# Patient Record
Sex: Female | Born: 1980 | Race: White | Hispanic: No | Marital: Married | Smoking: Former smoker
Health system: Southern US, Community
[De-identification: ages and names within clinical notes are randomized; demographics above are authoritative.]

## PROBLEM LIST (undated history)

## (undated) DIAGNOSIS — G4730 Sleep apnea, unspecified: Secondary | ICD-10-CM

## (undated) DIAGNOSIS — I10 Essential (primary) hypertension: Secondary | ICD-10-CM

## (undated) DIAGNOSIS — E119 Type 2 diabetes mellitus without complications: Secondary | ICD-10-CM

## (undated) DIAGNOSIS — R7303 Prediabetes: Secondary | ICD-10-CM

## (undated) DIAGNOSIS — E785 Hyperlipidemia, unspecified: Secondary | ICD-10-CM

## (undated) DIAGNOSIS — Z9889 Other specified postprocedural states: Secondary | ICD-10-CM

## (undated) DIAGNOSIS — R51 Headache: Secondary | ICD-10-CM

## (undated) DIAGNOSIS — R109 Unspecified abdominal pain: Secondary | ICD-10-CM

## (undated) DIAGNOSIS — R112 Nausea with vomiting, unspecified: Secondary | ICD-10-CM

## (undated) DIAGNOSIS — F32A Depression, unspecified: Secondary | ICD-10-CM

## (undated) DIAGNOSIS — F419 Anxiety disorder, unspecified: Secondary | ICD-10-CM

## (undated) DIAGNOSIS — D759 Disease of blood and blood-forming organs, unspecified: Secondary | ICD-10-CM

## (undated) DIAGNOSIS — E669 Obesity, unspecified: Secondary | ICD-10-CM

## (undated) DIAGNOSIS — T8859XA Other complications of anesthesia, initial encounter: Secondary | ICD-10-CM

## (undated) DIAGNOSIS — D649 Anemia, unspecified: Secondary | ICD-10-CM

## (undated) DIAGNOSIS — F329 Major depressive disorder, single episode, unspecified: Secondary | ICD-10-CM

## (undated) DIAGNOSIS — Z862 Personal history of diseases of the blood and blood-forming organs and certain disorders involving the immune mechanism: Secondary | ICD-10-CM

## (undated) DIAGNOSIS — M1990 Unspecified osteoarthritis, unspecified site: Secondary | ICD-10-CM

## (undated) HISTORY — DX: Hyperlipidemia, unspecified: E78.5

## (undated) HISTORY — PX: WISDOM TOOTH EXTRACTION: SHX21

## (undated) HISTORY — PX: OTHER SURGICAL HISTORY: SHX169

## (undated) HISTORY — DX: Obesity, unspecified: E66.9

## (undated) HISTORY — PX: ANKLE ARTHROSCOPY WITH REPAIR SUBLUXING TENDON: SHX5584

## (undated) HISTORY — PX: ABDOMINAL HYSTERECTOMY: SHX81

## (undated) HISTORY — PX: NASAL SEPTUM SURGERY: SHX37

## (undated) HISTORY — PX: TUBAL LIGATION: SHX77

---
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
## 2000-07-01 ENCOUNTER — Encounter: Payer: Self-pay | Admitting: Emergency Medicine

## 2000-07-01 ENCOUNTER — Emergency Department (HOSPITAL_COMMUNITY): Admission: EM | Admit: 2000-07-01 | Discharge: 2000-07-01 | Payer: Self-pay | Admitting: Emergency Medicine

## 2000-10-11 ENCOUNTER — Encounter: Payer: Self-pay | Admitting: Emergency Medicine

## 2000-10-11 ENCOUNTER — Emergency Department (HOSPITAL_COMMUNITY): Admission: EM | Admit: 2000-10-11 | Discharge: 2000-10-11 | Payer: Self-pay | Admitting: Emergency Medicine

## 2002-05-02 HISTORY — PX: DILATION AND CURETTAGE OF UTERUS: SHX78

## 2003-12-31 ENCOUNTER — Emergency Department (HOSPITAL_COMMUNITY): Admission: EM | Admit: 2003-12-31 | Discharge: 2003-12-31 | Payer: Self-pay | Admitting: Emergency Medicine

## 2004-07-16 ENCOUNTER — Emergency Department (HOSPITAL_COMMUNITY): Admission: EM | Admit: 2004-07-16 | Discharge: 2004-07-16 | Payer: Self-pay | Admitting: Emergency Medicine

## 2004-10-18 ENCOUNTER — Emergency Department (HOSPITAL_COMMUNITY): Admission: EM | Admit: 2004-10-18 | Discharge: 2004-10-18 | Payer: Self-pay | Admitting: Family Medicine

## 2005-11-07 ENCOUNTER — Emergency Department (HOSPITAL_COMMUNITY): Admission: EM | Admit: 2005-11-07 | Discharge: 2005-11-07 | Payer: Self-pay | Admitting: Family Medicine

## 2006-05-02 HISTORY — PX: FOOT SURGERY: SHX648

## 2006-07-09 ENCOUNTER — Emergency Department (HOSPITAL_COMMUNITY): Admission: EM | Admit: 2006-07-09 | Discharge: 2006-07-09 | Payer: Self-pay | Admitting: Emergency Medicine

## 2007-03-04 ENCOUNTER — Emergency Department (HOSPITAL_COMMUNITY): Admission: EM | Admit: 2007-03-04 | Discharge: 2007-03-05 | Payer: Self-pay | Admitting: Emergency Medicine

## 2007-03-21 ENCOUNTER — Emergency Department (HOSPITAL_COMMUNITY): Admission: EM | Admit: 2007-03-21 | Discharge: 2007-03-21 | Payer: Self-pay | Admitting: Emergency Medicine

## 2007-06-18 ENCOUNTER — Emergency Department (HOSPITAL_COMMUNITY): Admission: EM | Admit: 2007-06-18 | Discharge: 2007-06-18 | Payer: Self-pay | Admitting: Family Medicine

## 2007-09-30 ENCOUNTER — Inpatient Hospital Stay (HOSPITAL_COMMUNITY): Admission: AD | Admit: 2007-09-30 | Discharge: 2007-09-30 | Payer: Self-pay | Admitting: Obstetrics

## 2007-10-16 ENCOUNTER — Ambulatory Visit: Payer: Self-pay | Admitting: Oncology

## 2007-11-12 LAB — CBC WITH DIFFERENTIAL/PLATELET
Basophils Absolute: 0.1 10*3/uL (ref 0.0–0.1)
Eosinophils Absolute: 0.1 10*3/uL (ref 0.0–0.5)
HCT: 40.4 % (ref 34.8–46.6)
LYMPH%: 16.3 % (ref 14.0–48.0)
MCV: 79.4 fL — ABNORMAL LOW (ref 81.0–101.0)
MONO%: 4.8 % (ref 0.0–13.0)
NEUT#: 10.5 10*3/uL — ABNORMAL HIGH (ref 1.5–6.5)
NEUT%: 77.7 % — ABNORMAL HIGH (ref 39.6–76.8)
Platelets: 254 10*3/uL (ref 145–400)
RBC: 5.09 10*6/uL (ref 3.70–5.32)

## 2007-11-12 LAB — APTT: aPTT: 34 seconds (ref 24–37)

## 2007-11-15 LAB — COMPREHENSIVE METABOLIC PANEL
ALT: 13 U/L (ref 0–35)
Albumin: 4.1 g/dL (ref 3.5–5.2)
CO2: 22 mEq/L (ref 19–32)
Calcium: 9.1 mg/dL (ref 8.4–10.5)
Chloride: 104 mEq/L (ref 96–112)
Glucose, Bld: 156 mg/dL — ABNORMAL HIGH (ref 70–99)
Potassium: 3.8 mEq/L (ref 3.5–5.3)
Sodium: 140 mEq/L (ref 135–145)
Total Protein: 6.6 g/dL (ref 6.0–8.3)

## 2007-11-15 LAB — SEDIMENTATION RATE: Sed Rate: 10 mm/hr (ref 0–22)

## 2007-11-15 LAB — LUPUS ANTICOAGULANT PANEL: Lupus Anticoagulant: DETECTED

## 2007-11-15 LAB — BETA-2 GLYCOPROTEIN ANTIBODIES
Beta-2 Glyco I IgG: <4 U/mL (ref <20)
Beta-2-Glycoprotein I IgM: <4 U/mL (ref <10)

## 2007-12-14 ENCOUNTER — Ambulatory Visit: Payer: Self-pay | Admitting: Oncology

## 2008-09-05 ENCOUNTER — Emergency Department (HOSPITAL_COMMUNITY): Admission: EM | Admit: 2008-09-05 | Discharge: 2008-09-05 | Payer: Self-pay | Admitting: Emergency Medicine

## 2008-10-31 ENCOUNTER — Ambulatory Visit: Payer: Self-pay | Admitting: Oncology

## 2008-10-31 LAB — CBC WITH DIFFERENTIAL/PLATELET
EOS%: 0.8 % (ref 0.0–7.0)
MCH: 27.2 pg (ref 25.1–34.0)
MCHC: 33.9 g/dL (ref 31.5–36.0)
MCV: 80.3 fL (ref 79.5–101.0)
MONO%: 6.4 % (ref 0.0–14.0)
RBC: 4.8 10*6/uL (ref 3.70–5.45)
RDW: 13.7 % (ref 11.2–14.5)

## 2008-10-31 LAB — COMPREHENSIVE METABOLIC PANEL
AST: 17 U/L (ref 0–37)
Albumin: 3.4 g/dL — ABNORMAL LOW (ref 3.5–5.2)
Alkaline Phosphatase: 57 U/L (ref 39–117)
Potassium: 3.3 mEq/L — ABNORMAL LOW (ref 3.5–5.3)
Sodium: 137 mEq/L (ref 135–145)
Total Protein: 6.3 g/dL (ref 6.0–8.3)

## 2008-10-31 LAB — PROTHROMBIN TIME: Prothrombin Time: 13.3 seconds (ref 11.6–15.2)

## 2008-11-05 LAB — LUPUS ANTICOAGULANT PANEL

## 2008-12-14 ENCOUNTER — Inpatient Hospital Stay (HOSPITAL_COMMUNITY): Admission: AD | Admit: 2008-12-14 | Discharge: 2008-12-14 | Payer: Self-pay | Admitting: Obstetrics & Gynecology

## 2008-12-14 ENCOUNTER — Ambulatory Visit: Payer: Self-pay | Admitting: Family

## 2009-04-10 ENCOUNTER — Inpatient Hospital Stay (HOSPITAL_COMMUNITY): Admission: AD | Admit: 2009-04-10 | Discharge: 2009-04-10 | Payer: Self-pay | Admitting: Obstetrics and Gynecology

## 2009-04-10 ENCOUNTER — Encounter: Payer: Self-pay | Admitting: Pulmonary Disease

## 2009-04-17 ENCOUNTER — Ambulatory Visit: Payer: Self-pay | Admitting: Pulmonary Disease

## 2009-04-17 DIAGNOSIS — R0602 Shortness of breath: Secondary | ICD-10-CM | POA: Insufficient documentation

## 2009-04-17 DIAGNOSIS — O99810 Abnormal glucose complicating pregnancy: Secondary | ICD-10-CM

## 2009-04-21 ENCOUNTER — Encounter: Admission: RE | Admit: 2009-04-21 | Discharge: 2009-04-29 | Payer: Self-pay | Admitting: Obstetrics and Gynecology

## 2009-06-19 ENCOUNTER — Inpatient Hospital Stay (HOSPITAL_COMMUNITY): Admission: AD | Admit: 2009-06-19 | Discharge: 2009-06-23 | Payer: Self-pay | Admitting: Obstetrics and Gynecology

## 2009-11-08 ENCOUNTER — Ambulatory Visit: Payer: Self-pay | Admitting: Nurse Practitioner

## 2009-11-08 ENCOUNTER — Inpatient Hospital Stay (HOSPITAL_COMMUNITY): Admission: AD | Admit: 2009-11-08 | Discharge: 2009-11-08 | Payer: Self-pay | Admitting: Obstetrics and Gynecology

## 2009-12-04 ENCOUNTER — Encounter: Admission: RE | Admit: 2009-12-04 | Discharge: 2009-12-04 | Payer: Self-pay | Admitting: Gastroenterology

## 2010-01-26 ENCOUNTER — Emergency Department (HOSPITAL_COMMUNITY): Admission: EM | Admit: 2010-01-26 | Discharge: 2010-01-26 | Payer: Self-pay | Admitting: Emergency Medicine

## 2010-05-03 ENCOUNTER — Emergency Department (HOSPITAL_COMMUNITY)
Admission: EM | Admit: 2010-05-03 | Discharge: 2010-05-03 | Payer: Self-pay | Source: Home / Self Care | Admitting: Family Medicine

## 2010-07-15 LAB — POCT CARDIAC MARKERS: Troponin i, poc: <0.05 ng/mL (ref 0.00–0.09)

## 2010-07-18 LAB — GC/CHLAMYDIA PROBE AMP, GENITAL: GC Probe Amp, Genital: NEGATIVE

## 2010-07-18 LAB — CBC
HCT: 37.3 % (ref 36.0–46.0)
MCV: 76 fL — ABNORMAL LOW (ref 78.0–100.0)

## 2010-07-18 LAB — WET PREP, GENITAL
Clue Cells Wet Prep HPF POC: NONE SEEN
Trich, Wet Prep: NONE SEEN

## 2010-07-18 LAB — URINALYSIS, ROUTINE W REFLEX MICROSCOPIC
Ketones, ur: NEGATIVE mg/dL
Specific Gravity, Urine: >1.03 — ABNORMAL HIGH (ref 1.005–1.030)
Urobilinogen, UA: 0.2 mg/dL (ref 0.0–1.0)

## 2010-07-18 LAB — POCT PREGNANCY, URINE: Preg Test, Ur: NEGATIVE

## 2010-07-21 LAB — BASIC METABOLIC PANEL
BUN: 14 mg/dL (ref 6–23)
CO2: 20 mEq/L (ref 19–32)
Calcium: 9 mg/dL (ref 8.4–10.5)
Chloride: 110 mEq/L (ref 96–112)
Creatinine, Ser: 0.51 mg/dL (ref 0.4–1.2)
GFR calc Af Amer: >60 mL/min (ref >60)
GFR calc non Af Amer: >60 mL/min (ref >60)
Glucose, Bld: 99 mg/dL (ref 70–99)
Potassium: 4 mEq/L (ref 3.5–5.1)
Sodium: 137 mEq/L (ref 135–145)

## 2010-07-21 LAB — CBC
HCT: 40.7 % (ref 36.0–46.0)
Hemoglobin: 13.4 g/dL (ref 12.0–15.0)
MCHC: 32.8 g/dL (ref 30.0–36.0)
MCV: 81.7 fL (ref 78.0–100.0)
Platelets: 136 10*3/uL — ABNORMAL LOW (ref 150–400)
Platelets: 197 10*3/uL (ref 150–400)
RBC: 4.09 MIL/uL (ref 3.87–5.11)
RBC: 4.98 MIL/uL (ref 3.87–5.11)
RDW: 15.5 % (ref 11.5–15.5)
WBC: 11.5 10*3/uL — ABNORMAL HIGH (ref 4.0–10.5)
WBC: 9.8 10*3/uL (ref 4.0–10.5)

## 2010-07-21 LAB — GLUCOSE, CAPILLARY
Glucose-Capillary: 76 mg/dL (ref 70–99)
Glucose-Capillary: 88 mg/dL (ref 70–99)
Glucose-Capillary: 91 mg/dL (ref 70–99)
Glucose-Capillary: 93 mg/dL (ref 70–99)

## 2010-07-21 LAB — RPR: RPR Ser Ql: NONREACTIVE

## 2010-08-03 LAB — COMPREHENSIVE METABOLIC PANEL
ALT: 19 U/L (ref 0–35)
Alkaline Phosphatase: 89 U/L (ref 39–117)
CO2: 22 mEq/L (ref 19–32)
Chloride: 103 mEq/L (ref 96–112)
GFR calc non Af Amer: >60 mL/min (ref >60)
Glucose, Bld: 86 mg/dL (ref 70–99)
Potassium: 3.7 mEq/L (ref 3.5–5.1)
Sodium: 136 mEq/L (ref 135–145)
Total Bilirubin: 0.2 mg/dL — ABNORMAL LOW (ref 0.3–1.2)
Total Protein: 6.1 g/dL (ref 6.0–8.3)

## 2010-08-03 LAB — CBC
HCT: 35.9 % — ABNORMAL LOW (ref 36.0–46.0)
Hemoglobin: 11.9 g/dL — ABNORMAL LOW (ref 12.0–15.0)
RBC: 4.35 MIL/uL (ref 3.87–5.11)

## 2010-08-03 LAB — D-DIMER, QUANTITATIVE: D-Dimer, Quant: 0.6 ug/mL-FEU — ABNORMAL HIGH (ref 0.00–0.48)

## 2010-08-06 ENCOUNTER — Emergency Department (HOSPITAL_COMMUNITY)
Admission: EM | Admit: 2010-08-06 | Discharge: 2010-08-06 | Disposition: A | Discharge status: Home / Self Care | Payer: BC Managed Care – PPO | Attending: Emergency Medicine | Admitting: Emergency Medicine

## 2010-08-06 DIAGNOSIS — K439 Ventral hernia without obstruction or gangrene: Secondary | ICD-10-CM | POA: Insufficient documentation

## 2010-08-06 DIAGNOSIS — R1033 Periumbilical pain: Secondary | ICD-10-CM | POA: Insufficient documentation

## 2010-08-06 LAB — URINALYSIS, ROUTINE W REFLEX MICROSCOPIC
Bilirubin Urine: NEGATIVE
Glucose, UA: NEGATIVE mg/dL
Hgb urine dipstick: NEGATIVE
Protein, ur: NEGATIVE mg/dL

## 2010-08-10 LAB — COMPREHENSIVE METABOLIC PANEL
Albumin: 3.3 g/dL — ABNORMAL LOW (ref 3.5–5.2)
BUN: 5 mg/dL — ABNORMAL LOW (ref 6–23)
CO2: 26 mEq/L (ref 19–32)
Chloride: 110 mEq/L (ref 96–112)
Creatinine, Ser: 0.65 mg/dL (ref 0.4–1.2)
GFR calc non Af Amer: >60 mL/min (ref >60)
Total Bilirubin: 0.4 mg/dL (ref 0.3–1.2)

## 2010-08-10 LAB — URINALYSIS, ROUTINE W REFLEX MICROSCOPIC
Nitrite: NEGATIVE
Specific Gravity, Urine: 1.019 (ref 1.005–1.030)
pH: 7 (ref 5.0–8.0)

## 2010-08-10 LAB — CBC
HCT: 39.6 % (ref 36.0–46.0)
MCV: 81 fL (ref 78.0–100.0)
Platelets: 203 10*3/uL (ref 150–400)
WBC: 9.6 10*3/uL (ref 4.0–10.5)

## 2010-08-10 LAB — DIFFERENTIAL
Basophils Absolute: 0.1 10*3/uL (ref 0.0–0.1)
Lymphocytes Relative: 21 % (ref 12–46)
Neutro Abs: 6.8 10*3/uL (ref 1.7–7.7)

## 2010-08-10 LAB — POCT CARDIAC MARKERS: Myoglobin, poc: 42.9 ng/mL (ref 12–200)

## 2010-08-12 ENCOUNTER — Other Ambulatory Visit: Payer: Self-pay | Admitting: General Surgery

## 2010-08-17 ENCOUNTER — Ambulatory Visit
Admission: RE | Admit: 2010-08-17 | Discharge: 2010-08-17 | Disposition: A | Discharge status: Home / Self Care | Payer: BC Managed Care – PPO | Source: Ambulatory Visit | Attending: General Surgery | Admitting: General Surgery

## 2010-08-17 MED ORDER — IOHEXOL 300 MG/ML  SOLN
150.0000 mL | Freq: Once | INTRAMUSCULAR | Status: AC | PRN
  Administered 2010-08-17: 150 mL via INTRAVENOUS

## 2010-08-25 LAB — CBC
MCH: 23.8 pg — ABNORMAL LOW (ref 26.0–34.0)
MCV: 72.6 fL — ABNORMAL LOW (ref 78.0–100.0)
Platelets: 259 10*3/uL (ref 150–400)
RBC: 5.43 MIL/uL — ABNORMAL HIGH (ref 3.87–5.11)

## 2010-08-25 LAB — DIFFERENTIAL
Eosinophils Absolute: 0.3 10*3/uL (ref 0.0–0.7)
Eosinophils Relative: 2 % (ref 0–5)
Lymphs Abs: 2.6 10*3/uL (ref 0.7–4.0)
Monocytes Relative: 6 % (ref 3–12)
Neutrophils Relative %: 76 % (ref 43–77)

## 2010-08-25 LAB — BASIC METABOLIC PANEL
BUN: 7 mg/dL (ref 6–23)
Chloride: 106 mEq/L (ref 96–112)
Creatinine, Ser: 0.66 mg/dL (ref 0.4–1.2)
Glucose, Bld: 110 mg/dL — ABNORMAL HIGH (ref 70–99)

## 2010-08-25 LAB — HCG, SERUM, QUALITATIVE: Preg, Serum: NEGATIVE

## 2010-08-27 ENCOUNTER — Encounter (HOSPITAL_COMMUNITY)
Admission: RE | Admit: 2010-08-27 | Discharge: 2010-08-27 | Disposition: A | Discharge status: Home / Self Care | Payer: BC Managed Care – PPO | Source: Ambulatory Visit | Attending: General Surgery | Admitting: General Surgery

## 2010-08-27 DIAGNOSIS — Z01812 Encounter for preprocedural laboratory examination: Secondary | ICD-10-CM | POA: Insufficient documentation

## 2010-08-27 LAB — CBC
HCT: 40.6 % (ref 36.0–46.0)
Hemoglobin: 12.4 g/dL (ref 12.0–15.0)
MCH: 22.1 pg — ABNORMAL LOW (ref 26.0–34.0)
MCV: 72.5 fL — ABNORMAL LOW (ref 78.0–100.0)
Platelets: 288 10*3/uL (ref 150–400)
RBC: 5.6 MIL/uL — ABNORMAL HIGH (ref 3.87–5.11)

## 2010-08-27 LAB — URINALYSIS, ROUTINE W REFLEX MICROSCOPIC
Bilirubin Urine: NEGATIVE
Glucose, UA: NEGATIVE mg/dL
Hgb urine dipstick: NEGATIVE
Ketones, ur: NEGATIVE mg/dL
Protein, ur: NEGATIVE mg/dL
pH: 6 (ref 5.0–8.0)

## 2010-08-28 LAB — URINE CULTURE
Colony Count: >=100000
Culture  Setup Time: 201204271233

## 2010-08-30 ENCOUNTER — Ambulatory Visit (HOSPITAL_COMMUNITY): Admission: RE | Admit: 2010-08-30 | Payer: BC Managed Care – PPO | Source: Ambulatory Visit | Admitting: General Surgery

## 2010-09-02 ENCOUNTER — Other Ambulatory Visit (HOSPITAL_COMMUNITY): Payer: Self-pay | Admitting: Oncology

## 2010-09-02 ENCOUNTER — Encounter (HOSPITAL_BASED_OUTPATIENT_CLINIC_OR_DEPARTMENT_OTHER): Payer: BC Managed Care – PPO | Admitting: Oncology

## 2010-09-02 ENCOUNTER — Ambulatory Visit
Admission: RE | Admit: 2010-09-02 | Discharge: 2010-09-02 | Disposition: A | Discharge status: Home / Self Care | Payer: BC Managed Care – PPO | Source: Ambulatory Visit | Attending: Family Medicine | Admitting: Family Medicine

## 2010-09-02 ENCOUNTER — Other Ambulatory Visit: Payer: Self-pay | Admitting: Family Medicine

## 2010-09-02 DIAGNOSIS — D72829 Elevated white blood cell count, unspecified: Secondary | ICD-10-CM

## 2010-09-02 DIAGNOSIS — N96 Recurrent pregnancy loss: Secondary | ICD-10-CM

## 2010-09-02 DIAGNOSIS — R0609 Other forms of dyspnea: Secondary | ICD-10-CM

## 2010-09-02 DIAGNOSIS — D6859 Other primary thrombophilia: Secondary | ICD-10-CM

## 2010-09-02 LAB — COMPREHENSIVE METABOLIC PANEL
Alkaline Phosphatase: 87 U/L (ref 39–117)
CO2: 20 mEq/L (ref 19–32)
Creatinine, Ser: 0.69 mg/dL (ref 0.40–1.20)
Glucose, Bld: 146 mg/dL — ABNORMAL HIGH (ref 70–99)
Total Bilirubin: 0.3 mg/dL (ref 0.3–1.2)

## 2010-09-02 LAB — LACTATE DEHYDROGENASE: LDH: 146 U/L (ref 94–250)

## 2010-09-02 LAB — CBC WITH DIFFERENTIAL/PLATELET
BASO%: 0.1 % (ref 0.0–2.0)
Eosinophils Absolute: 0.2 10*3/uL (ref 0.0–0.5)
LYMPH%: 13.2 % — ABNORMAL LOW (ref 14.0–49.7)
MCHC: 32.4 g/dL (ref 31.5–36.0)
MONO#: 0.7 10*3/uL (ref 0.1–0.9)
MONO%: 4.5 % (ref 0.0–14.0)
NEUT#: 13 10*3/uL — ABNORMAL HIGH (ref 1.5–6.5)
Platelets: 280 10*3/uL (ref 145–400)
RBC: 5.37 10*6/uL (ref 3.70–5.45)
RDW: 16.9 % — ABNORMAL HIGH (ref 11.2–14.5)
WBC: 16 10*3/uL — ABNORMAL HIGH (ref 3.9–10.3)

## 2010-09-02 LAB — CHCC SMEAR

## 2010-09-02 LAB — MORPHOLOGY: PLT EST: ADEQUATE

## 2010-09-04 LAB — IRON AND TIBC
%SAT: 5 % — ABNORMAL LOW (ref 20–55)
Iron: 16 ug/dL — ABNORMAL LOW (ref 42–145)
TIBC: 320 ug/dL (ref 250–470)
UIBC: 304 ug/dL

## 2010-09-04 LAB — FERRITIN: Ferritin: 20 ng/mL (ref 10–291)

## 2010-09-13 ENCOUNTER — Encounter (HOSPITAL_BASED_OUTPATIENT_CLINIC_OR_DEPARTMENT_OTHER): Payer: BC Managed Care – PPO | Admitting: Oncology

## 2010-09-13 DIAGNOSIS — D72829 Elevated white blood cell count, unspecified: Secondary | ICD-10-CM

## 2010-09-13 DIAGNOSIS — N96 Recurrent pregnancy loss: Secondary | ICD-10-CM

## 2010-09-14 ENCOUNTER — Other Ambulatory Visit: Payer: Self-pay | Admitting: General Surgery

## 2010-09-14 ENCOUNTER — Encounter (HOSPITAL_COMMUNITY): Payer: BC Managed Care – PPO

## 2010-09-14 LAB — BASIC METABOLIC PANEL
BUN: 11 mg/dL (ref 6–23)
CO2: 23 mEq/L (ref 19–32)
Calcium: 8.5 mg/dL (ref 8.4–10.5)
Creatinine, Ser: 0.55 mg/dL (ref 0.4–1.2)
Glucose, Bld: 97 mg/dL (ref 70–99)
Sodium: 138 mEq/L (ref 135–145)

## 2010-09-14 LAB — CBC
HCT: 40 % (ref 36.0–46.0)
MCH: 21.9 pg — ABNORMAL LOW (ref 26.0–34.0)
MCHC: 30 g/dL (ref 30.0–36.0)
MCV: 72.9 fL — ABNORMAL LOW (ref 78.0–100.0)
RDW: 16.3 % — ABNORMAL HIGH (ref 11.5–15.5)

## 2010-09-14 LAB — DIFFERENTIAL
Basophils Absolute: 0 10*3/uL (ref 0.0–0.1)
Lymphs Abs: 2.1 10*3/uL (ref 0.7–4.0)
Monocytes Absolute: 0.7 10*3/uL (ref 0.1–1.0)
Monocytes Relative: 5 % (ref 3–12)
Neutro Abs: 10 10*3/uL — ABNORMAL HIGH (ref 1.7–7.7)

## 2010-09-14 LAB — SURGICAL PCR SCREEN: Staphylococcus aureus: NEGATIVE

## 2010-09-20 ENCOUNTER — Ambulatory Visit (HOSPITAL_COMMUNITY)
Admission: RE | Admit: 2010-09-20 | Discharge: 2010-09-22 | Disposition: A | Discharge status: Home / Self Care | Payer: BC Managed Care – PPO | Source: Ambulatory Visit | Attending: General Surgery | Admitting: General Surgery

## 2010-09-20 DIAGNOSIS — F3289 Other specified depressive episodes: Secondary | ICD-10-CM | POA: Insufficient documentation

## 2010-09-20 DIAGNOSIS — F329 Major depressive disorder, single episode, unspecified: Secondary | ICD-10-CM | POA: Insufficient documentation

## 2010-09-20 DIAGNOSIS — Z79899 Other long term (current) drug therapy: Secondary | ICD-10-CM | POA: Insufficient documentation

## 2010-09-20 DIAGNOSIS — K439 Ventral hernia without obstruction or gangrene: Secondary | ICD-10-CM | POA: Insufficient documentation

## 2010-09-20 HISTORY — PX: VENTRAL HERNIA REPAIR: SHX424

## 2010-09-22 LAB — CBC
Hemoglobin: 11.3 g/dL — ABNORMAL LOW (ref 12.0–15.0)
MCH: 21.9 pg — ABNORMAL LOW (ref 26.0–34.0)
RBC: 5.15 MIL/uL — ABNORMAL HIGH (ref 3.87–5.11)

## 2010-09-22 LAB — BASIC METABOLIC PANEL
CO2: 28 mEq/L (ref 19–32)
Chloride: 105 mEq/L (ref 96–112)
Creatinine, Ser: 0.7 mg/dL (ref 0.4–1.2)
GFR calc Af Amer: >60 mL/min (ref >60)
Sodium: 139 mEq/L (ref 135–145)

## 2010-09-24 NOTE — Op Note (Signed)
NAMELUX, SKILTON NO.:  [id number]  MEDICAL RECORD NO.:  [id number]           PATIENT TYPE:  O  LOCATION:  1533                         FACILITY:  [facility]  PHYSICIAN:  Mary Sella. Andrey Campanile, MD     DATE OF BIRTH:  Feb 14, 1981  DATE OF PROCEDURE:  09/20/2010 DATE OF DISCHARGE:                              OPERATIVE REPORT   PREOPERATIVE DIAGNOSIS:  Ventral hernia.  POSTOPERATIVE DIAGNOSIS:  Ventral hernia.  PROCEDURE: 1. Laparoscopic ventral hernia repair with mesh. 2. Placed On-Q pain catheter.  SURGEON:  Mary Sella. Andrey Campanile, MD  ANESTHESIA:  General plus 15 cc of 0.25% Marcaine.  FINDINGS:  The patient had a fascial defect just above the level of the umbilicus, the inferior edge of the fascial defect included a portion of the umbilicus.  Therefore, I included the umbilicus as part of my fascial defect measurement.  The fascial defect measured 5 x 3.5. I then measured 5 cm circumferential to this to get an outline for the proposed mesh size.  The best size piece of mesh that would have accommodated the defect once added 5 cm circumferentially around it for overlap would be a 15 x 20 cm piece of Parietex.  There were 12 transfascial sutures.  I used Ethicon Secure Strap to secure the mesh to the abdominal wall as well.    EBL, minimal.  INDICATIONS FOR PROCEDURE:  The patient is a 30 year old morbidly obese Caucasian female who has been referred from the emergency room for evaluation of umbilical hernia.  On exam, it felt that it was small supraumbilical hernia.  Because of her abdominal size and morbid obesity, it was tough to delineate the edges of the fascial defect. Therefore, I obtained a CT scan which demonstrated a small supraumbilical hernia which measured about 3-4 cm on the CAT scan.  Her preoperative labs revealed a leukocytosis of around 18,000.  There were no signs of infection and there was just some omental fat within the hernia.  Therefore, she  underwent an outpatient workup for leukocytosis and has subsequently been cleared for surgery.  Her white count preoperatively was found to be 13.  We discussed the risks and benefits of surgery including bleeding, infection, injury to surrounding structures, DVT occurrence, hernia reoccurrence, prolonged abdominal wall pain due to the nature and how the mesh is anchored to the abdominal wall mesh, complications, urinary retention as well as wound infection.  She elected to proceed with surgery.  DESCRIPTION OF PROCEDURE:  After obtaining informed consent, the patient was brought to the operating room, placed supine on the operating table. General endotracheal anesthesia was established.  Sequential compression devices were placed, a Foley catheter was placed.  She received IV Tylenol as well as 2 g of Ancef prior to skin incision.  A surgical time- out was performed.  Her abdomen was then prepped and draped in usual standard surgical fashion with ChloraPrep.  I elected to gain entry to her abdomen using Optiview technique, starting 2 fingerbreadths below the left subcostal margin slightly to the left of the midclavicular line.  I made a 1 cm incision with  a #11 blade.  Then, using a 0 degree 5 mm scope through a 5 mm trocar, I advanced the trocar through all layers of the abdominal wall under direct visualization and smoothly entered the abdominal cavity. Pneumoperitoneum was established up to patient pressure of 15 mmHg. Laparoscope scope was advanced, the abdominal cavity was surveilled. She had a little bit of omental adhesions to her lower midline probably about 2 inches above her Pfannenstiel incision.  She had the umbilicus, then the fascial defect, as described above.  She had a very long falciform ligament.  I ended up placing another 11 mm trocar in the left midabdomen under direct visualization out laterally after local had been infiltrated.  Then using Harmonic scalpel, I  have lysed the adhesions, which were consisted of omentum to anterior uterine wall and the lower abdomen.  I then took down the falciform ligament in a serial fashion using the Harmonic scalpel.  Then using a spinal needle, I marked the boundaries of the fascial defect.  Once I decided to incorporate the umbilicus into the fascial defect because the fascia was somewhat thin there.  Again, the fascial defect of about 5 x 3.5 cm then measuring 5 cm circumferentially around that to get a 5 cm overlap and left in with about a 15 x 12 needed piece of mesh.  I, therefore, obtained a 15 x 20 cm piece of Parietex mesh. I placed 12 sutures, consisting of #1 Novafil through the Parietex leaving long tails.  I then rolled the Parietex like a cigar and put it through the 11 mm trocar and then rolled it into the abdominal cavity with appropriate orientation.  Then using a Storz Endo suture passer, I brought each of the tails of the suture that I had placed through the mesh up through the abdominal wall through the same skin defect, but through a separate fascial defect which was a couple of millimeters from each other.  This was done in a circumferential manner for the 12 transfascial sutures.  I then lifted up and snugged down all the 12 transfascial sutures against the abdominal wall.  The mesh was well seated against the abdominal wall.  There was very little redundancy.  I then tied down the transfascial sutures in a circumferential manner.  I then used almost a total of 2 Ethicon Secure Strap tackers to tack the periphery of the mesh to the abdominal wall placing a tack about every 1 cm.  I also placed a few tacks in the center, directly around the fascial defect around the umbilicus itself. I then placed two 7 inches On-Q pain catheters.  I made a small stab incision in the right upper quadrant and then tunneled it preperitoneal using laparoscopic guidance.  The sheath was left in place.  I  then removed the 5 mm trocar in the left upper quadrant and tunneled it through the skin defect that I made for the 5 mm trocar toward the left lower quadrant.  The catheters remain preperitoneal.  The sheath was left in place.  Pneumoperitoneum was released and the 11 mm trocar was removed.  I then placed each of the 7 inch pain catheters through their respective sheaths and then pulled the sheath apart leaving the pain catheter in place.  I closed the 11 mm trocar site with a #4-0 Monocryl in subcuticular fashion.  I placed a small 4-0 Monocryl in subcuticular fashion in left upper quadrant trocar site, just next to the On-Q pain catheter at  that site.  Dermabond was then applied to each of the 12 small nicks that had been made in an oval pattern on the patient's abdominal wall for the transvaginal sutures.  The On-Q pain catheters were secured to the skin with Steri-Strips.  Foley catheter was removed.  The patient was extubated and brought to the recovery room in stable condition.  There were no immediate complications.  The patient tolerated the procedure well.     Mary Sella. Andrey Campanile, MD     EMW/MEDQ  D:  09/20/2010  T:  09/21/2010  Job:  [id number]  cc:   Burnell Blanks, MD Fax: [phone]  Lenoard Aden, M.D. Fax: [phone]  Electronically Signed by Gaynelle Adu M.D. on 09/24/2010 09:21:36 AM

## 2010-09-29 ENCOUNTER — Encounter (INDEPENDENT_AMBULATORY_CARE_PROVIDER_SITE_OTHER): Payer: Self-pay | Admitting: General Surgery

## 2010-11-02 ENCOUNTER — Other Ambulatory Visit (HOSPITAL_COMMUNITY): Payer: Self-pay | Admitting: Oncology

## 2010-11-02 DIAGNOSIS — D539 Nutritional anemia, unspecified: Secondary | ICD-10-CM

## 2010-11-02 DIAGNOSIS — D6859 Other primary thrombophilia: Secondary | ICD-10-CM

## 2010-11-12 ENCOUNTER — Ambulatory Visit (INDEPENDENT_AMBULATORY_CARE_PROVIDER_SITE_OTHER): Payer: BC Managed Care – PPO | Admitting: General Surgery

## 2010-11-12 ENCOUNTER — Encounter (INDEPENDENT_AMBULATORY_CARE_PROVIDER_SITE_OTHER): Payer: Self-pay | Admitting: General Surgery

## 2010-11-12 DIAGNOSIS — K439 Ventral hernia without obstruction or gangrene: Secondary | ICD-10-CM

## 2010-11-12 NOTE — Progress Notes (Signed)
Procedure: Laparoscopic ventral hernia repair with mesh Sep 20, 2010  History of Present Ilness: The patient comes in today for her second postoperative visit. Since her last visit she states that she is returned to work and she's actually doing quite well. She reports minimal to no abdominal discomfort. She will occasionally have a sharp pain if she twists real suddenly. However that occurs very infrequently. She denies any nausea, vomiting, fever, or chills. Her only issue has been persistent loose stools. She has not tried anything to treat this.  Physical Exam: There were no vitals taken for this visit.  Well-developed well-nourished morbidly obese Caucasian female in no apparent distress Abdomen-soft, nontender, nondistended. Obese abdomen. Well healed trocar incisions. No evidence of hernia recurrence.  Pathology: N/A  Assessment and Plan: Status post laparoscopic ventral hernia repair with mesh doing well. I am not sure the etiology of her loose stools. However I do not think it warrants invasive imaging or laboratory evaluation at this time. I recommended that she had some fiber to her diet. We discussed these that were high in fiber. We also talked about supplemental fiber. She was given education material. I also recommended some occasional Imodium. I will see her in 6 weeks. I told her to call the office should her symptoms worsen.

## 2010-11-12 NOTE — Patient Instructions (Signed)
GETTING TO GOOD BOWEL HEALTH. Irregular bowel habits such as constipation and diarrhea can lead to many problems over time.  Having one soft bowel movement a day is the most important way to prevent further problems.  The anorectal canal is designed to handle stretching and feces to safely manage our ability to get rid of solid waste (feces, poop, stool) out of our body.  BUT, hard constipated stools can act like ripping concrete bricks and diarrhea can be a burning fire to this very sensitive area of our body, causing inflamed hemorrhoids, anal fissures, increasing risk is perirectal abscesses, abdominal pain/bloating, an making irritable bowel worse.     The goal: ONE SOFT BOWEL MOVEMENT A DAY!  To have soft, regular bowel movements:    Drink at least 8 tall glasses of water a day.     Take plenty of fiber.  Fiber is the undigested part of plant food that passes into the colon, acting s "natures broom" to encourage bowel motility and movement.  Fiber can absorb and hold large amounts of water. This results in a larger, bulkier stool, which is soft and easier to pass. Work gradually over several weeks up to 6 servings a day of fiber (25g a day even more if needed) in the form of: o Vegetables -- Root (potatoes, carrots, turnips), leafy green (lettuce, salad greens, celery, spinach), or cooked high residue (cabbage, broccoli, etc) o Fruit -- Fresh (unpeeled skin & pulp), Dried (prunes, apricots, cherries, etc ),  or stewed ( applesauce)  o Whole grain breads, pasta, etc (whole wheat)  o Bran cereals    Bulking Agents -- This type of water-retaining fiber generally is easily obtained each day by one of the following:  o Psyllium bran -- The psyllium plant is remarkable because its ground seeds can retain so much water. This product is available as Metamucil, Konsyl, Effersyllium, Per Diem Fiber, or the less expensive generic preparation in drug and health food stores. Although labeled a laxative, it really  is not a laxative.  o Methylcellulose -- This is another fiber derived from wood which also retains water. It is available as Citrucel. o Polyethylene Glycol - and "artificial" fiber commonly called Miralax or Glycolax.  It is helpful for people with gassy or bloated feelings with regular fiber o Flax Seed - a less gassy fiber than psyllium   No reading or other relaxing activity while on the toilet. If bowel movements take longer than 5 minutes, you are too constipated   AVOID CONSTIPATION.  High fiber and water intake usually takes care of this.  Sometimes a laxative is needed to stimulate more frequent bowel movements, but    Laxatives are not a good long-term solution as it can wear the colon out. o Osmotics (Milk of Magnesia, Fleets phosphosoda, Magnesium citrate, MiraLax, GoLytely) are safer than  o Stimulants (Senokot, Castor Oil, Dulcolax, Ex Lax)    o Do not take laxatives for more than 7days in a row.    IF SEVERELY CONSTIPATED, try a Bowel Retraining Program: o Do not use laxatives.  o Eat a diet high in roughage, such as bran cereals and leafy vegetables.  o Drink six (6) ounces of prune or apricot juice each morning.  o Eat two (2) large servings of stewed fruit each day.  o Take one (1) heaping tablespoon of a psyllium-based bulking agent twice a day. Use sugar-free sweetener when possible to avoid excessive calories.  o Eat a normal breakfast.  o   Set aside 15 minutes after breakfast to sit on the toilet, but do not strain to have a bowel movement.  o If you do not have a bowel movement by the third day, use an enema and repeat the above steps.    Controlling diarrhea o Switch to liquids and simpler foods for a few days to avoid stressing your intestines further. o Avoid dairy products (especially milk & ice cream) for a short time.  The intestines often can lose the ability to digest lactose when stressed. o Avoid foods that cause gassiness or bloating.  Typical foods include  beans and other legumes, cabbage, broccoli, and dairy foods.  Every person has some sensitivity to other foods, so listen to our body and avoid those foods that trigger problems for you. o Adding fiber (Citrucel, Metamucil, psyllium, Miralax) gradually can help thicken stools by absorbing excess fluid and retrain the intestines to act more normally.  Slowly increase the dose over a few weeks.  Too much fiber too soon can backfire and cause cramping & bloating. o Probiotics (such as active yogurt, Align, etc) may help repopulate the intestines and colon with normal bacteria and calm down a sensitive digestive tract.  Most studies show it to be of mild help, though, and such products can be costly. o Medicines:   Bismuth subsalicylate (ex. Kayopectate, Pepto Bismol) every 30 minutes for up to 6 doses can help control diarrhea.  Avoid if pregnant.   Loperamide (Immodium) can slow down diarrhea.  Start with two tablets (4mg total) first and then try one tablet every 6 hours.  Avoid if you are having fevers or severe pain.  If you are not better or start feeling worse, stop all medicines and call your doctor for advice o Call your doctor if you are getting worse or not better.  Sometimes further testing (cultures, endoscopy, X-ray studies, bloodwork, etc) may be needed to help diagnose and treat the cause of the diarrhea.' 

## 2010-11-17 ENCOUNTER — Emergency Department (HOSPITAL_COMMUNITY): Payer: BC Managed Care – PPO

## 2010-11-17 ENCOUNTER — Emergency Department (HOSPITAL_COMMUNITY)
Admission: EM | Admit: 2010-11-17 | Discharge: 2010-11-17 | Disposition: A | Discharge status: Home / Self Care | Payer: BC Managed Care – PPO | Attending: Emergency Medicine | Admitting: Emergency Medicine

## 2010-11-17 DIAGNOSIS — D72829 Elevated white blood cell count, unspecified: Secondary | ICD-10-CM | POA: Insufficient documentation

## 2010-11-17 DIAGNOSIS — F3289 Other specified depressive episodes: Secondary | ICD-10-CM | POA: Insufficient documentation

## 2010-11-17 DIAGNOSIS — F329 Major depressive disorder, single episode, unspecified: Secondary | ICD-10-CM | POA: Insufficient documentation

## 2010-11-17 DIAGNOSIS — R0789 Other chest pain: Secondary | ICD-10-CM | POA: Insufficient documentation

## 2010-11-17 DIAGNOSIS — R5381 Other malaise: Secondary | ICD-10-CM | POA: Insufficient documentation

## 2010-11-17 DIAGNOSIS — R55 Syncope and collapse: Secondary | ICD-10-CM | POA: Insufficient documentation

## 2010-11-17 DIAGNOSIS — R112 Nausea with vomiting, unspecified: Secondary | ICD-10-CM | POA: Insufficient documentation

## 2010-11-17 DIAGNOSIS — R42 Dizziness and giddiness: Secondary | ICD-10-CM | POA: Insufficient documentation

## 2010-11-17 DIAGNOSIS — Z79899 Other long term (current) drug therapy: Secondary | ICD-10-CM | POA: Insufficient documentation

## 2010-11-17 LAB — BASIC METABOLIC PANEL
CO2: 25 mEq/L (ref 19–32)
Calcium: 9.3 mg/dL (ref 8.4–10.5)
Chloride: 102 mEq/L (ref 96–112)
Sodium: 138 mEq/L (ref 135–145)

## 2010-11-17 LAB — CBC
Hemoglobin: 11.8 g/dL — ABNORMAL LOW (ref 12.0–15.0)
MCH: 22.2 pg — ABNORMAL LOW (ref 26.0–34.0)
MCV: 70.6 fL — ABNORMAL LOW (ref 78.0–100.0)
RBC: 5.31 MIL/uL — ABNORMAL HIGH (ref 3.87–5.11)

## 2010-11-17 LAB — TROPONIN I: Troponin I: <0.3 ng/mL (ref <0.30)

## 2010-11-17 LAB — DIFFERENTIAL
Lymphs Abs: 2.9 10*3/uL (ref 0.7–4.0)
Monocytes Relative: 5 % (ref 3–12)
Neutro Abs: 15.1 10*3/uL — ABNORMAL HIGH (ref 1.7–7.7)
Neutrophils Relative %: 78 % — ABNORMAL HIGH (ref 43–77)

## 2010-11-17 LAB — D-DIMER, QUANTITATIVE: D-Dimer, Quant: 0.22 ug/mL-FEU (ref 0.00–0.48)

## 2010-11-17 LAB — URINALYSIS, ROUTINE W REFLEX MICROSCOPIC
Bilirubin Urine: NEGATIVE
Ketones, ur: NEGATIVE mg/dL
Nitrite: NEGATIVE
Specific Gravity, Urine: 1.025 (ref 1.005–1.030)
Urobilinogen, UA: 1 mg/dL (ref 0.0–1.0)

## 2010-11-17 LAB — PREGNANCY, URINE: Preg Test, Ur: NEGATIVE

## 2010-12-17 ENCOUNTER — Ambulatory Visit (HOSPITAL_BASED_OUTPATIENT_CLINIC_OR_DEPARTMENT_OTHER): Payer: BC Managed Care – PPO | Attending: Family Medicine

## 2010-12-17 DIAGNOSIS — R0989 Other specified symptoms and signs involving the circulatory and respiratory systems: Secondary | ICD-10-CM | POA: Insufficient documentation

## 2010-12-17 DIAGNOSIS — R0609 Other forms of dyspnea: Secondary | ICD-10-CM | POA: Insufficient documentation

## 2010-12-17 DIAGNOSIS — G4733 Obstructive sleep apnea (adult) (pediatric): Secondary | ICD-10-CM | POA: Insufficient documentation

## 2010-12-23 ENCOUNTER — Encounter (INDEPENDENT_AMBULATORY_CARE_PROVIDER_SITE_OTHER): Payer: BC Managed Care – PPO | Admitting: General Surgery

## 2010-12-25 DIAGNOSIS — R0989 Other specified symptoms and signs involving the circulatory and respiratory systems: Secondary | ICD-10-CM

## 2010-12-25 DIAGNOSIS — G4733 Obstructive sleep apnea (adult) (pediatric): Secondary | ICD-10-CM

## 2010-12-25 DIAGNOSIS — R0609 Other forms of dyspnea: Secondary | ICD-10-CM

## 2010-12-25 NOTE — Procedures (Signed)
Kathleen Walsh, Kathleen Walsh NO.:  [id number]  MEDICAL RECORD NO.:  [id number]          PATIENT TYPE:  OUT  LOCATION:  SLEEP CENTER                 FACILITY:  [facility]  PHYSICIAN:   D. Maple Hudson, MD, FCCP, FACPDATE OF BIRTH:  03-25-1981  DATE OF STUDY:  12/17/2010                           NOCTURNAL POLYSOMNOGRAM  REFERRING PHYSICIAN:  MAURA L HAMRICK  INDICATION FOR STUDY:  Hypersomnia with sleep apnea.  EPWORTH SLEEPINESS SCORE:  10/24, BMI of 50.8, weight 315 pounds, height 66 inches.  Neck 16 inches.  MEDICATIONS:  Home medications are charted and reviewed.  SLEEP ARCHITECTURE:  Split study protocol.  During the diagnostic phase, total sleep time 122.5 minutes with sleep efficiency 87.2%.  Stage I was 11%, stage II 76.7%, stage III 1.2%, REM 11% of total sleep time.  Sleep latency 5 minutes, REM latency 122 minutes, awake after sleep onset 13 minutes, arousal index 106.3 indicating increased EEG arousal.  No bedtime medication taken.  RESPIRATORY DATA:  Split study protocol.  Apnea/hypopnea index (AHI) 99.4 per hour.  A total of 203 events was scored including 45 obstructive apneas, 2 central apneas, 156 hypopneas.  Events were not positional.  REM AHI 13.3 per hour.  CPAP was then titrated to 11 CWP, AHI 6.3 per hour.  She wore a medium ResMed Quattro Fx full-face mask with heated humidifier.  OXYGEN DATA:  Before CPAP snoring was moderately loud with oxygen desaturation to a nadir of 82% on room air.  With CPAP titration, mean oxygen saturation held 96.3% on room air and snoring was prevented.  CARDIAC DATA:  Sinus rhythm with rare PVC.  MOVEMENT-PARASOMNIA:  No significant movement disturbance.  Bathroom x1.  IMPRESSIONS-RECOMMENDATIONS: 1. Severe obstructive sleep apnea/hypopnea syndrome, AHI 99.4 per     hour.  Moderately loud snoring with oxygen desaturation to a nadir     of 82% on room air. 2. Successful CPAP titration to 11 CWP, AHI 6.3 per hour.   She wore a     medium ResMed Quattro Fx full-face mask with heated humidifier.     Snoring was prevented and oxygenation normalized.      D. Maple Hudson, MD, [facility], FACP Diplomate, Biomedical engineer of Sleep Medicine Electronically Signed    CDY/MEDQ  D:  12/25/2010 13:09:29  T:  12/25/2010 19:12:15  Job:  [id number]

## 2011-01-26 LAB — CBC
HCT: 40.3
Hemoglobin: 13.9
MCHC: 34.3
MCV: 80.9
Platelets: 258
RBC: 4.99
RDW: 13.5
WBC: 11.3 — ABNORMAL HIGH

## 2011-02-08 LAB — I-STAT 8, (EC8 V) (CONVERTED LAB)
Acid-base deficit: 1
BUN: 10
Bicarbonate: 25 — ABNORMAL HIGH
Chloride: 106
Glucose, Bld: 92
HCT: 48 — ABNORMAL HIGH
Hemoglobin: 16.3 — ABNORMAL HIGH
Operator id: 272551
Potassium: 3.9
Sodium: 140
TCO2: 26
pCO2, Ven: 46.4
pH, Ven: 7.339 — ABNORMAL HIGH

## 2011-02-08 LAB — DIFFERENTIAL
Basophils Absolute: 0.1
Basophils Relative: 0
Eosinophils Absolute: 0.2
Eosinophils Relative: 1
Monocytes Absolute: 1 — ABNORMAL HIGH
Monocytes Relative: 6
Neutro Abs: 11.7 — ABNORMAL HIGH

## 2011-02-08 LAB — URINALYSIS, ROUTINE W REFLEX MICROSCOPIC
Glucose, UA: NEGATIVE
Hgb urine dipstick: NEGATIVE
Ketones, ur: 15 — AB
Nitrite: NEGATIVE
Protein, ur: NEGATIVE
Specific Gravity, Urine: 1.036 — ABNORMAL HIGH
Urobilinogen, UA: 1
pH: 6

## 2011-02-08 LAB — POCT PREGNANCY, URINE
Operator id: 272551
Preg Test, Ur: NEGATIVE

## 2011-02-08 LAB — CBC
Hemoglobin: 14.6
MCHC: 33
MCV: 82.5
RBC: 5.35 — ABNORMAL HIGH
RDW: 13.3

## 2011-02-08 LAB — POCT CARDIAC MARKERS
CKMB, poc: <1 — ABNORMAL LOW
Myoglobin, poc: 153
Operator id: 294511
Troponin i, poc: <0.05

## 2011-02-08 LAB — POCT I-STAT CREATININE
Creatinine, Ser: 0.9
Operator id: 272551

## 2011-02-08 LAB — WET PREP, GENITAL
Clue Cells Wet Prep HPF POC: NONE SEEN
Trich, Wet Prep: NONE SEEN

## 2011-02-17 ENCOUNTER — Encounter (INDEPENDENT_AMBULATORY_CARE_PROVIDER_SITE_OTHER): Payer: BC Managed Care – PPO | Admitting: General Surgery

## 2011-03-31 ENCOUNTER — Encounter (INDEPENDENT_AMBULATORY_CARE_PROVIDER_SITE_OTHER): Payer: BC Managed Care – PPO | Admitting: General Surgery

## 2011-06-30 ENCOUNTER — Telehealth (INDEPENDENT_AMBULATORY_CARE_PROVIDER_SITE_OTHER): Payer: Self-pay

## 2011-06-30 NOTE — Telephone Encounter (Signed)
Do you want me to bring patient in to see you or would you like me to send to PCP/OBGYN?

## 2011-06-30 NOTE — Telephone Encounter (Signed)
Pt had ventral hernia repair last year.  She states now she is having discomfort when she bends over, and also when she picks up her child.  She and her husband want to have another baby.  She would like to see Dr. Andrey Campanile just to make sure everything is okay.

## 2011-07-04 ENCOUNTER — Encounter (HOSPITAL_COMMUNITY): Payer: Self-pay

## 2011-07-04 ENCOUNTER — Emergency Department (HOSPITAL_COMMUNITY): Payer: BC Managed Care – PPO

## 2011-07-04 ENCOUNTER — Observation Stay (HOSPITAL_COMMUNITY)
Admission: EM | Admit: 2011-07-04 | Discharge: 2011-07-06 | Disposition: A | Discharge status: Home / Self Care | Payer: BC Managed Care – PPO | Attending: General Surgery | Admitting: General Surgery

## 2011-07-04 DIAGNOSIS — F17200 Nicotine dependence, unspecified, uncomplicated: Secondary | ICD-10-CM | POA: Insufficient documentation

## 2011-07-04 DIAGNOSIS — O99810 Abnormal glucose complicating pregnancy: Secondary | ICD-10-CM

## 2011-07-04 DIAGNOSIS — R109 Unspecified abdominal pain: Secondary | ICD-10-CM

## 2011-07-04 DIAGNOSIS — K5900 Constipation, unspecified: Principal | ICD-10-CM | POA: Diagnosis present

## 2011-07-04 DIAGNOSIS — R300 Dysuria: Secondary | ICD-10-CM | POA: Insufficient documentation

## 2011-07-04 LAB — URINALYSIS, ROUTINE W REFLEX MICROSCOPIC
Bilirubin Urine: NEGATIVE
Glucose, UA: NEGATIVE mg/dL
Specific Gravity, Urine: 1.022 (ref 1.005–1.030)

## 2011-07-04 LAB — DIFFERENTIAL
Basophils Absolute: 0 10*3/uL (ref 0.0–0.1)
Eosinophils Absolute: 0.3 10*3/uL (ref 0.0–0.7)
Lymphocytes Relative: 17 % (ref 12–46)
Neutrophils Relative %: 76 % (ref 43–77)

## 2011-07-04 LAB — CBC
MCHC: 29.6 g/dL — ABNORMAL LOW (ref 30.0–36.0)
Platelets: 260 10*3/uL (ref 150–400)
RDW: 18.1 % — ABNORMAL HIGH (ref 11.5–15.5)

## 2011-07-04 LAB — BASIC METABOLIC PANEL
CO2: 22 mEq/L (ref 19–32)
Calcium: 8.7 mg/dL (ref 8.4–10.5)
Creatinine, Ser: 0.63 mg/dL (ref 0.50–1.10)

## 2011-07-04 LAB — URINE MICROSCOPIC-ADD ON

## 2011-07-04 LAB — POCT PREGNANCY, URINE: Preg Test, Ur: NEGATIVE

## 2011-07-04 MED ORDER — FENTANYL CITRATE 0.05 MG/ML IJ SOLN
50.0000 ug | Freq: Once | INTRAMUSCULAR | Status: AC
  Administered 2011-07-04: 50 ug via INTRAVENOUS
  Filled 2011-07-04: qty 2

## 2011-07-04 MED ORDER — ONDANSETRON HCL 4 MG/2ML IJ SOLN
4.0000 mg | Freq: Once | INTRAMUSCULAR | Status: AC
  Administered 2011-07-04: 4 mg via INTRAVENOUS
  Filled 2011-07-04: qty 2

## 2011-07-04 MED ORDER — KETOROLAC TROMETHAMINE 30 MG/ML IJ SOLN
30.0000 mg | Freq: Once | INTRAMUSCULAR | Status: AC
  Administered 2011-07-04: 30 mg via INTRAVENOUS
  Filled 2011-07-04: qty 1

## 2011-07-04 MED ORDER — SODIUM CHLORIDE 0.9 % IV SOLN
INTRAVENOUS | Status: DC
  Administered 2011-07-04: 17:00:00 via INTRAVENOUS

## 2011-07-04 NOTE — ED Provider Notes (Signed)
Patient sent to CDU pending consult from surgery, Dr. Donell Beers. Dr. Donell Beers has come to see the patient and will admit patient for observation due to abdominal pain, and constipation potentially causing nonrotation of the bowel.  Thomasene Lot, PA-C 07/04/11 [id number]

## 2011-07-04 NOTE — H&P (Signed)
Kathleen Walsh is an 31 y.o. female.   Chief Complaint: Abdominal pain HPI:  Pt presents with around 24-36 hours of left sided abdominal pain and flank pain.  She has been having on and off soreness since having a laparoscopic ventral hernia repair last May by Dr. Andrey Campanile.  She has not experienced pain quite like this before, however.  She denies nausea or vomiting, but has had pain when she eats.  She has had decreased stool and flatus since Friday (3 days).  She denies fever/chills.  She cannot tell if her urine is bloody because she is having her period.  She has not felt a bulge in her abdomen.  Past Medical History  Diagnosis Date  . Hernia     Past Surgical History  Procedure Date  . Dilation and curettage of uterus 2004  . Foot surgery 2008  . Ventral hernia repair 09/20/10    Laparoscopic, Dr Gaynelle Adu    Family History  Problem Relation Age of Onset  . Diabetes Mother   . Hypertension Mother   . Diabetes Father   . Hypertension Father    Social History:  reports that she has been smoking.  She does not have any smokeless tobacco history on file. She reports that she drinks alcohol. Her drug history not on file.  Allergies: No Known Allergies  Medications Prior to Admission  Medication Dose Route Frequency Provider Last Rate Last Dose  . 0.9 %  sodium chloride infusion   Intravenous Continuous Nicholes Stairs, MD 125 mL/hr at 07/04/11 1651    . ketorolac (TORADOL) 30 MG/ML injection 30 mg  30 mg Intravenous Once Nicholes Stairs, MD   30 mg at 07/04/11 1651  . ondansetron (ZOFRAN) injection 4 mg  4 mg Intravenous Once Nicholes Stairs, MD   4 mg at 07/04/11 1651   No current outpatient prescriptions on file as of 07/04/2011.    Results for orders placed during the hospital encounter of 07/04/11 (from the past 48 hour(s))  URINALYSIS, ROUTINE W REFLEX MICROSCOPIC     Status: Abnormal   Collection Time   07/04/11  2:54 PM      Component Value Range Comment   Color, Urine YELLOW  YELLOW     APPearance HAZY (*) CLEAR     Specific Gravity, Urine 1.022  1.005 - 1.030     pH 6.5  5.0 - 8.0     Glucose, UA NEGATIVE  NEGATIVE (mg/dL)    Hgb urine dipstick LARGE (*) NEGATIVE     Bilirubin Urine NEGATIVE  NEGATIVE     Ketones, ur NEGATIVE  NEGATIVE (mg/dL)    Protein, ur NEGATIVE  NEGATIVE (mg/dL)    Urobilinogen, UA 0.2  0.0 - 1.0 (mg/dL)    Nitrite NEGATIVE  NEGATIVE     Leukocytes, UA TRACE (*) NEGATIVE    URINE MICROSCOPIC-ADD ON     Status: Abnormal   Collection Time   07/04/11  2:54 PM      Component Value Range Comment   Squamous Epithelial / LPF FEW (*) RARE     WBC, UA 0-2  <3 (WBC/hpf)    RBC / HPF 21-50  <3 (RBC/hpf)    Bacteria, UA FEW (*) RARE    POCT PREGNANCY, URINE     Status: Normal   Collection Time   07/04/11  3:04 PM      Component Value Range Comment   Preg Test, Ur NEGATIVE  NEGATIVE    CBC  Status: Abnormal   Collection Time   07/04/11  5:48 PM      Component Value Range Comment   WBC 15.3 (*) 4.0 - 10.5 (K/uL)    RBC 5.15 (*) 3.87 - 5.11 (MIL/uL)    Hemoglobin 10.7 (*) 12.0 - 15.0 (g/dL)    HCT 16.1  09.6 - 04.5 (%)    MCV 70.1 (*) 78.0 - 100.0 (fL)    MCH 20.8 (*) 26.0 - 34.0 (pg)    MCHC 29.6 (*) 30.0 - 36.0 (g/dL)    RDW 40.9 (*) 81.1 - 15.5 (%)    Platelets 260  150 - 400 (K/uL)   DIFFERENTIAL     Status: Abnormal   Collection Time   07/04/11  5:48 PM      Component Value Range Comment   Neutrophils Relative 76  43 - 77 (%)    Lymphocytes Relative 17  12 - 46 (%)    Monocytes Relative 5  3 - 12 (%)    Eosinophils Relative 2  0 - 5 (%)    Basophils Relative 0  0 - 1 (%)    Neutro Abs 11.6 (*) 1.7 - 7.7 (K/uL)    Lymphs Abs 2.6  0.7 - 4.0 (K/uL)    Monocytes Absolute 0.8  0.1 - 1.0 (K/uL)    Eosinophils Absolute 0.3  0.0 - 0.7 (K/uL)    Basophils Absolute 0.0  0.0 - 0.1 (K/uL)    RBC Morphology POLYCHROMASIA PRESENT      WBC Morphology ATYPICAL LYMPHOCYTES      Smear Review LARGE PLATELETS PRESENT       BASIC METABOLIC PANEL     Status: Normal   Collection Time   07/04/11  5:48 PM      Component Value Range Comment   Sodium 139  135 - 145 (mEq/L)    Potassium 3.7  3.5 - 5.1 (mEq/L)    Chloride 106  96 - 112 (mEq/L)    CO2 22  19 - 32 (mEq/L)    Glucose, Bld 95  70 - 99 (mg/dL)    BUN 12  6 - 23 (mg/dL)    Creatinine, Ser 9.14  0.50 - 1.10 (mg/dL)    Calcium 8.7  8.4 - 10.5 (mg/dL)    GFR calc non Af Amer >90  >90 (mL/min)    GFR calc Af Amer >90  >90 (mL/min)    Ct Abdomen Pelvis Wo Contrast  07/04/2011  *RADIOLOGY REPORT*  Clinical Data: Left lower quadrant and flank pain.  Prior hernia repair.  CT ABDOMEN AND PELVIS WITHOUT CONTRAST  Technique:  Multidetector CT imaging of the abdomen and pelvis was performed following the standard protocol without intravenous contrast.  Comparison: Multiple exams, including 07/04/2011 and 08/17/2010  Findings: Airway thickening in the lower lobes is accompanied by lingular and lower lobe subsegmental atelectasis.  The cystic lesion from the spleen is not well seen on today's noncontrast evaluation.  The liver, adrenal glands, and pancreas appear unremarkable.  No pathologic retroperitoneal or porta hepatis adenopathy is identified.  The kidneys appear unremarkable, as do the proximal ureters.  A supraumbilical hernia containing adipose tissue is noted, with increased surrounding inflammatory stranding. An intact hernia mesh underlies this herniated adipose tissue on image 90 of series 78295.  No pathologic pelvic adenopathy is identified.  A fluid density lesion tangential to and likely arising from the left ovary measures approximately 6.4 x 4.5 cm.  There is an adjacent smaller 1.8 x 1.4 cm fluid  density, both of which are measured on image 78 of series 4.  The right ovary appears unremarkable.  No free pelvic fluid is observed.  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  No bowel obstruction is  observed.  Degenerative disc disease is suspected at the L4-5 level.  IMPRESSION:  1.  Abnormal inflammatory stranding along herniated adipose tissue along the supraumbilical hernia. The mesh appears intact; the herniated adipose tissue may be superficial to the mesh. 2.  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 3.  New left ovarian cystic lesion measuring up to 6.4 cm in maximum length.  No surrounding inflammatory findings.  Consider follow-up pelvic sonography in 6 weeks time in order to ensure resolution. 4.  Suspected disc bulge at the L4-5 level. 5.  Airway thickening in the lower lobes, query bronchitis.  Original Report Authenticated By: Dellia Cloud, M.D.   Dg Abd Acute W/chest  07/04/2011  *RADIOLOGY REPORT*  Clinical Data: Abdominal pain and distention.  ACUTE ABDOMEN SERIES (ABDOMEN 2 VIEW & CHEST 1 VIEW)  Comparison: Chest x-ray from 01/26/2010  Findings: The lungs are clear without focal infiltrate, edema, pneumothorax or pleural effusion. Interstitial markings are diffusely coarsened with chronic features. Cardiopericardial silhouette is at upper limits of normal for size. Imaged bony structures of the thorax are intact.  Upright film shows no evidence for intraperitoneal free air. Supine abdominal film shows no gaseous bowel dilatation to suggest obstruction.  Visualized bony structures are unremarkable.  IMPRESSION: Mild chronic interstitial coarsening of the chest without acute cardiopulmonary findings.  No evidence for bowel obstruction or perforation.  Original Report Authenticated By: ERIC A. MANSELL, M.D.    Review of Systems  Constitutional: Negative.  Negative for fever, chills, weight loss, malaise/fatigue and diaphoresis.  HENT: Negative.   Eyes: Negative.   Respiratory: Negative.   Cardiovascular: Negative.   Gastrointestinal: Positive for heartburn, abdominal pain (left abdomen) and constipation. Negative for nausea,  vomiting, diarrhea, blood in stool and melena.  Genitourinary: Positive for flank pain (left). Negative for dysuria, urgency, frequency and hematuria.  Musculoskeletal: Negative.   Skin: Negative.   Neurological: Negative.  Negative for weakness.  Endo/Heme/Allergies: Negative.   Psychiatric/Behavioral: Negative.     Blood pressure 112/60, pulse 88, temperature 98.9 F (37.2 C), temperature source Oral, resp. rate 20, last menstrual period 06/03/2011, SpO2 100.00%. Physical Exam  Constitutional: She is oriented to person, place, and time. She appears well-developed and well-nourished. No distress.  HENT:  Head: Normocephalic and atraumatic.  Eyes: Conjunctivae are normal. Pupils are equal, round, and reactive to light. No scleral icterus.  Neck: Normal range of motion. Neck supple. No tracheal deviation present. No thyromegaly present.  Cardiovascular: Normal rate, regular rhythm and intact distal pulses.  Exam reveals no gallop and no friction rub.   No murmur heard. Respiratory: Effort normal and breath sounds normal. No respiratory distress. She has no wheezes. She has no rales. She exhibits no tenderness.  GI: Soft. She exhibits distension (mild abdominal distention). She exhibits no mass. There is tenderness (Left abdomen, epigastrium, umbilicus). There is no rebound and no guarding.  Musculoskeletal: Normal range of motion. She exhibits no edema and no tenderness.  Lymphadenopathy:    She has no cervical adenopathy.  Neurological: She is alert and oriented to person, place, and time. No cranial nerve deficit. Coordination normal.  Skin: Skin is  warm and dry. No rash noted. She is not diaphoretic. No erythema. No pallor.  Psychiatric: She has a normal mood and affect. Her behavior is normal. Judgment and thought content normal.     Assessment/Plan Abdominal pain.  I think that constipation is the most likely cause for the pain, but with the CT findings of malrotation, I will admit  her for observation.  She has no nausea or vomiting, no dilated or inflamed bowel on CT, and no swirling of the mesentery, so volvulus is unlikely.  She also had a similar appearing CT last spring.   We will give her enemas and recheck her white count and xrays in the AM.    I think it is likely that she has adhesions that are naturally keeping her bowel in a low risk configuration.  Her small bowel is all on the right, and the colon is all on the left.  This is what happens in corrective surgery for malrotation in the Ladd's procedure.    Alternatively as a cause of her pain, she may have some necrotic or inflamed omentum in her prior hernia.  It appears that the mesh is intact underneath the omentum, but that area looks inflamed.  It does not look like a recurrent hernia.  This may irritate her abdominal wall and may be why she has had some pain on and off since her hernia repair.    , 07/04/2011, 9:13 PM

## 2011-07-04 NOTE — ED Notes (Signed)
Report given to [facility]

## 2011-07-04 NOTE — ED Notes (Signed)
Patient transported to CT 

## 2011-07-04 NOTE — ED Notes (Signed)
Pt undressed and in a gown. Blood pressure cuff and pulse oximetry on.

## 2011-07-04 NOTE — ED Notes (Signed)
Patient presents with LLQ and flank pain since Friday with no bowel movement since as well.  Patient had hernia repair May of 2012.  Patient reporting pressure upon urination with urgency.

## 2011-07-04 NOTE — ED Notes (Signed)
Pt stated that she has not has BM since last Friday. Since then she has been having increased lower abdomen pain that radiates to navel. Pt also complaining of lower abdominal cramping. Pt stated that she has been having intermittent nausea and vomiting. No cardiac or respiratory distress. Will continue to monitor.

## 2011-07-04 NOTE — ED Provider Notes (Cosign Needed)
History     [name]: [id number]  Arrival date & time 07/04/11  1321   First MD Initiated Contact with Patient 07/04/11 1619      Chief Complaint  Patient presents with  . Flank Pain  . Abdominal Pain    (Consider location/radiation/quality/duration/timing/severity/associated sxs/prior treatment) Patient is a 31 y.o. female presenting with flank pain and abdominal pain. The history is provided by the patient.  Flank Pain Associated symptoms include abdominal pain. Pertinent negatives include no chest pain, no headaches and no shortness of breath.  Abdominal Pain The primary symptoms of the illness include abdominal pain and dysuria. The primary symptoms of the illness do not include fever, shortness of breath, nausea, vomiting or diarrhea.  Symptoms associated with the illness do not include chills or back pain.   the patient is a 31 year old, female, with a history of prior abdominal surgery, and morbid obesity, who presents to the emergency department with acute onset of left flank pain, and discomfort, when she urinates since yesterday.  She denies nausea, vomiting, fevers, chills, cough, chest pain, shortness of breath.  She denies similar history of this in the past.  She says that she had only a quarter-sized bowel movement.  Today.  She has started her menstrual cycle today.  Past Medical History  Diagnosis Date  . Hernia     Past Surgical History  Procedure Date  . Dilation and curettage of uterus 2004  . Foot surgery 2008  . Ventral hernia repair 09/20/10    Laparoscopic, Dr Gaynelle Adu    Family History  Problem Relation Age of Onset  . Diabetes Mother   . Hypertension Mother   . Diabetes Father   . Hypertension Father     History  Substance Use Topics  . Smoking status: Current Everyday Smoker -- 0.3 packs/day  . Smokeless tobacco: Not on file  . Alcohol Use: Yes     "once every three years"     OB History    Grav Para Term Preterm Abortions TAB SAB Ect Mult  Living                  Review of Systems  Constitutional: Negative for fever and chills.  Respiratory: Negative for cough and shortness of breath.   Cardiovascular: Negative for chest pain.  Gastrointestinal: Positive for abdominal pain. Negative for nausea, vomiting and diarrhea.  Genitourinary: Positive for dysuria and flank pain.  Musculoskeletal: Negative for back pain.  Neurological: Negative for headaches.  Psychiatric/Behavioral: Negative for confusion.  All other systems reviewed and are negative.    Allergies  Review of patient's allergies indicates no known allergies.  Home Medications   Current Outpatient Rx  Name Route Sig Dispense Refill  . POLYETHYLENE GLYCOL 3350 PO POWD Oral Take 17 g by mouth daily as needed. For constipation    . RANITIDINE HCL 150 MG PO TABS Oral Take 150 mg by mouth 2 (two) times daily as needed. For acid reflux      BP 119/67  Pulse 95  Temp(Src) 98.9 F (37.2 C) (Oral)  Resp 18  SpO2 100%  LMP 06/03/2011  Physical Exam  Constitutional: She is oriented to person, place, and time.       Morbidly obese  HENT:  Head: Normocephalic and atraumatic.  Eyes: Conjunctivae are normal. Pupils are equal, round, and reactive to light.  Neck: Normal range of motion. Neck supple.  Cardiovascular: Normal rate and regular rhythm.   Pulmonary/Chest: Effort normal and breath sounds  normal. No respiratory distress.  Abdominal: Soft. She exhibits no distension. There is no tenderness.  Musculoskeletal: Normal range of motion.  Neurological: She is alert and oriented to person, place, and time.  Skin: Skin is warm and dry.  Psychiatric: She has a normal mood and affect.    ED Course  Procedures (including critical care time) 31 year old, female, with a history of abdominal surgery, presents with left-sided flank pain since yesterday.  She has had decreased bowel movements as well.  She hasn't discomfort with urination, also, and has started her  menstrual cycle today.  Urinalysis shows blood.  She is not pregnant.  We will perform an acute abdominal series looking for an obstruction that is, negative.  We'll do a CAT scan of the abdomen to look for a urinary source for her symptoms.  Labs Reviewed  URINALYSIS, ROUTINE W REFLEX MICROSCOPIC - Abnormal; Notable for the following:    APPearance HAZY (*)    Hgb urine dipstick LARGE (*)    Leukocytes, UA TRACE (*)    All other components within normal limits  URINE MICROSCOPIC-ADD ON - Abnormal; Notable for the following:    Squamous Epithelial / LPF FEW (*)    Bacteria, UA FEW (*)    All other components within normal limits  POCT PREGNANCY, URINE  CBC  DIFFERENTIAL  BASIC METABOLIC PANEL   No results found.   No diagnosis found.    MDM  Abdominal pain Leukocytosis. Bowel malrotation        Nicholes Stairs, MD 07/05/11 [phone]

## 2011-07-05 ENCOUNTER — Observation Stay (HOSPITAL_COMMUNITY): Payer: BC Managed Care – PPO

## 2011-07-05 ENCOUNTER — Encounter (HOSPITAL_COMMUNITY): Payer: Self-pay | Admitting: Surgery

## 2011-07-05 LAB — CBC
HCT: 34.9 % — ABNORMAL LOW (ref 36.0–46.0)
MCHC: 29.5 g/dL — ABNORMAL LOW (ref 30.0–36.0)
MCV: 69.9 fL — ABNORMAL LOW (ref 78.0–100.0)
Platelets: 228 10*3/uL (ref 150–400)
RDW: 18.2 % — ABNORMAL HIGH (ref 11.5–15.5)
WBC: 10.7 10*3/uL — ABNORMAL HIGH (ref 4.0–10.5)

## 2011-07-05 LAB — BASIC METABOLIC PANEL
BUN: 13 mg/dL (ref 6–23)
CO2: 27 mEq/L (ref 19–32)
Calcium: 8.8 mg/dL (ref 8.4–10.5)
Chloride: 110 mEq/L (ref 96–112)
Creatinine, Ser: 0.72 mg/dL (ref 0.50–1.10)
GFR calc Af Amer: >90 mL/min (ref >90)

## 2011-07-05 MED ORDER — OXYCODONE-ACETAMINOPHEN 5-325 MG PO TABS
1.0000 | ORAL_TABLET | ORAL | Status: DC | PRN
  Administered 2011-07-05 (×2): 2 via ORAL
  Filled 2011-07-05 (×2): qty 2

## 2011-07-05 MED ORDER — DIPHENHYDRAMINE HCL 50 MG/ML IJ SOLN: 12.5000 mg | Freq: Four times a day (QID) | INTRAMUSCULAR | Status: DC | PRN

## 2011-07-05 MED ORDER — ACETAMINOPHEN 325 MG PO TABS: 650.0000 mg | ORAL_TABLET | Freq: Four times a day (QID) | ORAL | Status: DC | PRN

## 2011-07-05 MED ORDER — DEXTROSE IN LACTATED RINGERS 5 % IV SOLN
INTRAVENOUS | Status: DC
  Administered 2011-07-05: via INTRAVENOUS

## 2011-07-05 MED ORDER — BIOTENE DRY MOUTH MT LIQD: 15.0000 mL | Freq: Two times a day (BID) | OROMUCOSAL | Status: DC

## 2011-07-05 MED ORDER — ONDANSETRON HCL 4 MG/2ML IJ SOLN: 4.0000 mg | Freq: Four times a day (QID) | INTRAMUSCULAR | Status: DC | PRN

## 2011-07-05 MED ORDER — MORPHINE SULFATE 2 MG/ML IJ SOLN
1.0000 mg | INTRAMUSCULAR | Status: DC | PRN
  Administered 2011-07-05: 1 mg via INTRAVENOUS
  Administered 2011-07-05: 2 mg via INTRAVENOUS
  Filled 2011-07-05 (×3): qty 1

## 2011-07-05 MED ORDER — DIPHENHYDRAMINE HCL 12.5 MG/5ML PO ELIX
12.5000 mg | ORAL_SOLUTION | Freq: Four times a day (QID) | ORAL | Status: DC | PRN
  Filled 2011-07-05: qty 10

## 2011-07-05 MED ORDER — PANTOPRAZOLE SODIUM 40 MG IV SOLR
40.0000 mg | Freq: Every day | INTRAVENOUS | Status: DC
  Administered 2011-07-05: 40 mg via INTRAVENOUS
  Filled 2011-07-05 (×2): qty 40

## 2011-07-05 MED ORDER — ENOXAPARIN SODIUM 40 MG/0.4ML ~~LOC~~ SOLN
40.0000 mg | SUBCUTANEOUS | Status: DC
  Administered 2011-07-05: 40 mg via SUBCUTANEOUS
  Filled 2011-07-05 (×2): qty 0.4

## 2011-07-05 MED ORDER — SORBITOL 70 % SOLN
960.0000 mL | TOPICAL_OIL | Freq: Once | ORAL | Status: AC
  Administered 2011-07-05: 960 mL via RECTAL
  Filled 2011-07-05 (×2): qty 240

## 2011-07-05 MED ORDER — ACETAMINOPHEN 650 MG RE SUPP: 650.0000 mg | Freq: Four times a day (QID) | RECTAL | Status: DC | PRN

## 2011-07-05 MED ORDER — CHLORHEXIDINE GLUCONATE 0.12 % MT SOLN
15.0000 mL | Freq: Two times a day (BID) | OROMUCOSAL | Status: DC
  Administered 2011-07-05: 15 mL via OROMUCOSAL
  Filled 2011-07-05: qty 15

## 2011-07-05 NOTE — Progress Notes (Signed)
The patient is in no acute distress.  Will start on clear liquids and advance diet as tolerated as the patient would like to eat and pain is currently controlled.  Will follow up as outpatient with Dr. Andrey Campanile.  Marta Lamas. Gae Bon, MD, FACS [phone] [facility]

## 2011-07-05 NOTE — Progress Notes (Signed)
UR of chart complete.  

## 2011-07-05 NOTE — ED Provider Notes (Signed)
Medical screening examination/treatment/procedure(s) were conducted as a shared visit with non-physician practitioner(s) and myself.  I personally evaluated the patient during the encounter  Nicholes Stairs, MD 07/05/11 [phone]

## 2011-07-05 NOTE — Progress Notes (Signed)
Patient ID: Kathleen Walsh, female   DOB: 12-Jul-1980, 31 y.o.   MRN: [id number]    Subjective: Pt still has some left flank pain.  This is a vague pain.  Had to episodes of flatus since arrival.  Trying to get SMOG enema in.  Denies N/V and was tolerating a solid diet at home without difficulties.  Objective: Vital signs in last 24 hours: Temp:  [97.9 F (36.6 C)-98.9 F (37.2 C)] 97.9 F (36.6 C) (03/05 0616) Pulse Rate:  [72-95] 78  (03/05 0616) Resp:  [16-20] 17  (03/05 0616) BP: (103-136)/(60-89) 125/79 mmHg (03/05 0616) SpO2:  [95 %-100 %] 98 % (03/05 0616) Weight:  [316 lb (143.337 kg)] 316 lb (143.337 kg) (03/05 0004) Last BM Date: 07/01/11  Intake/Output from previous day: 03/04 0701 - 03/05 0700 In: 565.4 [I.V.:565.4] Out: 350 [Urine:350] Intake/Output this shift:    PE: Abd: soft, mild left flank and left sided abdominal pain. +BS, ND Heart: regular Lungs: CTAB  Lab Results:   Basename 07/05/11 0503 07/04/11 1748  WBC 10.7* 15.3*  HGB 10.3* 10.7*  HCT 34.9* 36.1  PLT 228 260   BMET  Basename 07/05/11 0503 07/04/11 1748  NA 141 139  K 3.6 3.7  CL 110 106  CO2 27 22  GLUCOSE 99 95  BUN 13 12  CREATININE 0.72 0.63  CALCIUM 8.8 8.7   PT/INR No results found for this basename: LABPROT:2,INR:2 in the last 72 hours   Studies/Results: Ct Abdomen Pelvis Wo Contrast  07/04/2011  *RADIOLOGY REPORT*  Clinical Data: Left lower quadrant and flank pain.  Prior hernia repair.  CT ABDOMEN AND PELVIS WITHOUT CONTRAST  Technique:  Multidetector CT imaging of the abdomen and pelvis was performed following the standard protocol without intravenous contrast.  Comparison: Multiple exams, including 07/04/2011 and 08/17/2010  Findings: Airway thickening in the lower lobes is accompanied by lingular and lower lobe subsegmental atelectasis.  The cystic lesion from the spleen is not well seen on today's noncontrast evaluation.  The liver, adrenal glands, and pancreas appear  unremarkable.  No pathologic retroperitoneal or porta hepatis adenopathy is identified.  The kidneys appear unremarkable, as do the proximal ureters.  A supraumbilical hernia containing adipose tissue is noted, with increased surrounding inflammatory stranding. An intact hernia mesh underlies this herniated adipose tissue on image 90 of series [id number].  No pathologic pelvic adenopathy is identified.  A fluid density lesion tangential to and likely arising from the left ovary measures approximately 6.4 x 4.5 cm.  There is an adjacent smaller 1.8 x 1.4 cm fluid density, both of which are measured on image 78 of series 4.  The right ovary appears unremarkable.  No free pelvic fluid is observed.  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  No bowel obstruction is observed.  Degenerative disc disease is suspected at the L4-5 level.  IMPRESSION:  1.  Abnormal inflammatory stranding along herniated adipose tissue along the supraumbilical hernia. The mesh appears intact; the herniated adipose tissue may be superficial to the mesh. 2.  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 3.  New left ovarian cystic lesion measuring up to 6.4 cm in maximum length.  No surrounding inflammatory findings.  Consider follow-up pelvic sonography in 6 weeks time in order to ensure resolution. 4.  Suspected disc bulge at the L4-5 level. 5.  Airway thickening in  the lower lobes, query bronchitis.  Original Report Authenticated By: Dellia Cloud, M.D.   Dg Abd Acute W/chest  07/04/2011  *RADIOLOGY REPORT*  Clinical Data: Abdominal pain and distention.  ACUTE ABDOMEN SERIES (ABDOMEN 2 VIEW & CHEST 1 VIEW)  Comparison: Chest x-ray from 01/26/2010  Findings: The lungs are clear without focal infiltrate, edema, pneumothorax or pleural effusion. Interstitial markings are diffusely coarsened with chronic features.  Cardiopericardial silhouette is at upper limits of normal for size. Imaged bony structures of the thorax are intact.  Upright film shows no evidence for intraperitoneal free air. Supine abdominal film shows no gaseous bowel dilatation to suggest obstruction.  Visualized bony structures are unremarkable.  IMPRESSION: Mild chronic interstitial coarsening of the chest without acute cardiopulmonary findings.  No evidence for bowel obstruction or perforation.  Original Report Authenticated By: ERIC A. MANSELL, M.D.   Dg Abd Portable 2v  07/05/2011  *RADIOLOGY REPORT*  Clinical Data: ABDOMINAL PAIN.  PORTABLE ABDOMEN - 2 VIEW  Comparison: 07/04/2011  Findings: A nonobstructive bowel gas pattern.  No free air.  No organomegaly or suspicious calcification.  IMPRESSION: No obstruction or free air.  Original Report Authenticated By: Cyndie Chime, M.D.    Anti-infectives: Anti-infectives    None       Assessment/Plan  1. Left flank pain, unknown cause 2. S/p repair of abdominal wall hernia in may 2012  Plan: 1. Will advance diet today. 2. Continue with the SMOG enema today to see if this helps with having a bowel movement. 3. No clear etiology for her abdominal pain. 4. If feeling better today and hopefully can have a BM, will likely d/c home and have her follow up with Dr. Andrey Campanile in our office.   LOS: 1 day    , E 07/05/2011

## 2011-07-06 ENCOUNTER — Telehealth (INDEPENDENT_AMBULATORY_CARE_PROVIDER_SITE_OTHER): Payer: Self-pay | Admitting: General Surgery

## 2011-07-06 DIAGNOSIS — K5900 Constipation, unspecified: Secondary | ICD-10-CM | POA: Diagnosis present

## 2011-07-06 DIAGNOSIS — R109 Unspecified abdominal pain: Secondary | ICD-10-CM | POA: Diagnosis present

## 2011-07-06 MED ORDER — POLYETHYLENE GLYCOL 3350 17 GM/SCOOP PO POWD: 17.0000 g | Freq: Every day | ORAL | Status: DC

## 2011-07-06 MED ORDER — OXYCODONE-ACETAMINOPHEN 5-325 MG PO TABS: 1.0000 | ORAL_TABLET | ORAL | Status: AC | PRN

## 2011-07-06 NOTE — Discharge Summary (Signed)
Physician Discharge Summary  Patient ID: Kathleen Walsh MRN: [id number] DOB/AGE: 09-27-1980 30 y.o.  Admit date: 07/04/2011 Discharge date: 07/06/2011  Admission Diagnoses: Constipation, abdominal pain  Discharge Diagnoses:  Active Problems:  Constipation  Abdominal pain   Discharged Condition: good  Hospital Course: Patient is a 31 yo F with history of hernia repair in May 2012 who presented with 3 day history of abdominal pain, decreased po intake, and decreased flatus/bowel movements admitted for constipation with some concern for CT scan showing malrotation, which has been stable for 1 year.  Patient was given SMOG enema and observed.  She had no worsening of her abdominal pain, and did not have symptoms concerns for volvulus.  Alternative source for her pain may be some necrotic or inflamed omentum in her prior hernia. It appears that the mesh is intact underneath the omentum, but that area looks inflamed. It does not look like a recurrent hernia. This may irritate her abdominal wall and may be why she has had some pain on and off since her hernia repair.  She was having flatus at the time of discharge but had not yet had a bowel movement.   Consults: None  Significant Diagnostic Studies: radiology: CT scan: 1. Abnormal inflammatory stranding along herniated adipose tissue along the supraumbilical hernia. The mesh appears intact; the herniated adipose tissue may be superficial to the mesh. 2. 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 3. New left ovarian cystic lesion measuring up to 6.4 cm in maximum length. No surrounding inflammatory findings. Consider follow-up pelvic sonography in 6 weeks time in order to ensure resolution. 4. Suspected disc bulge at the L4-5 level. 5. Airway thickening in the lower lobes, query bronchitis   Treatments: IV hydration, analgesia: percocet,  and SMOG enema  Discharge Exam: Blood pressure 127/73,  pulse 78, temperature 98.5 F (36.9 C), temperature source Oral, resp. rate 18, height 5\' 6"  (1.676 m), weight 316 lb (143.337 kg), last menstrual period 06/03/2011, SpO2 100.00%. General appearance: alert, cooperative and morbidly obese Resp: clear to auscultation bilaterally Cardio: regular rate and rhythm, S1, S2 normal, no murmur, click, rub or gallop GI: normal findings: bowel sounds normal and abnormal findings:  obese and diffuse mild tenderness to palpation Extremities: extremities normal, atraumatic, no cyanosis or edema  Disposition: 01-Home or Self Care   Medication List  As of 07/06/2011  8:16 AM   ASK your doctor about these medications         polyethylene glycol powder powder   Commonly known as: GLYCOLAX/MIRALAX   Take 17 g by mouth daily as needed. For constipation      ranitidine 150 MG tablet   Commonly known as: ZANTAC   Take 150 mg by mouth 2 (two) times daily as needed. For acid reflux           Follow-up Information    Schedule an appointment as soon as possible for a visit with Atilano Ina, MD,FACS.   Contact information:   [facility] [address] [place] [address] [phone]          Signed: Demetria Pore 07/06/2011, 8:16 AM

## 2011-07-06 NOTE — Discharge Summary (Signed)
I spoke with Dr. Andrey Campanile about the patient's hospital course and he understands that he will be seeing her again as an outpatient for her ongoing chronic abdominal pain.  Marta Lamas. Gae Bon, MD, FACS [phone] [facility]

## 2011-07-06 NOTE — Progress Notes (Signed)
Discharge Home. Home discharge instruction given , no question verbalized. Alert and oriented, not in any distress, ambulatory.

## 2011-07-06 NOTE — Discharge Instructions (Signed)

## 2011-07-06 NOTE — Telephone Encounter (Signed)
Left message on my voicemail stating she needs appt with Dr Andrey Campanile ASAP for stomach issues.

## 2011-07-07 NOTE — Telephone Encounter (Signed)
Appt made for patient to follow up with Dr Andrey Campanile, Instructed on some different things she can try to move her bowels. She is on mirilax now. She needs to starts a stool softener, drink plenty of water and make sure she is up moving around. She is to call if she has no success in the next couple days.

## 2011-07-20 ENCOUNTER — Ambulatory Visit (INDEPENDENT_AMBULATORY_CARE_PROVIDER_SITE_OTHER): Payer: BC Managed Care – PPO | Admitting: General Surgery

## 2011-07-20 ENCOUNTER — Encounter (INDEPENDENT_AMBULATORY_CARE_PROVIDER_SITE_OTHER): Payer: Self-pay | Admitting: General Surgery

## 2011-07-20 DIAGNOSIS — R109 Unspecified abdominal pain: Secondary | ICD-10-CM

## 2011-07-20 NOTE — Patient Instructions (Signed)

## 2011-08-01 NOTE — Progress Notes (Signed)
Chief complaint: Followup  Procedure: Status post laparoscopic repair of ventral hernia with mesh Sep 20, 2010  History of Present Ilness: 31 year old morbidly obese Caucasian female comes in for long-term followup. I last saw her on November 12, 2010. She was recently admitted to the hospital from March 4 through March 6. She was admitted because of abdominal pain around the site of her previous hernia repair. She underwent a CT scan. She is here for followup. 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 She states that she has had persistent pain in one area ever since surgery. It is located just above her umbilicus. It is constant. She rates it as a 4/10. She was constipated going into the hospital. She now reports that she is regular. She denies any melena or hematochezia. She denies any fevers, chills, or vomiting.  PMHx, PSHx, SOCHx, FAMHx, ALL reviewed and unchanged  Physical Exam: BP 128/80  Pulse 68  Temp(Src) 96.4 F (35.8 C) (Temporal)  Resp 18  Ht 5\' 6"  (1.676 m)  Wt 305 lb 4 oz (138.46 kg)  BMI 49.27 kg/m2  LMP 06/03/2011  Gen: alert, NAD, non-toxic appearing, morbidly obese HEENT: normocephalic, atraumatic; pupils equal, no scleral icterus, neck supple, no lymphadenopathy Pulm: Lungs clear to auscultation, symmetric chest rise CV: regular rate and rhythm Abd: soft, nontender, nondistended, obese. Well-healed trocar sites. No incisional hernia. Has a subcutaneous knot about the size of a walnut superior to umbilicus.  Ext: no edema, normal, symmetric strength Neuro: nonfocal, sensation grossly intact Psych: appropriate, judgment normal  Hospital d/c summary reviewed CT reviewed and discussed with radiology.   Assessment and Plan: 31 year old morbidly obese Caucasian female status post laparoscopic repair of ventral hernia with mesh  There is no evidence of hernia recurrence. There appears to be a small piece of adipose  tissue left within the hernia sac. The mesh covers the abdominal wall.  We discussed the pros and cons of going back to the operating room and cutting down on this area to excise the retained piece of adipose tissue. I explained that this would expose her mesh and potentially increase her risk for a mesh infection. I explained that if her mesh became infected it would have to be surgically explanted.  The patient has decided that the benefits do not outweigh the risk and therefore she would like to keep an eye on this area for now. Her main question was whether or not this would effect a future pregnancy. I explained that this would not; however, her weight will be more of an issue for future pregnancy.  Followup p.r.n.  Mary Sella. Andrey Campanile, MD, FACS General, Bariatric, & Minimally Invasive Surgery [facility], [place]

## 2011-09-19 HISTORY — PX: HERNIA REPAIR: SHX51

## 2011-12-07 ENCOUNTER — Encounter (HOSPITAL_COMMUNITY): Payer: Self-pay | Admitting: *Deleted

## 2011-12-07 ENCOUNTER — Emergency Department (HOSPITAL_COMMUNITY)
Admission: EM | Admit: 2011-12-07 | Discharge: 2011-12-07 | Disposition: A | Discharge status: Home / Self Care | Payer: BC Managed Care – PPO | Attending: Emergency Medicine | Admitting: Emergency Medicine

## 2011-12-07 ENCOUNTER — Emergency Department (HOSPITAL_COMMUNITY): Payer: BC Managed Care – PPO

## 2011-12-07 DIAGNOSIS — R112 Nausea with vomiting, unspecified: Secondary | ICD-10-CM | POA: Insufficient documentation

## 2011-12-07 DIAGNOSIS — R109 Unspecified abdominal pain: Secondary | ICD-10-CM | POA: Insufficient documentation

## 2011-12-07 DIAGNOSIS — R10819 Abdominal tenderness, unspecified site: Secondary | ICD-10-CM | POA: Insufficient documentation

## 2011-12-07 LAB — BASIC METABOLIC PANEL
BUN: 12 mg/dL (ref 6–23)
CO2: 23 mEq/L (ref 19–32)
Chloride: 103 mEq/L (ref 96–112)
Creatinine, Ser: 0.6 mg/dL (ref 0.50–1.10)

## 2011-12-07 LAB — URINALYSIS, ROUTINE W REFLEX MICROSCOPIC
Bilirubin Urine: NEGATIVE
Hgb urine dipstick: NEGATIVE
Nitrite: NEGATIVE
Protein, ur: NEGATIVE mg/dL
Specific Gravity, Urine: 1.024 (ref 1.005–1.030)
Urobilinogen, UA: 0.2 mg/dL (ref 0.0–1.0)

## 2011-12-07 LAB — URINE MICROSCOPIC-ADD ON

## 2011-12-07 LAB — CBC
HCT: 37.5 % (ref 36.0–46.0)
MCV: 71.3 fL — ABNORMAL LOW (ref 78.0–100.0)
Platelets: 243 10*3/uL (ref 150–400)
RBC: 5.26 MIL/uL — ABNORMAL HIGH (ref 3.87–5.11)
WBC: 13.3 10*3/uL — ABNORMAL HIGH (ref 4.0–10.5)

## 2011-12-07 MED ORDER — HYDROCODONE-ACETAMINOPHEN 5-325 MG PO TABS: 1.0000 | ORAL_TABLET | ORAL | Status: AC | PRN

## 2011-12-07 MED ORDER — ONDANSETRON 8 MG PO TBDP: 8.0000 mg | ORAL_TABLET | Freq: Three times a day (TID) | ORAL | Status: AC | PRN

## 2011-12-07 MED ORDER — ONDANSETRON HCL 4 MG/2ML IJ SOLN
4.0000 mg | Freq: Once | INTRAMUSCULAR | Status: AC
  Administered 2011-12-07: 4 mg via INTRAVENOUS
  Filled 2011-12-07: qty 2

## 2011-12-07 MED ORDER — SODIUM CHLORIDE 0.9 % IV BOLUS (SEPSIS)
1000.0000 mL | Freq: Once | INTRAVENOUS | Status: AC
  Administered 2011-12-07: 1000 mL via INTRAVENOUS

## 2011-12-07 MED ORDER — MORPHINE SULFATE 4 MG/ML IJ SOLN
8.0000 mg | Freq: Once | INTRAMUSCULAR | Status: AC
  Administered 2011-12-07: 4 mg via INTRAVENOUS
  Filled 2011-12-07: qty 1

## 2011-12-07 NOTE — ED Notes (Signed)
Patient transported to CT 

## 2011-12-07 NOTE — ED Notes (Signed)
C/o vomiting since Monday night. Voices she feels weak and dehydrated. Also having abdominal cramping and back pain.

## 2011-12-07 NOTE — ED Notes (Signed)
Report received, assumed care.  

## 2011-12-07 NOTE — ED Provider Notes (Signed)
History     [name]: [id number]  Arrival date & time 12/07/11  7829   First MD Initiated Contact with Patient 12/07/11 [phone]      Chief Complaint  Patient presents with  . Emesis    (Consider location/radiation/quality/duration/timing/severity/associated sxs/prior treatment) The history is provided by the patient.   the patient reports developing nausea and vomiting without diarrhea for the past 48 hours.  She reports mild to moderate pain in her right lower quadrant that radiates to her right groin and into her right flank.  She denies tissue area and urinary frequency.  She has no new vaginal complaints.  She has a history of right inguinal hernia repair with recurrent pain after the procedure but reports this feels slightly different.  She denies diarrhea or constipation.  She has no fevers or chills.  She has no chest pain shortness of breath.  Nothing worsens or improves her symptoms.   Past Medical History  Diagnosis Date  . Hernia   . H/O colonoscopy 2008    polyp removed    Past Surgical History  Procedure Date  . Dilation and curettage of uterus 2004  . Foot surgery 2008  . Ventral hernia repair 09/20/10    Laparoscopic, Dr Gaynelle Adu    Family History  Problem Relation Age of Onset  . Diabetes Mother   . Hypertension Mother   . Diabetes Father   . Hypertension Father     History  Substance Use Topics  . Smoking status: Current Everyday Smoker -- 0.3 packs/day    Types: Cigarettes  . Smokeless tobacco: Never Used  . Alcohol Use: Yes     "once every three years"     OB History    Grav Para Term Preterm Abortions TAB SAB Ect Mult Living                  Review of Systems  All other systems reviewed and are negative.    Allergies  Review of patient's allergies indicates no known allergies.  Home Medications   Current Outpatient Rx  Name Route Sig Dispense Refill  . AMOXICILLIN-POT CLAVULANATE 875-125 MG PO TABS Oral Take 1 tablet by mouth 2 (two)  times daily.    . FUROSEMIDE 20 MG PO TABS Oral Take 20 mg by mouth daily.    Marland Kitchen HYDROCODONE-ACETAMINOPHEN 5-500 MG PO TABS Oral Take 1 tablet by mouth every 6 (six) hours as needed. For pain    . METFORMIN HCL ER 500 MG PO TB24 Oral Take 500 mg by mouth daily with breakfast.    . RANITIDINE HCL 150 MG PO TABS Oral Take 150 mg by mouth 2 (two) times daily as needed. For acid reflux      BP 130/85  Pulse 101  Temp 98.2 F (36.8 C) (Oral)  Resp 18  SpO2 97%  LMP 11/15/2011  Physical Exam  Nursing note and vitals reviewed. Constitutional: She is oriented to person, place, and time. She appears well-developed and well-nourished. No distress.  HENT:  Head: Normocephalic and atraumatic.       Mucous membranes dry  Eyes: EOM are normal.  Neck: Normal range of motion.  Cardiovascular: Normal rate, regular rhythm and normal heart sounds.   Pulmonary/Chest: Effort normal and breath sounds normal.  Abdominal: Soft. She exhibits no distension.       Mild right lower quadrant abdominal tenderness without guarding or rebound.  Mild suprapubic tenderness.  Musculoskeletal: Normal range of motion.  Neurological: She is alert  and oriented to person, place, and time.  Skin: Skin is warm and dry.  Psychiatric: She has a normal mood and affect. Judgment normal.    ED Course  Procedures (including critical care time)  Labs Reviewed  URINALYSIS, ROUTINE W REFLEX MICROSCOPIC - Abnormal; Notable for the following:    APPearance CLOUDY (*)     Leukocytes, UA SMALL (*)     All other components within normal limits  CBC - Abnormal; Notable for the following:    WBC 13.3 (*)     RBC 5.26 (*)     Hemoglobin 11.5 (*)     MCV 71.3 (*)     MCH 21.9 (*)     RDW 17.6 (*)     All other components within normal limits  BASIC METABOLIC PANEL - Abnormal; Notable for the following:    Glucose, Bld 100 (*)     All other components within normal limits  URINE MICROSCOPIC-ADD ON - Abnormal; Notable for the  following:    Squamous Epithelial / LPF MANY (*)     All other components within normal limits  POCT PREGNANCY, URINE   Ct Abdomen Pelvis Wo Contrast  12/07/2011  *RADIOLOGY REPORT*  Clinical Data: Emesis.  Right flank and groin pain.  Nausea and vomiting.  CT ABDOMEN AND PELVIS WITHOUT CONTRAST  Technique:  Multidetector CT imaging of the abdomen and pelvis was performed following the standard protocol without intravenous contrast.  Comparison: CT of the abdomen and pelvis 07/04/2011.  Findings:  Lung Bases: 5 mm subpleural nodule in the periphery of the left lower lobe (image 8 of series 3) is completely unchanged compared to remote prior examination 01/26/2010 and is strongly favored to represent a benign subpleural lymph node.  Scarring in the inferior segment of the lingula.  Abdomen/Pelvis:  There are no abnormal calcifications within the collecting system of either kidney, along the course of either ureter, or within the lumen of the urinary bladder to suggest the presence of urinary tract calculi.  No hydroureteronephrosis or perinephric stranding to suggest urinary tract obstruction at this time.  The unenhanced appearance of the liver, gallbladder, pancreas, spleen, bilateral adrenal glands and bilateral kidneys is unremarkable.  Incomplete rotation of the bowel is incidentally noted, with the cecum in the midline, and a large portion of the small bowel occupying the right lower quadrant of the abdomen.  The appendix is normal.  No ascites or pneumoperitoneum and no pathologic distension of bowel.  No definite pathologic lymphadenopathy identified within the abdomen or pelvis on this noncontrast CT examination.  The left ovary appears enlarged measuring approximately 5.3 x 4.3 cm, but is poorly evaluated on this noncontrast CT examination.  Right ovary, uterus and urinary bladder are unremarkable. Tiny umbilical hernia containing a small amount of omental fat.  No evidence of bowel incarceration or  obstruction at this time.  Musculoskeletal: There are no aggressive appearing lytic or blastic lesions noted in the visualized portions of the skeleton.  IMPRESSION: 1. No abnormal urinary tract calculi or findings to suggest urinary tract obstruction.  The left ovary appears enlarged measuring 5.3 x 4.3 cm.  This is incompletely evaluated on this noncontrast CT scan, and is nonspecific.  This may simply reflect the presence of multiple large follicles or cysts.  However, if there is clinical concern for ovarian pathology, including ovarian torsion, further evaluation with transvaginal ultrasound may be warranted. 3.  Normal appendix.  4.  Incomplete rotation of the bowel incidentally noted (normal anatomical  variant). 5.  Tiny umbilical hernia containing only omental fat.  Original Report Authenticated By: Florencia Reasons, M.D.    I personally reviewed the imaging tests through PACS system  I reviewed available ER/hospitalization records thought the EMR   1. Abdominal pain   2. Nausea & vomiting       MDM  Labs urine and fluids down.  CT abdomen and pelvis to evaluate for possible ureteral colic versus other pathology in her right lower quadrant  9:23 AM The patient is feeling much better at this time.  Repeat abdominal exam is benign.  Discharge home with pain medicine and antinausea medicine.  PCP followup.  The patient understands to return to the emergency department for new or worsening symptoms        Lyanne Co, MD 12/07/11 [phone]

## 2011-12-07 NOTE — ED Notes (Signed)
MD at bedside. 

## 2011-12-12 ENCOUNTER — Telehealth (INDEPENDENT_AMBULATORY_CARE_PROVIDER_SITE_OTHER): Payer: Self-pay | Admitting: General Surgery

## 2011-12-12 NOTE — Telephone Encounter (Signed)
Patient called after making an appt with Dr Andrey Campanile next week. She is in "extreme pain" from old hernia site. She had this repaired last year by Dr Andrey Campanile. She states she noticed a bulge this weekend and has been having a lot of pain. She states she can not bend over. No vomiting/nausea. Dr Andrey Campanile out of town this week. I advised if bulge is not reducible she needs to go to the emergency room. She did not want to do that. I made appt in open slot with Dr Magnus Ivan to check patient tomorrow.

## 2011-12-13 ENCOUNTER — Other Ambulatory Visit (INDEPENDENT_AMBULATORY_CARE_PROVIDER_SITE_OTHER): Payer: Self-pay | Admitting: Surgery

## 2011-12-13 ENCOUNTER — Ambulatory Visit (INDEPENDENT_AMBULATORY_CARE_PROVIDER_SITE_OTHER): Payer: BC Managed Care – PPO | Admitting: Surgery

## 2011-12-13 ENCOUNTER — Encounter (INDEPENDENT_AMBULATORY_CARE_PROVIDER_SITE_OTHER): Payer: Self-pay | Admitting: Surgery

## 2011-12-13 VITALS — BP 128/74 | HR 80 | Temp 98.6°F | Resp 16 | Ht 66.0 in | Wt 309.8 lb

## 2011-12-13 DIAGNOSIS — K469 Unspecified abdominal hernia without obstruction or gangrene: Secondary | ICD-10-CM

## 2011-12-13 DIAGNOSIS — R109 Unspecified abdominal pain: Secondary | ICD-10-CM

## 2011-12-13 LAB — BUN: BUN: 11 mg/dL (ref 6–23)

## 2011-12-13 NOTE — Progress Notes (Signed)
Subjective:     Patient ID: Kathleen Walsh, female   DOB: [birth year], 31 y.o.   MRN: [id number]  HPI  This is a patient of Dr. Tawana Scale who is status post a laparoscopic ventral hernia repair with mesh last year. She reports that she was doing some heavy lifting and felt a pull, and has now had significant abdominal pain since then. She presented to the emergency part of the weekend and had a CAT scan which was unremarkable except for an ovarian cyst. She now tells me she went to the ER for different pain and had a CAT scan and after that did lifting and now has the knee pain. She reports nausea and constipation which is not being relieved with medications.  Review of Systems     Objective:   Physical Exam On exam, she is morbidly obese. Her vital signs are completely normal. Her abdomen is soft and obese. There is tenderness which is mild guarding across the lower abdomen. I cannot feel a hernia defect.    Assessment:     Abdominal pain of uncertain etiology.    Plan:     Because she states this is a new pain that occurred since that most recent CAT scan, I had no choice but the CAT scan her again to rule out recurrence versus a rupturing of the ovarian cyst. She will keep her appointment with Dr. Andrey Campanile next week. We will call her back if there is something significant on CAT scan.

## 2011-12-14 ENCOUNTER — Ambulatory Visit
Admission: RE | Admit: 2011-12-14 | Discharge: 2011-12-14 | Disposition: A | Discharge status: Home / Self Care | Payer: BC Managed Care – PPO | Source: Ambulatory Visit | Attending: Surgery | Admitting: Surgery

## 2011-12-14 DIAGNOSIS — K469 Unspecified abdominal hernia without obstruction or gangrene: Secondary | ICD-10-CM

## 2011-12-14 MED ORDER — IOHEXOL 300 MG/ML  SOLN
125.0000 mL | Freq: Once | INTRAMUSCULAR | Status: AC | PRN
  Administered 2011-12-14: 125 mL via INTRAVENOUS

## 2011-12-14 MED ORDER — IOHEXOL 300 MG/ML  SOLN
30.0000 mL | Freq: Once | INTRAMUSCULAR | Status: AC | PRN
  Administered 2011-12-14: 30 mL via ORAL

## 2011-12-15 ENCOUNTER — Telehealth (INDEPENDENT_AMBULATORY_CARE_PROVIDER_SITE_OTHER): Payer: Self-pay

## 2011-12-15 ENCOUNTER — Other Ambulatory Visit: Payer: BC Managed Care – PPO

## 2011-12-15 NOTE — Telephone Encounter (Signed)
Message copied by Ivory Broad on Thu Dec 15, 2011  1:57 PM ------      Message from: Zacarias Pontes      Created: Thu Dec 15, 2011  9:38 AM       PT WOULD LIKE TO KNOW RESULTS OF CT SCAN PLEASE CALL  WORK # [phone] AND ASK FOR MELISSA IN RECEIVING.Marland Kitchen

## 2011-12-15 NOTE — Telephone Encounter (Signed)
I called the patient and gave her the results.  I told her to keep her appointment for Tuesday with Dr Andrey Campanile because he will look at the films and give her details about if this a recurrence and what needs to be done.

## 2011-12-20 ENCOUNTER — Ambulatory Visit (INDEPENDENT_AMBULATORY_CARE_PROVIDER_SITE_OTHER): Payer: BC Managed Care – PPO | Admitting: General Surgery

## 2011-12-20 ENCOUNTER — Encounter (INDEPENDENT_AMBULATORY_CARE_PROVIDER_SITE_OTHER): Payer: Self-pay | Admitting: General Surgery

## 2011-12-20 VITALS — BP 130/78 | HR 96 | Temp 97.5°F | Ht 66.0 in | Wt 313.2 lb

## 2011-12-20 DIAGNOSIS — IMO0002 Reserved for concepts with insufficient information to code with codable children: Secondary | ICD-10-CM

## 2011-12-20 DIAGNOSIS — S39011A Strain of muscle, fascia and tendon of abdomen, initial encounter: Secondary | ICD-10-CM | POA: Insufficient documentation

## 2011-12-20 MED ORDER — IBUPROFEN 800 MG PO TABS: 800.0000 mg | ORAL_TABLET | Freq: Three times a day (TID) | ORAL | Status: AC | PRN

## 2011-12-20 NOTE — Patient Instructions (Signed)
Muscle Strain A muscle strain, or pulled muscle, occurs when a muscle is over-stretched. A small number of muscle fibers may also be torn. This is especially common in athletes. This happens when a sudden violent force placed on a muscle pushes it past its capacity. Usually, recovery from a pulled muscle takes 1 to 2 weeks. But complete healing will take 5 to 6 weeks. There are millions of muscle fibers. Following injury, your body will usually return to normal quickly. HOME CARE INSTRUCTIONS   While awake, apply ice to the sore muscle for 15 to 20 minutes each hour for the first 2 days. Put ice in a plastic bag and place a towel between the bag of ice and your skin.   Do not use the pulled muscle for several days. Do not use the muscle if you have pain.   You may wrap the injured area with an elastic bandage for comfort. Be careful not to bind it too tightly. This may interfere with blood circulation.   Only take over-the-counter or prescription medicines for pain, discomfort, or fever as directed by your caregiver. Do not use aspirin as this will increase bleeding (bruising) at injury site.   Warming up before exercise helps prevent muscle strains.  SEEK MEDICAL CARE IF:  There is increased pain or swelling in the affected area. MAKE SURE YOU:   Understand these instructions.   Will watch your condition.   Will get help right away if you are not doing well or get worse.  Document Released: 04/18/2005 Document Revised: 04/07/2011 Document Reviewed: 11/15/2006 [employer] Patient Information 2012 [employer]. 

## 2011-12-20 NOTE — Progress Notes (Signed)
Subjective:     Patient ID: Kathleen Walsh, female   DOB: [birth year], 31 y.o.   MRN: [id number]  HPI 31 year old Caucasian female comes in for followup after  undergoing a laparoscopic ventral hernia repair in May 2012. She was seen in urgent office last week by Dr. Magnus Ivan for abdominal pain. She underwent a CT scan and is here for followup. She complains of lower abdominal pain. She says the pain is essentially in a circle around her umbilicus.She states that she was doing okay until she helped her father move a heavy piece of furniture about a week and a half ago. She had new onset abdominal wall pain afterward. She denies any fever, chills, nausea, vomiting, diarrhea or constipation. She rates the pain about a 6/10 at rest. With activity the pain increases to about 10 out of 10. She had been given a prescription of pain pills but they just make her nauseous   PMHx, PSHx, SOCHx, FAMHx, ALL reviewed and unchanged   Review of Systems 10 point ROS negative except for what is mentioned in HPI    Objective:   Physical Exam BP 130/78  Pulse 96  Temp 97.5 F (36.4 C) (Temporal)  Ht 5\' 6"  (1.676 m)  Wt 313 lb 3.2 oz (142.067 kg)  BMI 50.55 kg/m2  LMP 11/15/2011 Morbidly obese Caucasian female in no apparent distress Abdomen-soft, nondistended, obese, well-healed incision. No overt evidence of fascial defect. Mild tenderness to palpation Skin - no jaundice, edema Psych- alert, ox3, approp Neuro - nonfocal    Assessment:     Status post laparoscopic ventral hernia repair over a year ago with abdominal wall strain    Plan:     I reviewed the CT scan she had done on August 9 along with her prior CT scan from back in the spring. Although radiology called a recurrent hernia, I do not believe she has a recurrent supraumbilical hernia. I reviewed the CT scan with Dr. Richarda Overlie. It appears that the mesh is completely intact. There is a retained small piece of fat in the hernia sac. I believe her  symptoms are more consistent with abdominal wall strain from the 12 trans-fascial sutures were placed to secure the mesh. I recommended light activity for the next 4-6 weeks. She was also given a prescription for Motrin 800 mg 3 times a day as needed for pain. I reviewed her CT scan with her. Followup 6-8 weeks  Mary Sella. Andrey Campanile, MD, FACS General, Bariatric, & Minimally Invasive Surgery [facility], [place]

## 2012-01-27 ENCOUNTER — Encounter (INDEPENDENT_AMBULATORY_CARE_PROVIDER_SITE_OTHER): Payer: BC Managed Care – PPO | Admitting: General Surgery

## 2012-03-01 ENCOUNTER — Encounter (INDEPENDENT_AMBULATORY_CARE_PROVIDER_SITE_OTHER): Payer: BC Managed Care – PPO | Admitting: General Surgery

## 2012-05-02 ENCOUNTER — Encounter (HOSPITAL_COMMUNITY): Payer: Self-pay | Admitting: *Deleted

## 2012-05-02 ENCOUNTER — Emergency Department (HOSPITAL_COMMUNITY)
Admission: EM | Admit: 2012-05-02 | Discharge: 2012-05-02 | Disposition: A | Discharge status: Home / Self Care | Payer: BC Managed Care – PPO | Source: Home / Self Care | Attending: Family Medicine | Admitting: Family Medicine

## 2012-05-02 DIAGNOSIS — K089 Disorder of teeth and supporting structures, unspecified: Secondary | ICD-10-CM

## 2012-05-02 DIAGNOSIS — K0889 Other specified disorders of teeth and supporting structures: Secondary | ICD-10-CM

## 2012-05-02 MED ORDER — DICLOFENAC POTASSIUM 50 MG PO TABS: 50.0000 mg | ORAL_TABLET | Freq: Three times a day (TID) | ORAL | Status: DC

## 2012-05-02 MED ORDER — CLINDAMYCIN HCL 150 MG PO CAPS: 150.0000 mg | ORAL_CAPSULE | Freq: Four times a day (QID) | ORAL | Status: DC

## 2012-05-02 NOTE — ED Provider Notes (Signed)
History     [name]: [id number]  Arrival date & time 05/02/12  1221   First MD Initiated Contact with Patient 05/02/12 1348      Chief Complaint  Patient presents with  . Dental Pain    (Consider location/radiation/quality/duration/timing/severity/associated sxs/prior treatment) Patient is a 31 y.o. female presenting with tooth pain. The history is provided by the patient.  Dental PainThe primary symptoms include mouth pain. Primary symptoms do not include fever or sore throat. The symptoms began 2 days ago. The symptoms are worsening.  Additional symptoms include: jaw pain. Additional symptoms do not include: gum swelling, facial swelling and ear pain. Medical issues include: periodontal disease.    Past Medical History  Diagnosis Date  . Hernia   . H/O colonoscopy 2008    polyp removed    Past Surgical History  Procedure Date  . Dilation and curettage of uterus 2004  . Foot surgery 2008  . Ventral hernia repair 09/20/10    Laparoscopic, Dr Gaynelle Adu  . Hernia repair 09/19/2011    Family History  Problem Relation Age of Onset  . Diabetes Mother   . Hypertension Mother   . Diabetes Father   . Hypertension Father     History  Substance Use Topics  . Smoking status: Current Every Day Smoker -- 0.3 packs/day    Types: Cigarettes  . Smokeless tobacco: Never Used  . Alcohol Use: Yes     Comment: "once every three years"     OB History    Grav Para Term Preterm Abortions TAB SAB Ect Mult Living                  Review of Systems  Constitutional: Negative.  Negative for fever.  HENT: Positive for dental problem. Negative for ear pain, sore throat and facial swelling.   Gastrointestinal: Negative.   Skin: Negative.     Allergies  Review of patient's allergies indicates no known allergies.  Home Medications   Current Outpatient Rx  Name  Route  Sig  Dispense  Refill  . CLINDAMYCIN HCL 150 MG PO CAPS   Oral   Take 1 capsule (150 mg total) by mouth 4 (four)  times daily.   28 capsule   0   . DICLOFENAC POTASSIUM 50 MG PO TABS   Oral   Take 1 tablet (50 mg total) by mouth 3 (three) times daily. For dental pain   15 tablet   0   . FUROSEMIDE 20 MG PO TABS   Oral   Take 20 mg by mouth daily.         Marland Kitchen HYDROCODONE-ACETAMINOPHEN 5-500 MG PO TABS   Oral   Take 1 tablet by mouth every 6 (six) hours as needed. For pain         . METFORMIN HCL ER 500 MG PO TB24   Oral   Take 500 mg by mouth daily with breakfast.         . RANITIDINE HCL 150 MG PO TABS   Oral   Take 150 mg by mouth 2 (two) times daily as needed. For acid reflux         . RIZATRIPTAN BENZOATE 10 MG PO TBDP                 BP 137/61  Pulse 100  Temp 98.1 F (36.7 C) (Oral)  Resp 18  SpO2 100%  Physical Exam  Nursing note and vitals reviewed. Constitutional: She appears well-developed and well-nourished.  HENT:  Head: Normocephalic.  Right Ear: External ear normal.  Left Ear: External ear normal.  Mouth/Throat: Oropharynx is clear and moist. Abnormal dentition. Dental caries present. No posterior oropharyngeal edema or posterior oropharyngeal erythema.    Eyes: Conjunctivae normal are normal. Pupils are equal, round, and reactive to light.  Neck: Normal range of motion. Neck supple.  Lymphadenopathy:    She has no cervical adenopathy.    ED Course  Procedures (including critical care time)  Labs Reviewed - No data to display No results found.   1. Pain, dental       MDM          Linna Hoff, MD 05/02/12 [phone]

## 2012-05-02 NOTE — ED Notes (Signed)
Pt  Reports   Symptoms  Of   Toothache         X  2  Days  Pt  Is  Taking  Septra  Ds  For  A  persumed  uti   She is  sittintg  Upright  On  Exam table  Speaking in  Complete  sentances

## 2012-05-17 ENCOUNTER — Encounter (HOSPITAL_COMMUNITY): Payer: Self-pay | Admitting: *Deleted

## 2012-05-17 ENCOUNTER — Emergency Department (HOSPITAL_COMMUNITY)
Admission: EM | Admit: 2012-05-17 | Discharge: 2012-05-18 | Disposition: A | Discharge status: Home / Self Care | Payer: BC Managed Care – PPO | Attending: Emergency Medicine | Admitting: Emergency Medicine

## 2012-05-17 DIAGNOSIS — F17200 Nicotine dependence, unspecified, uncomplicated: Secondary | ICD-10-CM | POA: Insufficient documentation

## 2012-05-17 DIAGNOSIS — N83209 Unspecified ovarian cyst, unspecified side: Secondary | ICD-10-CM | POA: Insufficient documentation

## 2012-05-17 DIAGNOSIS — R197 Diarrhea, unspecified: Secondary | ICD-10-CM | POA: Insufficient documentation

## 2012-05-17 DIAGNOSIS — Z79899 Other long term (current) drug therapy: Secondary | ICD-10-CM | POA: Insufficient documentation

## 2012-05-17 DIAGNOSIS — Z8719 Personal history of other diseases of the digestive system: Secondary | ICD-10-CM | POA: Insufficient documentation

## 2012-05-17 DIAGNOSIS — R630 Anorexia: Secondary | ICD-10-CM | POA: Insufficient documentation

## 2012-05-17 DIAGNOSIS — R509 Fever, unspecified: Secondary | ICD-10-CM | POA: Insufficient documentation

## 2012-05-17 DIAGNOSIS — R112 Nausea with vomiting, unspecified: Secondary | ICD-10-CM | POA: Insufficient documentation

## 2012-05-17 LAB — CBC WITH DIFFERENTIAL/PLATELET
Basophils Absolute: 0.1 10*3/uL (ref 0.0–0.1)
Basophils Relative: 0 % (ref 0–1)
Eosinophils Absolute: 0.2 10*3/uL (ref 0.0–0.7)
Lymphs Abs: 2.8 10*3/uL (ref 0.7–4.0)
MCH: 22.9 pg — ABNORMAL LOW (ref 26.0–34.0)
Neutrophils Relative %: 73 % (ref 43–77)
Platelets: 263 10*3/uL (ref 150–400)
RBC: 5.32 MIL/uL — ABNORMAL HIGH (ref 3.87–5.11)
RDW: 17.9 % — ABNORMAL HIGH (ref 11.5–15.5)

## 2012-05-17 LAB — URINALYSIS, ROUTINE W REFLEX MICROSCOPIC
Bilirubin Urine: NEGATIVE
Hgb urine dipstick: NEGATIVE
Specific Gravity, Urine: 1.026 (ref 1.005–1.030)
pH: 5 (ref 5.0–8.0)

## 2012-05-17 LAB — COMPREHENSIVE METABOLIC PANEL
ALT: 12 U/L (ref 0–35)
AST: 9 U/L (ref 0–37)
Albumin: 3.3 g/dL — ABNORMAL LOW (ref 3.5–5.2)
Alkaline Phosphatase: 84 U/L (ref 39–117)
Potassium: 3.8 mEq/L (ref 3.5–5.1)
Sodium: 136 mEq/L (ref 135–145)
Total Protein: 6.9 g/dL (ref 6.0–8.3)

## 2012-05-17 LAB — POCT PREGNANCY, URINE: Preg Test, Ur: NEGATIVE

## 2012-05-17 MED ORDER — IOHEXOL 300 MG/ML  SOLN
50.0000 mL | Freq: Once | INTRAMUSCULAR | Status: AC | PRN
  Administered 2012-05-17: 50 mL via ORAL

## 2012-05-17 MED ORDER — ONDANSETRON HCL 4 MG/2ML IJ SOLN
4.0000 mg | Freq: Once | INTRAMUSCULAR | Status: AC
  Administered 2012-05-17: 4 mg via INTRAVENOUS
  Filled 2012-05-17: qty 2

## 2012-05-17 MED ORDER — HYDROMORPHONE HCL PF 1 MG/ML IJ SOLN
1.0000 mg | Freq: Once | INTRAMUSCULAR | Status: AC
  Administered 2012-05-17: 1 mg via INTRAVENOUS
  Filled 2012-05-17: qty 1

## 2012-05-17 MED ORDER — SODIUM CHLORIDE 0.9 % IV BOLUS (SEPSIS)
1000.0000 mL | Freq: Once | INTRAVENOUS | Status: AC
  Administered 2012-05-17: 1000 mL via INTRAVENOUS

## 2012-05-17 NOTE — ED Provider Notes (Signed)
History     [name]: [id number]  Arrival date & time 05/17/12  2003   First MD Initiated Contact with Patient 05/17/12 2213      Chief Complaint  Patient presents with  . Abdominal Pain    (Consider location/radiation/quality/duration/timing/severity/associated sxs/prior treatment) Patient is a 32 y.o. female presenting with abdominal pain. The history is provided by the patient.  Abdominal Pain The primary symptoms of the illness include abdominal pain, fever, nausea, vomiting and diarrhea. The primary symptoms of the illness do not include dysuria, vaginal discharge or vaginal bleeding. The current episode started yesterday. The onset of the illness was gradual.  The abdominal pain is located in the RLQ. The abdominal pain radiates to the periumbilical region. The severity of the abdominal pain is 9/10. The abdominal pain is relieved by being still. The abdominal pain is exacerbated by vomiting, movement and eating.  The vomiting began yesterday. Vomiting occurs 2 to 5 times per day. The emesis contains stomach contents.  The diarrhea began yesterday. The diarrhea is watery.  Additional symptoms associated with the illness include chills and anorexia. Symptoms associated with the illness do not include urgency, frequency or back pain.    Past Medical History  Diagnosis Date  . Hernia   . H/O colonoscopy 2008    polyp removed    Past Surgical History  Procedure Date  . Dilation and curettage of uterus 2004  . Foot surgery 2008  . Ventral hernia repair 09/20/10    Laparoscopic, Dr Gaynelle Adu  . Hernia repair 09/19/2011    Family History  Problem Relation Age of Onset  . Diabetes Mother   . Hypertension Mother   . Diabetes Father   . Hypertension Father     History  Substance Use Topics  . Smoking status: Current Every Day Smoker -- 0.3 packs/day    Types: Cigarettes  . Smokeless tobacco: Never Used  . Alcohol Use: Yes     Comment: "once every three years"     OB  History    Grav Para Term Preterm Abortions TAB SAB Ect Mult Living                  Review of Systems  Constitutional: Positive for fever and chills.  Gastrointestinal: Positive for nausea, vomiting, abdominal pain, diarrhea and anorexia.  Genitourinary: Negative for dysuria, urgency, frequency, vaginal bleeding and vaginal discharge.  Musculoskeletal: Negative for back pain.  All other systems reviewed and are negative.    Allergies  Milk-related compounds  Home Medications   Current Outpatient Rx  Name  Route  Sig  Dispense  Refill  . CLINDAMYCIN HCL 150 MG PO CAPS   Oral   Take 150 mg by mouth 4 (four) times daily. Start date 05/02/12; Pt has been non-compliant with this medication stating that she has not taken it consistently as prescribed         . HYDROCODONE-ACETAMINOPHEN 5-500 MG PO TABS   Oral   Take 1 tablet by mouth every 6 (six) hours as needed. For pain         . RANITIDINE HCL 150 MG PO TABS   Oral   Take 150 mg by mouth 2 (two) times daily as needed. For acid reflux         . RIZATRIPTAN BENZOATE 10 MG PO TBDP   Oral   Take 10 mg by mouth daily as needed. For migraine           BP 106/64  Pulse 109  Temp 99.7 F (37.6 C) (Oral)  Resp 16  SpO2 99%  Physical Exam  Nursing note and vitals reviewed. Constitutional: She is oriented to person, place, and time. She appears well-developed and well-nourished. No distress.  HENT:  Head: Normocephalic and atraumatic.  Mouth/Throat: Oropharynx is clear and moist.  Eyes: Conjunctivae normal and EOM are normal. Pupils are equal, round, and reactive to light.  Neck: Normal range of motion. Neck supple.  Cardiovascular: Normal rate, regular rhythm and intact distal pulses.   No murmur heard. Pulmonary/Chest: Effort normal and breath sounds normal. No respiratory distress. She has no wheezes. She has no rales.  Abdominal: Soft. She exhibits no distension. There is tenderness in the right lower quadrant.  There is rebound and guarding. There is no CVA tenderness.  Musculoskeletal: Normal range of motion. She exhibits no edema and no tenderness.  Neurological: She is alert and oriented to person, place, and time.  Skin: Skin is warm and dry. No rash noted. No erythema.  Psychiatric: She has a normal mood and affect. Her behavior is normal.    ED Course  Procedures (including critical care time)  Labs Reviewed  CBC WITH DIFFERENTIAL - Abnormal; Notable for the following:    WBC 14.3 (*)     RBC 5.32 (*)     MCV 72.2 (*)     MCH 22.9 (*)     RDW 17.9 (*)     Neutro Abs 10.5 (*)     All other components within normal limits  COMPREHENSIVE METABOLIC PANEL - Abnormal; Notable for the following:    Glucose, Bld 131 (*)     Albumin 3.3 (*)     Total Bilirubin 0.2 (*)     All other components within normal limits  LIPASE, BLOOD  URINALYSIS, ROUTINE W REFLEX MICROSCOPIC  POCT PREGNANCY, URINE   No results found.   No diagnosis found.    MDM   Patient with right lower quadrant pain with guarding and rebound with a leukocytosis and persistent nausea that started yesterday. Patient has a long history of upper abdominal pain due to ventral hernia and mesh repair however she states there's no pain there and is on the right lower quadrant. No prior history of ovarian issues.    CMP wnl.  CT abd/pelvis pending.       Gwyneth Sprout, MD 05/18/12 [phone]

## 2012-05-17 NOTE — ED Notes (Signed)
Pt states diarrhea yesterday at work and had to leave work. Pt states today her right lower quadrant started hurting to the point where she can barely stand straight up. Pt states pain is sharp and movement makes it worse. Pt states pressing down on pain makes it better but releasing pressure make it worse. Pt still has appendix. Pt state after eating she has cramping in her stomach and nausea.

## 2012-05-18 ENCOUNTER — Emergency Department (HOSPITAL_COMMUNITY): Payer: BC Managed Care – PPO

## 2012-05-18 ENCOUNTER — Encounter (HOSPITAL_COMMUNITY): Payer: Self-pay | Admitting: Radiology

## 2012-05-18 MED ORDER — HYDROCODONE-ACETAMINOPHEN 5-325 MG PO TABS: 2.0000 | ORAL_TABLET | ORAL | Status: DC | PRN

## 2012-05-18 MED ORDER — IOHEXOL 300 MG/ML  SOLN
100.0000 mL | Freq: Once | INTRAMUSCULAR | Status: AC | PRN
  Administered 2012-05-18: 100 mL via INTRAVENOUS

## 2012-05-18 NOTE — ED Notes (Signed)
Pt r/t from ct.  Pt states feeling ok and tolerated ct without dif.

## 2012-05-18 NOTE — ED Provider Notes (Signed)
Pt signed out pending ct.  Ct + ovarian cyst.  Pt pain improved.  No sxs of torsion. Will dc to oupt fu with analgesia,  Ret new/worsening sxs   Lytle Michaels, MD 05/18/12 0148

## 2012-05-18 NOTE — ED Notes (Signed)
Dc to home.  Pt states understanding to dc paperwork.  Pt states will f/u with pcp/ obgyn as directed.  Pt ambulatory to exit without difficulty.  Denies need for w/c.

## 2012-06-08 ENCOUNTER — Emergency Department (HOSPITAL_COMMUNITY)
Admission: EM | Admit: 2012-06-08 | Discharge: 2012-06-08 | Disposition: A | Discharge status: Home / Self Care | Payer: BC Managed Care – PPO | Source: Home / Self Care | Attending: Emergency Medicine | Admitting: Emergency Medicine

## 2012-06-08 ENCOUNTER — Encounter (HOSPITAL_COMMUNITY): Payer: Self-pay

## 2012-06-08 DIAGNOSIS — J040 Acute laryngitis: Secondary | ICD-10-CM

## 2012-06-08 MED ORDER — PREDNISONE 20 MG PO TABS: 20.0000 mg | ORAL_TABLET | Freq: Two times a day (BID) | ORAL | Status: DC

## 2012-06-08 MED ORDER — BENZONATATE 200 MG PO CAPS: 200.0000 mg | ORAL_CAPSULE | Freq: Three times a day (TID) | ORAL | Status: DC | PRN

## 2012-06-08 MED ORDER — FEXOFENADINE-PSEUDOEPHED ER 60-120 MG PO TB12: 1.0000 | ORAL_TABLET | Freq: Two times a day (BID) | ORAL | Status: DC

## 2012-06-08 NOTE — ED Notes (Signed)
Reports she has been having cough since Monday,w green secretions , lost voice; productive sounding cough

## 2012-06-08 NOTE — ED Provider Notes (Signed)
Chief Complaint  Patient presents with  . Cough    History of Present Illness:   Kathleen Walsh  is a 32 year old female who presents with a five-day history of scratchy throat, sore throat, hoarseness, cough productive of green sputum, wheezing, nasal congestion with yellow rhinorrhea, maxillary pressure, bilateral ear pressure, has felt hot and cold, and has noticed some swelling in her neck. She denies fever or GI symptoms. She has not been exposed to anything in particular and has not taken any medication for symptom relief.  Review of Systems:  Other than noted above, the patient denies any of the following symptoms. Systemic:  No fever, chills, sweats, fatigue, myalgias, headache, or anorexia. Eye:  No redness, pain or drainage. ENT:  No earache, ear congestion, nasal congestion, sneezing, rhinorrhea, sinus pressure, sinus pain, post nasal drip, or sore throat. Lungs:  No cough, sputum production, wheezing, shortness of breath, or chest pain. GI:  No abdominal pain, nausea, vomiting, or diarrhea.  PMFSH:  Past medical history, family history, social history, meds, and allergies were reviewed.  Physical Exam:   Vital signs:  BP 127/77  Pulse 92  Temp 98.5 F (36.9 C) (Oral)  Resp 22  SpO2 98%  LMP 04/24/2012 General:  Alert, in no distress. She is of course and can barely talk about whisper. Eye:  No conjunctival injection or drainage. Lids were normal. ENT:  TMs and canals were normal, without erythema or inflammation.  Nasal mucosa was clear and uncongested, without drainage.  Mucous membranes were moist.  Pharynx was clear, without exudate or drainage.  There were no oral ulcerations or lesions. Neck:  Supple, no adenopathy, tenderness or mass. Lungs:  No respiratory distress.  Lungs were clear to auscultation, without wheezes, rales or rhonchi.  Breath sounds were clear and equal bilaterally.  Heart:  Regular rhythm, without gallops, murmers or rubs. Skin:  Clear, warm, and  dry, without rash or lesions.  Assessment:  The encounter diagnosis was Laryngitis.  Plan:   1.  The following meds were prescribed:   New Prescriptions   BENZONATATE (TESSALON) 200 MG CAPSULE    Take 1 capsule (200 mg total) by mouth 3 (three) times daily as needed for cough.   FEXOFENADINE-PSEUDOEPHEDRINE (ALLEGRA-D) 60-120 MG PER TABLET    Take 1 tablet by mouth every 12 (twelve) hours.   PREDNISONE (DELTASONE) 20 MG TABLET    Take 1 tablet (20 mg total) by mouth 2 (two) times daily.   2.  The patient was instructed in symptomatic care and handouts were given. 3.  The patient was told to return if becoming worse in any way, if no better in 3 or 4 days, and given some red flag symptoms that would indicate earlier return.   Reuben Likes, MD 06/08/12 2012

## 2012-08-09 LAB — OB RESULTS CONSOLE ABO/RH: RH Type: POSITIVE

## 2012-08-09 LAB — OB RESULTS CONSOLE RUBELLA ANTIBODY, IGM: Rubella: IMMUNE

## 2012-08-09 LAB — OB RESULTS CONSOLE HIV ANTIBODY (ROUTINE TESTING)
HIV: NONREACTIVE
HIV: NONREACTIVE
HIV: NONREACTIVE

## 2012-08-09 LAB — OB RESULTS CONSOLE ANTIBODY SCREEN: Antibody Screen: NEGATIVE

## 2012-08-09 LAB — OB RESULTS CONSOLE HEPATITIS B SURFACE ANTIGEN: Hepatitis B Surface Ag: NEGATIVE

## 2013-01-16 ENCOUNTER — Other Ambulatory Visit: Payer: Self-pay | Admitting: Obstetrics and Gynecology

## 2013-01-28 LAB — OB RESULTS CONSOLE RPR: RPR: NONREACTIVE

## 2013-02-05 ENCOUNTER — Encounter (HOSPITAL_COMMUNITY): Payer: Self-pay | Admitting: Pharmacist

## 2013-02-06 ENCOUNTER — Encounter (HOSPITAL_COMMUNITY): Payer: Self-pay

## 2013-02-07 ENCOUNTER — Other Ambulatory Visit: Payer: Self-pay | Admitting: Obstetrics and Gynecology

## 2013-02-07 NOTE — Patient Instructions (Addendum)
Your procedure is scheduled on: 02/11/2013  Enter through the Main Entrance of [facility] at: 0600AM  Pick up the phone at the desk and dial [phone].  Call this number if you have problems the morning of surgery: [phone].  Remember: Do NOT eat food: AFTER MIDNIGHT 02/10/2013 Do NOT drink clear liquids after: AFTER MIDNIGHT 02/10/2013 Take these medicines the morning of surgery with a SIP OF WATER: none  Do NOT wear jewelry (body piercing), make-up, or nail polish. Do NOT wear lotions, powders, or perfumes.  You may wear deoderant. Do NOT shave for 48 hours prior to surgery. Do NOT bring valuables to the hospital. Contacts, dentures, or bridgework may not be worn into surgery. Leave suitcase in car.  After surgery it may be brought to your room.  For patients admitted to the hospital, checkout time is 11:00 AM the day of discharge.

## 2013-02-08 ENCOUNTER — Encounter (HOSPITAL_COMMUNITY): Payer: Self-pay

## 2013-02-08 ENCOUNTER — Encounter (HOSPITAL_COMMUNITY)
Admission: RE | Admit: 2013-02-08 | Discharge: 2013-02-08 | Disposition: A | Discharge status: Home / Self Care | Payer: BC Managed Care – PPO | Source: Ambulatory Visit | Attending: Obstetrics and Gynecology | Admitting: Obstetrics and Gynecology

## 2013-02-08 ENCOUNTER — Encounter (INDEPENDENT_AMBULATORY_CARE_PROVIDER_SITE_OTHER): Payer: Self-pay

## 2013-02-08 DIAGNOSIS — Z01812 Encounter for preprocedural laboratory examination: Secondary | ICD-10-CM | POA: Insufficient documentation

## 2013-02-08 HISTORY — DX: Sleep apnea, unspecified: G47.30

## 2013-02-08 HISTORY — DX: Unspecified osteoarthritis, unspecified site: M19.90

## 2013-02-08 HISTORY — DX: Disease of blood and blood-forming organs, unspecified: D75.9

## 2013-02-08 HISTORY — DX: Headache: R51

## 2013-02-08 LAB — COMPREHENSIVE METABOLIC PANEL
AST: 9 U/L (ref 0–37)
Albumin: 2.4 g/dL — ABNORMAL LOW (ref 3.5–5.2)
Alkaline Phosphatase: 163 U/L — ABNORMAL HIGH (ref 39–117)
Chloride: 104 mEq/L (ref 96–112)
Creatinine, Ser: 0.45 mg/dL — ABNORMAL LOW (ref 0.50–1.10)
Total Bilirubin: 0.2 mg/dL — ABNORMAL LOW (ref 0.3–1.2)

## 2013-02-08 LAB — CBC
HCT: 35.7 % — ABNORMAL LOW (ref 36.0–46.0)
Hemoglobin: 11.4 g/dL — ABNORMAL LOW (ref 12.0–15.0)
MCH: 24.3 pg — ABNORMAL LOW (ref 26.0–34.0)
MCHC: 31.9 g/dL (ref 30.0–36.0)
MCV: 76 fL — ABNORMAL LOW (ref 78.0–100.0)
RBC: 4.7 MIL/uL (ref 3.87–5.11)

## 2013-02-08 LAB — ABO/RH: ABO/RH(D): A POS

## 2013-02-08 LAB — TYPE AND SCREEN: ABO/RH(D): A POS

## 2013-02-10 MED ORDER — DEXTROSE 5 % IV SOLN
3.0000 g | INTRAVENOUS | Status: AC
  Administered 2013-02-11: 3 g via INTRAVENOUS
  Filled 2013-02-10: qty 3000

## 2013-02-10 NOTE — H&P (Signed)
Kathleen Walsh, Kathleen Walsh NO.:  [id number]  MEDICAL RECORD NO.:  [id number]  LOCATION:  [facility]                         FACILITY:  [facility]  PHYSICIAN:  Lenoard Aden, M.D.DATE OF BIRTH:  1981/04/12  DATE OF ADMISSION:  01/15/2013 DATE OF DISCHARGE:                             HISTORY & PHYSICAL   CHIEF COMPLAINT:  Class A2 diabetes mellitus, antiphospholipid antibody syndrome with oligohydramnios for repeat C-section and tubal ligation at 37+ weeks due to oligohydramnios, known antiphospholipid antibody syndrome and poorly controlled diabetes. Unable to do amnio due to low amniotic fluid volume.Fetal surveillance, otherwise has been reassureing.  Her most recent ultrasound was performed on February 05, 2013, with a BPP 8/8 and transverse lie. Case management discussed with MFM.  MEDICATIONS:  Lovenox, which has been discontinued for 36 hours, Glucophage,  Vicodin p.r.n. pain,  Fioricet p.r.n. headache,  Zantac b.i.d.  She has a personal history of diabetes, previous history of ventral hernia, depression, and diabetes as noted.  She has no known drug allergies.  FAMILY HISTORY:  Family history of diabetes, breast cancer, glaucoma, hypertension.  Her pregnancy is remarkable for SAB x2 and previous C- section.  PAST SURGICAL HISTORY:  Remarkable for previous C-section, hernia repair, foot surgery, and D and C x2.  PHYSICAL EXAMINATION:  GENERAL:  She is a well-developed, well- nourished, white female. VITAL SIGNS:  Height of 66 inches, weight of 230 pounds. HEENT:  Normal. NECK:  Supple.  Full range of motion. LUNGS:  Clear. HEART:  Regular rate and rhythm. ABDOMEN:  Soft, gravid, nontender. EXTREMITIES:  NO cc\/c/e VE: Ft, soft, 80%, transverse presenting -2.  IMPRESSION: 1. A 37-1/2-week intrauterine pregnancy. 2. Antiphospholipid antibody syndrome, on Lovenox. 3. Class A2 diabetes mellitus, on Glucophage. 4. Unstable fetal lie. 5. Oligohydramnios 6.  Morbid obesity 7. Previous csection for rpt and TL  PLAN:  Proceed with repeat low segment transverse cesarean section, tubal ligation.  Risks include anesthesia, infection, bleeding, injury to surrounding organs, need for repair was discussed, delayed versus immediate complications to include bowel and bladder injury noted. Failure risk of tubal ligation of 5-01/999 discussed.  The patient acknowledges and wishes to proceed.     Lenoard Aden, M.D.     RJT/MEDQ  D:  02/10/2013  T:  02/10/2013  Job:  [id number]

## 2013-02-11 ENCOUNTER — Encounter (HOSPITAL_COMMUNITY): Payer: BC Managed Care – PPO | Admitting: Certified Registered"

## 2013-02-11 ENCOUNTER — Inpatient Hospital Stay (HOSPITAL_COMMUNITY): Payer: BC Managed Care – PPO | Admitting: Certified Registered"

## 2013-02-11 ENCOUNTER — Inpatient Hospital Stay (HOSPITAL_COMMUNITY)
Admission: RE | Admit: 2013-02-11 | Discharge: 2013-02-14 | DRG: 370 | Disposition: A | Discharge status: Home / Self Care | Payer: BC Managed Care – PPO | Source: Ambulatory Visit | Attending: Obstetrics and Gynecology | Admitting: Obstetrics and Gynecology

## 2013-02-11 ENCOUNTER — Encounter (HOSPITAL_COMMUNITY)
Admission: RE | Disposition: A | Discharge status: Home / Self Care | Payer: Self-pay | Source: Ambulatory Visit | Attending: Obstetrics and Gynecology

## 2013-02-11 ENCOUNTER — Encounter (HOSPITAL_COMMUNITY): Payer: Self-pay | Admitting: *Deleted

## 2013-02-11 DIAGNOSIS — E669 Obesity, unspecified: Secondary | ICD-10-CM | POA: Diagnosis present

## 2013-02-11 DIAGNOSIS — B373 Candidiasis of vulva and vagina: Secondary | ICD-10-CM | POA: Diagnosis present

## 2013-02-11 DIAGNOSIS — O4100X0 Oligohydramnios, unspecified trimester, not applicable or unspecified: Secondary | ICD-10-CM | POA: Diagnosis present

## 2013-02-11 DIAGNOSIS — D689 Coagulation defect, unspecified: Secondary | ICD-10-CM | POA: Diagnosis present

## 2013-02-11 DIAGNOSIS — O34219 Maternal care for unspecified type scar from previous cesarean delivery: Principal | ICD-10-CM | POA: Diagnosis present

## 2013-02-11 DIAGNOSIS — O2390 Unspecified genitourinary tract infection in pregnancy, unspecified trimester: Secondary | ICD-10-CM | POA: Diagnosis present

## 2013-02-11 DIAGNOSIS — E119 Type 2 diabetes mellitus without complications: Secondary | ICD-10-CM | POA: Diagnosis present

## 2013-02-11 DIAGNOSIS — B3731 Acute candidiasis of vulva and vagina: Secondary | ICD-10-CM | POA: Diagnosis present

## 2013-02-11 DIAGNOSIS — O322XX0 Maternal care for transverse and oblique lie, not applicable or unspecified: Secondary | ICD-10-CM | POA: Diagnosis present

## 2013-02-11 DIAGNOSIS — O2432 Unspecified pre-existing diabetes mellitus in childbirth: Secondary | ICD-10-CM | POA: Diagnosis present

## 2013-02-11 LAB — GLUCOSE, CAPILLARY
Glucose-Capillary: 110 mg/dL — ABNORMAL HIGH (ref 70–99)
Glucose-Capillary: 109 mg/dL — ABNORMAL HIGH (ref 70–99)

## 2013-02-11 LAB — OB RESULTS CONSOLE RPR: RPR: NONREACTIVE

## 2013-02-11 SURGERY — Surgical Case
Anesthesia: Spinal | Site: Abdomen | Laterality: Bilateral | Wound class: Clean Contaminated

## 2013-02-11 MED ORDER — NALOXONE HCL 0.4 MG/ML IJ SOLN: 0.4000 mg | INTRAMUSCULAR | Status: DC | PRN

## 2013-02-11 MED ORDER — ONDANSETRON HCL 4 MG/2ML IJ SOLN: 4.0000 mg | Freq: Three times a day (TID) | INTRAMUSCULAR | Status: DC | PRN

## 2013-02-11 MED ORDER — SODIUM CHLORIDE 0.9 % IJ SOLN: 3.0000 mL | INTRAMUSCULAR | Status: DC | PRN

## 2013-02-11 MED ORDER — ONDANSETRON HCL 4 MG/2ML IJ SOLN
INTRAMUSCULAR | Status: AC
  Filled 2013-02-11: qty 2

## 2013-02-11 MED ORDER — MEPERIDINE HCL 25 MG/ML IJ SOLN: 6.2500 mg | INTRAMUSCULAR | Status: DC | PRN

## 2013-02-11 MED ORDER — DIPHENHYDRAMINE HCL 25 MG PO CAPS
25.0000 mg | ORAL_CAPSULE | ORAL | Status: DC | PRN
  Administered 2013-02-12: 25 mg via ORAL
  Filled 2013-02-11 (×2): qty 1

## 2013-02-11 MED ORDER — MORPHINE SULFATE (PF) 0.5 MG/ML IJ SOLN
INTRAMUSCULAR | Status: DC | PRN
  Administered 2013-02-11: .15 mg via EPIDURAL

## 2013-02-11 MED ORDER — OXYTOCIN 10 UNIT/ML IJ SOLN
40.0000 [IU] | INTRAVENOUS | Status: DC | PRN
  Administered 2013-02-11: 40 [IU] via INTRAVENOUS

## 2013-02-11 MED ORDER — SIMETHICONE 80 MG PO CHEW
80.0000 mg | CHEWABLE_TABLET | ORAL | Status: DC
  Administered 2013-02-13 – 2013-02-14 (×2): 80 mg via ORAL
  Filled 2013-02-11 (×3): qty 1

## 2013-02-11 MED ORDER — KETOROLAC TROMETHAMINE 60 MG/2ML IM SOLN
60.0000 mg | Freq: Once | INTRAMUSCULAR | Status: AC | PRN
  Administered 2013-02-11: 60 mg via INTRAMUSCULAR

## 2013-02-11 MED ORDER — FLUCONAZOLE 200 MG PO TABS
200.0000 mg | ORAL_TABLET | Freq: Every day | ORAL | Status: DC
  Administered 2013-02-11 – 2013-02-14 (×4): 200 mg via ORAL
  Filled 2013-02-11 (×4): qty 1

## 2013-02-11 MED ORDER — LANOLIN HYDROUS EX OINT: 1.0000 "application " | TOPICAL_OINTMENT | CUTANEOUS | Status: DC | PRN

## 2013-02-11 MED ORDER — SCOPOLAMINE 1 MG/3DAYS TD PT72
MEDICATED_PATCH | TRANSDERMAL | Status: AC
  Administered 2013-02-11: 1.5 mg via TRANSDERMAL
  Filled 2013-02-11: qty 1

## 2013-02-11 MED ORDER — ONDANSETRON HCL 4 MG PO TABS: 4.0000 mg | ORAL_TABLET | ORAL | Status: DC | PRN

## 2013-02-11 MED ORDER — LACTATED RINGERS IV SOLN
INTRAVENOUS | Status: DC
  Administered 2013-02-12: 02:00:00 via INTRAVENOUS

## 2013-02-11 MED ORDER — METHYLERGONOVINE MALEATE 0.2 MG/ML IJ SOLN: 0.2000 mg | INTRAMUSCULAR | Status: DC | PRN

## 2013-02-11 MED ORDER — BUPIVACAINE HCL (PF) 0.25 % IJ SOLN
INTRAMUSCULAR | Status: DC | PRN
  Administered 2013-02-11: 20 mL

## 2013-02-11 MED ORDER — PHENYLEPHRINE HCL 10 MG/ML IJ SOLN
20.0000 mg | INTRAVENOUS | Status: DC | PRN
  Administered 2013-02-11 (×2): 60 ug/min via INTRAVENOUS

## 2013-02-11 MED ORDER — IBUPROFEN 600 MG PO TABS
600.0000 mg | ORAL_TABLET | Freq: Four times a day (QID) | ORAL | Status: DC
  Administered 2013-02-11 – 2013-02-12 (×3): 600 mg via ORAL
  Filled 2013-02-11 (×3): qty 1

## 2013-02-11 MED ORDER — NALBUPHINE HCL 10 MG/ML IJ SOLN
5.0000 mg | INTRAMUSCULAR | Status: DC | PRN
  Filled 2013-02-11 (×2): qty 1

## 2013-02-11 MED ORDER — PHENYLEPHRINE 8 MG IN D5W 100 ML (0.08MG/ML) PREMIX OPTIME: INJECTION | INTRAVENOUS | Status: DC | PRN

## 2013-02-11 MED ORDER — DIPHENHYDRAMINE HCL 50 MG/ML IJ SOLN: 12.5000 mg | INTRAMUSCULAR | Status: DC | PRN

## 2013-02-11 MED ORDER — WITCH HAZEL-GLYCERIN EX PADS: 1.0000 "application " | MEDICATED_PAD | CUTANEOUS | Status: DC | PRN

## 2013-02-11 MED ORDER — METFORMIN HCL 500 MG PO TABS: 500.0000 mg | ORAL_TABLET | Freq: Two times a day (BID) | ORAL | Status: DC

## 2013-02-11 MED ORDER — HYDROMORPHONE HCL PF 1 MG/ML IJ SOLN: 0.2500 mg | INTRAMUSCULAR | Status: DC | PRN

## 2013-02-11 MED ORDER — SENNOSIDES-DOCUSATE SODIUM 8.6-50 MG PO TABS
2.0000 | ORAL_TABLET | ORAL | Status: DC
  Administered 2013-02-11 – 2013-02-14 (×3): 2 via ORAL
  Filled 2013-02-11 (×4): qty 2

## 2013-02-11 MED ORDER — TETANUS-DIPHTH-ACELL PERTUSSIS 5-2.5-18.5 LF-MCG/0.5 IM SUSP: 0.5000 mL | Freq: Once | INTRAMUSCULAR | Status: DC

## 2013-02-11 MED ORDER — LACTATED RINGERS IV SOLN
INTRAVENOUS | Status: DC
  Administered 2013-02-11: 08:00:00 via INTRAVENOUS
  Administered 2013-02-11: 50 mL/h via INTRAVENOUS
  Administered 2013-02-11: 08:00:00 via INTRAVENOUS

## 2013-02-11 MED ORDER — KETOROLAC TROMETHAMINE 30 MG/ML IJ SOLN: 30.0000 mg | Freq: Four times a day (QID) | INTRAMUSCULAR | Status: DC | PRN

## 2013-02-11 MED ORDER — NALBUPHINE HCL 10 MG/ML IJ SOLN
5.0000 mg | INTRAMUSCULAR | Status: DC | PRN
  Administered 2013-02-11 (×2): 5 mg via INTRAVENOUS
  Filled 2013-02-11: qty 1

## 2013-02-11 MED ORDER — ZOLPIDEM TARTRATE 5 MG PO TABS: 5.0000 mg | ORAL_TABLET | Freq: Every evening | ORAL | Status: DC | PRN

## 2013-02-11 MED ORDER — BUPIVACAINE HCL (PF) 0.25 % IJ SOLN
INTRAMUSCULAR | Status: AC
  Filled 2013-02-11: qty 30

## 2013-02-11 MED ORDER — SIMETHICONE 80 MG PO CHEW
80.0000 mg | CHEWABLE_TABLET | Freq: Three times a day (TID) | ORAL | Status: DC
  Administered 2013-02-11 – 2013-02-14 (×8): 80 mg via ORAL
  Filled 2013-02-11 (×7): qty 1

## 2013-02-11 MED ORDER — ONDANSETRON HCL 4 MG/2ML IJ SOLN: 4.0000 mg | INTRAMUSCULAR | Status: DC | PRN

## 2013-02-11 MED ORDER — DIPHENHYDRAMINE HCL 25 MG PO CAPS: 25.0000 mg | ORAL_CAPSULE | Freq: Four times a day (QID) | ORAL | Status: DC | PRN

## 2013-02-11 MED ORDER — MENTHOL 3 MG MT LOZG: 1.0000 | LOZENGE | OROMUCOSAL | Status: DC | PRN

## 2013-02-11 MED ORDER — KETOROLAC TROMETHAMINE 60 MG/2ML IM SOLN
INTRAMUSCULAR | Status: AC
  Filled 2013-02-11: qty 2

## 2013-02-11 MED ORDER — PROMETHAZINE HCL 25 MG/ML IJ SOLN: 6.2500 mg | INTRAMUSCULAR | Status: DC | PRN

## 2013-02-11 MED ORDER — ENOXAPARIN SODIUM 40 MG/0.4ML ~~LOC~~ SOLN
40.0000 mg | Freq: Every day | SUBCUTANEOUS | Status: DC
  Administered 2013-02-12 – 2013-02-14 (×3): 40 mg via SUBCUTANEOUS
  Filled 2013-02-11 (×3): qty 0.4

## 2013-02-11 MED ORDER — PRENATAL MULTIVITAMIN CH
1.0000 | ORAL_TABLET | Freq: Every day | ORAL | Status: DC
  Administered 2013-02-12 – 2013-02-13 (×2): 1 via ORAL
  Filled 2013-02-11 (×2): qty 1

## 2013-02-11 MED ORDER — KETOROLAC TROMETHAMINE 30 MG/ML IJ SOLN: 15.0000 mg | Freq: Once | INTRAMUSCULAR | Status: AC | PRN

## 2013-02-11 MED ORDER — OXYTOCIN 10 UNIT/ML IJ SOLN
INTRAMUSCULAR | Status: AC
  Filled 2013-02-11: qty 4

## 2013-02-11 MED ORDER — DIPHENHYDRAMINE HCL 50 MG/ML IJ SOLN: 25.0000 mg | INTRAMUSCULAR | Status: DC | PRN

## 2013-02-11 MED ORDER — DIBUCAINE 1 % RE OINT: 1.0000 "application " | TOPICAL_OINTMENT | RECTAL | Status: DC | PRN

## 2013-02-11 MED ORDER — METOCLOPRAMIDE HCL 5 MG/ML IJ SOLN: 10.0000 mg | Freq: Three times a day (TID) | INTRAMUSCULAR | Status: DC | PRN

## 2013-02-11 MED ORDER — SCOPOLAMINE 1 MG/3DAYS TD PT72
1.0000 | MEDICATED_PATCH | Freq: Once | TRANSDERMAL | Status: DC
  Administered 2013-02-11: 1.5 mg via TRANSDERMAL

## 2013-02-11 MED ORDER — MORPHINE SULFATE 0.5 MG/ML IJ SOLN
INTRAMUSCULAR | Status: AC
  Filled 2013-02-11: qty 10

## 2013-02-11 MED ORDER — METFORMIN HCL 500 MG PO TABS
1000.0000 mg | ORAL_TABLET | Freq: Every day | ORAL | Status: DC
  Administered 2013-02-11 – 2013-02-12 (×2): 1000 mg via ORAL
  Filled 2013-02-11 (×3): qty 2

## 2013-02-11 MED ORDER — FENTANYL CITRATE 0.05 MG/ML IJ SOLN
INTRAMUSCULAR | Status: AC
  Filled 2013-02-11: qty 2

## 2013-02-11 MED ORDER — OXYTOCIN 40 UNITS IN LACTATED RINGERS INFUSION - SIMPLE MED: 62.5000 mL/h | INTRAVENOUS | Status: AC

## 2013-02-11 MED ORDER — METHYLERGONOVINE MALEATE 0.2 MG PO TABS: 0.2000 mg | ORAL_TABLET | ORAL | Status: DC | PRN

## 2013-02-11 MED ORDER — METFORMIN HCL 500 MG PO TABS
500.0000 mg | ORAL_TABLET | Freq: Every day | ORAL | Status: DC
  Administered 2013-02-12 – 2013-02-14 (×3): 500 mg via ORAL
  Filled 2013-02-11 (×3): qty 1

## 2013-02-11 MED ORDER — SIMETHICONE 80 MG PO CHEW
80.0000 mg | CHEWABLE_TABLET | ORAL | Status: DC | PRN
  Filled 2013-02-11: qty 1

## 2013-02-11 MED ORDER — OXYCODONE-ACETAMINOPHEN 5-325 MG PO TABS
1.0000 | ORAL_TABLET | ORAL | Status: DC | PRN
  Administered 2013-02-12 – 2013-02-14 (×9): 2 via ORAL
  Filled 2013-02-11 (×9): qty 2

## 2013-02-11 MED ORDER — NALOXONE HCL 1 MG/ML IJ SOLN
1.0000 ug/kg/h | INTRAVENOUS | Status: DC | PRN
  Filled 2013-02-11: qty 2

## 2013-02-11 SURGICAL SUPPLY — 34 items
CLAMP CORD UMBIL (MISCELLANEOUS) ×2 IMPLANT
CLOTH BEACON ORANGE TIMEOUT ST (SAFETY) ×2 IMPLANT
CONTAINER PREFILL 10% NBF 15ML (MISCELLANEOUS) IMPLANT
DRAPE LG THREE QUARTER DISP (DRAPES) ×4 IMPLANT
DRESSING TELFA 8X3 (GAUZE/BANDAGES/DRESSINGS) ×2 IMPLANT
DRSG OPSITE POSTOP 4X10 (GAUZE/BANDAGES/DRESSINGS) ×2 IMPLANT
DURAPREP 26ML APPLICATOR (WOUND CARE) ×2 IMPLANT
ELECT REM PT RETURN 9FT ADLT (ELECTROSURGICAL) ×2
ELECTRODE REM PT RTRN 9FT ADLT (ELECTROSURGICAL) ×1 IMPLANT
EXTRACTOR VACUUM M CUP 4 TUBE (SUCTIONS) IMPLANT
GAUZE SPONGE 4X4 12PLY STRL LF (GAUZE/BANDAGES/DRESSINGS) ×2 IMPLANT
GLOVE BIO SURGEON STRL SZ7.5 (GLOVE) ×2 IMPLANT
GOWN PREVENTION PLUS XLARGE (GOWN DISPOSABLE) ×4 IMPLANT
GOWN STRL NON-REIN LRG LVL3 (GOWN DISPOSABLE) ×2 IMPLANT
GOWN STRL REIN XL XLG (GOWN DISPOSABLE) ×2 IMPLANT
KIT ABG SYR 3ML LUER SLIP (SYRINGE) IMPLANT
NEEDLE HYPO 25X1 1.5 SAFETY (NEEDLE) ×2 IMPLANT
NEEDLE HYPO 25X5/8 SAFETYGLIDE (NEEDLE) IMPLANT
NS IRRIG 1000ML POUR BTL (IV SOLUTION) ×2 IMPLANT
PACK C SECTION WH (CUSTOM PROCEDURE TRAY) ×2 IMPLANT
PAD ABD 7.5X8 STRL (GAUZE/BANDAGES/DRESSINGS) ×2 IMPLANT
PAD OB MATERNITY 4.3X12.25 (PERSONAL CARE ITEMS) ×2 IMPLANT
STAPLER VISISTAT 35W (STAPLE) ×2 IMPLANT
SUT MNCRL 0 VIOLET CTX 36 (SUTURE) ×4 IMPLANT
SUT MON AB 2-0 CT1 27 (SUTURE) ×2 IMPLANT
SUT MON AB-0 CT1 36 (SUTURE) ×6 IMPLANT
SUT MONOCRYL 0 CTX 36 (SUTURE) ×4
SUT PLAIN 0 NONE (SUTURE) IMPLANT
SUT PLAIN 2 0 XLH (SUTURE) ×2 IMPLANT
SYR CONTROL 10ML LL (SYRINGE) ×2 IMPLANT
TAPE CLOTH SURG 4X10 WHT LF (GAUZE/BANDAGES/DRESSINGS) ×2 IMPLANT
TOWEL OR 17X24 6PK STRL BLUE (TOWEL DISPOSABLE) ×2 IMPLANT
TRAY FOLEY CATH 14FR (SET/KITS/TRAYS/PACK) ×2 IMPLANT
WATER STERILE IRR 1000ML POUR (IV SOLUTION) ×2 IMPLANT

## 2013-02-11 NOTE — Transfer of Care (Signed)
Immediate Anesthesia Transfer of Care Note  Patient: Kathleen Walsh  Procedure(s) Performed: Procedure(s) with comments: Repeat CESAREAN SECTION WITH BILATERAL TUBAL LIGATION (Bilateral) - EDD: 03/01/13  Patient Location: PACU  Anesthesia Type:Spinal  Level of Consciousness: awake, alert  and oriented  Airway & Oxygen Therapy: Patient Spontanous Breathing  Post-op Assessment: Report given to PACU RN and Post -op Vital signs reviewed and stable  Post vital signs: Reviewed and stable  Complications: No apparent anesthesia complications

## 2013-02-11 NOTE — Preoperative (Signed)
Beta Blockers   Reason not to administer Beta Blockers:Not Applicable 

## 2013-02-11 NOTE — Lactation Note (Signed)
This note was copied from the chart of Kathleen Walsh. Lactation Consultation Note     Initial consult with this mom and baby, in PACU, 1 hour old, with borderline one touch (37). He was too sleepy to suckle, so I had expressed about 0.1 ml of colostrum, and he eagerly fed from the spoon  I was able to get him latched in football hold and  he would not suck, but I was able to hand express a few large drops of colostrum into his mouth.  Mom being transferred to her room, and she will be followed by lactation there. Patient Name: Kathleen Lawsyn Heiler [id number] Date: 02/11/2013 Reason for consult: Initial assessment   Maternal Data Formula Feeding for Exclusion: No Infant to breast within first hour of birth: Yes Has patient been taught Hand Expression?: Yes Does the patient have breastfeeding experience prior to this delivery?: Yes  Feeding Feeding Type: Breast Fed Length of feed: 1 min  LATCH Score/Interventions Latch: Repeated attempts needed to sustain latch, nipple held in mouth throughout feeding, stimulation needed to elicit sucking reflex. (very sleepy. low blood sugar) Intervention(s): Adjust position;Assist with latch;Breast massage;Breast compression  Audible Swallowing: None Intervention(s): Skin to skin;Hand expression  Type of Nipple: Everted at rest and after stimulation  Comfort (Breast/Nipple): Soft / non-tender     Hold (Positioning): Assistance needed to correctly position infant at breast and maintain latch. Intervention(s): Breastfeeding basics reviewed;Position options;Skin to skin  LATCH Score: 6  Lactation Tools Discussed/Used     Consult Status Consult Status: Follow-up Date: 02/11/13 Follow-up type: In-patient    Alfred Levins 02/11/2013, 9:47 AM

## 2013-02-11 NOTE — Anesthesia Procedure Notes (Signed)
Epidural Patient location during procedure: OB  Preanesthetic Checklist Completed: patient identified, site marked, surgical consent, pre-op evaluation, timeout performed, IV checked, risks and benefits discussed and monitors and equipment checked  Epidural Patient position: sitting Prep: site prepped and draped and DuraPrep Patient monitoring: continuous pulse ox and blood pressure Approach: midline Injection technique: LOR air  Needle:  Needle type: Tuohy  Needle gauge: 17 G Needle length: 9 cm and 9 Catheter at skin depth: 10 cm Test dose: negative  Assessment Sensory level: T4 Events: blood not aspirated, injection not painful, no injection resistance, negative IV test and no paresthesia  Additional Notes Sprotte thru  Tuohy; no parathesia Spinal Dosage in OR  Bupivicaine ml       1.6 PFMS04   mcg        150

## 2013-02-11 NOTE — Anesthesia Postprocedure Evaluation (Signed)
  Anesthesia Post-op Note  Patient: Kathleen Walsh  Procedure(s) Performed: Procedure(s) with comments: Repeat CESAREAN SECTION WITH BILATERAL TUBAL LIGATION (Bilateral) - EDD: 03/01/13  Patient is awake, responsive, moving her legs, and has signs of resolution of her numbness. Pain and nausea are reasonably well controlled. Vital signs are stable and clinically acceptable. Oxygen saturation is clinically acceptable. There are no apparent anesthetic complications at this time. Patient is ready for discharge.

## 2013-02-11 NOTE — Anesthesia Preprocedure Evaluation (Signed)
Anesthesia Evaluation  Patient identified by MRN, date of birth, ID band Patient awake    Reviewed: Allergy & Precautions, H&P , NPO status , Patient's Chart, lab work & pertinent test results  Airway Mallampati: III TM Distance: >3 FB Neck ROM: full    Dental no notable dental hx.    Pulmonary    Pulmonary exam normal       Cardiovascular negative cardio ROS      Neuro/Psych    GI/Hepatic negative GI ROS, Neg liver ROS,   Endo/Other  diabetes, GestationalMorbid obesity  Renal/GU negative Renal ROS     Musculoskeletal negative musculoskeletal ROS (+)   Abdominal (+) + obese,   Peds  Hematology negative hematology ROS (+)   Anesthesia Other Findings   Reproductive/Obstetrics (+) Pregnancy                           Anesthesia Physical Anesthesia Plan  ASA: III  Anesthesia Plan: Spinal   Post-op Pain Management:    Induction:   Airway Management Planned:   Additional Equipment:   Intra-op Plan:   Post-operative Plan:   Informed Consent: I have reviewed the patients History and Physical, chart, labs and discussed the procedure including the risks, benefits and alternatives for the proposed anesthesia with the patient or authorized representative who has indicated his/her understanding and acceptance.     Plan Discussed with: CRNA and Surgeon  Anesthesia Plan Comments:         Anesthesia Quick Evaluation

## 2013-02-11 NOTE — Op Note (Signed)
Cesarean Section Procedure Note  Indications: previous uterine incision kerr x one Antiphospholipid Ab syndrome Class A2 DM [place]  Pre-operative Diagnosis: 37 week 4 day pregnancy.  Post-operative Diagnosis: same  Surgeon: Lenoard Aden   Assistants: Fredric Mare, CNM  Anesthesia: Local anesthesia 0.25.% bupivacaine and Spinal anesthesia  ASA Class: 2  Procedure Details  The patient was seen in the Holding Room. The risks, benefits, complications, treatment options, and expected outcomes were discussed with the patient.  The patient concurred with the proposed plan, giving informed consent. The risks of anesthesia, infection, bleeding and possible injury to other organs discussed. Injury to bowel, bladder, or ureter with possible need for repair discussed. Possible need for transfusion with secondary risks of hepatitis or HIV acquisition discussed. Post operative complications to include but not limited to DVT, PE and Pneumonia noted. The site of surgery properly noted/marked. The patient was taken to Operating Room # 9, identified as [name] and the procedure verified as C-Section Delivery. A Time Out was held and the above information confirmed.  After induction of anesthesia, the patient was draped and prepped in the usual sterile manner. A Pfannenstiel incision was made and carried down through the subcutaneous tissue to the fascia. Fascial incision was made and extended transversely using Mayo scissors. The fascia was separated from the underlying rectus tissue superiorly and inferiorly. The peritoneum was identified and entered. Peritoneal incision was extended longitudinally. The utero-vesical peritoneal reflection was incised transversely and the bladder flap was bluntly freed from the lower uterine segment. A low transverse uterine incision(Kerr hysterotomy) was made. Delivered from OT presentation was a  female with Apgar scores of 9 at one minute and 9 at five minutes. Bulb  suctioning gently performed. Neonatal team in attendance.After the umbilical cord was clamped and cut cord blood was obtained for evaluation. The placenta was removed intact and appeared normal. The uterus was curetted with a dry lap pack. Good hemostasis was noted.The uterine outline, tubes and ovaries appeared normal. The uterine incision was closed with running locked sutures of 0 Monocryl x 2 layers. Hemostasis was observed. Modified [place] tubal ligation bilaterally. Lavage was carried out until clear.The parietal peritoneum was closed with a running 2-0 Monocryl suture. The fascia was then reapproximated with running sutures of 0 Monocryl. 2-0 plain on [place] layer. The skin was reapproximated with staples.  Instrument, sponge, and needle counts were correct prior the abdominal closure and at the conclusion of the case.   Findings: FTLM, 9/9, post placenta  Estimated Blood Loss:  400         Drains: foley                 Specimens: bilateral tubal segments                 Complications:  None; patient tolerated the procedure well.         Disposition: PACU - hemodynamically stable.         Condition: stable  Attending Attestation: I performed the procedure.

## 2013-02-11 NOTE — Progress Notes (Signed)
Patient ID: Kathleen Walsh, female   DOB: [birth year], 32 y.o.   MRN: [id number] Patient seen and examined. Consent witnessed and signed. No changes noted. Update completed.

## 2013-02-12 ENCOUNTER — Encounter (HOSPITAL_COMMUNITY): Payer: Self-pay | Admitting: Obstetrics and Gynecology

## 2013-02-12 LAB — GLUCOSE, CAPILLARY
Glucose-Capillary: 90 mg/dL (ref 70–99)
Glucose-Capillary: 99 mg/dL (ref 70–99)
Glucose-Capillary: 86 mg/dL (ref 70–99)

## 2013-02-12 LAB — CBC
Platelets: 184 10*3/uL (ref 150–400)
RBC: 4.11 MIL/uL (ref 3.87–5.11)
RDW: 16.1 % — ABNORMAL HIGH (ref 11.5–15.5)
WBC: 9.6 10*3/uL (ref 4.0–10.5)

## 2013-02-12 LAB — BIRTH TISSUE RECOVERY COLLECTION (PLACENTA DONATION)

## 2013-02-12 NOTE — Lactation Note (Signed)
This note was copied from the chart of [name]. Lactation Consultation Note  Patient Name: [name] ZOXWR'U Date: 02/12/2013 Reason for consult: Follow-up assessment Mom needs lots of reassurance with breastfeeding. She was not successful with her 1st child, reports being separated from her after birth and then on diuretics due to swelling she did not sustain her milk supply. The baby was sleepy at this visit, he had his circumcision this morning, but did awaken to breastfeed after a diaper change. Assisted Mom with positioning on the left breast, demonstrated how to sandwich her nipple to help with latch. Baby would fall asleep at the breast requiring re-latch but he did demonstrate a good suckling pattern when active and Mom has lots of colostrum from the left breast with hand expression. Mom has pumped a few times reports finger feeding the baby small amounts of colostrum. Advised Mom to expect baby to become more active at the breast this evening or tonight. Mom could post pump during the day or evening till baby becomes more awake to breastfeed to encourage her milk production. If baby is cluster feeding advised to BF whenever the baby is hungry but at least every 2-3 hours keeping baby active for 15-30 minutes each feeding. Advised Mom to ask for assist as needed. Some basic teaching reviewed.   Maternal Data    Feeding Feeding Type: Breast Fed Length of feed: 13 min  LATCH Score/Interventions Latch: Repeated attempts needed to sustain latch, nipple held in mouth throughout feeding, stimulation needed to elicit sucking reflex. Intervention(s): Adjust position;Assist with latch;Breast massage;Breast compression  Audible Swallowing: A few with stimulation  Type of Nipple: Everted at rest and after stimulation  Comfort (Breast/Nipple): Soft / non-tender     Hold (Positioning): Assistance needed to correctly position infant at breast and maintain latch. Intervention(s):  Breastfeeding basics reviewed;Support Pillows;Position options;Skin to skin  LATCH Score: 7  Lactation Tools Discussed/Used Tools: Pump Breast pump type: Double-Electric Breast Pump   Consult Status Consult Status: Follow-up Date: 02/13/13 Follow-up type: In-patient    Kathleen Walsh 02/12/2013, 1:03 PM

## 2013-02-12 NOTE — Progress Notes (Signed)
Kathleen Walsh NMW called and I related call to diabetic coordinator to help advise pt on amount of metformin to take after discharge. Also relayed her suggestion to change up times of cbg's see other note. Kathleen decided to wait until tomorrow and not change times of cbg's at this time. Pt made aware of calls and is eager to see diabetic coordinator for advice after discharge.

## 2013-02-12 NOTE — Progress Notes (Signed)
CSW received consult for hx of depression.  CSW reviewed MOB's PNR which notes hx of "mild" depression.  CSW contacted RN to see if there have been any concerns while MOB has been in the hospital.  RN states MOB has been completely appropriate, showing no signs of depression or emotional distress and has great family support.  CSW is screening out referral at this time and asks that RN review signs and symptoms of PPD with MOB prior to discharge and contact CSW if emotional concerns arise.  RN agreed. 

## 2013-02-12 NOTE — Progress Notes (Signed)
Diabetic coordinator called re: Pt metformin requirements after delivery as pt states Dr. Billy Coast still has her taking 1500mg  of metformin daily which she states was the same as her pregnancy dose. Beryl Meager called back and will visit pt. In am. No hypoglycemic episodes have occurred thus far and ptis ordering and encouraged to eat  pm snack. Diabetic coordinator suggested I call MD and ask if the 2hr Post prandial cbg's with am fasting cbg can be switched to [facility] cbg's and HS cbg.

## 2013-02-12 NOTE — Progress Notes (Signed)
Inpatient Diabetes Program Recommendations  AACE/ADA: New Consensus Statement on Inpatient Glycemic Control (2013)  Target Ranges:  Prepandial:   less than 140 mg/dL      Peak postprandial:   less than 180 mg/dL (1-2 hours)      Critically ill patients:  140 - 180 mg/dL     **Received call from Verl Dicker, RN on postpartum unit.  RN had concerns that patient may be on too much Metformin postpartum.  Per RN, patient was diagnosed with DM prior to pregnancy and was started on Metformin 500 mg daily before getting pregnant.  Per RN, Dr. Billy Coast has continued patient on Metformin 500 mg in the AM and 1000 mg in the PM after delivery.  **Upon chart review, note that patient has PCP listed as Dr. Burnell Blanks with [facility].  Patient will need to follow up with Dr. Nathanial Rancher after d/c for further monitoring of her DM.  **Note patient weight is ~330 pounds (150 kg).  Current dose of Metformin is likely safe, however, should patient experience hypoglycemia (which is possible as patient's insulin resistance is much decreased now that she has delivered and she is also breastfeeding), we could decrease her Metformin dose to 500 mg daily (pre-pregnancy dose). Unsure of what A1c level was prior to pregnancy.  **Have asked RN to please call Dr. Billy Coast to get patient's CBG schedule changed to tid ac + HS (current CBG schedule is fasting and 2 hours postprandial).  **Will check on patient in AM and assist as needed.  Ambrose Finland RN, MSN, CDE Diabetes Coordinator Inpatient Diabetes Program Team Pager: [phone] (8a-10p)

## 2013-02-12 NOTE — Progress Notes (Signed)
POSTOPERATIVE DAY # 1 S/P CS with BTL   S:         Reports feeling tired and really crampy             Tolerating po intake / no nausea / no vomiting / + flatus / no BM             Bleeding is light             Pain controlled with long-acting narcotic and motrin             Up ad lib / ambulatory/ voiding QS  Newborn breast feeding  / Circumcision planned today  O:  VS: BP 123/81  Pulse 83  Temp(Src) 98.4 F (36.9 C) (Oral)  Resp 20  Wt 149.687 kg (330 lb)  BMI 53.29 kg/m2  SpO2 95%  LMP 05/26/2011   LABS:               Recent Labs  02/12/13 0600  WBC 9.6  HGB 10.1*  PLT 184               Bloodtype: --/--/A POS, A POS (10/10 1200)  Rubella: Immune (04/10 1452)                                             I&O: Intake/Output     10/13 0701 - 10/14 0700 10/14 0701 - 10/15 0700   I.V. (mL/kg) 3153 (21.1)    Total Intake(mL/kg) 3153 (21.1)    Urine (mL/kg/hr) 2175 (0.6)    Blood 600 (0.2)    Total Output 2775     Net +378                       Physical Exam:             Alert and Oriented X3 / morbidly obese  Lungs: Clear and unlabored  Heart: regular rate and rhythm / no mumurs  Abdomen: soft, non-tender, non-distended, hypoactive bowel sounds / pendulous panus - soft / no edema             Fundus: firm, non-tender, Ueven             Dressing intact pressure       Lochia: light  Extremities: 1+edema, no calf pain or tenderness, negative Homans - SCD of this AM  A:        POD # 1 S/P CS and BTL            Obesity and Antiphospholipid syndrome            GDMA2            Cutaneous candidiasis - dx at time of CS        P:        Routine postoperative care              Lovenox restart today & continue daily / no NSAIDS / ambulate             Metformin  / monitor CBG and continue low carb diet             Fluconazole daily x 10-14 days / remove dressing today & keep rash area open to air    Marlinda Mike CNM, MSN, [facility] 02/12/2013, 7:27 AM

## 2013-02-13 LAB — GLUCOSE, CAPILLARY
Glucose-Capillary: 100 mg/dL — ABNORMAL HIGH (ref 70–99)
Glucose-Capillary: 101 mg/dL — ABNORMAL HIGH (ref 70–99)
Glucose-Capillary: 88 mg/dL (ref 70–99)
Glucose-Capillary: 90 mg/dL (ref 70–99)

## 2013-02-13 NOTE — Progress Notes (Signed)
POD # 2  Subjective: Pt reports feeling fair, has HA/ Pain controlled with Percocet Tolerating po/Voiding without problems/ No n/v/ Flatus present No visual changes or epigastric pain Activity: ad lib Bleeding is light Newborn info:  Information for the patient's newborn:  Wendee, Hata [[id number]]  female  / Circumcision: done/ Feeding: breast   Objective: VS:  Filed Vitals:   02/12/13 0115 02/12/13 0515 02/12/13 1827 02/13/13 0500  BP: 114/72 123/81 142/77 124/76  Pulse: 83 83 88 89  Temp: 98.8 F (37.1 C) 98.4 F (36.9 C) 98.7 F (37.1 C) 98.3 F (36.8 C)  TempSrc: Oral Oral Oral Oral  Resp: 20 20 20 20   Weight:      SpO2: 97% 95%      I&O: Intake/Output     10/14 0701 - 10/15 0700 10/15 0701 - 10/16 0700   I.V. (mL/kg)     Total Intake(mL/kg)     Urine (mL/kg/hr)     Blood     Total Output       Net              LABS:  Recent Labs  02/12/13 0600  WBC 9.6  HGB 10.1*  HCT 31.7*  PLT 184    Blood type: --/--/A POS, A POS (10/10 1200) Rubella: Immune (04/10 1452)     Physical Exam:  General: alert and cooperative CV: Regular rate and rhythm Resp: CTA bilaterally Abdomen: soft, nontender, normal bowel sounds, pendulous Uterine Fundus: firm, below umbilicus, nontender Incision: well approximated with staples, c/d/i Lochia: minimal Ext: extremities normal, atraumatic, no cyanosis or edema and Homans sign is negative, no sign of DVT    Assessment/: POD # 2/ [id number]/ S/P C/Section d/t repeat A2DM, delivered-BG stable Cutaneous candidiasis Morbid obesity Antiphospholipid antibody syndrome Headache Doing well  Plan: Continue routine post op orders Continue Metformin Continue Diflucan Continue Lovenox-no NSAIDS If HA persists, Anesthesia consult Anticipate discharge home in the am   Signed: Donette Larry, N, MSN, CNM 02/13/2013, 11:48 AM

## 2013-02-13 NOTE — Progress Notes (Signed)
Inpatient Diabetes Program Recommendations  AACE/ADA: New Consensus Statement on Inpatient Glycemic Control (2013)  Target Ranges:  Prepandial:   less than 140 mg/dL      Peak postprandial:   less than 180 mg/dL (1-2 hours)      Critically ill patients:  140 - 180 mg/dL     **Spoke with patient via the telephone today.  Patient told me Dr. Billy Coast started her on Metformin prior to her conceiving.  During her pregnancy, her Metformin was increased to the present dose and she was checking her CBGs four times per day at home.  Patient stated she was never told she had DM but that her A1c was elevated prior to her pregnancy.  **No A1c results on file in [facility].  Patient could not recall what her A1c result was from memory.  Still has CBG meter at home.  Patient also stated she sees Dr. Burnell Blanks with [facility] for primary care.  **Encouraged patient to follow up with Dr. Nathanial Rancher within the next 1-2 months to re-evaluate her CBGs and A1c.  Reminded patient that Dr. Nathanial Rancher will be the physician that will help manage her medical issues including her blood sugar levels.  Encouraged patient to continue to check her CBGs once a day and to keep a logbook of those CBGs for Dr. Nathanial Rancher.  Patient did not have any questions for me.  Encouraged patient to follow up with the [facility] Nutrition and DM management center for DM education if it is determined she truly has Type 2 DM.    Will follow. Ambrose Finland RN, MSN, CDE Diabetes Coordinator Inpatient Diabetes Program Team Pager: [phone] (8a-10p)

## 2013-02-14 LAB — GLUCOSE, CAPILLARY: Glucose-Capillary: 82 mg/dL (ref 70–99)

## 2013-02-14 MED ORDER — ENOXAPARIN SODIUM 40 MG/0.4ML ~~LOC~~ SOLN: 60.0000 mg | Freq: Every day | SUBCUTANEOUS | Status: DC

## 2013-02-14 MED ORDER — FLUCONAZOLE 100 MG PO TABS: 200.0000 mg | ORAL_TABLET | Freq: Every day | ORAL | Status: DC

## 2013-02-14 MED ORDER — METFORMIN HCL 500 MG PO TABS: 500.0000 mg | ORAL_TABLET | Freq: Two times a day (BID) | ORAL | Status: DC

## 2013-02-14 MED ORDER — OXYCODONE-ACETAMINOPHEN 5-325 MG PO TABS: 1.0000 | ORAL_TABLET | ORAL | Status: DC | PRN

## 2013-02-14 NOTE — Discharge Summary (Signed)
Obstetric Discharge Summary Reason for Admission: Repeat cesarean section Prenatal Procedures: ultrasound and NST Intrapartum Procedures: cesarean: low cervical, transverse Postpartum Procedures: none Complications-Operative and Postpartum: cutaneous candidiasis, Antiphospholipid Syndrome HGB  Date Value Range Status  09/02/2010 12.3  11.6 - 15.9 g/dL Final     Hemoglobin  Date Value Range Status  02/12/2013 10.1* 12.0 - 15.0 g/dL Final     HCT  Date Value Range Status  02/12/2013 31.7* 36.0 - 46.0 % Final  09/02/2010 38.0  34.8 - 46.6 % Final    Physical Exam:  General: alert, cooperative, no distress and morbidly obese Lochia: appropriate Uterine Fundus: firm, midline, U-1 Incision: healing well, edges well-approximated with staples DVT Evaluation: No evidence of DVT seen on physical exam. Negative Homan's sign. No cords or calf tenderness. Calf/Ankle 1+ edema is present.  Discharge Diagnoses: S/P C/Section d/t repeat        A2DM, delivered-BG stable               Cutaneous candidiasis        Morbid obesity        Antiphospholipid antibody syndrome    Discharge Information: Date: 02/14/2013 Activity: pelvic rest and no driving x 2 weeks Diet: Carb modified Medications: PNV, Percocet, Lovenox and Metformin Condition: stable Instructions: refer to practice specific booklet Discharge to: home - rooming in with infant until infant discharge Follow-up Information   Follow up with Lenoard Aden, MD. Schedule an appointment as soon as possible for a visit on 02/18/2013.   Specialty:  Obstetrics and Gynecology   Contact information:   [address] [place] [address] [phone]       Newborn Data: Live born female on 02/11/2013 Birth Weight: 8 lb 6.2 oz (3805 g) APGAR: 9, 9  Infant not discharged today d/t need for extended observation for jaundice  Kenard Gower, MSN, CNM 02/14/2013, 9:15 AM

## 2013-02-14 NOTE — Lactation Note (Signed)
This note was copied from the chart of Kathleen Walsh. Lactation Consultation Note   Follow up consult with this mom and baby, now 73 hours post partum. The baby has an elevated bili, and is now a baby patient for photothearpy. He is at 10% weight loss. Mom is both breast feeding and pumping, and feeding EBM. She has an increasing supply, and pumped 40 mls this morning in 10 minutes. I advised her to pump  15-30 minutes, until she stops dripping, and to pump fi the baby has not breast fed, or has has not pumped, every 3 hours, to protect her supply, and supplement the baby. The baby needed formula once during the nioght, but EBM should be sufficient for today. Mom will call when baby is ready to feed, for help with latching.  Patient Name: Kathleen Walsh ZOXWR'U Date: 02/14/2013 Reason for consult: Follow-up assessment   Maternal Data    Feeding Feeding Type: Breast Fed Length of feed: 30 min  LATCH Score/Interventions                      Lactation Tools Discussed/Used Pump Review: Setup, frequency, and cleaning   Consult Status Consult Status: Follow-up Date: 02/14/13 Follow-up type: In-patient    Alfred Levins 02/14/2013, 9:16 AM

## 2013-02-14 NOTE — Progress Notes (Signed)
Patient ID: Kathleen Walsh, female   DOB: 1980-07-10, 32 y.o.   MRN: [id number] Subjective: POD# 3 Information for the patient's newborn:  Kathleen Walsh, Kathleen Walsh [[id number]]  female  / circ done  Reports feeling well Feeding: breast and bottle Patient reports tolerating PO.  Breast symptoms: none Pain controlled with narcotic analgesics including Percocet Denies HA/SOB/C/P/N/V/dizziness. Flatus present. She reports vaginal bleeding as normal, without clots.  She is ambulating, urinating without difficult.     Objective:   VS:  Filed Vitals:   02/12/13 1827 02/13/13 0500 02/13/13 2000 02/14/13 0602  BP: 142/77 124/76 136/83 132/85  Pulse: 88 89 88 78  Temp: 98.7 F (37.1 C) 98.3 F (36.8 C) 98.1 F (36.7 C) 97.6 F (36.4 C)  TempSrc: Oral Oral Oral Oral  Resp: 20 20 20 20   Weight:      SpO2:    97%       Recent Labs  02/12/13 0600  WBC 9.6  HGB 10.1*  HCT 31.7*  PLT 184     Blood type: A POS, A POS (10/10 1200)  Rubella: Immune (04/10 1452)     Physical Exam:  General: alert, cooperative and no distress CV: Regular rate and rhythm, S1S2 present or without murmur or extra heart sounds Resp: clear Abdomen: soft, nontender, normal bowel sounds Incision: clean, dry, intact and edges well-approximated with staples Uterine Fundus: firm, 1 FB below umbilicus, nontender Lochia: minimal Ext: edema 1+, Homans sign is negative, no sign of DVT and no redness or tenderness in the calves or thighs      Assessment/Plan: 32 y.o.   POD# 3. / [id number] s/p Cesarean Delivery.  Indications: Repeat                Active Problems:   Postpartum care following cesarean delivery and tubal sterilization (10/13) A2DM, delivered-BG stable  Cutaneous candidiasis  Morbid obesity  Antiphospholipid antibody syndrome   Doing well, stable.               Carb modified diet as tolerated Ambulate Routine post-op care Continue Diflucan Change Metformin to 500 mg AM and HS Change Lovenox to  60 mg daily Discharge today  *Discussed plan with Dr. Herbie Drape, MSN, CNM 02/14/2013, 8:53 AM

## 2013-03-07 ENCOUNTER — Encounter (HOSPITAL_COMMUNITY): Payer: Self-pay | Admitting: *Deleted

## 2013-07-31 ENCOUNTER — Emergency Department (HOSPITAL_COMMUNITY)
Admission: EM | Admit: 2013-07-31 | Discharge: 2013-07-31 | Disposition: A | Discharge status: Home / Self Care | Payer: BC Managed Care – PPO | Attending: Emergency Medicine | Admitting: Emergency Medicine

## 2013-07-31 ENCOUNTER — Encounter (HOSPITAL_COMMUNITY): Payer: Self-pay | Admitting: Emergency Medicine

## 2013-07-31 ENCOUNTER — Emergency Department (HOSPITAL_COMMUNITY): Payer: BC Managed Care – PPO

## 2013-07-31 DIAGNOSIS — Z79899 Other long term (current) drug therapy: Secondary | ICD-10-CM | POA: Insufficient documentation

## 2013-07-31 DIAGNOSIS — I517 Cardiomegaly: Secondary | ICD-10-CM | POA: Insufficient documentation

## 2013-07-31 DIAGNOSIS — R079 Chest pain, unspecified: Secondary | ICD-10-CM

## 2013-07-31 DIAGNOSIS — Z87891 Personal history of nicotine dependence: Secondary | ICD-10-CM | POA: Insufficient documentation

## 2013-07-31 DIAGNOSIS — Z862 Personal history of diseases of the blood and blood-forming organs and certain disorders involving the immune mechanism: Secondary | ICD-10-CM | POA: Insufficient documentation

## 2013-07-31 DIAGNOSIS — R071 Chest pain on breathing: Secondary | ICD-10-CM | POA: Insufficient documentation

## 2013-07-31 DIAGNOSIS — J811 Chronic pulmonary edema: Secondary | ICD-10-CM | POA: Insufficient documentation

## 2013-07-31 DIAGNOSIS — F0390 Unspecified dementia without behavioral disturbance: Secondary | ICD-10-CM | POA: Insufficient documentation

## 2013-07-31 DIAGNOSIS — M129 Arthropathy, unspecified: Secondary | ICD-10-CM | POA: Insufficient documentation

## 2013-07-31 DIAGNOSIS — E119 Type 2 diabetes mellitus without complications: Secondary | ICD-10-CM | POA: Insufficient documentation

## 2013-07-31 DIAGNOSIS — R0989 Other specified symptoms and signs involving the circulatory and respiratory systems: Secondary | ICD-10-CM

## 2013-07-31 LAB — BASIC METABOLIC PANEL WITH GFR
BUN: 16 mg/dL (ref 6–23)
CO2: 24 meq/L (ref 19–32)
Calcium: 8.7 mg/dL (ref 8.4–10.5)
Chloride: 102 meq/L (ref 96–112)
Creatinine, Ser: 0.7 mg/dL (ref 0.50–1.10)
GFR calc Af Amer: >90 mL/min
GFR calc non Af Amer: >90 mL/min
Glucose, Bld: 104 mg/dL — ABNORMAL HIGH (ref 70–99)
Potassium: 3.7 meq/L (ref 3.7–5.3)
Sodium: 138 meq/L (ref 137–147)

## 2013-07-31 LAB — CBC
HCT: 36 % (ref 36.0–46.0)
Hemoglobin: 11.1 g/dL — ABNORMAL LOW (ref 12.0–15.0)
MCH: 21.6 pg — ABNORMAL LOW (ref 26.0–34.0)
MCHC: 30.8 g/dL (ref 30.0–36.0)
MCV: 69.9 fL — ABNORMAL LOW (ref 78.0–100.0)
PLATELETS: 250 10*3/uL (ref 150–400)
RBC: 5.15 MIL/uL — ABNORMAL HIGH (ref 3.87–5.11)
RDW: 16.7 % — AB (ref 11.5–15.5)
WBC: 13.3 10*3/uL — ABNORMAL HIGH (ref 4.0–10.5)

## 2013-07-31 LAB — I-STAT TROPONIN, ED
TROPONIN I, POC: 0 ng/mL (ref 0.00–0.08)
Troponin i, poc: 0 ng/mL (ref 0.00–0.08)

## 2013-07-31 LAB — D-DIMER, QUANTITATIVE (NOT AT ARMC): D-Dimer, Quant: <0.27 ug/mL-FEU (ref 0.00–0.48)

## 2013-07-31 MED ORDER — ONDANSETRON HCL 4 MG/2ML IJ SOLN
4.0000 mg | Freq: Once | INTRAMUSCULAR | Status: AC
  Administered 2013-07-31: 4 mg via INTRAVENOUS
  Filled 2013-07-31: qty 2

## 2013-07-31 MED ORDER — MORPHINE SULFATE 4 MG/ML IJ SOLN
4.0000 mg | Freq: Once | INTRAMUSCULAR | Status: AC
  Administered 2013-07-31: 4 mg via INTRAVENOUS
  Filled 2013-07-31: qty 1

## 2013-07-31 MED ORDER — HYDROCODONE-ACETAMINOPHEN 5-325 MG PO TABS: 1.0000 | ORAL_TABLET | Freq: Four times a day (QID) | ORAL | Status: DC | PRN

## 2013-07-31 NOTE — ED Notes (Signed)
Pt reports this morning at 0730 left sided CP with radiation to left arm and neck. Was sitting down when occurred. Denies diaphoresis, no n/v. Pt is a x 4. In NAD.

## 2013-07-31 NOTE — Discharge Instructions (Signed)
Your lab work today was negative and it does not appear that you are having a heart attack or a clot in your lung. Please follow up with your cardiologist as you did have some fluid back up in the vasculature of your lungs. You also have a mildly enlarged heart.   Your caregiver has diagnosed you as having chest pain that is not specific for one problem, but does not require admission.  You are at low risk for an acute heart condition or other serious illness. Chest pain comes from many different causes.  SEEK IMMEDIATE MEDICAL ATTENTION IF: You have severe chest pain, especially if the pain is crushing or pressure-like and spreads to the arms, back, neck, or jaw, or if you have sweating, nausea (feeling sick to your stomach), or shortness of breath. THIS IS AN EMERGENCY. Don't wait to see if the pain will go away. Get medical help at once. Call 911 or [phone] (operator). DO NOT drive yourself to the hospital.  Your chest pain gets worse and does not go away with rest.  You have an attack of chest pain lasting longer than usual, despite rest and treatment with the medications your caregiver has prescribed.  You wake from sleep with chest pain or shortness of breath.  You feel dizzy or faint.  You have chest pain not typical of your usual pain for which you originally saw your caregiver.  Pulmonary Edema Pulmonary edema (PE) is a condition in which fluid collects in the lungs. This makes it hard to breathe. PE may be a result of the heart not pumping very well or a result of injury.  CAUSES   Coronary artery disease causes blockages in the arteries of the heart. This deprives the heart muscle of oxygen and weakens the muscle. A heart attack is a form of coronary artery disease.  High blood pressure causes the heart muscle to work harder than usual. Over time, the heart muscle may get stiff, and it starts to work less efficiently. It may also fatigue and weaken.  Viral infection of the heart  (myocarditis) may weaken the heart muscle.  Metabolic conditions such as thyroid disease, excessive alcohol use, certain vitamin deficiencies, or diabetes may also weaken the heart muscle.  Leaky or stiff heart valves may impair normal heart function.  Lung disease may strain the heart muscle.  Excessive demands on the heart such as too much salt or fluid intake.  Failure to take prescribed medicines.  Lung injury from heat or toxins, such as poisonous gas.  Infection in the lungs or other parts of the body.  Fluid overload caused by kidney failure or medicines. SYMPTOMS   Shortness of breath at rest or with exertion.  Grunting, wheezing, or gurgling while breathing.  Feeling like you cannot get enough air.  Breaths are shallow and fast.  A lot of coughing with frothy or bloody mucus.  Skin may become cool, damp, and turn a pale or bluish color. DIAGNOSIS  Initial diagnosis may be based on your history, symptoms, and a physical examination. Additional tests for PE may include:  Electrocardiography.  Chest X-ray.  Blood tests.  Stress test.  Ultrasound evaluation of the heart (echocardiography).  Evaluation by a heart doctor (cardiologist).  Test of the heart arteries to look for blockages (angiography).  Check of blood oxygen. TREATMENT  Treatment of PE will depend on the underlying cause and will focus first on relieving the symptoms.   Extra oxygen to make breathing easier and  assist with removing mucus. This may include breathing treatments or a tube into the lungs and a breathing machine.  Medicine to help the body get rid of extra water, usually through an IV tube.  Medicine to help the heart pump better.  If poor heart function is the cause, treatment may include:  Procedures to open blocked arteries, repair damaged heart valves, or remove some of the damaged heart muscle.  A pacemaker to help the heart pump with less effort. HOME CARE INSTRUCTIONS     Your health care provider will help you determine what type of exercise program may be helpful. It is important to maintain strength and increase it if possible. Pace your activities to avoid shortness of breath or chest pain. Rest for at least 1 hour before and after meals. Cardiac rehabilitation programs are available in some locations.  Eat a heart healthy diet low in salt, saturated fat, and cholesterol. Ask for help with choices.  Make a list of every medicine, vitamin, or herbal supplement you are taking. Keep the list with you at all times. Show it to your health care provider at every visit and before starting a new medicine. Keep the list up to date.  Ask your health care provider or pharmacist to help you write a plan or schedule so that you know things about each medicine such as:  Why you are taking it.  The possible side effects.  The best time of day to take it.  Foods to take with it or avoid.  When to stop taking it.  Record your hospital or clinic weight. When you get home, compare it to your scale and record your weight. Then, weigh yourself first thing in the morning daily, and record the weights. You should weigh yourself every morning after you urinate and before you eat breakfast. Wear the same amount of clothing each time you weigh yourself. Provide your health care provider with your weight record. Daily weights are important in the early recognition of excess fluid. Tell your health care provider right away if you have gained 03 lb/1.4 kg in 1 day, 05 lb/2.3 kg in a week or as directed by your health care provider. Your medicines may need to be adjusted.  Blood pressure monitoring should be done as often as directed. You can get a home blood pressure cuff at your drugstore. Record these values and bring them with you for your clinic visits. Notify your health care provider if you become dizzy or lightheaded when standing up.  If you are currently a smoker, it is  time to quit. Nicotine makes your heart work harder and is one of the leading causes of cardiac deaths. Do not use nicotine gum or patches before talking to your doctor.  Make a follow-up appointment with your health care provider as directed.  Ask your health care provider for a copy of your latest heart tracing (ECG) and keep a copy with you at all times. SEEK IMMEDIATE MEDICAL CARE IF:   You have severe chest pain, especially if the pain is crushing or pressure-like and spreads to the arms, back, neck, or jaw. THIS IS AN EMERGENCY. Do not wait to see if the pain will go away. Call for local emergency medical help. Do not drive yourself to the hospital.  You have sweating, feel sick to your stomach (nauseous), or are experiencing shortness of breath.  Your weight increases by 03 lb/1.4 kg in 1 day or 05 lb/2.3 kg in a week.  You notice increasing shortness of breath that is unusual for you. This may happen during rest, sleep, or with activity.  You develop chest pain (angina) or pain that is unusual for you.  You notice more swelling in your hands, feet, ankles or abdomen.  You notice lasting (persistent) dizziness, blurred vision, headache, or unsteadiness.  You begin to cough up bloody mucus (sputum).  You are unable to sleep because it is hard to breathe.  You begin to feel a "jumping" or "fluttering" sensation (palpitations) in the chest that is unusual for you. MAKE SURE YOU:  Understand these instructions.  Will watch your condition  Will get help right away if you are not doing well or get worse. Document Released: 07/09/2002 Document Revised: 02/06/2013 Document Reviewed: 12/24/2012 [facility] Patient Information 2014 [place].

## 2013-07-31 NOTE — ED Notes (Signed)
Pt returned from xray. Put back on monitor.

## 2013-08-03 NOTE — ED Provider Notes (Signed)
History of Present Illness   Patient Identification Kathleen Walsh is a 33 y.o. female.  Patient information was obtained from patient. History/Exam limitations: none. Patient presented to the Emergency Department by private vehicle.  Chief Complaint  Chest Pain   The patient complains of chest pain. The discomfort is described as pleuritic, pressure-like with radiation to the left arm, left neck/jaw, left shoulder. Onset of symptoms was abrupt starting 1 hour ago, unchanged course since that time.  The patient also complains of feeling as if she cannot take a full breath.. The patient denies headache, fever, cough, dyspnea, hemoptysis, sputum production, abdominal pain, nausea, vomiting, back pain and leg swelling. Patient's cardiac risk factors are obesity (BMI >= 30 kg/m2), sedentary lifestyle and smoking/ tobacco exposure. The patient denies risk factors of hypertension, obesity (BMI >= 30 kg/m2) and smoking/ tobacco exposure.  Care prior to arrival consisted of nothing, with no relief.  Past Medical History  Diagnosis Date  . Hernia   . H/O colonoscopy 2008    polyp removed  . Diabetes   . Dementia   . Sleep apnea   . Headache(784.0)     migraines  . Arthritis     left ankle  . Blood dyscrasia     lupus anticoagulant during pregnancy   Family History  Problem Relation Age of Onset  . Diabetes Mother   . Hypertension Mother   . Diabetes Father   . Hypertension Father    No current facility-administered medications for this encounter.   Current Outpatient Prescriptions  Medication Sig Dispense Refill  . metFORMIN (GLUCOPHAGE) 500 MG tablet Take 1 tablet (500 mg total) by mouth 2 (two) times daily with a meal. 1 tablet with breakfast and 1 tablet with evening meal  60 tablet  1  . ranitidine (ZANTAC) 150 MG tablet Take 150 mg by mouth 2 (two) times daily as needed. For acid reflux      . HYDROcodone-acetaminophen (NORCO) 5-325 MG per tablet Take 1 tablet by mouth every 6  (six) hours as needed for moderate pain.  10 tablet  0   No Known Allergies History   Social History  . Marital Status: Married    Spouse Name: N/A    Number of Children: N/A  . Years of Education: N/A   Occupational History  . Not on file.   Social History Main Topics  . Smoking status: Former Smoker -- 0.30 packs/day    Types: Cigarettes    Quit date: 05/11/2012  . Smokeless tobacco: Never Used  . Alcohol Use: Yes     Comment: "once every three years"   . Drug Use: No  . Sexual Activity: Not on file   Other Topics Concern  . Not on file   Social History Narrative  . No narrative on file   Review of Systems Ten systems reviewed and are negative for acute change, except as noted in the HPI.    Physical Exam   BP 117/37  Pulse 82  Temp(Src) 98.9 F (37.2 C) (Oral)  Resp 28  Ht 5\' 6"  (1.676 m)  Wt 306 lb (138.801 kg)  BMI 49.41 kg/m2  SpO2 98%  LMP 07/12/2013 Physical Exam  Nursing note and vitals reviewed. Constitutional: She is oriented to person, place, and time. She appears well-developed and well-nourished. No distress.  HENT:  Head: Normocephalic and atraumatic.  Eyes: Conjunctivae normal and EOM are normal. Pupils are equal, round, and reactive to light. No scleral icterus.  Neck: Normal range  of motion.  Cardiovascular: Normal rate, regular rhythm and normal heart sounds.  Exam reveals no gallop and no friction rub.   No murmur heard. Pulmonary/Chest: Effort normal and breath sounds normal. No respiratory distress.  Abdominal: Soft. Bowel sounds are normal. She exhibits no distension and no mass. There is no tenderness. There is no guarding.  Neurological: She is alert and oriented to person, place, and time.  Skin: Skin is warm and dry. She is not diaphoretic.     ED Course   Studies: Results for orders placed during the hospital encounter of 07/31/13  CBC      Result Value Ref Range   WBC 13.3 (*) 4.0 - 10.5 K/uL   RBC 5.15 (*) 3.87 - 5.11  MIL/uL   Hemoglobin 11.1 (*) 12.0 - 15.0 g/dL   HCT 36.0  36.0 - 46.0 %   MCV 69.9 (*) 78.0 - 100.0 fL   MCH 21.6 (*) 26.0 - 34.0 pg   MCHC 30.8  30.0 - 36.0 g/dL   RDW 16.7 (*) 11.5 - 15.5 %   Platelets 250  150 - 400 K/uL  BASIC METABOLIC PANEL      Result Value Ref Range   Sodium 138  137 - 147 mEq/L   Potassium 3.7  3.7 - 5.3 mEq/L   Chloride 102  96 - 112 mEq/L   CO2 24  19 - 32 mEq/L   Glucose, Bld 104 (*) 70 - 99 mg/dL   BUN 16  6 - 23 mg/dL   Creatinine, Ser 0.70  0.50 - 1.10 mg/dL   Calcium 8.7  8.4 - 10.5 mg/dL   GFR calc non Af Amer >90  >90 mL/min   GFR calc Af Amer >90  >90 mL/min  D-DIMER, QUANTITATIVE      Result Value Ref Range   D-Dimer, Quant <0.27  0.00 - 0.48 ug/mL-FEU  I-STAT TROPOININ, ED      Result Value Ref Range   Troponin i, poc 0.00  0.00 - 0.08 ng/mL   Comment 3           I-STAT TROPOININ, ED      Result Value Ref Range   Troponin i, poc 0.00  0.00 - 0.08 ng/mL   Comment 3            DG CHEST 2 VIEW   Final Result:         Records Reviewed: Old medical records.    Disposition: Home Referral Cardiology   Filed Vitals:   07/31/13 1356  BP: 117/37  Pulse: 82  Temp: 98.9 F (37.2 C)  Resp: 28    Patient here with chief complaint of sudden onset left-sided chest pain that radiates to the left older and neck. She has low-grade elevation in her temperature, she states she has a history of positive lupus anticoagulant discovered during pregnancy and her chest pain is pleuritic. Given these facts I concern for pulmonary embolus. Patient is borderline tachycardic however she is not dyspneic and her oxygen saturations are above 90% on room air. Chest x-ray shows some pulmonary vascular congestion with borderline cardiomegaly. Patient is also morbidly obese  Troponin negative.  Patient with negative D-dimer. Will obtain a delta troponin.  Patient with negative troponin.     Delta trop negative. Patient is to be discharged with  recommendation to follow up with PCP in regards to today's hospital visit. Chest pain is not likely of cardiac or pulmonary etiology d/t presentation,dimer negative, VSS, no tracheal  deviation, no JVD or new murmur, RRR, breath sounds equal bilaterally, EKG without acute abnormalities, negative troponin, and negative CXR. Pt has been advised start a PPI and return to the ED is CP becomes exertional, associated with diaphoresis or nausea, radiates to left jaw/arm, worsens or becomes concerning in any way. Pt appears reliable for follow up and is agreeable to discharge.   Case has been discussed with and seen by Dr. Jeneen Rinks who agrees with the above plan to discharge.     Margarita Mail, PA-C 08/05/13 1416

## 2013-08-12 NOTE — ED Provider Notes (Signed)
Medical screening examination/treatment/procedure(s) were performed by non-physician practitioner and as supervising physician I was immediately available for consultation/collaboration.   EKG Interpretation   Date/Time:  Wednesday July 31 2013 08:56:19 EDT Ventricular Rate:  96 PR Interval:  154 QRS Duration: 90 QT Interval:  362 QTC Calculation: 457 R Axis:   76 Text Interpretation:  Normal sinus rhythm Nonspecific T wave abnormality  Abnormal ECG Confirmed by Jeneen Rinks  MD, [place] ([id number]) on 07/31/2013 12:07:28 PM        Tanna Furry, MD 08/12/13 2358

## 2013-08-15 ENCOUNTER — Encounter: Payer: Self-pay | Admitting: Cardiology

## 2013-08-15 ENCOUNTER — Ambulatory Visit (INDEPENDENT_AMBULATORY_CARE_PROVIDER_SITE_OTHER): Payer: BC Managed Care – PPO | Admitting: Cardiology

## 2013-08-15 VITALS — BP 120/78 | HR 94 | Wt 305.0 lb

## 2013-08-15 DIAGNOSIS — I517 Cardiomegaly: Secondary | ICD-10-CM

## 2013-08-15 DIAGNOSIS — J209 Acute bronchitis, unspecified: Secondary | ICD-10-CM

## 2013-08-15 DIAGNOSIS — R0781 Pleurodynia: Secondary | ICD-10-CM

## 2013-08-15 DIAGNOSIS — R079 Chest pain, unspecified: Secondary | ICD-10-CM | POA: Insufficient documentation

## 2013-08-15 DIAGNOSIS — R071 Chest pain on breathing: Secondary | ICD-10-CM

## 2013-08-15 DIAGNOSIS — R0602 Shortness of breath: Secondary | ICD-10-CM

## 2013-08-15 LAB — BRAIN NATRIURETIC PEPTIDE: Pro B Natriuretic peptide (BNP): 16 pg/mL (ref 0.0–100.0)

## 2013-08-15 NOTE — Progress Notes (Signed)
[facility] [place], [place]  [address] Phone: [phone] Fax:  [phone]  Date:  08/15/2013   ID:  Kathleen Walsh, DOB Apr 08, 1981, MRN [id number]  PCP:  Leonides Sake, MD  Cardiologist:  Fransico Him, MD     History of Present Illness: Kathleen Walsh is a 33 y.o. female with a history of OSA presents today for evaluation of chest pain.  She was recently  In the ER on 4/1 with complaints of chest pain.  She described it as pleuritic and pressure likd with radiation to the left arm/neck and jaw.  It started suddenly an hour before presenting to the ER.  She also had the sensation she could not take a deep breath.  She is a smoker with HTN and obesity.  She also has a history of lupus anticoagulant noted a time of pregnancy.  Chest xray showed vascular congestion with borderline CM.  Cardiac enzymes were negative and d-dimer was negative.  She now presents today for evaluation.  Since her ER visit she has had chest congestion, SOB and cough productive of green sputum.  She has had subjective fevers today.  She says her chest now is tender over her left breast to touch.  She has not had any further pleuritic chest pain.    Wt Readings from Last 3 Encounters:  08/15/13 305 lb (138.347 kg)  07/31/13 306 lb (138.801 kg)  02/11/13 330 lb (149.687 kg)     Past Medical History  Diagnosis Date  . Hernia   . H/O colonoscopy 2008    polyp removed  . Diabetes   . Dementia   . Sleep apnea   . Headache(784.0)     migraines  . Arthritis     left ankle  . Blood dyscrasia     lupus anticoagulant during pregnancy    Current Outpatient Prescriptions  Medication Sig Dispense Refill  . HYDROcodone-acetaminophen (NORCO) 5-325 MG per tablet Take 1 tablet by mouth every 6 (six) hours as needed for moderate pain.  10 tablet  0  . metFORMIN (GLUCOPHAGE) 500 MG tablet Take 1 tablet (500 mg total) by mouth 2 (two) times daily with a meal. 1 tablet with breakfast and 1 tablet with  evening meal  60 tablet  1  . ranitidine (ZANTAC) 150 MG tablet Take 150 mg by mouth 2 (two) times daily as needed. For acid reflux       No current facility-administered medications for this visit.    Allergies:   No Known Allergies  Social History:  The patient  reports that she quit smoking about 15 months ago. Her smoking use included Cigarettes. She smoked 0.30 packs per day. She has never used smokeless tobacco. She reports that she drinks alcohol. She reports that she does not use illicit drugs.   Family History:  The patient's family history includes Diabetes in her father and mother; Hypertension in her father and mother.   ROS:  Please see the history of present illness.      All other systems reviewed and negative.   PHYSICAL EXAM: VS:  BP 120/78  Pulse 94  Wt 305 lb (138.347 kg)  LMP 07/12/2013 Well nourished, well developed, in no acute distress HEENT: normal Neck: no JVD Cardiac:  normal S1, S2; RRR; no murmur Lungs:  Scattered wheezes and rhonchi Abd: soft, nontender, no hepatomegaly Ext: no edema Skin: warm and dry Neuro:  CNs 2-12 intact, no focal abnormalities noted  EKG:  NSR with nonspecific ST abnormality  ASSESSMENT AND PLAN:  1. Cardiomegaly on chest xray which is most likely due to epicardial fat pad - check 2D echo to assess LVF 2. Pleuritic chest pain which I suspect was the beginning of acute bronchitis given that she developed cough and chest congestion shortly after that. EKG is nonischemic.  No further ischemic workup if echo is normal 3. Acute bronchitis with subjective fever 4. SOB most likely secondary to #3 - check BNP to rule out volume overload although PE is c/w acute bronchitis with rhonchi and wheezing  Followup PRN pending results of echo and BNP  Signed, Fransico Him, MD 08/15/2013 9:20 AM

## 2013-08-15 NOTE — Patient Instructions (Addendum)
Your Physician recommends you  See you Primary MD today appt made at 2:30 pm  Your physician has requested that you have an echocardiogram. Echocardiography is a painless test that uses sound waves to create images of your heart. It provides your doctor with information about the size and shape of your heart and how well your heart's chambers and valves are working. This procedure takes approximately one hour. There are no restrictions for this procedure.  Your physician recommends that you have lab work today: BNP  Your physician recommends that you continue on your current medications as directed. Please refer to the Current Medication list given to you today.  Your physician recommends that you schedule a follow-up appointment as needed with DR. Radford Pax

## 2013-08-15 NOTE — Progress Notes (Signed)
Quick Note:  Patient notified of lab results and Dr. Theodosia Blender advisement to continue current therapy plan. Patient verbalized understanding. ______

## 2013-08-30 ENCOUNTER — Ambulatory Visit (HOSPITAL_COMMUNITY): Payer: BC Managed Care – PPO | Attending: Internal Medicine | Admitting: Radiology

## 2013-08-30 DIAGNOSIS — I10 Essential (primary) hypertension: Secondary | ICD-10-CM | POA: Insufficient documentation

## 2013-08-30 DIAGNOSIS — G4733 Obstructive sleep apnea (adult) (pediatric): Secondary | ICD-10-CM | POA: Insufficient documentation

## 2013-08-30 DIAGNOSIS — R079 Chest pain, unspecified: Secondary | ICD-10-CM | POA: Insufficient documentation

## 2013-08-30 DIAGNOSIS — F17200 Nicotine dependence, unspecified, uncomplicated: Secondary | ICD-10-CM | POA: Insufficient documentation

## 2013-08-30 DIAGNOSIS — E119 Type 2 diabetes mellitus without complications: Secondary | ICD-10-CM | POA: Insufficient documentation

## 2013-08-30 DIAGNOSIS — I517 Cardiomegaly: Secondary | ICD-10-CM

## 2013-08-30 DIAGNOSIS — Z6841 Body Mass Index (BMI) 40.0 and over, adult: Secondary | ICD-10-CM | POA: Insufficient documentation

## 2013-08-30 NOTE — Progress Notes (Signed)
Echocardiogram performed.  

## 2013-09-02 ENCOUNTER — Encounter (HOSPITAL_COMMUNITY): Payer: Self-pay | Admitting: Emergency Medicine

## 2013-09-02 ENCOUNTER — Emergency Department (HOSPITAL_COMMUNITY)
Admission: EM | Admit: 2013-09-02 | Discharge: 2013-09-02 | Disposition: A | Discharge status: Home / Self Care | Payer: BC Managed Care – PPO | Attending: Emergency Medicine | Admitting: Emergency Medicine

## 2013-09-02 DIAGNOSIS — M19079 Primary osteoarthritis, unspecified ankle and foot: Secondary | ICD-10-CM | POA: Insufficient documentation

## 2013-09-02 DIAGNOSIS — F0390 Unspecified dementia without behavioral disturbance: Secondary | ICD-10-CM | POA: Insufficient documentation

## 2013-09-02 DIAGNOSIS — R000 Tachycardia, unspecified: Secondary | ICD-10-CM | POA: Insufficient documentation

## 2013-09-02 DIAGNOSIS — Z862 Personal history of diseases of the blood and blood-forming organs and certain disorders involving the immune mechanism: Secondary | ICD-10-CM | POA: Insufficient documentation

## 2013-09-02 DIAGNOSIS — Z9889 Other specified postprocedural states: Secondary | ICD-10-CM | POA: Insufficient documentation

## 2013-09-02 DIAGNOSIS — Z8719 Personal history of other diseases of the digestive system: Secondary | ICD-10-CM | POA: Insufficient documentation

## 2013-09-02 DIAGNOSIS — Z87891 Personal history of nicotine dependence: Secondary | ICD-10-CM | POA: Insufficient documentation

## 2013-09-02 DIAGNOSIS — E860 Dehydration: Secondary | ICD-10-CM | POA: Insufficient documentation

## 2013-09-02 DIAGNOSIS — E119 Type 2 diabetes mellitus without complications: Secondary | ICD-10-CM | POA: Insufficient documentation

## 2013-09-02 DIAGNOSIS — Z8679 Personal history of other diseases of the circulatory system: Secondary | ICD-10-CM | POA: Insufficient documentation

## 2013-09-02 DIAGNOSIS — A088 Other specified intestinal infections: Secondary | ICD-10-CM | POA: Insufficient documentation

## 2013-09-02 DIAGNOSIS — A084 Viral intestinal infection, unspecified: Secondary | ICD-10-CM

## 2013-09-02 DIAGNOSIS — Z79899 Other long term (current) drug therapy: Secondary | ICD-10-CM | POA: Insufficient documentation

## 2013-09-02 LAB — CBC WITH DIFFERENTIAL/PLATELET
Basophils Absolute: 0 10*3/uL (ref 0.0–0.1)
Basophils Relative: 0 % (ref 0–1)
EOS PCT: 0 % (ref 0–5)
Eosinophils Absolute: 0 10*3/uL (ref 0.0–0.7)
HEMATOCRIT: 40.8 % (ref 36.0–46.0)
Hemoglobin: 12.6 g/dL (ref 12.0–15.0)
Lymphocytes Relative: 4 % — ABNORMAL LOW (ref 12–46)
Lymphs Abs: 0.6 10*3/uL — ABNORMAL LOW (ref 0.7–4.0)
MCH: 21.6 pg — AB (ref 26.0–34.0)
MCHC: 30.9 g/dL (ref 30.0–36.0)
MCV: 70 fL — AB (ref 78.0–100.0)
MONOS PCT: 2 % — AB (ref 3–12)
Monocytes Absolute: 0.3 10*3/uL (ref 0.1–1.0)
NEUTROS ABS: 13.1 10*3/uL — AB (ref 1.7–7.7)
Neutrophils Relative %: 94 % — ABNORMAL HIGH (ref 43–77)
PLATELETS: 271 10*3/uL (ref 150–400)
RBC: 5.83 MIL/uL — ABNORMAL HIGH (ref 3.87–5.11)
RDW: 17.8 % — ABNORMAL HIGH (ref 11.5–15.5)
WBC: 14 10*3/uL — AB (ref 4.0–10.5)

## 2013-09-02 LAB — I-STAT ARTERIAL BLOOD GAS, ED
Acid-base deficit: 4 mmol/L — ABNORMAL HIGH (ref 0.0–2.0)
Bicarbonate: 19.8 mEq/L — ABNORMAL LOW (ref 20.0–24.0)
O2 Saturation: 96 %
PH ART: 7.418 (ref 7.350–7.450)
TCO2: 21 mmol/L (ref 0–100)
pCO2 arterial: 30.7 mmHg — ABNORMAL LOW (ref 35.0–45.0)
pO2, Arterial: 82 mmHg (ref 80.0–100.0)

## 2013-09-02 LAB — COMPREHENSIVE METABOLIC PANEL
ALT: 27 U/L (ref 0–35)
AST: 14 U/L (ref 0–37)
Albumin: 3.8 g/dL (ref 3.5–5.2)
Alkaline Phosphatase: 95 U/L (ref 39–117)
BILIRUBIN TOTAL: 0.5 mg/dL (ref 0.3–1.2)
BUN: 16 mg/dL (ref 6–23)
CALCIUM: 9.1 mg/dL (ref 8.4–10.5)
CHLORIDE: 101 meq/L (ref 96–112)
CO2: 17 meq/L — AB (ref 19–32)
CREATININE: 0.59 mg/dL (ref 0.50–1.10)
GLUCOSE: 153 mg/dL — AB (ref 70–99)
Potassium: 3.7 mEq/L (ref 3.7–5.3)
Sodium: 138 mEq/L (ref 137–147)
Total Protein: 7.6 g/dL (ref 6.0–8.3)

## 2013-09-02 LAB — URINALYSIS, ROUTINE W REFLEX MICROSCOPIC
Bilirubin Urine: NEGATIVE
Glucose, UA: NEGATIVE mg/dL
Hgb urine dipstick: NEGATIVE
Ketones, ur: NEGATIVE mg/dL
LEUKOCYTES UA: NEGATIVE
NITRITE: NEGATIVE
PROTEIN: NEGATIVE mg/dL
Specific Gravity, Urine: 1.033 — ABNORMAL HIGH (ref 1.005–1.030)
UROBILINOGEN UA: 0.2 mg/dL (ref 0.0–1.0)
pH: 5.5 (ref 5.0–8.0)

## 2013-09-02 LAB — LIPASE, BLOOD: LIPASE: 21 U/L (ref 11–59)

## 2013-09-02 MED ORDER — KETOROLAC TROMETHAMINE 30 MG/ML IJ SOLN
30.0000 mg | Freq: Once | INTRAMUSCULAR | Status: AC
  Administered 2013-09-02: 30 mg via INTRAVENOUS
  Filled 2013-09-02: qty 1

## 2013-09-02 MED ORDER — PROMETHAZINE HCL 25 MG PO TABS: 25.0000 mg | ORAL_TABLET | Freq: Four times a day (QID) | ORAL | Status: DC | PRN

## 2013-09-02 MED ORDER — SODIUM CHLORIDE 0.9 % IV BOLUS (SEPSIS)
1000.0000 mL | Freq: Once | INTRAVENOUS | Status: AC
  Administered 2013-09-02: 1000 mL via INTRAVENOUS

## 2013-09-02 MED ORDER — TRAMADOL HCL 50 MG PO TABS: 50.0000 mg | ORAL_TABLET | Freq: Four times a day (QID) | ORAL | Status: DC | PRN

## 2013-09-02 MED ORDER — ONDANSETRON 4 MG PO TBDP
8.0000 mg | ORAL_TABLET | Freq: Once | ORAL | Status: AC
  Administered 2013-09-02: 8 mg via ORAL
  Filled 2013-09-02: qty 2

## 2013-09-02 NOTE — ED Provider Notes (Signed)
[name]: [id number]     Arrival date & time 09/02/13  1018 History   First MD Initiated Contact with Patient 09/02/13 1200     Chief Complaint  Patient presents with  . Diarrhea  . Emesis     (Consider location/radiation/quality/duration/timing/severity/associated sxs/prior Treatment) HPI  Patient to the ED with complaints of vomiting and diarrhea. She reports that her husband and both children have the same thing. Her two children are currently being seen in the pediatric ED.She reports that her diarrhea is now clear and watery, he vomit is bilious. She thinks that she is dehydrated and reports feeling weak and hot. She is a prediabetic and takes metformin, she is unsure of what her blood sugars have been running. She is tachycardic at 111 in triage. She denies having fevers or severe abdominal pains.  She has not noted any blood in her urine, vomit or diarrhea.  Past Medical History  Diagnosis Date  . Hernia   . H/O colonoscopy 2008    polyp removed  . Diabetes   . Dementia   . Sleep apnea   . Headache(784.0)     migraines  . Arthritis     left ankle  . Blood dyscrasia     lupus anticoagulant during pregnancy   Past Surgical History  Procedure Laterality Date  . Dilation and curettage of uterus  2004  . Foot surgery  2008  . Ventral hernia repair  09/20/10    Laparoscopic, Dr Greer Pickerel  . Hernia repair  09/19/2011  . Wisdom tooth extraction    . Cesarean section with bilateral tubal ligation Bilateral 02/11/2013    Procedure: Repeat CESAREAN SECTION WITH BILATERAL TUBAL LIGATION;  Surgeon: Lovenia Kim, MD;  Location: [facility];  Service: Obstetrics;  Laterality: Bilateral;  EDD: 03/01/13   Family History  Problem Relation Age of Onset  . Diabetes Mother   . Hypertension Mother   . Diabetes Father   . Hypertension Father    History  Substance Use Topics  . Smoking status: Former Smoker -- 0.30 packs/day    Types: Cigarettes    Quit date: 05/11/2012  . Smokeless  tobacco: Never Used  . Alcohol Use: Yes     Comment: "once every three years"    OB History   Grav Para Term Preterm Abortions TAB SAB Ect Mult Living   4 2 1  2     2      Review of Systems   Review of Systems  Gen: no weight loss, fevers, chills, night sweats  Eyes: no discharge or drainage, no occular pain or visual changes  Nose: no epistaxis or rhinorrhea  Mouth: no dental pain, no sore throat  Neck: no neck pain  Lungs:No wheezing, coughing or hemoptysis CV: no chest pain, palpitations, dependent edema or orthopnea  Abd: + abdominal pain, nausea, vomiting, diarrhea GU: no dysuria or gross hematuria  MSK:  No muscle weakness or pain Neuro: no headache, no focal neurologic deficits  Skin: no rash or wounds Psyche: no complaints    Allergies  Review of patient's allergies indicates no known allergies.  Home Medications   Prior to Admission medications   Medication Sig Start Date End Date Taking? Authorizing Provider  albuterol (PROVENTIL HFA;VENTOLIN HFA) 108 (90 BASE) MCG/ACT inhaler Inhale 2 puffs into the lungs every 6 (six) hours as needed for wheezing or shortness of breath.   Yes Historical Provider, MD  ibuprofen (ADVIL,MOTRIN) 200 MG tablet Take 200 mg by mouth every 8 (  eight) hours as needed for moderate pain.   Yes Historical Provider, MD  metFORMIN (GLUCOPHAGE) 500 MG tablet Take 1 tablet (500 mg total) by mouth 2 (two) times daily with a meal. 1 tablet with breakfast and 1 tablet with evening meal 02/14/13  Yes Rolitta Octaviano Glow, CNM  ranitidine (ZANTAC) 150 MG tablet Take 150 mg by mouth 2 (two) times daily as needed. For acid reflux   Yes Historical Provider, MD   BP 110/90  Pulse 102  Temp(Src) 98.6 F (37 C) (Oral)  Resp 16  Wt 284 lb 4 oz (128.935 kg)  SpO2 97% Physical Exam  Nursing note and vitals reviewed. Constitutional: She appears well-developed and well-nourished. She appears ill. No distress.  HENT:  Head: Normocephalic and atraumatic.   Eyes: Pupils are equal, round, and reactive to light.  Neck: Normal range of motion. Neck supple.  Cardiovascular: Normal rate and regular rhythm.   Pulmonary/Chest: Effort normal.  Abdominal: Soft.  Neurological: She is alert.  Skin: Skin is warm and dry.    ED Course  Procedures (including critical care time) Labs Review Labs Reviewed  CBC WITH DIFFERENTIAL - Abnormal; Notable for the following:    WBC 14.0 (*)    RBC 5.83 (*)    MCV 70.0 (*)    MCH 21.6 (*)    RDW 17.8 (*)    Neutrophils Relative % 94 (*)    Lymphocytes Relative 4 (*)    Monocytes Relative 2 (*)    Neutro Abs 13.1 (*)    Lymphs Abs 0.6 (*)    All other components within normal limits  COMPREHENSIVE METABOLIC PANEL - Abnormal; Notable for the following:    CO2 17 (*)    Glucose, Bld 153 (*)    All other components within normal limits  URINALYSIS, ROUTINE W REFLEX MICROSCOPIC - Abnormal; Notable for the following:    Color, Urine AMBER (*)    APPearance CLOUDY (*)    Specific Gravity, Urine 1.033 (*)    All other components within normal limits  I-STAT ARTERIAL BLOOD GAS, ED - Abnormal; Notable for the following:    pCO2 arterial 30.7 (*)    Bicarbonate 19.8 (*)    Acid-base deficit 4.0 (*)    All other components within normal limits  LIPASE, BLOOD  PREGNANCY, URINE    Imaging Review No results found.   EKG Interpretation None      MDM   Final diagnoses:  Dehydration  Viral gastroenteritis    Patient has a small metabolic acidosis most likely due to the diarrhea and vomiting but her potassium is normal. She will need to be hydrated and oral challenged. I gave 2 L NS as well as pain and nausea medication. She is feeling significantly better. Both her husband and children are all doing well also. She has physiologic orthostatic vital signs. I feel that patient is safe to discharge at this time with pt education on how to maintain fluids, pain and nausea medication.  I discussed this with  Dr. Zenia Resides who agrees with my plan.  33 y.o.Kathleen Walsh's evaluation in the Emergency Department is complete. It has been determined that no acute conditions requiring further emergency intervention are present at this time. The patient/guardian have been advised of the diagnosis and plan. We have discussed signs and symptoms that warrant return to the ED, such as changes or worsening in symptoms.  Vital signs are stable at discharge. Filed Vitals:   09/02/13 1439  BP: 110/90  Pulse: 102  Temp:   Resp:     Patient/guardian has voiced understanding and agreed to follow-up with the PCP or specialist.     Linus Mako, PA-C 09/02/13 1446

## 2013-09-02 NOTE — Discharge Instructions (Signed)
Dehydration, Adult Dehydration is when you lose more fluids from the body than you take in. Vital organs like the kidneys, brain, and heart cannot function without a proper amount of fluids and salt. Any loss of fluids from the body can cause dehydration.  CAUSES   Vomiting.  Diarrhea.  Excessive sweating.  Excessive urine output.  Fever. SYMPTOMS  Mild dehydration  Thirst.  Dry lips.  Slightly dry mouth. Moderate dehydration  Very dry mouth.  Sunken eyes.  Skin does not bounce back quickly when lightly pinched and released.  Dark urine and decreased urine production.  Decreased tear production.  Headache. Severe dehydration  Very dry mouth.  Extreme thirst.  Rapid, weak pulse (more than 100 beats per minute at rest).  Cold hands and feet.  Not able to sweat in spite of heat and temperature.  Rapid breathing.  Blue lips.  Confusion and lethargy.  Difficulty being awakened.  Minimal urine production.  No tears. DIAGNOSIS  Your caregiver will diagnose dehydration based on your symptoms and your exam. Blood and urine tests will help confirm the diagnosis. The diagnostic evaluation should also identify the cause of dehydration. TREATMENT  Treatment of mild or moderate dehydration can often be done at home by increasing the amount of fluids that you drink. It is best to drink small amounts of fluid more often. Drinking too much at one time can make vomiting worse. Refer to the home care instructions below. Severe dehydration needs to be treated at the hospital where you will probably be given intravenous (IV) fluids that contain water and electrolytes. HOME CARE INSTRUCTIONS   Ask your caregiver about specific rehydration instructions.  Drink enough fluids to keep your urine clear or pale yellow.  Drink small amounts frequently if you have nausea and vomiting.  Eat as you normally do.  Avoid:  Foods or drinks high in sugar.  Carbonated  drinks.  Juice.  Extremely hot or cold fluids.  Drinks with caffeine.  Fatty, greasy foods.  Alcohol.  Tobacco.  Overeating.  Gelatin desserts.  Wash your hands well to avoid spreading bacteria and viruses.  Only take over-the-counter or prescription medicines for pain, discomfort, or fever as directed by your caregiver.  Ask your caregiver if you should continue all prescribed and over-the-counter medicines.  Keep all follow-up appointments with your caregiver. SEEK MEDICAL CARE IF:  You have abdominal pain and it increases or stays in one area (localizes).  You have a rash, stiff neck, or severe headache.  You are irritable, sleepy, or difficult to awaken.  You are weak, dizzy, or extremely thirsty. SEEK IMMEDIATE MEDICAL CARE IF:   You are unable to keep fluids down or you get worse despite treatment.  You have frequent episodes of vomiting or diarrhea.  You have blood or green matter (bile) in your vomit.  You have blood in your stool or your stool looks black and tarry.  You have not urinated in 6 to 8 hours, or you have only urinated a small amount of very dark urine.  You have a fever.  You faint. MAKE SURE YOU:   Understand these instructions.  Will watch your condition.  Will get help right away if you are not doing well or get worse. Document Released: 04/18/2005 Document Revised: 07/11/2011 Document Reviewed: 12/06/2010 [facility] Patient Information 2014 [place], [place].  Viral Gastroenteritis Viral gastroenteritis is also known as stomach flu. This condition affects the stomach and intestinal tract. It can cause sudden diarrhea and vomiting. The illness  typically lasts 3 to 8 days. Most people develop an immune response that eventually gets rid of the virus. While this natural response develops, the virus can make you quite ill. CAUSES  Many different viruses can cause gastroenteritis, such as rotavirus or noroviruses. You can catch one of  these viruses by consuming contaminated food or water. You may also catch a virus by sharing utensils or other personal items with an infected person or by touching a contaminated surface. SYMPTOMS  The most common symptoms are diarrhea and vomiting. These problems can cause a severe loss of body fluids (dehydration) and a body salt (electrolyte) imbalance. Other symptoms may include:  Fever.  Headache.  Fatigue.  Abdominal pain. DIAGNOSIS  Your caregiver can usually diagnose viral gastroenteritis based on your symptoms and a physical exam. A stool sample may also be taken to test for the presence of viruses or other infections. TREATMENT  This illness typically goes away on its own. Treatments are aimed at rehydration. The most serious cases of viral gastroenteritis involve vomiting so severely that you are not able to keep fluids down. In these cases, fluids must be given through an intravenous line (IV). HOME CARE INSTRUCTIONS   Drink enough fluids to keep your urine clear or pale yellow. Drink small amounts of fluids frequently and increase the amounts as tolerated.  Ask your caregiver for specific rehydration instructions.  Avoid:  Foods high in sugar.  Alcohol.  Carbonated drinks.  Tobacco.  Juice.  Caffeine drinks.  Extremely hot or cold fluids.  Fatty, greasy foods.  Too much intake of anything at one time.  Dairy products until 24 to 48 hours after diarrhea stops.  You may consume probiotics. Probiotics are active cultures of beneficial bacteria. They may lessen the amount and number of diarrheal stools in adults. Probiotics can be found in yogurt with active cultures and in supplements.  Wash your hands well to avoid spreading the virus.  Only take over-the-counter or prescription medicines for pain, discomfort, or fever as directed by your caregiver. Do not give aspirin to children. Antidiarrheal medicines are not recommended.  Ask your caregiver if you  should continue to take your regular prescribed and over-the-counter medicines.  Keep all follow-up appointments as directed by your caregiver. SEEK IMMEDIATE MEDICAL CARE IF:   You are unable to keep fluids down.  You do not urinate at least once every 6 to 8 hours.  You develop shortness of breath.  You notice blood in your stool or vomit. This may look like coffee grounds.  You have abdominal pain that increases or is concentrated in one small area (localized).  You have persistent vomiting or diarrhea.  You have a fever.  The patient is a child younger than 3 months, and he or she has a fever.  The patient is a child older than 3 months, and he or she has a fever and persistent symptoms.  The patient is a child older than 3 months, and he or she has a fever and symptoms suddenly get worse.  The patient is a baby, and he or she has no tears when crying. MAKE SURE YOU:   Understand these instructions.  Will watch your condition.  Will get help right away if you are not doing well or get worse. Document Released: 04/18/2005 Document Revised: 07/11/2011 Document Reviewed: 02/02/2011 [facility] Patient Information 2014 [place]. Diet for Diarrhea, Adult Frequent, runny stools (diarrhea) may be caused or worsened by food or drink. Diarrhea may be  relieved by changing your diet. Since diarrhea can last up to 7 days, it is easy for you to lose too much fluid from the body and become dehydrated. Fluids that are lost need to be replaced. Along with a modified diet, make sure you drink enough fluids to keep your urine clear or pale yellow. DIET INSTRUCTIONS  Ensure adequate fluid intake (hydration): have 1 cup (8 oz) of fluid for each diarrhea episode. Avoid fluids that contain simple sugars or sports drinks, fruit juices, whole milk products, and sodas. Your urine should be clear or pale yellow if you are drinking enough fluids. Hydrate with an oral rehydration solution that you  can purchase at pharmacies, retail stores, and online. You can prepare an oral rehydration solution at home by mixing the following ingredients together:    tsp table salt.   tsp baking soda.   tsp salt substitute containing potassium chloride.  1  tablespoons sugar.  1 L (34 oz) of water.  Certain foods and beverages may increase the speed at which food moves through the gastrointestinal (GI) tract. These foods and beverages should be avoided and include:  Caffeinated and alcoholic beverages.  High-fiber foods, such as raw fruits and vegetables, nuts, seeds, and whole grain breads and cereals.  Foods and beverages sweetened with sugar alcohols, such as xylitol, sorbitol, and mannitol.  Some foods may be well tolerated and may help thicken stool including:  Starchy foods, such as rice, toast, pasta, low-sugar cereal, oatmeal, grits, baked potatoes, crackers, and bagels.   Bananas.   Applesauce.  Add probiotic-rich foods to help increase healthy bacteria in the GI tract, such as yogurt and fermented milk products. RECOMMENDED FOODS AND BEVERAGES Starches Choose foods with less than 2 g of fiber per serving.  Recommended:  White, [place], and pita breads, plain rolls, buns, bagels. Plain muffins, matzo. Soda, saltine, or graham crackers. Pretzels, melba toast, zwieback. Cooked cereals made with water: cornmeal, farina, cream cereals. Dry cereals: refined corn, wheat, rice. Potatoes prepared any way without skins, refined macaroni, spaghetti, noodles, refined rice.  Avoid:  Bread, rolls, or crackers made with whole wheat, multi-grains, rye, bran seeds, nuts, or coconut. Corn tortillas or taco shells. Cereals containing whole grains, multi-grains, bran, coconut, nuts, raisins. Cooked or dry oatmeal. Coarse wheat cereals, granola. Cereals advertised as "high-fiber." Potato skins. Whole grain pasta, wild or brown rice. Popcorn. Sweet potatoes, yams. Sweet rolls, doughnuts, waffles,  pancakes, sweet breads. Vegetables  Recommended: Strained tomato and vegetable juices. Most well-cooked and canned vegetables without seeds. Fresh: Tender lettuce, cucumber without the skin, cabbage, spinach, bean sprouts.  Avoid: Fresh, cooked, or canned: Artichokes, baked beans, beet greens, broccoli, Brussels sprouts, corn, kale, legumes, peas, sweet potatoes. Cooked: Green or red cabbage, spinach. Avoid large servings of any vegetables because vegetables shrink when cooked, and they contain more fiber per serving than fresh vegetables. Fruit  Recommended: Cooked or canned: Apricots, applesauce, cantaloupe, cherries, fruit cocktail, grapefruit, grapes, kiwi, mandarin oranges, peaches, pears, plums, watermelon. Fresh: Apples without skin, ripe banana, grapes, cantaloupe, cherries, grapefruit, peaches, oranges, plums. Keep servings limited to  cup or 1 piece.  Avoid: Fresh: Apples with skin, apricots, mangoes, pears, raspberries, strawberries. Prune juice, stewed or dried prunes. Dried fruits, raisins, dates. Large servings of all fresh fruits. Protein  Recommended: Ground or well-cooked tender beef, ham, veal, lamb, pork, or poultry. Eggs. Fish, oysters, shrimp, lobster, other seafoods. Liver, organ meats.  Avoid: Tough, fibrous meats with gristle. Peanut butter, smooth or chunky. Cheese, nuts,  seeds, legumes, dried peas, beans, lentils. Dairy  Recommended: Yogurt, lactose-free milk, kefir, drinkable yogurt, buttermilk, soy milk, or plain hard cheese.  Avoid: Milk, chocolate milk, beverages made with milk, such as milkshakes. Soups  Recommended: Bouillon, broth, or soups made from allowed foods. Any strained soup.  Avoid: Soups made from vegetables that are not allowed, cream or milk-based soups. Desserts and Sweets  Recommended: Sugar-free gelatin, sugar-free frozen ice pops made without sugar alcohol.  Avoid: Plain cakes and cookies, pie made with fruit, pudding, custard, cream  pie. Gelatin, fruit, ice, sherbet, frozen ice pops. Ice cream, ice milk without nuts. Plain hard candy, honey, jelly, molasses, syrup, sugar, chocolate syrup, gumdrops, marshmallows. Fats and Oils  Recommended: Limit fats to less than 8 tsp per day.  Avoid: Seeds, nuts, olives, avocados. Margarine, butter, cream, mayonnaise, salad oils, plain salad dressings. Plain gravy, crisp bacon without rind. Beverages  Recommended: Water, decaffeinated teas, oral rehydration solutions, sugar-free beverages not sweetened with sugar alcohols.  Avoid: Fruit juices, caffeinated beverages (coffee, tea, soda), alcohol, sports drinks, or lemon-lime soda. Condiments  Recommended: Ketchup, mustard, horseradish, vinegar, cocoa powder. Spices in moderation: allspice, basil, bay leaves, celery powder or leaves, cinnamon, cumin powder, curry powder, ginger, mace, marjoram, onion or garlic powder, oregano, paprika, parsley flakes, ground pepper, rosemary, sage, savory, tarragon, thyme, turmeric.  Avoid: Coconut, honey. Document Released: 07/09/2003 Document Revised: 01/11/2012 Document Reviewed: 09/02/2011 [facility] Patient Information 2014 [place].

## 2013-09-02 NOTE — ED Notes (Signed)
Patient reports she and her family ate at the same restaurant.  The entire family now has sx.  Patient reports onset of n/v/d since last night.  She reports emesis multiple and diarrhea hourly.  Patient complains of abd pain

## 2013-09-02 NOTE — ED Notes (Signed)
Pt able to tolerate oral fluids and cracker.

## 2013-09-05 NOTE — ED Provider Notes (Signed)
Medical screening examination/treatment/procedure(s) were performed by non-physician practitioner and as supervising physician I was immediately available for consultation/collaboration.   Leota Jacobsen, MD 09/05/13 2322

## 2013-12-19 ENCOUNTER — Emergency Department (HOSPITAL_COMMUNITY)
Admission: EM | Admit: 2013-12-19 | Discharge: 2013-12-19 | Disposition: A | Discharge status: Home / Self Care | Payer: BC Managed Care – PPO | Source: Home / Self Care | Attending: Emergency Medicine | Admitting: Emergency Medicine

## 2013-12-19 ENCOUNTER — Encounter (HOSPITAL_COMMUNITY): Payer: Self-pay | Admitting: Emergency Medicine

## 2013-12-19 DIAGNOSIS — B9789 Other viral agents as the cause of diseases classified elsewhere: Secondary | ICD-10-CM

## 2013-12-19 DIAGNOSIS — J069 Acute upper respiratory infection, unspecified: Secondary | ICD-10-CM

## 2013-12-19 DIAGNOSIS — H9209 Otalgia, unspecified ear: Secondary | ICD-10-CM

## 2013-12-19 DIAGNOSIS — H6500 Acute serous otitis media, unspecified ear: Secondary | ICD-10-CM

## 2013-12-19 DIAGNOSIS — H9203 Otalgia, bilateral: Secondary | ICD-10-CM

## 2013-12-19 DIAGNOSIS — H6502 Acute serous otitis media, left ear: Secondary | ICD-10-CM

## 2013-12-19 MED ORDER — HYDROCOD POLST-CHLORPHEN POLST 10-8 MG/5ML PO LQCR: 5.0000 mL | Freq: Every evening | ORAL | Status: DC | PRN

## 2013-12-19 MED ORDER — CHLORPHENIRAMINE-PSE-IBUPROFEN 2-30-200 MG PO TABS: ORAL_TABLET | ORAL | Status: DC

## 2013-12-19 MED ORDER — PREDNISONE 10 MG PO TABS: ORAL_TABLET | ORAL | Status: DC

## 2013-12-19 MED ORDER — ANTIPYRINE-BENZOCAINE 5.4-1.4 % OT SOLN: 3.0000 [drp] | OTIC | Status: DC | PRN

## 2013-12-19 MED ORDER — FLUTICASONE PROPIONATE 50 MCG/ACT NA SUSP: 2.0000 | Freq: Two times a day (BID) | NASAL | Status: DC

## 2013-12-19 NOTE — ED Provider Notes (Signed)
Medical screening examination/treatment/procedure(s) were performed by non-physician practitioner and as supervising physician I was immediately available for consultation/collaboration.  Philipp Deputy, M.D.  Harden Mo, MD 12/19/13 [phone]

## 2013-12-19 NOTE — ED Provider Notes (Signed)
[name]: [id number]     Arrival date & time 12/19/13  0801 History   First MD Initiated Contact with Patient 12/19/13 [phone]     Chief Complaint  Patient presents with  . Otalgia   (Consider location/radiation/quality/duration/timing/severity/associated sxs/prior Treatment) HPI Comments: 84f presents for eval of bilateral earache, worse on left, and cough.  Started 2 days ago with acute onset of ear ache and sore throat. The sore throat has resolved but she is still having the earache which is worsening, and she has a cough that keeps her awake at night. She has been taking Tylenol cold medication without relief of symptoms. No fever, chills, NVD, chest pain, shortness of breath. No recent travel or sick contacts.   Past Medical History  Diagnosis Date  . Hernia   . H/O colonoscopy 2008    polyp removed  . Diabetes   . Dementia   . Sleep apnea   . Headache(784.0)     migraines  . Arthritis     left ankle  . Blood dyscrasia     lupus anticoagulant during pregnancy   Past Surgical History  Procedure Laterality Date  . Dilation and curettage of uterus  2004  . Foot surgery  2008  . Ventral hernia repair  09/20/10    Laparoscopic, Dr Greer Pickerel  . Hernia repair  09/19/2011  . Wisdom tooth extraction    . Cesarean section with bilateral tubal ligation Bilateral 02/11/2013    Procedure: Repeat CESAREAN SECTION WITH BILATERAL TUBAL LIGATION;  Surgeon: Lovenia Kim, MD;  Location: [facility];  Service: Obstetrics;  Laterality: Bilateral;  EDD: 03/01/13   Family History  Problem Relation Age of Onset  . Diabetes Mother   . Hypertension Mother   . Diabetes Father   . Hypertension Father    History  Substance Use Topics  . Smoking status: Former Smoker -- 0.30 packs/day    Types: Cigarettes    Quit date: 05/11/2012  . Smokeless tobacco: Never Used  . Alcohol Use: Yes     Comment: "once every three years"    OB History   Grav Para Term Preterm Abortions TAB SAB Ect Mult Living   4  2 1  2     2      Review of Systems  Constitutional: Negative for fever and chills.  HENT: Positive for congestion, ear pain, sinus pressure and sore throat. Negative for ear discharge.   Respiratory: Positive for cough. Negative for chest tightness and shortness of breath.   Cardiovascular: Negative for chest pain.  All other systems reviewed and are negative.   Allergies  Review of patient's allergies indicates no known allergies.  Home Medications   Prior to Admission medications   Medication Sig Start Date End Date Taking? Authorizing Provider  albuterol (PROVENTIL HFA;VENTOLIN HFA) 108 (90 BASE) MCG/ACT inhaler Inhale 2 puffs into the lungs every 6 (six) hours as needed for wheezing or shortness of breath.    Historical Provider, MD  chlorpheniramine-HYDROcodone (TUSSIONEX PENNKINETIC ER) 10-8 MG/5ML LQCR Take 5 mLs by mouth at bedtime as needed for cough. 12/19/13   Liam Graham, PA-C  Chlorpheniramine-PSE-Ibuprofen (ADVIL ALLERGY SINUS) 2-30-200 MG TABS 1-2 tabs PO Q4-6 hrs PRN 12/19/13   Liam Graham, PA-C  fluticasone (FLONASE) 50 MCG/ACT nasal spray Place 2 sprays into both nostrils 2 (two) times daily. Decrease to 2 sprays/nostril daily after 5 days 12/19/13   Liam Graham, PA-C  ibuprofen (ADVIL,MOTRIN) 200 MG tablet Take 200 mg by mouth  every 8 (eight) hours as needed for moderate pain.    Historical Provider, MD  metFORMIN (GLUCOPHAGE) 500 MG tablet Take 1 tablet (500 mg total) by mouth 2 (two) times daily with a meal. 1 tablet with breakfast and 1 tablet with evening meal 02/14/13   Laury Deep, CNM  predniSONE (DELTASONE) 10 MG tablet 4 tabs PO QD for 4 days; 3 tabs PO QD for 3 days; 2 tabs PO QD for 2 days; 1 tab PO QD for 1 day 12/19/13   Liam Graham, PA-C  promethazine (PHENERGAN) 25 MG tablet Take 1 tablet (25 mg total) by mouth every 6 (six) hours as needed for nausea or vomiting. 09/02/13   Tiffany Marilu Favre, PA-C  ranitidine (ZANTAC) 150 MG tablet Take 150 mg  by mouth 2 (two) times daily as needed. For acid reflux    Historical Provider, MD  traMADol (ULTRAM) 50 MG tablet Take 1 tablet (50 mg total) by mouth every 6 (six) hours as needed. 09/02/13   Tiffany Marilu Favre, PA-C   BP 141/81  Pulse 84  Temp(Src) 98.4 F (36.9 C) (Oral)  Resp 18  SpO2 98%  LMP 12/19/2013 Physical Exam  Nursing note and vitals reviewed. Constitutional: She is oriented to person, place, and time. Vital signs are normal. She appears well-developed and well-nourished. No distress.  HENT:  Head: Normocephalic and atraumatic.  Right Ear: Tympanic membrane, external ear and ear canal normal.  Left Ear: External ear and ear canal normal. Tympanic membrane is injected and bulging. A middle ear effusion (serous) is present.  Nose: Mucosal edema present. Right sinus exhibits no maxillary sinus tenderness and no frontal sinus tenderness. Left sinus exhibits no maxillary sinus tenderness and no frontal sinus tenderness.  Mouth/Throat: Uvula is midline, oropharynx is clear and moist and mucous membranes are normal.  Eyes: Conjunctivae are normal. Right eye exhibits no discharge. Left eye exhibits no discharge.  Neck: Normal range of motion. Neck supple.  Cardiovascular: Normal rate, regular rhythm and normal heart sounds.   Pulmonary/Chest: Effort normal and breath sounds normal. No respiratory distress.  Lymphadenopathy:    She has no cervical adenopathy.  Neurological: She is alert and oriented to person, place, and time. She has normal strength. Coordination normal.  Skin: Skin is warm and dry. No rash noted. She is not diaphoretic.  Psychiatric: She has a normal mood and affect. Judgment normal.    ED Course  Procedures (including critical care time) Labs Review Labs Reviewed - No data to display  Imaging Review No results found.   MDM   1. Otalgia, bilateral   2. Viral URI with cough   3. Acute serous otitis media of left ear, recurrence not specified    Most  likely viral with normal vitals, afebrile, with serous otitis media. Treating symptomatically, followup on Monday for reevaluation if no improvement.  Meds ordered this encounter  Medications  . predniSONE (DELTASONE) 10 MG tablet    Sig: 4 tabs PO QD for 4 days; 3 tabs PO QD for 3 days; 2 tabs PO QD for 2 days; 1 tab PO QD for 1 day    Dispense:  30 tablet    Refill:  0    Order Specific Question:  Supervising Provider    Answer:  Jake Michaelis, DAVID C [id number]  . fluticasone (FLONASE) 50 MCG/ACT nasal spray    Sig: Place 2 sprays into both nostrils 2 (two) times daily. Decrease to 2 sprays/nostril daily after 5 days  Dispense:  16 g    Refill:  2    Order Specific Question:  Supervising Provider    Answer:  Jake Michaelis, DAVID C [id number]  . Chlorpheniramine-PSE-Ibuprofen (ADVIL ALLERGY SINUS) 2-30-200 MG TABS    Sig: 1-2 tabs PO Q4-6 hrs PRN    Dispense:  30 each    Refill:  1    Order Specific Question:  Supervising Provider    Answer:  Jake Michaelis, DAVID C [id number]  . chlorpheniramine-HYDROcodone (TUSSIONEX PENNKINETIC ER) 10-8 MG/5ML LQCR    Sig: Take 5 mLs by mouth at bedtime as needed for cough.    Dispense:  50 mL    Refill:  0    Order Specific Question:  Supervising Provider    Answer:  Jake Michaelis, DAVID C [id number]  . antipyrine-benzocaine (AURALGAN) otic solution 3-4 drop    Sig:       Liam Graham, PA-C 12/19/13 [phone]

## 2013-12-19 NOTE — ED Notes (Signed)
Pt     Reports     Symptoms  Of  Earache  And  Drainage         Both  Ears    l  Worse  Than the  r        Pt reports       Some  Nasal  Congestion /  stuffyness  As  Well           Pt   Sitting  Upright on the  Exam table  Speaking in  Complete  Sentances    And  Is  In no  Distress

## 2013-12-19 NOTE — Discharge Instructions (Signed)
Ear Drops You have been diagnosed with a condition requiring you to put drops of medicine into your outer ear. HOME CARE INSTRUCTIONS   Put drops in the affected ear as instructed. After putting the drops in, you will need to lie down with the affected ear facing up for ten minutes so the drops will remain in the ear canal and run down and fill the canal. Continue using the ear drops for as long as directed by your health care provider.  Prior to getting up, put a cotton ball gently in your ear canal. Leave enough of the cotton ball out so it can be easily removed. Do not attempt to push this down into the canal with a cotton-tipped swab or other instrument.  Do not irrigate or wash out your ears if you have had a perforated eardrum or mastoid surgery, or unless instructed to do so by your health care provider.  Keep appointments with your health care provider as instructed.  Finish all medicine, or use for the length of time prescribed by your health care provider. Continue the drops even if your problem seems to be doing well after a couple days, or continue as instructed. SEEK MEDICAL CARE IF:  You become worse or develop increasing pain.  You notice any unusual drainage from your ear (particularly if the drainage has a bad smell).  You develop hearing difficulties.  You experience a serious form of dizziness in which you feel as if the room is spinning, and you feel nauseated (vertigo).  The outside of your ear becomes red or swollen or both. This may be a sign of an allergic reaction. MAKE SURE YOU:   Understand these instructions.  Will watch your condition.  Will get help right away if you are not doing well or get worse. Document Released: 04/12/2001 Document Revised: 04/23/2013 Document Reviewed: 11/13/2012 [facility] Patient Information 2015 [place], [place]. This information is not intended to replace advice given to you by your health care provider. Make sure you discuss any  questions you have with your health care provider.  Serous Otitis Media Serous otitis media is fluid in the middle ear space. This space contains the bones for hearing and air. Air in the middle ear space helps to transmit sound.  The air gets there through the eustachian tube. This tube goes from the back of the nose (nasopharynx) to the middle ear space. It keeps the pressure in the middle ear the same as the outside world. It also helps to drain fluid from the middle ear space. CAUSES  Serous otitis media occurs when the eustachian tube gets blocked. Blockage can come from:  Ear infections.  Colds and other upper respiratory infections.  Allergies.  Irritants such as cigarette smoke.  Sudden changes in air pressure (such as descending in an airplane).  Enlarged adenoids.  A mass in the nasopharynx. During colds and upper respiratory infections, the middle ear space can become temporarily filled with fluid. This can happen after an ear infection also. Once the infection clears, the fluid will generally drain out of the ear through the eustachian tube. If it does not, then serous otitis media occurs. SIGNS AND SYMPTOMS   Hearing loss.  A feeling of fullness in the ear, without pain.  Young children may not show any symptoms but may show slight behavioral changes, such as agitation, ear pulling, or crying. DIAGNOSIS  Serous otitis media is diagnosed by an ear exam. Tests may be done to check on the  movement of the eardrum. Hearing exams may also be done. TREATMENT  The fluid most often goes away without treatment. If allergy is the cause, allergy treatment may be helpful. Fluid that persists for several months may require minor surgery. A small tube is placed in the eardrum to:  Drain the fluid.  Restore the air in the middle ear space. In certain situations, antibiotic medicines are used to avoid surgery. Surgery may be done to remove enlarged adenoids (if this is the  cause). HOME CARE INSTRUCTIONS   Keep children away from tobacco smoke.  Keep all follow-up visits as directed by your health care provider. SEEK MEDICAL CARE IF:   Your hearing is not better in 3 months.  Your hearing is worse.  You have ear pain.  You have drainage from the ear.  You have dizziness.  You have serous otitis media only in one ear or have any bleeding from your nose (epistaxis).  You notice a lump on your neck. MAKE SURE YOU:  Understand these instructions.   Will watch your condition.   Will get help right away if you are not doing well or get worse.  Document Released: 07/09/2003 Document Revised: 09/02/2013 Document Reviewed: 11/13/2012 [facility] Patient Information 2015 [place], [place]. This information is not intended to replace advice given to you by your health care provider. Make sure you discuss any questions you have with your health care provider.  Upper Respiratory Infection, Adult An upper respiratory infection (URI) is also sometimes known as the common cold. The upper respiratory tract includes the nose, sinuses, throat, trachea, and bronchi. Bronchi are the airways leading to the lungs. Most people improve within 1 week, but symptoms can last up to 2 weeks. A residual cough may last even longer.  CAUSES Many different viruses can infect the tissues lining the upper respiratory tract. The tissues become irritated and inflamed and often become very moist. Mucus production is also common. A cold is contagious. You can easily spread the virus to others by oral contact. This includes kissing, sharing a glass, coughing, or sneezing. Touching your mouth or nose and then touching a surface, which is then touched by another person, can also spread the virus. SYMPTOMS  Symptoms typically develop 1 to 3 days after you come in contact with a cold virus. Symptoms vary from person to person. They may include:  Runny nose.  Sneezing.  Nasal  congestion.  Sinus irritation.  Sore throat.  Loss of voice (laryngitis).  Cough.  Fatigue.  Muscle aches.  Loss of appetite.  Headache.  Low-grade fever. DIAGNOSIS  You might diagnose your own cold based on familiar symptoms, since most people get a cold 2 to 3 times a year. Your caregiver can confirm this based on your exam. Most importantly, your caregiver can check that your symptoms are not due to another disease such as strep throat, sinusitis, pneumonia, asthma, or epiglottitis. Blood tests, throat tests, and X-rays are not necessary to diagnose a common cold, but they may sometimes be helpful in excluding other more serious diseases. Your caregiver will decide if any further tests are required. RISKS AND COMPLICATIONS  You may be at risk for a more severe case of the common cold if you smoke cigarettes, have chronic heart disease (such as heart failure) or lung disease (such as asthma), or if you have a weakened immune system. The very young and very old are also at risk for more serious infections. Bacterial sinusitis, middle ear infections, and bacterial  pneumonia can complicate the common cold. The common cold can worsen asthma and chronic obstructive pulmonary disease (COPD). Sometimes, these complications can require emergency medical care and may be life-threatening. PREVENTION  The best way to protect against getting a cold is to practice good hygiene. Avoid oral or hand contact with people with cold symptoms. Wash your hands often if contact occurs. There is no clear evidence that vitamin C, vitamin E, echinacea, or exercise reduces the chance of developing a cold. However, it is always recommended to get plenty of rest and practice good nutrition. TREATMENT  Treatment is directed at relieving symptoms. There is no cure. Antibiotics are not effective, because the infection is caused by a virus, not by bacteria. Treatment may include:  Increased fluid intake. Sports drinks  offer valuable electrolytes, sugars, and fluids.  Breathing heated mist or steam (vaporizer or shower).  Eating chicken soup or other clear broths, and maintaining good nutrition.  Getting plenty of rest.  Using gargles or lozenges for comfort.  Controlling fevers with ibuprofen or acetaminophen as directed by your caregiver.  Increasing usage of your inhaler if you have asthma. Zinc gel and zinc lozenges, taken in the first 24 hours of the common cold, can shorten the duration and lessen the severity of symptoms. Pain medicines may help with fever, muscle aches, and throat pain. A variety of non-prescription medicines are available to treat congestion and runny nose. Your caregiver can make recommendations and may suggest nasal or lung inhalers for other symptoms.  HOME CARE INSTRUCTIONS   Only take over-the-counter or prescription medicines for pain, discomfort, or fever as directed by your caregiver.  Use a warm mist humidifier or inhale steam from a shower to increase air moisture. This may keep secretions moist and make it easier to breathe.  Drink enough water and fluids to keep your urine clear or pale yellow.  Rest as needed.  Return to work when your temperature has returned to normal or as your caregiver advises. You may need to stay home longer to avoid infecting others. You can also use a face mask and careful hand washing to prevent spread of the virus. SEEK MEDICAL CARE IF:   After the first few days, you feel you are getting worse rather than better.  You need your caregiver's advice about medicines to control symptoms.  You develop chills, worsening shortness of breath, or brown or red sputum. These may be signs of pneumonia.  You develop yellow or brown nasal discharge or pain in the face, especially when you bend forward. These may be signs of sinusitis.  You develop a fever, swollen neck glands, pain with swallowing, or white areas in the back of your throat.  These may be signs of strep throat. SEEK IMMEDIATE MEDICAL CARE IF:   You have a fever.  You develop severe or persistent headache, ear pain, sinus pain, or chest pain.  You develop wheezing, a prolonged cough, cough up blood, or have a change in your usual mucus (if you have chronic lung disease).  You develop sore muscles or a stiff neck. Document Released: 10/12/2000 Document Revised: 07/11/2011 Document Reviewed: 07/24/2013 [facility] Patient Information 2015 [place], [place]. This information is not intended to replace advice given to you by your health care provider. Make sure you discuss any questions you have with your health care provider.

## 2014-03-03 ENCOUNTER — Encounter (HOSPITAL_COMMUNITY): Payer: Self-pay | Admitting: Emergency Medicine

## 2014-05-02 HISTORY — PX: ABDOMINAL HYSTERECTOMY: SHX81

## 2014-08-11 ENCOUNTER — Emergency Department (HOSPITAL_COMMUNITY): Payer: BLUE CROSS/BLUE SHIELD

## 2014-08-11 ENCOUNTER — Encounter (HOSPITAL_COMMUNITY): Payer: Self-pay | Admitting: Emergency Medicine

## 2014-08-11 ENCOUNTER — Emergency Department (HOSPITAL_COMMUNITY)
Admission: EM | Admit: 2014-08-11 | Discharge: 2014-08-11 | Disposition: A | Discharge status: Home / Self Care | Payer: BLUE CROSS/BLUE SHIELD | Attending: Emergency Medicine | Admitting: Emergency Medicine

## 2014-08-11 DIAGNOSIS — R0602 Shortness of breath: Secondary | ICD-10-CM | POA: Insufficient documentation

## 2014-08-11 DIAGNOSIS — Z87891 Personal history of nicotine dependence: Secondary | ICD-10-CM | POA: Insufficient documentation

## 2014-08-11 DIAGNOSIS — R079 Chest pain, unspecified: Secondary | ICD-10-CM | POA: Diagnosis present

## 2014-08-11 DIAGNOSIS — Z8659 Personal history of other mental and behavioral disorders: Secondary | ICD-10-CM | POA: Diagnosis not present

## 2014-08-11 DIAGNOSIS — Z8719 Personal history of other diseases of the digestive system: Secondary | ICD-10-CM | POA: Insufficient documentation

## 2014-08-11 DIAGNOSIS — E119 Type 2 diabetes mellitus without complications: Secondary | ICD-10-CM | POA: Insufficient documentation

## 2014-08-11 DIAGNOSIS — M19072 Primary osteoarthritis, left ankle and foot: Secondary | ICD-10-CM | POA: Diagnosis not present

## 2014-08-11 DIAGNOSIS — Z862 Personal history of diseases of the blood and blood-forming organs and certain disorders involving the immune mechanism: Secondary | ICD-10-CM | POA: Insufficient documentation

## 2014-08-11 DIAGNOSIS — Z9889 Other specified postprocedural states: Secondary | ICD-10-CM | POA: Diagnosis not present

## 2014-08-11 DIAGNOSIS — R1013 Epigastric pain: Secondary | ICD-10-CM | POA: Diagnosis not present

## 2014-08-11 DIAGNOSIS — Z8639 Personal history of other endocrine, nutritional and metabolic disease: Secondary | ICD-10-CM | POA: Insufficient documentation

## 2014-08-11 DIAGNOSIS — Z79899 Other long term (current) drug therapy: Secondary | ICD-10-CM | POA: Insufficient documentation

## 2014-08-11 DIAGNOSIS — Z7951 Long term (current) use of inhaled steroids: Secondary | ICD-10-CM | POA: Diagnosis not present

## 2014-08-11 DIAGNOSIS — M549 Dorsalgia, unspecified: Secondary | ICD-10-CM | POA: Diagnosis not present

## 2014-08-11 LAB — BASIC METABOLIC PANEL
Anion gap: 11 (ref 5–15)
BUN: 12 mg/dL (ref 6–23)
CO2: 22 mmol/L (ref 19–32)
CREATININE: 0.65 mg/dL (ref 0.50–1.10)
Calcium: 9 mg/dL (ref 8.4–10.5)
Chloride: 107 mmol/L (ref 96–112)
GFR calc Af Amer: >90 mL/min (ref >90)
GLUCOSE: 121 mg/dL — AB (ref 70–99)
POTASSIUM: 3.8 mmol/L (ref 3.5–5.1)
SODIUM: 140 mmol/L (ref 135–145)

## 2014-08-11 LAB — CBC WITH DIFFERENTIAL/PLATELET
BASOS ABS: 0 10*3/uL (ref 0.0–0.1)
BASOS PCT: 0 % (ref 0–1)
EOS PCT: 2 % (ref 0–5)
Eosinophils Absolute: 0.2 10*3/uL (ref 0.0–0.7)
HEMATOCRIT: 34.4 % — AB (ref 36.0–46.0)
HEMOGLOBIN: 9.8 g/dL — AB (ref 12.0–15.0)
LYMPHS PCT: 16 % (ref 12–46)
Lymphs Abs: 1.9 10*3/uL (ref 0.7–4.0)
MCH: 19 pg — ABNORMAL LOW (ref 26.0–34.0)
MCHC: 28.5 g/dL — ABNORMAL LOW (ref 30.0–36.0)
MCV: 66.7 fL — ABNORMAL LOW (ref 78.0–100.0)
MONOS PCT: 5 % (ref 3–12)
Monocytes Absolute: 0.6 10*3/uL (ref 0.1–1.0)
NEUTROS ABS: 9.2 10*3/uL — AB (ref 1.7–7.7)
NEUTROS PCT: 77 % (ref 43–77)
Platelets: 256 10*3/uL (ref 150–400)
RBC: 5.16 MIL/uL — AB (ref 3.87–5.11)
RDW: 18.8 % — ABNORMAL HIGH (ref 11.5–15.5)
WBC: 11.9 10*3/uL — AB (ref 4.0–10.5)

## 2014-08-11 LAB — D-DIMER, QUANTITATIVE: D-Dimer, Quant: <0.27 ug/mL-FEU (ref 0.00–0.48)

## 2014-08-11 LAB — I-STAT TROPONIN, ED: Troponin i, poc: 0 ng/mL (ref 0.00–0.08)

## 2014-08-11 MED ORDER — FAMOTIDINE 20 MG PO TABS: 20.0000 mg | ORAL_TABLET | Freq: Two times a day (BID) | ORAL | Status: DC

## 2014-08-11 MED ORDER — ONDANSETRON HCL 4 MG/2ML IJ SOLN
4.0000 mg | Freq: Once | INTRAMUSCULAR | Status: AC
  Administered 2014-08-11: 4 mg via INTRAVENOUS
  Filled 2014-08-11: qty 2

## 2014-08-11 MED ORDER — SUCRALFATE 1 G PO TABS: 1.0000 g | ORAL_TABLET | Freq: Three times a day (TID) | ORAL | Status: DC

## 2014-08-11 MED ORDER — OMEPRAZOLE 20 MG PO CPDR: 20.0000 mg | DELAYED_RELEASE_CAPSULE | Freq: Every day | ORAL | Status: DC

## 2014-08-11 MED ORDER — GI COCKTAIL ~~LOC~~
30.0000 mL | Freq: Once | ORAL | Status: AC
  Administered 2014-08-11: 30 mL via ORAL
  Filled 2014-08-11: qty 30

## 2014-08-11 MED ORDER — HYDROCODONE-ACETAMINOPHEN 5-325 MG PO TABS: ORAL_TABLET | ORAL | Status: DC

## 2014-08-11 MED ORDER — MORPHINE SULFATE 4 MG/ML IJ SOLN
4.0000 mg | Freq: Once | INTRAMUSCULAR | Status: AC
  Administered 2014-08-11: 4 mg via INTRAVENOUS
  Filled 2014-08-11: qty 1

## 2014-08-11 MED ORDER — FAMOTIDINE 20 MG PO TABS
40.0000 mg | ORAL_TABLET | Freq: Once | ORAL | Status: AC
  Administered 2014-08-11: 40 mg via ORAL
  Filled 2014-08-11: qty 2

## 2014-08-11 MED ORDER — OXYCODONE-ACETAMINOPHEN 5-325 MG PO TABS
1.0000 | ORAL_TABLET | Freq: Once | ORAL | Status: AC
  Administered 2014-08-11: 1 via ORAL
  Filled 2014-08-11: qty 1

## 2014-08-11 NOTE — Discharge Instructions (Signed)
Please read and follow all provided instructions.  Your diagnoses today include:  1. Chest pain, unspecified chest pain type   2. Epigastric pain     Tests performed today include:  An EKG of your heart - normal  A chest x-ray - normal  Cardiac enzymes - a blood test for heart muscle damage, no sign of heart attack  Blood counts and electrolytes  Vital signs. See below for your results today.   Medications prescribed:   Vicodin (hydrocodone/acetaminophen) - narcotic pain medication  DO NOT drive or perform any activities that require you to be awake and alert because this medicine can make you drowsy. BE VERY CAREFUL not to take multiple medicines containing Tylenol (also called acetaminophen). Doing so can lead to an overdose which can damage your liver and cause liver failure and possibly death.   Pepcid (famotidine) - antihistamine  You can find this medication over-the-counter.   DO NOT exceed:   20mg  Pepcid every 12 hours   Omeprazole (Prilosec) - stomach acid reducer  This medication can be found over-the-counter   Carafate - for stomach upset and to protect your stomach  Take any prescribed medications only as directed.  Follow-up instructions: Please follow-up with your primary care provider as soon as you can for further evaluation of your symptoms.   Return instructions:  SEEK IMMEDIATE MEDICAL ATTENTION IF:  You have severe chest pain, especially if the pain is crushing or pressure-like and spreads to the arms, back, neck, or jaw, or if you have sweating, nausea (feeling sick to your stomach), or shortness of breath. THIS IS AN EMERGENCY. Don't wait to see if the pain will go away. Get medical help at once. Call 911 or 0 (operator). DO NOT drive yourself to the hospital.   Your chest pain gets worse and does not go away with rest.   You have an attack of chest pain lasting longer than usual, despite rest and treatment with the medications your  caregiver has prescribed.   You wake from sleep with chest pain or shortness of breath.  You feel dizzy or faint.  You have chest pain not typical of your usual pain for which you originally saw your caregiver.   You have any other emergent concerns regarding your health.  Return instructions:  SEEK IMMEDIATE MEDICAL ATTENTION IF:  The pain does not go away or becomes severe   A temperature above 101F develops   Repeated vomiting occurs (multiple episodes)   The pain becomes localized to portions of the abdomen. The right side could possibly be appendicitis. In an adult, the left lower portion of the abdomen could be colitis or diverticulitis.   Blood is being passed in stools or vomit (bright red or black tarry stools)   You develop chest pain, difficulty breathing, dizziness or fainting, or become confused, poorly responsive, or inconsolable (young children)  If you have any other emergent concerns regarding your health  Additional Information: Abdominal (belly) pain can be caused by many things. Your caregiver performed an examination and possibly ordered blood/urine tests and imaging (CT scan, x-rays, ultrasound). Many cases can be observed and treated at home after initial evaluation in the emergency department. Even though you are being discharged home, abdominal pain can be unpredictable. Therefore, you need a repeated exam if your pain does not resolve, returns, or worsens. Most patients with abdominal pain don't have to be admitted to the hospital or have surgery, but serious problems like appendicitis and gallbladder attacks can  start out as nonspecific pain. Many abdominal conditions cannot be diagnosed in one visit, so follow-up evaluations are very important.  Your vital signs today were: BP 115/52 mmHg   Pulse 83   Temp(Src) 98.3 F (36.8 C) (Oral)   Resp 21   Ht 5\' 6"  (1.676 m)   Wt 306 lb (138.801 kg)   BMI 49.41 kg/m2   SpO2 100%   LMP 07/24/2014 If your blood  pressure (bp) was elevated above 135/85 this visit, please have this repeated by your doctor within one month. --------------

## 2014-08-11 NOTE — ED Notes (Signed)
Pt c/o back pain starting last night that is now into chest and down both arms with some SOB

## 2014-08-11 NOTE — ED Provider Notes (Signed)
[name]: [id number]     Arrival date & time 08/11/14  1048 History   First MD Initiated Contact with Patient 08/11/14 1221     Chief Complaint  Patient presents with  . Chest Pain  . Back Pain     (Consider location/radiation/quality/duration/timing/severity/associated sxs/prior Treatment) HPI Comments: Patient with h/o obesity, lupus anticoagulant, gestational diabetes now resolved -- presents with complaint of back pain starting yesterday with radiation into her chest and down his arms with associated subjective shortness of breath this morning. Patient has been seen in emergency department for similar symptoms in the past, however states that this is different than anything she has felt before. No diaphoresis, palpitations, vomiting. Patient denies fever or cough. Patient does note that symptoms are worse with swallowing. Not changed with movement or palpation. Denies diarrhea, urinary sx. Symptoms began at rest and are not worse with exertion. The onset of this condition was acute. The course is constant. Alleviating factors: none.    ECHO 08/2013 showed normal EF with mild LVH.   Patient is a 34 y.o. female presenting with chest pain and back pain. The history is provided by the patient and medical records.  Chest Pain Associated symptoms: back pain and shortness of breath   Associated symptoms: no abdominal pain, no cough, no diaphoresis, no fever, no nausea, no palpitations and not vomiting   Back Pain Associated symptoms: chest pain   Associated symptoms: no abdominal pain, no dysuria and no fever     Past Medical History  Diagnosis Date  . Hernia   . H/O colonoscopy 2008    polyp removed  . Diabetes   . Dementia   . Sleep apnea   . Headache(784.0)     migraines  . Arthritis     left ankle  . Blood dyscrasia     lupus anticoagulant during pregnancy   Past Surgical History  Procedure Laterality Date  . Dilation and curettage of uterus  2004  . Foot surgery  2008  .  Ventral hernia repair  09/20/10    Laparoscopic, Dr Greer Pickerel  . Hernia repair  09/19/2011  . Wisdom tooth extraction    . Cesarean section with bilateral tubal ligation Bilateral 02/11/2013    Procedure: Repeat CESAREAN SECTION WITH BILATERAL TUBAL LIGATION;  Surgeon: Lovenia Kim, MD;  Location: [facility];  Service: Obstetrics;  Laterality: Bilateral;  EDD: 03/01/13   Family History  Problem Relation Age of Onset  . Diabetes Mother   . Hypertension Mother   . Diabetes Father   . Hypertension Father    History  Substance Use Topics  . Smoking status: Former Smoker -- 0.30 packs/day    Types: Cigarettes    Quit date: 05/11/2012  . Smokeless tobacco: Never Used  . Alcohol Use: Yes     Comment: "once every three years"    OB History    Gravida Para Term Preterm AB TAB SAB Ectopic Multiple Living   4 2 1  2     2      Review of Systems  Constitutional: Negative for fever and diaphoresis.  Eyes: Negative for redness.  Respiratory: Positive for shortness of breath. Negative for cough.   Cardiovascular: Positive for chest pain. Negative for palpitations and leg swelling.  Gastrointestinal: Negative for nausea, vomiting and abdominal pain.  Genitourinary: Negative for dysuria.  Musculoskeletal: Positive for back pain. Negative for neck pain.  Skin: Negative for rash.  Neurological: Negative for syncope and light-headedness.  Psychiatric/Behavioral: The patient is  not nervous/anxious.       Allergies  Review of patient's allergies indicates no known allergies.  Home Medications   Prior to Admission medications   Medication Sig Start Date End Date Taking? Authorizing Provider  albuterol (PROVENTIL HFA;VENTOLIN HFA) 108 (90 BASE) MCG/ACT inhaler Inhale 2 puffs into the lungs every 6 (six) hours as needed for wheezing or shortness of breath.    Historical Provider, MD  chlorpheniramine-HYDROcodone (TUSSIONEX PENNKINETIC ER) 10-8 MG/5ML LQCR Take 5 mLs by mouth at bedtime as  needed for cough. 12/19/13   Liam Graham, PA-C  Chlorpheniramine-PSE-Ibuprofen (ADVIL ALLERGY SINUS) 2-30-200 MG TABS 1-2 tabs PO Q4-6 hrs PRN 12/19/13   Liam Graham, PA-C  fluticasone (FLONASE) 50 MCG/ACT nasal spray Place 2 sprays into both nostrils 2 (two) times daily. Decrease to 2 sprays/nostril daily after 5 days 12/19/13   Liam Graham, PA-C  ibuprofen (ADVIL,MOTRIN) 200 MG tablet Take 200 mg by mouth every 8 (eight) hours as needed for moderate pain.    Historical Provider, MD  metFORMIN (GLUCOPHAGE) 500 MG tablet Take 1 tablet (500 mg total) by mouth 2 (two) times daily with a meal. 1 tablet with breakfast and 1 tablet with evening meal 02/14/13   Laury Deep, CNM  predniSONE (DELTASONE) 10 MG tablet 4 tabs PO QD for 4 days; 3 tabs PO QD for 3 days; 2 tabs PO QD for 2 days; 1 tab PO QD for 1 day 12/19/13   Liam Graham, PA-C  promethazine (PHENERGAN) 25 MG tablet Take 1 tablet (25 mg total) by mouth every 6 (six) hours as needed for nausea or vomiting. 09/02/13   Tiffany Carlota Raspberry, PA-C  ranitidine (ZANTAC) 150 MG tablet Take 150 mg by mouth 2 (two) times daily as needed. For acid reflux    Historical Provider, MD  traMADol (ULTRAM) 50 MG tablet Take 1 tablet (50 mg total) by mouth every 6 (six) hours as needed. 09/02/13   Tiffany Carlota Raspberry, PA-C   BP 132/89 mmHg  Pulse 98  Temp(Src) 98.3 F (36.8 C) (Oral)  Resp 17  Ht 5\' 6"  (1.676 m)  Wt 306 lb (138.801 kg)  BMI 49.41 kg/m2  SpO2 100%  LMP 07/24/2014   Physical Exam  Constitutional: She appears well-developed and well-nourished.  HENT:  Head: Normocephalic and atraumatic.  Mouth/Throat: Oropharynx is clear and moist and mucous membranes are normal. Mucous membranes are not dry.  Eyes: Conjunctivae are normal.  Neck: Trachea normal and normal range of motion. Neck supple. Normal carotid pulses and no JVD present. No muscular tenderness present. Carotid bruit is not present. No tracheal deviation present.  Cardiovascular:  Normal rate, regular rhythm, S1 normal, S2 normal, normal heart sounds and intact distal pulses.  Exam reveals no decreased pulses.   No murmur heard. Pulmonary/Chest: Effort normal. No respiratory distress. She has no wheezes. She exhibits no tenderness.  Abdominal: Soft. Normal aorta and bowel sounds are normal. There is no tenderness. There is no rebound and no guarding.  Obese  Musculoskeletal: Normal range of motion.  Neurological: She is alert.  Skin: Skin is warm and dry. She is not diaphoretic. No cyanosis. No pallor.  Psychiatric: She has a normal mood and affect.  Nursing note and vitals reviewed.   ED Course  Procedures (including critical care time) Labs Review Labs Reviewed  BASIC METABOLIC PANEL - Abnormal; Notable for the following:    Glucose, Bld 121 (*)    All other components within normal limits  CBC WITH DIFFERENTIAL/PLATELET -  Abnormal; Notable for the following:    WBC 11.9 (*)    RBC 5.16 (*)    Hemoglobin 9.8 (*)    HCT 34.4 (*)    MCV 66.7 (*)    MCH 19.0 (*)    MCHC 28.5 (*)    RDW 18.8 (*)    Neutro Abs 9.2 (*)    All other components within normal limits  D-DIMER, QUANTITATIVE  I-STAT TROPOININ, ED    Imaging Review Dg Chest 2 View  08/11/2014   CLINICAL DATA:  Chest pain and heaviness  EXAM: CHEST  2 VIEW  COMPARISON:  July 31, 2013  FINDINGS: There is mild scarring in the left base. Lungs elsewhere clear. Heart is slightly enlarged with pulmonary vascularity within normal limits. No adenopathy. No pneumothorax. No bone lesions.  IMPRESSION: Mild cardiac enlargement. Mild scarring left base. No edema or consolidation.   Electronically Signed   By: Lowella Grip III M.D.   On: 08/11/2014 12:53     EKG Interpretation None       ED ECG REPORT   Date: 08/11/2014  Rate: 94  Rhythm: normal sinus rhythm  QRS Axis: normal  Intervals: normal  ST/T Wave abnormalities: nonspecific ST/T changes  Conduction Disutrbances:none  Narrative  Interpretation:   Old EKG Reviewed: unchanged from 07/2013  I have personally reviewed the EKG tracing and agree with the computerized printout as noted.    12:44 PM Patient seen and examined. Work-up initiated. Medications ordered.   Vital signs reviewed and are as follows: BP 132/89 mmHg  Pulse 98  Temp(Src) 98.3 F (36.8 C) (Oral)  Resp 17  Ht 5\' 6"  (1.676 m)  Wt 306 lb (138.801 kg)  BMI 49.41 kg/m2  SpO2 100%  LMP 07/24/2014  3:29 PM D-dimer negative. Work-up unconcerning. Feel low risk for ACS. Pt informed of results. She feels somewhat better after treatment but is still having discomfort. She has saline lock. Will give IV pain medication and dishcharge if she is doing well.   Will d/c with vicodin, pepcid, omeprazole, and carafate. Pt has PCP, Dr. Lisbeth Ply. She agrees to follow-up with PCP within the next 48 hrs.   Patient was counseled to return with severe chest pain, especially if the pain is crushing or pressure-like and spreads to the arms, back, neck, or jaw, or if they have sweating, nausea, or shortness of breath with the pain. They were encouraged to call 911 with these symptoms.   They were also told to return if their chest pain gets worse and does not go away with rest, they have an attack of chest pain lasting longer than usual despite rest and treatment with the medications their caregiver has prescribed, if they wake from sleep with chest pain or shortness of breath, if they feel dizzy or faint, if they have chest pain not typical of their usual pain, or if they have any other emergent concerns regarding their health.  The patient verbalized understanding and agreed.   The patient was urged to return to the Emergency Department immediately with worsening of current symptoms, worsening abdominal pain, persistent vomiting, blood noted in stools, fever, or any other concerns. The patient verbalized understanding.    MDM   Final diagnoses:  Chest pain, unspecified  chest pain type  Epigastric pain   CP: Greater than 12 hrs duration, atypical, with unchanged EKG and normal troponin x 1. HEART=1. Low risk ACS, f/u with PCP in 2 days. D-dimer neg, do not suspect PE. CXR clear,  no signs PNA. Possible GI etiology (esophagitis, gastritis, PUD) given worsening with swallowing and epigastric pain. PPI/H2 blocker + carafate. Vicodin for short-term pain control until PCP f/u. Abd is otherwise soft. Doubt cholecystitis/lithiasis, pancreatitis.   No dangerous or life-threatening conditions suspected or identified by history, physical exam, and by work-up. No indications for hospitalization identified.     Carlisle Cater, PA-C 08/11/14 [place], MD 08/11/14 [phone]

## 2014-08-11 NOTE — ED Notes (Signed)
Pt is alert and orientated X 4.  She requested a wheelchair upon discharge.  Pain is decreasing. She rates it a 6. Pt requested note for work.

## 2014-08-13 ENCOUNTER — Other Ambulatory Visit: Payer: Self-pay | Admitting: Family Medicine

## 2014-08-13 DIAGNOSIS — R1011 Right upper quadrant pain: Secondary | ICD-10-CM

## 2014-08-14 ENCOUNTER — Ambulatory Visit
Admission: RE | Admit: 2014-08-14 | Discharge: 2014-08-14 | Disposition: A | Discharge status: Home / Self Care | Payer: BLUE CROSS/BLUE SHIELD | Source: Ambulatory Visit | Attending: Family Medicine | Admitting: Family Medicine

## 2014-08-14 DIAGNOSIS — R1011 Right upper quadrant pain: Secondary | ICD-10-CM

## 2014-08-26 ENCOUNTER — Other Ambulatory Visit (HOSPITAL_COMMUNITY): Payer: Self-pay | Admitting: Family Medicine

## 2014-08-26 DIAGNOSIS — R1011 Right upper quadrant pain: Secondary | ICD-10-CM

## 2014-09-05 ENCOUNTER — Ambulatory Visit (HOSPITAL_COMMUNITY)
Admission: RE | Admit: 2014-09-05 | Discharge: 2014-09-05 | Disposition: A | Discharge status: Home / Self Care | Payer: BLUE CROSS/BLUE SHIELD | Source: Ambulatory Visit | Attending: Family Medicine | Admitting: Family Medicine

## 2014-09-05 DIAGNOSIS — R110 Nausea: Secondary | ICD-10-CM | POA: Diagnosis not present

## 2014-09-05 DIAGNOSIS — R1011 Right upper quadrant pain: Secondary | ICD-10-CM | POA: Diagnosis not present

## 2014-09-05 MED ORDER — SINCALIDE 5 MCG IJ SOLR
INTRAMUSCULAR | Status: AC
  Filled 2014-09-05: qty 10

## 2014-09-05 MED ORDER — STERILE WATER FOR INJECTION IJ SOLN
INTRAMUSCULAR | Status: AC
  Filled 2014-09-05: qty 10

## 2014-09-05 MED ORDER — TECHNETIUM TC 99M MEBROFENIN IV KIT
5.0000 | PACK | Freq: Once | INTRAVENOUS | Status: AC | PRN
  Administered 2014-09-05: 5 via INTRAVENOUS

## 2014-09-05 MED ORDER — SINCALIDE 5 MCG IJ SOLR
0.0200 ug/kg | Freq: Once | INTRAMUSCULAR | Status: AC
  Administered 2014-09-05: 2.8 ug via INTRAVENOUS

## 2014-09-05 MED ORDER — SINCALIDE 5 MCG IJ SOLR
INTRAMUSCULAR | Status: AC
Start: 2014-09-05 — End: 2014-09-05
  Filled 2014-09-05: qty 10

## 2014-09-17 ENCOUNTER — Other Ambulatory Visit (HOSPITAL_COMMUNITY): Payer: BLUE CROSS/BLUE SHIELD

## 2014-09-17 ENCOUNTER — Ambulatory Visit (HOSPITAL_COMMUNITY): Payer: BLUE CROSS/BLUE SHIELD

## 2014-10-14 ENCOUNTER — Emergency Department (HOSPITAL_COMMUNITY): Payer: BLUE CROSS/BLUE SHIELD

## 2014-10-14 ENCOUNTER — Encounter (HOSPITAL_COMMUNITY): Payer: Self-pay | Admitting: Emergency Medicine

## 2014-10-14 ENCOUNTER — Emergency Department (HOSPITAL_COMMUNITY)
Admission: EM | Admit: 2014-10-14 | Discharge: 2014-10-14 | Disposition: A | Discharge status: Home / Self Care | Payer: BLUE CROSS/BLUE SHIELD | Attending: Emergency Medicine | Admitting: Emergency Medicine

## 2014-10-14 DIAGNOSIS — L0390 Cellulitis, unspecified: Secondary | ICD-10-CM | POA: Diagnosis not present

## 2014-10-14 DIAGNOSIS — Z79899 Other long term (current) drug therapy: Secondary | ICD-10-CM | POA: Insufficient documentation

## 2014-10-14 DIAGNOSIS — R112 Nausea with vomiting, unspecified: Secondary | ICD-10-CM | POA: Diagnosis not present

## 2014-10-14 DIAGNOSIS — Z3202 Encounter for pregnancy test, result negative: Secondary | ICD-10-CM | POA: Diagnosis not present

## 2014-10-14 DIAGNOSIS — Z87891 Personal history of nicotine dependence: Secondary | ICD-10-CM | POA: Diagnosis not present

## 2014-10-14 DIAGNOSIS — M1990 Unspecified osteoarthritis, unspecified site: Secondary | ICD-10-CM | POA: Diagnosis not present

## 2014-10-14 DIAGNOSIS — E119 Type 2 diabetes mellitus without complications: Secondary | ICD-10-CM | POA: Diagnosis not present

## 2014-10-14 DIAGNOSIS — Z9889 Other specified postprocedural states: Secondary | ICD-10-CM | POA: Insufficient documentation

## 2014-10-14 DIAGNOSIS — Z8719 Personal history of other diseases of the digestive system: Secondary | ICD-10-CM | POA: Diagnosis not present

## 2014-10-14 DIAGNOSIS — F0390 Unspecified dementia without behavioral disturbance: Secondary | ICD-10-CM | POA: Diagnosis not present

## 2014-10-14 DIAGNOSIS — Z862 Personal history of diseases of the blood and blood-forming organs and certain disorders involving the immune mechanism: Secondary | ICD-10-CM | POA: Insufficient documentation

## 2014-10-14 DIAGNOSIS — R1033 Periumbilical pain: Secondary | ICD-10-CM | POA: Insufficient documentation

## 2014-10-14 DIAGNOSIS — R509 Fever, unspecified: Secondary | ICD-10-CM | POA: Insufficient documentation

## 2014-10-14 DIAGNOSIS — Z8669 Personal history of other diseases of the nervous system and sense organs: Secondary | ICD-10-CM | POA: Diagnosis not present

## 2014-10-14 DIAGNOSIS — R1031 Right lower quadrant pain: Secondary | ICD-10-CM | POA: Diagnosis present

## 2014-10-14 LAB — CBC WITH DIFFERENTIAL/PLATELET
Basophils Absolute: 0 10*3/uL (ref 0.0–0.1)
Basophils Relative: 0 % (ref 0–1)
Eosinophils Absolute: 0.1 10*3/uL (ref 0.0–0.7)
Eosinophils Relative: 1 % (ref 0–5)
HCT: 32.4 % — ABNORMAL LOW (ref 36.0–46.0)
Hemoglobin: 9.7 g/dL — ABNORMAL LOW (ref 12.0–15.0)
Lymphocytes Relative: 14 % (ref 12–46)
Lymphs Abs: 1 10*3/uL (ref 0.7–4.0)
MCH: 19.3 pg — ABNORMAL LOW (ref 26.0–34.0)
MCHC: 29.9 g/dL — ABNORMAL LOW (ref 30.0–36.0)
MCV: 64.5 fL — ABNORMAL LOW (ref 78.0–100.0)
Monocytes Absolute: 0.6 10*3/uL (ref 0.1–1.0)
Monocytes Relative: 8 % (ref 3–12)
Neutro Abs: 5.5 10*3/uL (ref 1.7–7.7)
Neutrophils Relative %: 77 % (ref 43–77)
Platelets: 194 10*3/uL (ref 150–400)
RBC: 5.02 MIL/uL (ref 3.87–5.11)
RDW: 18.6 % — ABNORMAL HIGH (ref 11.5–15.5)
WBC: 7.2 10*3/uL (ref 4.0–10.5)

## 2014-10-14 LAB — BASIC METABOLIC PANEL
Anion gap: 8 (ref 5–15)
BUN: 11 mg/dL (ref 6–20)
CO2: 23 mmol/L (ref 22–32)
Calcium: 7.9 mg/dL — ABNORMAL LOW (ref 8.9–10.3)
Chloride: 106 mmol/L (ref 101–111)
Creatinine, Ser: 0.57 mg/dL (ref 0.44–1.00)
GFR calc Af Amer: >60 mL/min (ref >60)
GFR calc non Af Amer: >60 mL/min (ref >60)
Glucose, Bld: 93 mg/dL (ref 65–99)
Potassium: 3.6 mmol/L (ref 3.5–5.1)
Sodium: 137 mmol/L (ref 135–145)

## 2014-10-14 LAB — URINALYSIS, ROUTINE W REFLEX MICROSCOPIC
Bilirubin Urine: NEGATIVE
Glucose, UA: NEGATIVE mg/dL
Hgb urine dipstick: NEGATIVE
Ketones, ur: 15 mg/dL — AB
Leukocytes, UA: NEGATIVE
Nitrite: NEGATIVE
Protein, ur: NEGATIVE mg/dL
Specific Gravity, Urine: 1.027 (ref 1.005–1.030)
Urobilinogen, UA: 1 mg/dL (ref 0.0–1.0)
pH: 6 (ref 5.0–8.0)

## 2014-10-14 LAB — POC URINE PREG, ED: Preg Test, Ur: NEGATIVE

## 2014-10-14 LAB — LIPASE, BLOOD: Lipase: 16 U/L — ABNORMAL LOW (ref 22–51)

## 2014-10-14 MED ORDER — CEPHALEXIN 250 MG PO CAPS
500.0000 mg | ORAL_CAPSULE | Freq: Once | ORAL | Status: AC
  Administered 2014-10-14: 500 mg via ORAL
  Filled 2014-10-14: qty 2

## 2014-10-14 MED ORDER — ONDANSETRON HCL 4 MG PO TABS: 4.0000 mg | ORAL_TABLET | Freq: Four times a day (QID) | ORAL | Status: DC

## 2014-10-14 MED ORDER — CEPHALEXIN 500 MG PO CAPS: 500.0000 mg | ORAL_CAPSULE | Freq: Four times a day (QID) | ORAL | Status: DC

## 2014-10-14 MED ORDER — IOHEXOL 300 MG/ML  SOLN
25.0000 mL | Freq: Once | INTRAMUSCULAR | Status: AC | PRN
  Administered 2014-10-14: 25 mL via ORAL

## 2014-10-14 MED ORDER — OXYCODONE-ACETAMINOPHEN 5-325 MG PO TABS: 2.0000 | ORAL_TABLET | ORAL | Status: DC | PRN

## 2014-10-14 MED ORDER — IOHEXOL 300 MG/ML  SOLN
100.0000 mL | Freq: Once | INTRAMUSCULAR | Status: AC | PRN
  Administered 2014-10-14: 100 mL via INTRAVENOUS

## 2014-10-14 MED ORDER — KETOROLAC TROMETHAMINE 30 MG/ML IJ SOLN
30.0000 mg | Freq: Once | INTRAMUSCULAR | Status: AC
  Administered 2014-10-14: 30 mg via INTRAVENOUS
  Filled 2014-10-14: qty 1

## 2014-10-14 MED ORDER — ONDANSETRON HCL 4 MG/2ML IJ SOLN
4.0000 mg | Freq: Once | INTRAMUSCULAR | Status: AC
  Administered 2014-10-14: 4 mg via INTRAVENOUS
  Filled 2014-10-14: qty 2

## 2014-10-14 NOTE — ED Notes (Signed)
Pt. Stated, i started having abdominal pain with  with chills and fever.

## 2014-10-14 NOTE — Discharge Instructions (Signed)
Cellulitis Cellulitis is an infection of the skin and the tissue under the skin. The infected area is usually red and tender. This happens most often in the arms and lower legs. HOME CARE   Take your antibiotic medicine as told. Finish the medicine even if you start to feel better.  Keep the infected arm or leg raised (elevated).  Put a warm cloth on the area up to 4 times per day.  Only take medicines as told by your doctor.  Keep all doctor visits as told. GET HELP IF:  You see red streaks on the skin coming from the infected area.  Your red area gets bigger or turns a dark color.  Your bone or joint under the infected area is painful after the skin heals.  Your infection comes back in the same area or different area.  You have a puffy (swollen) bump in the infected area.  You have new symptoms.  You have a fever. GET HELP RIGHT AWAY IF:   You feel very sleepy.  You throw up (vomit) or have watery poop (diarrhea).  You feel sick and have muscle aches and pains. MAKE SURE YOU:   Understand these instructions.  Will watch your condition.  Will get help right away if you are not doing well or get worse. Document Released: 10/05/2007 Document Revised: 09/02/2013 Document Reviewed: 07/04/2011 [facility] Patient Information 2015 [place], [place]. This information is not intended to replace advice given to you by your health care provider. Make sure you discuss any questions you have with your health care provider.  Please read attached information, take antibiotics as directed. Please return immediately to the emergency room if new or worsening signs or symptoms present. Please follow-up with surgeon first thing tomorrow morning to verify her appointment date and time. Please medication only as directed, do not drink drive or operate heavy machinery while taking.

## 2014-10-14 NOTE — ED Provider Notes (Signed)
[name]: [id number]     Arrival date & time 10/14/14  0744 History   First MD Initiated Contact with Patient 10/14/14 0756     Chief Complaint  Patient presents with  . Abdominal Pain  . Nausea   HPI   34 YOF with a history of ventral hernia repair presents with acute abdominal pain. Patient reports the pain started approximately 2 days ago, with associated nausea and vomiting. She reports subjective fevers, and right lower quadrant pain. Patient reports she's had normal bowel movements last one yesterday, denies headache, dizziness, chest pain, diarrhea, skin infections, or any other concerning signs or symptoms. Ventral repair 2012, C-sections and 2011 and 14. Patient reports her last by mouth intake was yesterday morning, 6 episodes of vomiting yesterday. Patient has not tried pain medication at home.   Past Medical History  Diagnosis Date  . Hernia   . H/O colonoscopy 2008    polyp removed  . Diabetes   . Dementia   . Sleep apnea   . Headache(784.0)     migraines  . Arthritis     left ankle  . Blood dyscrasia     lupus anticoagulant during pregnancy   Past Surgical History  Procedure Laterality Date  . Dilation and curettage of uterus  2004  . Foot surgery  2008  . Ventral hernia repair  09/20/10    Laparoscopic, Dr Greer Pickerel  . Hernia repair  09/19/2011  . Wisdom tooth extraction    . Cesarean section with bilateral tubal ligation Bilateral 02/11/2013    Procedure: Repeat CESAREAN SECTION WITH BILATERAL TUBAL LIGATION;  Surgeon: Lovenia Kim, MD;  Location: [facility];  Service: Obstetrics;  Laterality: Bilateral;  EDD: 03/01/13   Family History  Problem Relation Age of Onset  . Diabetes Mother   . Hypertension Mother   . Diabetes Father   . Hypertension Father    History  Substance Use Topics  . Smoking status: Former Smoker -- 0.30 packs/day    Types: Cigarettes    Quit date: 05/11/2012  . Smokeless tobacco: Never Used  . Alcohol Use: Yes     Comment: "once  every three years"    OB History    Gravida Para Term Preterm AB TAB SAB Ectopic Multiple Living   4 2 1  2     2      Review of Systems  All other systems reviewed and are negative.     Allergies  Review of patient's allergies indicates no known allergies.  Home Medications   Prior to Admission medications   Medication Sig Start Date End Date Taking? Authorizing Provider  acetaminophen (TYLENOL) 500 MG tablet Take 1,000 mg by mouth daily as needed for headache.   Yes Historical Provider, MD  DULoxetine (CYMBALTA) 60 MG capsule Take 60 mg by mouth daily.   Yes Historical Provider, MD  furosemide (LASIX) 20 MG tablet Take 20 mg by mouth daily.   Yes Historical Provider, MD  HYDROcodone-acetaminophen (NORCO/VICODIN) 5-325 MG per tablet Take 1-2 tablets every 6 hours as needed for severe pain 08/11/14  Yes Carlisle Cater, PA-C  famotidine (PEPCID) 20 MG tablet Take 1 tablet (20 mg total) by mouth 2 (two) times daily. Patient not taking: Reported on 10/14/2014 08/11/14   Carlisle Cater, PA-C  omeprazole (PRILOSEC) 20 MG capsule Take 1 capsule (20 mg total) by mouth daily. Patient not taking: Reported on 10/14/2014 08/11/14   Carlisle Cater, PA-C  sucralfate (CARAFATE) 1 G tablet Take 1 tablet (1  g total) by mouth 4 (four) times daily -  with meals and at bedtime. Patient not taking: Reported on 10/14/2014 08/11/14   Carlisle Cater, PA-C   BP 129/46 mmHg  Pulse 83  Temp(Src) 97.9 F (36.6 C) (Oral)  Resp 18  Ht 5\' 6"  (1.676 m)  Wt 304 lb 7 oz (138.092 kg)  BMI 49.16 kg/m2  SpO2 98%  LMP 09/26/2014 Physical Exam  Constitutional: She is oriented to person, place, and time. She appears well-developed and well-nourished.  Morbidly obese  HENT:  Head: Normocephalic and atraumatic.  Eyes: Pupils are equal, round, and reactive to light.  Neck: Normal range of motion. Neck supple. No JVD present. No tracheal deviation present. No thyromegaly present.  Cardiovascular: Normal rate, regular  rhythm, normal heart sounds and intact distal pulses.  Exam reveals no gallop and no friction rub.   No murmur heard. Pulmonary/Chest: Effort normal and breath sounds normal. No stridor. No respiratory distress. She has no wheezes. She has no rales. She exhibits no tenderness.  Abdominal: Soft. Bowel sounds are normal. She exhibits no distension and no mass. There is tenderness in the right lower quadrant and periumbilical area. There is no rebound and no guarding.  No obvious signs of trauma to the abdomen, no signs of superficial cellulitis, no signs of bruising. Difficult abdominal exam due to patient body habitus  Musculoskeletal: Normal range of motion.  Lymphadenopathy:    She has no cervical adenopathy.  Neurological: She is alert and oriented to person, place, and time. Coordination normal.  Skin: Skin is warm and dry.  Psychiatric: She has a normal mood and affect. Her behavior is normal. Judgment and thought content normal.  Nursing note and vitals reviewed.   ED Course  Procedures (including critical care time) Labs Review Labs Reviewed  CBC WITH DIFFERENTIAL/PLATELET - Abnormal; Notable for the following:    Hemoglobin 9.7 (*)    HCT 32.4 (*)    MCV 64.5 (*)    MCH 19.3 (*)    MCHC 29.9 (*)    RDW 18.6 (*)    All other components within normal limits  BASIC METABOLIC PANEL - Abnormal; Notable for the following:    Calcium 7.9 (*)    All other components within normal limits  LIPASE, BLOOD - Abnormal; Notable for the following:    Lipase 16 (*)    All other components within normal limits  URINALYSIS, ROUTINE W REFLEX MICROSCOPIC (NOT AT [facility]) - Abnormal; Notable for the following:    Ketones, ur 15 (*)    All other components within normal limits  POC URINE PREG, ED    Imaging Review Ct Abdomen Pelvis W Contrast  10/14/2014   CLINICAL DATA:  34 year old female with abdominal pain fever and chills for 2 days. Right lower quadrant pain with nausea. Initial encounter.   EXAM: CT ABDOMEN AND PELVIS WITH CONTRAST  TECHNIQUE: Multidetector CT imaging of the abdomen and pelvis was performed using the standard protocol following bolus administration of intravenous contrast.  CONTRAST:  131mL OMNIPAQUE IOHEXOL 300 MG/ML  SOLN  COMPARISON:  Hepato biliary scan 09/05/2014. CT Abdomen and Pelvis 05/18/2012.  FINDINGS: Small 4 mm lung nodules on images 1 and 2 in the right lower lobe appear stable. Stable cardiomegaly. No pericardial or pleural effusion. Small fat containing right Bochdalek hernia re- identified.  No acute osseous abnormality identified.  Negative non contrast uterus and adnexa. No pelvic free fluid. Negative distal colon. Unremarkable bladder.  Negative sigmoid colon aside from  retained stool. Redundant but otherwise negative splenic flexure. The cecum is located on a lax mesentery in the midline in the umbilical region. The appendix is normal best seen on series 2, image 69, located in the midline. Negative terminal ileum.  No dilated small bowel loops. Oral contrast in some of the small bowel and in the stomach. The duodenum C loop is not normally rotated. Most of the small bowel is in the right abdomen. No inflamed small bowel is identified. No abdominal free air or free fluid.  Negative liver, gallbladder, spleen, pancreas and adrenal glands. 2 mm left nephrolithiasis. No hydronephrosis or perinephric stranding. Suboptimal intravascular contrast.  Chronic heterogeneity and nodularity around the emboli kiss is associated with increased ventral abdominal wall subcutaneous stranding today (series 2, image 63). No subcutaneous gas or fluid collection. There appears to be a superimposed small chronic supraumbilical fat containing hernia which has been present since 2012. Chronic rectus muscle diastases. No lymphadenopathy.  IMPRESSION: 1. Subcutaneous fat stranding about the chronic VAC containing hernia located just above the umbilicus. Query acute cellulitis. No fluid  collection. No bowel containing hernia. 2. 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 Normal appendix.   Electronically Signed   By: Genevie Ann M.D.   On: 10/14/2014 10:20     EKG Interpretation None      MDM   Final diagnoses:  Cellulitis, unspecified cellulitis site, unspecified extremity site, unspecified laterality    Labs: Urine prior, urinalysis, CBC, BMP, lipase- to admit him for hemoglobin 9.7- consistent with previous ED visits, well known to patient.  Imaging: CT abdomen and pelvis- subcutaneous fat stranding about the chronic fat containing hernia, likely acute cellulitis. No signs of abscess or fluid collection.  Consults: Surgery  Therapeutics: Toradol, Zofran  Assessment: Cellulitis  Plan: Patient presents with likely cellulitis. Originally she had a temperature of 100.3 was given Toradol which resolved fever 97.9. Vital signs remained stable, no acute concern for sepsis at this time; Q sofa 0. Patient was treated with Toradol here in the ED for pain and fever relief, she refused any additional pain medication. Surgery was consult at who evaluated all relevant data, instructed [place] to begin oral Keflex, they schedule follow-up appointment for the patient to be seen by Dr. Redmond Pulling. Instructed patient to contact office first thing tomorrow morning to verify his appointment time and date. Patient given strict return precautions the event new worsening signs or symptoms presented, she'll be discharged home with oral pain medication, antinausea medication. Patient verbalizes her understanding and agreement, assured her follow-up evaluation if necessary, assured that she would contact surgery tomorrow morning. No further questions, concerns at time of discharge.      Okey Regal, PA-C 10/17/14 3212  Sherwood Gambler, MD 10/18/14 [phone]

## 2014-10-14 NOTE — ED Notes (Signed)
Pt. Given sprite for fluid challenge

## 2014-10-20 HISTORY — PX: ENDOMETRIAL ABLATION: SHX621

## 2014-11-18 ENCOUNTER — Encounter (HOSPITAL_COMMUNITY): Payer: Self-pay

## 2014-11-18 ENCOUNTER — Encounter (HOSPITAL_COMMUNITY)
Admission: RE | Admit: 2014-11-18 | Discharge: 2014-11-18 | Disposition: A | Discharge status: Home / Self Care | Payer: BLUE CROSS/BLUE SHIELD | Source: Ambulatory Visit | Attending: Obstetrics and Gynecology | Admitting: Obstetrics and Gynecology

## 2014-11-18 ENCOUNTER — Other Ambulatory Visit: Payer: Self-pay | Admitting: Obstetrics and Gynecology

## 2014-11-18 DIAGNOSIS — N920 Excessive and frequent menstruation with regular cycle: Secondary | ICD-10-CM | POA: Diagnosis not present

## 2014-11-18 DIAGNOSIS — Z6841 Body Mass Index (BMI) 40.0 and over, adult: Secondary | ICD-10-CM | POA: Diagnosis not present

## 2014-11-18 DIAGNOSIS — F419 Anxiety disorder, unspecified: Secondary | ICD-10-CM | POA: Diagnosis not present

## 2014-11-18 DIAGNOSIS — M1990 Unspecified osteoarthritis, unspecified site: Secondary | ICD-10-CM | POA: Diagnosis not present

## 2014-11-18 DIAGNOSIS — G4730 Sleep apnea, unspecified: Secondary | ICD-10-CM | POA: Diagnosis not present

## 2014-11-18 DIAGNOSIS — F329 Major depressive disorder, single episode, unspecified: Secondary | ICD-10-CM | POA: Diagnosis not present

## 2014-11-18 DIAGNOSIS — G43909 Migraine, unspecified, not intractable, without status migrainosus: Secondary | ICD-10-CM | POA: Diagnosis not present

## 2014-11-18 DIAGNOSIS — D6861 Antiphospholipid syndrome: Secondary | ICD-10-CM | POA: Diagnosis not present

## 2014-11-18 DIAGNOSIS — Z79899 Other long term (current) drug therapy: Secondary | ICD-10-CM | POA: Diagnosis not present

## 2014-11-18 DIAGNOSIS — Z87891 Personal history of nicotine dependence: Secondary | ICD-10-CM | POA: Diagnosis not present

## 2014-11-18 DIAGNOSIS — D759 Disease of blood and blood-forming organs, unspecified: Secondary | ICD-10-CM | POA: Diagnosis not present

## 2014-11-18 HISTORY — DX: Major depressive disorder, single episode, unspecified: F32.9

## 2014-11-18 HISTORY — DX: Anxiety disorder, unspecified: F41.9

## 2014-11-18 HISTORY — DX: Depression, unspecified: F32.A

## 2014-11-18 HISTORY — DX: Anemia, unspecified: D64.9

## 2014-11-18 LAB — CBC
HCT: 35.6 % — ABNORMAL LOW (ref 36.0–46.0)
Hemoglobin: 10.3 g/dL — ABNORMAL LOW (ref 12.0–15.0)
MCH: 19.1 pg — AB (ref 26.0–34.0)
MCHC: 28.9 g/dL — AB (ref 30.0–36.0)
MCV: 66 fL — AB (ref 78.0–100.0)
PLATELETS: 254 10*3/uL (ref 150–400)
RBC: 5.39 MIL/uL — AB (ref 3.87–5.11)
RDW: 19.3 % — ABNORMAL HIGH (ref 11.5–15.5)
WBC: 14 10*3/uL — ABNORMAL HIGH (ref 4.0–10.5)

## 2014-11-18 LAB — BASIC METABOLIC PANEL
ANION GAP: 5 (ref 5–15)
BUN: 16 mg/dL (ref 6–20)
CO2: 25 mmol/L (ref 22–32)
Calcium: 8.7 mg/dL — ABNORMAL LOW (ref 8.9–10.3)
Chloride: 108 mmol/L (ref 101–111)
Creatinine, Ser: 0.63 mg/dL (ref 0.44–1.00)
GFR calc non Af Amer: >60 mL/min (ref >60)
Glucose, Bld: 112 mg/dL — ABNORMAL HIGH (ref 65–99)
POTASSIUM: 4.1 mmol/L (ref 3.5–5.1)
SODIUM: 138 mmol/L (ref 135–145)

## 2014-11-18 MED ORDER — DEXTROSE 5 % IV SOLN
3.0000 g | INTRAVENOUS | Status: AC
  Administered 2014-11-19: 3 g via INTRAVENOUS
  Filled 2014-11-18: qty 3000

## 2014-11-18 NOTE — H&P (Signed)
Kathleen Walsh, ALBERTS NO.:  [id number]  MEDICAL RECORD NO.:  [id number]  LOCATION:  [facility]                         FACILITY:  [facility]  PHYSICIAN:  Lovenia Kim, M.D.DATE OF BIRTH:  [birth year]  DATE OF ADMISSION:  11/06/2014 DATE OF DISCHARGE:                             HISTORY & PHYSICAL   PREOPERATIVE DIAGNOSIS:  Refractory menorrhagia with a history of hypercoagulability for definitive therapy.  HISTORY OF PRESENT ILLNESS:  A 34 year old white female, G2, P2, with history of C-section x2 with tubal ligation, who presents with refractory menorrhagia and contraindication to hormonal therapy.  ALLERGIES:  She has no known drug allergies.  MEDICATIONS:  Vicodin as needed, Prozac, Lasix, Zofran, Keflex, and Zantac.  SOCIAL HISTORY:  She is a nonsmoker, nondrinker.  She denies domestic or physical violence.  PAST SURGICAL HISTORY:  History C-section x2 and SAB x2.  Surgical history C-section x2, as noted.  Tubal ligation.  Hernia repair.  Foot surgery.  D and C, and oral surgery.  FAMILY HISTORY:  Diabetes, breast cancer, hypertension, and glaucoma.  PERSONAL HISTORY:  Migraine headaches, depression, menorrhagia, and antiphospholipid antibody syndrome.  PHYSICAL EXAMINATION:  GENERAL:  She is a well-developed, well-nourished white female, in no acute distress. HEENT:  Normal. NECK:  Supple.  Full range of motion. LUNGS:  Clear. HEART:  Regular rate and rhythm. ABDOMEN:  Soft, nontender. PELVIC:  Reveals a bulky anteflexed uterus and no adnexal masses. EXTREMITIES:  There are no cords. NEUROLOGIC:  Nonfocal. SKIN:  Intact.  IMPRESSION:  Refractory menorrhagia for definitive therapy.  PLAN:  Proceed with diagnostic hysteroscopy, D and C, NovaSure endometrial ablation, risks of anesthesia, infection, bleeding, injury to surrounding organs with possible need for repair was discussed. Delayed versus immediate complications to include bowel and  bladder injury noted.  The patient acknowledges and wishes to proceed.     Lovenia Kim, M.D.     RJT/MEDQ  D:  11/18/2014  T:  11/18/2014  Job:  [id number]

## 2014-11-18 NOTE — Patient Instructions (Addendum)
Your procedure is scheduled on: November 19, 2014  Enter through the Main Entrance of [facility] at: 7:00 am   Pick up the phone at the desk and dial [phone].  Call this number if you have problems the morning of surgery: [phone].  Remember: Do NOT eat food: after midnight tonight  Do NOT drink clear liquids after:  After midnight tonight  Take these medicines the morning of surgery with a SIP OF WATER:  Lasix and cymbalta   Do NOT wear jewelry (body piercing), metal hair clips/bobby pins, make-up, or nail polish. Do NOT wear lotions, powders, or perfumes.  You may wear deoderant. Do NOT shave for 48 hours prior to surgery. Do NOT bring valuables to the hospital. Contacts, dentures, or bridgework may not be worn into surgery. Have a responsible adult drive you home and stay with you for 24 hours after your procedure

## 2014-11-18 NOTE — Anesthesia Preprocedure Evaluation (Addendum)
Anesthesia Evaluation  Patient identified by MRN, date of birth, ID band Patient awake    Reviewed: Allergy & Precautions, NPO status , Patient's Chart, lab work & pertinent test results  Airway Mallampati: II  TM Distance: >3 FB Neck ROM: Full    Dental  (+) Poor Dentition,    Pulmonary sleep apnea , former smoker,    Pulmonary exam normal       Cardiovascular negative cardio ROS Normal cardiovascular exam    Neuro/Psych  Headaches, PSYCHIATRIC DISORDERS Anxiety Depression  Neuromuscular disease    GI/Hepatic negative GI ROS, Neg liver ROS,   Endo/Other  diabetes (gestational)Morbid obesity  Renal/GU negative Renal ROS  negative genitourinary   Musculoskeletal  (+) Arthritis -,   Abdominal   Peds negative pediatric ROS (+)  Hematology  (+) Blood dyscrasia (lupus anticoagulant during pregnancy), anemia ,   Anesthesia Other Findings NPO appropriate, allergies reviewed Denies active cardiac or pulmonary symptoms, METS > 4 No recent congestive cough or symptoms of upper respiratory infection Meds - lasix and cymbalta 2015 Echo reviewed - EF normal, LVH and pericardial fatty infiltration - only required anticoagulation for lupus anticoagulant disease during pregnancy, no issues since, + smoking as recently as yesterday, no diabetes   Reproductive/Obstetrics negative OB ROS                          Anesthesia Physical Anesthesia Plan  ASA: III  Anesthesia Plan: General   Post-op Pain Management:    Induction: Intravenous  Airway Management Planned: LMA  Additional Equipment:   Intra-op Plan:   Post-operative Plan:   Informed Consent: I have reviewed the patients History and Physical, chart, labs and discussed the procedure including the risks, benefits and alternatives for the proposed anesthesia with the patient or authorized representative who has indicated his/her understanding  and acceptance.     Plan Discussed with: Anesthesiologist and CRNA  Anesthesia Plan Comments:        Anesthesia Quick Evaluation

## 2014-11-19 ENCOUNTER — Ambulatory Visit (HOSPITAL_COMMUNITY): Payer: BLUE CROSS/BLUE SHIELD | Admitting: Anesthesiology

## 2014-11-19 ENCOUNTER — Encounter (HOSPITAL_COMMUNITY)
Admission: RE | Disposition: A | Discharge status: Home / Self Care | Payer: Self-pay | Source: Ambulatory Visit | Attending: Obstetrics and Gynecology

## 2014-11-19 ENCOUNTER — Ambulatory Visit (HOSPITAL_COMMUNITY)
Admission: RE | Admit: 2014-11-19 | Discharge: 2014-11-19 | Disposition: A | Discharge status: Home / Self Care | Payer: BLUE CROSS/BLUE SHIELD | Source: Ambulatory Visit | Attending: Obstetrics and Gynecology | Admitting: Obstetrics and Gynecology

## 2014-11-19 DIAGNOSIS — G4730 Sleep apnea, unspecified: Secondary | ICD-10-CM | POA: Insufficient documentation

## 2014-11-19 DIAGNOSIS — Z6841 Body Mass Index (BMI) 40.0 and over, adult: Secondary | ICD-10-CM | POA: Insufficient documentation

## 2014-11-19 DIAGNOSIS — D759 Disease of blood and blood-forming organs, unspecified: Secondary | ICD-10-CM | POA: Insufficient documentation

## 2014-11-19 DIAGNOSIS — N920 Excessive and frequent menstruation with regular cycle: Secondary | ICD-10-CM | POA: Insufficient documentation

## 2014-11-19 DIAGNOSIS — D6861 Antiphospholipid syndrome: Secondary | ICD-10-CM | POA: Insufficient documentation

## 2014-11-19 DIAGNOSIS — G43909 Migraine, unspecified, not intractable, without status migrainosus: Secondary | ICD-10-CM | POA: Insufficient documentation

## 2014-11-19 DIAGNOSIS — F329 Major depressive disorder, single episode, unspecified: Secondary | ICD-10-CM | POA: Insufficient documentation

## 2014-11-19 DIAGNOSIS — Z79899 Other long term (current) drug therapy: Secondary | ICD-10-CM | POA: Insufficient documentation

## 2014-11-19 DIAGNOSIS — Z87891 Personal history of nicotine dependence: Secondary | ICD-10-CM | POA: Insufficient documentation

## 2014-11-19 DIAGNOSIS — F419 Anxiety disorder, unspecified: Secondary | ICD-10-CM | POA: Insufficient documentation

## 2014-11-19 DIAGNOSIS — M1990 Unspecified osteoarthritis, unspecified site: Secondary | ICD-10-CM | POA: Insufficient documentation

## 2014-11-19 HISTORY — PX: DILITATION & CURRETTAGE/HYSTROSCOPY WITH NOVASURE ABLATION: SHX5568

## 2014-11-19 LAB — BASIC METABOLIC PANEL
Anion gap: 4 — ABNORMAL LOW (ref 5–15)
BUN: 14 mg/dL (ref 6–20)
CALCIUM: 8.4 mg/dL — AB (ref 8.9–10.3)
CHLORIDE: 109 mmol/L (ref 101–111)
CO2: 24 mmol/L (ref 22–32)
Creatinine, Ser: 0.57 mg/dL (ref 0.44–1.00)
GFR calc Af Amer: >60 mL/min (ref >60)
GFR calc non Af Amer: >60 mL/min (ref >60)
Glucose, Bld: 103 mg/dL — ABNORMAL HIGH (ref 65–99)
Potassium: 3.8 mmol/L (ref 3.5–5.1)
SODIUM: 137 mmol/L (ref 135–145)

## 2014-11-19 LAB — HCG, SERUM, QUALITATIVE: Preg, Serum: NEGATIVE

## 2014-11-19 LAB — GLUCOSE, CAPILLARY: Glucose-Capillary: 104 mg/dL — ABNORMAL HIGH (ref 65–99)

## 2014-11-19 SURGERY — DILATATION & CURETTAGE/HYSTEROSCOPY WITH NOVASURE ABLATION
Anesthesia: General | Site: Vagina

## 2014-11-19 MED ORDER — PROMETHAZINE HCL 25 MG/ML IJ SOLN: 6.2500 mg | INTRAMUSCULAR | Status: DC | PRN

## 2014-11-19 MED ORDER — ONDANSETRON HCL 4 MG/2ML IJ SOLN
INTRAMUSCULAR | Status: AC
  Filled 2014-11-19: qty 2

## 2014-11-19 MED ORDER — BUPIVACAINE HCL (PF) 0.25 % IJ SOLN
INTRAMUSCULAR | Status: DC | PRN
  Administered 2014-11-19: 20 mL

## 2014-11-19 MED ORDER — PHENYLEPHRINE HCL 10 MG/ML IJ SOLN
INTRAMUSCULAR | Status: DC | PRN
  Administered 2014-11-19 (×2): 80 ug via INTRAVENOUS

## 2014-11-19 MED ORDER — HYDROCODONE-IBUPROFEN 7.5-200 MG PO TABS: 1.0000 | ORAL_TABLET | Freq: Three times a day (TID) | ORAL | Status: DC | PRN

## 2014-11-19 MED ORDER — MIDAZOLAM HCL 2 MG/2ML IJ SOLN
INTRAMUSCULAR | Status: DC | PRN
  Administered 2014-11-19: 2 mg via INTRAVENOUS

## 2014-11-19 MED ORDER — SCOPOLAMINE 1 MG/3DAYS TD PT72
MEDICATED_PATCH | TRANSDERMAL | Status: AC
  Administered 2014-11-19: 1.5 mg via TRANSDERMAL
  Filled 2014-11-19: qty 1

## 2014-11-19 MED ORDER — SODIUM CHLORIDE 0.9 % IJ SOLN
INTRAMUSCULAR | Status: AC
  Filled 2014-11-19: qty 50

## 2014-11-19 MED ORDER — DEXAMETHASONE SODIUM PHOSPHATE 10 MG/ML IJ SOLN
INTRAMUSCULAR | Status: DC | PRN
  Administered 2014-11-19: 4 mg via INTRAVENOUS

## 2014-11-19 MED ORDER — LIDOCAINE HCL (CARDIAC) 20 MG/ML IV SOLN
INTRAVENOUS | Status: DC | PRN
  Administered 2014-11-19: 100 mg via INTRAVENOUS

## 2014-11-19 MED ORDER — PHENYLEPHRINE 40 MCG/ML (10ML) SYRINGE FOR IV PUSH (FOR BLOOD PRESSURE SUPPORT)
PREFILLED_SYRINGE | INTRAVENOUS | Status: AC
  Filled 2014-11-19: qty 10

## 2014-11-19 MED ORDER — METOCLOPRAMIDE HCL 5 MG/ML IJ SOLN
INTRAMUSCULAR | Status: DC | PRN
  Administered 2014-11-19: 10 mg via INTRAVENOUS

## 2014-11-19 MED ORDER — KETOROLAC TROMETHAMINE 30 MG/ML IJ SOLN
INTRAMUSCULAR | Status: DC | PRN
  Administered 2014-11-19: 30 mg via INTRAVENOUS

## 2014-11-19 MED ORDER — SUCCINYLCHOLINE CHLORIDE 20 MG/ML IJ SOLN
INTRAMUSCULAR | Status: AC
  Filled 2014-11-19: qty 1

## 2014-11-19 MED ORDER — FENTANYL CITRATE (PF) 100 MCG/2ML IJ SOLN
INTRAMUSCULAR | Status: AC
  Administered 2014-11-19: 50 ug via INTRAVENOUS
  Filled 2014-11-19: qty 2

## 2014-11-19 MED ORDER — FENTANYL CITRATE (PF) 100 MCG/2ML IJ SOLN
25.0000 ug | INTRAMUSCULAR | Status: DC | PRN
  Administered 2014-11-19 (×2): 50 ug via INTRAVENOUS

## 2014-11-19 MED ORDER — VASOPRESSIN 20 UNIT/ML IV SOLN
INTRAVENOUS | Status: AC
  Filled 2014-11-19: qty 1

## 2014-11-19 MED ORDER — ONDANSETRON HCL 4 MG/2ML IJ SOLN
INTRAMUSCULAR | Status: DC | PRN
  Administered 2014-11-19: 4 mg via INTRAVENOUS

## 2014-11-19 MED ORDER — SUCCINYLCHOLINE CHLORIDE 20 MG/ML IJ SOLN
INTRAMUSCULAR | Status: DC | PRN
  Administered 2014-11-19: 130 mg via INTRAVENOUS

## 2014-11-19 MED ORDER — PROPOFOL 10 MG/ML IV BOLUS
INTRAVENOUS | Status: AC
  Filled 2014-11-19: qty 40

## 2014-11-19 MED ORDER — METOCLOPRAMIDE HCL 5 MG/ML IJ SOLN
INTRAMUSCULAR | Status: AC
  Filled 2014-11-19: qty 2

## 2014-11-19 MED ORDER — LACTATED RINGERS IR SOLN
Status: DC | PRN
  Administered 2014-11-19: 3000 mL

## 2014-11-19 MED ORDER — CEFAZOLIN SODIUM-DEXTROSE 2-3 GM-% IV SOLR
INTRAVENOUS | Status: AC
  Filled 2014-11-19: qty 50

## 2014-11-19 MED ORDER — MIDAZOLAM HCL 2 MG/2ML IJ SOLN
INTRAMUSCULAR | Status: AC
  Filled 2014-11-19: qty 2

## 2014-11-19 MED ORDER — FENTANYL CITRATE (PF) 100 MCG/2ML IJ SOLN
INTRAMUSCULAR | Status: DC | PRN
  Administered 2014-11-19 (×2): 50 ug via INTRAVENOUS
  Administered 2014-11-19: 100 ug via INTRAVENOUS

## 2014-11-19 MED ORDER — LACTATED RINGERS IV SOLN
INTRAVENOUS | Status: DC
  Administered 2014-11-19 (×2): via INTRAVENOUS

## 2014-11-19 MED ORDER — LIDOCAINE HCL (CARDIAC) 20 MG/ML IV SOLN
INTRAVENOUS | Status: AC
  Filled 2014-11-19: qty 5

## 2014-11-19 MED ORDER — FENTANYL CITRATE (PF) 100 MCG/2ML IJ SOLN
INTRAMUSCULAR | Status: AC
  Filled 2014-11-19: qty 2

## 2014-11-19 MED ORDER — SCOPOLAMINE 1 MG/3DAYS TD PT72
1.0000 | MEDICATED_PATCH | Freq: Once | TRANSDERMAL | Status: DC
  Administered 2014-11-19: 1.5 mg via TRANSDERMAL

## 2014-11-19 MED ORDER — DEXAMETHASONE SODIUM PHOSPHATE 4 MG/ML IJ SOLN
INTRAMUSCULAR | Status: AC
  Filled 2014-11-19: qty 1

## 2014-11-19 MED ORDER — BUPIVACAINE HCL (PF) 0.25 % IJ SOLN
INTRAMUSCULAR | Status: AC
  Filled 2014-11-19: qty 30

## 2014-11-19 MED ORDER — PROPOFOL 10 MG/ML IV BOLUS
INTRAVENOUS | Status: DC | PRN
  Administered 2014-11-19: 300 mg via INTRAVENOUS

## 2014-11-19 SURGICAL SUPPLY — 14 items
ABLATOR ENDOMETRIAL BIPOLAR (ABLATOR) ×2 IMPLANT
CATH ROBINSON RED A/P 16FR (CATHETERS) ×2 IMPLANT
CLOTH BEACON ORANGE TIMEOUT ST (SAFETY) ×2 IMPLANT
CONTAINER PREFILL 10% NBF 60ML (FORM) ×4 IMPLANT
GLOVE BIO SURGEON STRL SZ7.5 (GLOVE) ×2 IMPLANT
GOWN STRL REUS W/TWL LRG LVL3 (GOWN DISPOSABLE) ×4 IMPLANT
PACK VAGINAL MINOR WOMEN LF (CUSTOM PROCEDURE TRAY) ×2 IMPLANT
PAD OB MATERNITY 4.3X12.25 (PERSONAL CARE ITEMS) ×2 IMPLANT
PAD PREP 24X48 CUFFED NSTRL (MISCELLANEOUS) ×2 IMPLANT
SYR TB 1ML 25GX5/8 (SYRINGE) ×2 IMPLANT
TOWEL OR 17X24 6PK STRL BLUE (TOWEL DISPOSABLE) ×4 IMPLANT
TUBING AQUILEX INFLOW (TUBING) ×2 IMPLANT
TUBING AQUILEX OUTFLOW (TUBING) ×2 IMPLANT
WATER STERILE IRR 1000ML POUR (IV SOLUTION) ×2 IMPLANT

## 2014-11-19 NOTE — Op Note (Signed)
[name], Kathleen Walsh  MEDICAL RECORD NO.:  [id number]  LOCATION:  [facility]                          FACILITY:  [facility]  PHYSICIAN:  Kathleen Walsh, M.D.DATE OF BIRTH:  1980-12-27  DATE OF PROCEDURE:  11/19/2014 DATE OF DISCHARGE:                              OPERATIVE REPORT   PREOPERATIVE DIAGNOSIS:  Refractory menorrhagia with contraindication to medical therapy.  POSTOPERATIVE DIAGNOSIS:  Refractory menorrhagia with contraindication to medical therapy.  PROCEDURE:  Diagnostic hysteroscopy, D and C, NovaSure endometrial ablation.  SURGEON:  Kathleen Walsh, M.D.  ASSISTANT:  None.  ANESTHESIA:  General and local.  ESTIMATED BLOOD LOSS:  Less than 50 mL.  FLUID DEFICIT:  85 mL.  COMPLICATIONS:  None.  DRAINS:  None.  COUNTS:  Correct.  DISPOSITION:  The patient to recovery room in good condition.  BRIEF OPERATIVE NOTE:  After being apprised of the risks of anesthesia, infection, bleeding, injury to surrounding organs, possible need for repair, delayed versus immediate complications to include bowel and bladder injury with possible need for repair, the patient was brought to the operating room, where she was administered general anesthetic without complications.  Prepped and draped in usual sterile fashion. Catheterized until the bladder was empty.  Exam under anesthesia reveals a bulky anteflexed uterus and no adnexal masses.  A dilute paracervical block was placed using 20 mL of 0.25% Marcaine solution and cervix was easily dilated up to a #21 Pratt dilator.  Hysteroscope placed. Visualization reveals bilateral normal tubal ostia and evidence of a small fundal defect, which appears superficial in the midline.  There is no evidence of any other intracavitary lesions.  D and C was performed using sharp curettage in a 4-quadrant method and specimen was collected. NovaSure device was placed, seated to a length of 6.5 and a width  of 4.2, initiated with a wattage of 150 watts.  At this time, NovaSure procedure was performed after negative CO2 test without complications. The device was removed and inspected and found to be intact.  The endometrial cavity was re-visualized with no evidence of endometrial or uterine perforation.  Good hemostasis was noted.  All instruments were removed.  The patient tolerated the procedure well, was awakened and transferred to recovery in good condition.     Kathleen Walsh, M.D.     RJT/MEDQ  D:  11/19/2014  T:  11/19/2014  Job:  [id number]

## 2014-11-19 NOTE — Progress Notes (Signed)
Patient ID: Kathleen Walsh, female   DOB: 1980-09-02, 34 y.o.   MRN: [id number] Patient seen and examined. Consent witnessed and signed. No changes noted. Update completed.

## 2014-11-19 NOTE — Transfer of Care (Signed)
Immediate Anesthesia Transfer of Care Note  Patient: Kathleen Walsh  Procedure(s) Performed: Procedure(s): DILATATION & CURETTAGE/HYSTEROSCOPY WITH NOVASURE ABLATION (N/A)  Patient Location: PACU  Anesthesia Type:General  Level of Consciousness: awake, alert  and oriented  Airway & Oxygen Therapy: Patient Spontanous Breathing and Patient connected to nasal cannula oxygen  Post-op Assessment: Report given to RN and Post -op Vital signs reviewed and stable  Post vital signs: Reviewed and stable  Last Vitals:  Filed Vitals:   11/19/14 0709  BP: 125/70  Pulse: 88  Temp: 37.3 C  Resp: 20    Complications: No apparent anesthesia complications

## 2014-11-19 NOTE — Anesthesia Procedure Notes (Signed)
Procedure Name: Intubation Date/Time: 11/19/2014 8:48 AM Performed by: Jonna Munro Pre-anesthesia Checklist: Patient identified, Emergency Drugs available, Suction available, Timeout performed and Patient being monitored Patient Re-evaluated:Patient Re-evaluated prior to inductionOxygen Delivery Method: Circle system utilized Preoxygenation: Pre-oxygenation with 100% oxygen Intubation Type: IV induction, Rapid sequence and Cricoid Pressure applied Laryngoscope Size: Mac and 3 Grade View: Grade I Tube type: Oral Tube size: 7.0 mm Number of attempts: 1 Airway Equipment and Method: Patient positioned with wedge pillow and Stylet Placement Confirmation: ETT inserted through vocal cords under direct vision,  positive ETCO2 and breath sounds checked- equal and bilateral Secured at: 21 cm Tube secured with: Tape Dental Injury: Teeth and Oropharynx as per pre-operative assessment

## 2014-11-19 NOTE — Op Note (Signed)
11/19/2014  9:25 AM  PATIENT:  Kathleen Walsh  34 y.o. female  PRE-OPERATIVE DIAGNOSIS:  Menorrhagia  POST-OPERATIVE DIAGNOSIS:  * No post-op diagnosis entered *  PROCEDURE:  Procedure(s): DILATATION & CURETTAGE/HYSTEROSCOPY WITH NOVASURE ABLATION  SURGEON:  Surgeon(s): Brien Few, MD  ASSISTANTS: none   ANESTHESIA:   local and general  ESTIMATED BLOOD LOSS: minimal and fluid deficit 85cc  DRAINS: none   LOCAL MEDICATIONS USED:  MARCAINE    and Amount: 20 ml  SPECIMEN:  Source of Specimen:  [facility]  DISPOSITION OF SPECIMEN:  PATHOLOGY  COUNTS:  YES  DICTATION #: [id number]  PLAN OF CARE: dc home  PATIENT DISPOSITION:  PACU - hemodynamically stable.

## 2014-11-19 NOTE — Discharge Instructions (Signed)
Hysteroscopy, Care After Refer to this sheet in the next few weeks. These instructions provide you with information on caring for yourself after your procedure. Your health care provider may also give you more specific instructions. Your treatment has been planned according to current medical practices, but problems sometimes occur. Call your health care provider if you have any problems or questions after your procedure.  WHAT TO EXPECT AFTER THE PROCEDURE After your procedure, it is typical to have the following:  You may have some cramping. This normally lasts for a couple days.  You may have bleeding. This can vary from light spotting for a few days to menstrual-like bleeding for 3-7 days. HOME CARE INSTRUCTIONS  Rest for the first 1-2 days after the procedure.  Only take over-the-counter or prescription medicines as directed by your health care provider. Do not take aspirin. It can increase the chances of bleeding.  Take showers instead of baths for 2 weeks or as directed by your health care provider.  Do not drive for 24 hours or as directed.  Do not drink alcohol while taking pain medicine.  Do not use tampons, douche, or have sexual intercourse for 2 weeks or until your health care provider says it is okay.  Take your temperature twice a day for 4-5 days. Write it down each time.  Follow your health care provider's advice about diet, exercise, and lifting.  If you develop constipation, you may:  Take a mild laxative if your health care provider approves.  Add bran foods to your diet.  Drink enough fluids to keep your urine clear or pale yellow.  Try to have someone with you or available to you for the first 24-48 hours, especially if you were given a general anesthetic.  Follow up with your health care provider as directed. SEEK MEDICAL CARE IF:  You feel dizzy or lightheaded.  You feel sick to your stomach (nauseous).  You have abnormal vaginal discharge.  You  have a rash.  You have pain that is not controlled with medicine. SEEK IMMEDIATE MEDICAL CARE IF:  You have bleeding that is heavier than a normal menstrual period.  You have a fever.  You have increasing cramps or pain, not controlled with medicine.  You have new belly (abdominal) pain.  You pass out.  You have pain in the tops of your shoulders (shoulder strap areas).  You have shortness of breath. Document Released: 02/06/2013 Document Reviewed: 02/06/2013 [facility] Patient Information 2015 [place], [place]. This information is not intended to replace advice given to you by your health care provider. Make sure you discuss any questions you have with your health care provider. General Anesthesia, Care After Refer to this sheet in the next few weeks. These instructions provide you with information on caring for yourself after your procedure. Your health care provider may also give you more specific instructions. Your treatment has been planned according to current medical practices, but problems sometimes occur. Call your health care provider if you have any problems or questions after your procedure. WHAT TO EXPECT AFTER THE PROCEDURE After the procedure, it is typical to experience:  Sleepiness.  Nausea and vomiting. HOME CARE INSTRUCTIONS  For the first 24 hours after general anesthesia:  Have a responsible person with you.  Do not drive a car. If you are alone, do not take public transportation.  Do not drink alcohol.  Do not take medicine that has not been prescribed by your health care provider.  Do not sign important  papers or make important decisions.  You may resume a normal diet and activities as directed by your health care provider.  Change bandages (dressings) as directed.  If you have questions or problems that seem related to general anesthesia, call the hospital and ask for the anesthetist or anesthesiologist on call. SEEK MEDICAL CARE IF:  You have  nausea and vomiting that continue the day after anesthesia.  You develop a rash. SEEK IMMEDIATE MEDICAL CARE IF:   You have difficulty breathing.  You have chest pain.  You have any allergic problems. Document Released: 07/25/2000 Document Revised: 04/23/2013 Document Reviewed: 11/01/2012 [facility] Patient Information 2015 [place], [place]. This information is not intended to replace advice given to you by your health care provider. Make sure you discuss any questions you have with your health care provider.

## 2014-11-19 NOTE — Anesthesia Postprocedure Evaluation (Signed)
  Anesthesia Post-op Note  Patient: Kathleen Walsh  Procedure(s) Performed: Procedure(s) (LRB): DILATATION & CURETTAGE/HYSTEROSCOPY WITH NOVASURE ABLATION (N/A)  Patient Location: PACU  Anesthesia Type: General  Level of Consciousness: awake and alert   Airway and Oxygen Therapy: Patient Spontanous Breathing  Post-op Pain: mild  Post-op Assessment: Post-op Vital signs reviewed, Patient's Cardiovascular Status Stable, Respiratory Function Stable, Patent Airway and No signs of Nausea or vomiting  Last Vitals:  Filed Vitals:   11/19/14 0945  BP: 121/58  Pulse: 87  Temp:   Resp: 17    Post-op Vital Signs: stable   Complications: No apparent anesthesia complications

## 2014-11-20 ENCOUNTER — Encounter (HOSPITAL_COMMUNITY): Payer: Self-pay | Admitting: Obstetrics and Gynecology

## 2014-11-25 ENCOUNTER — Ambulatory Visit: Payer: Self-pay | Admitting: General Surgery

## 2014-11-25 NOTE — Progress Notes (Signed)
Please put orders in Epic surgery 12-03-14 pre op 11-28-14 Thanks

## 2014-11-25 NOTE — H&P (Signed)
Kathleen Walsh. [name] 11/12/2014 10:57 AM Location: [facility] Patient #: [id number] DOB: [birth year] Married / Language: Kathleen Walsh / Race: White Female  History of Present Illness [name] M.  MD; 11/12/2014 11:22 AM) Patient words: reck.  The patient is a 34 year old female who presents with non-malignant abdominal pain. She comes back in complaining of recurrent periumbilical pain and tenderness. She actually saw Dr. Dalbert Batman late last week. He gave her refill on pain medication. She states that she had been doing better until she had recurrent periumbilical pain as well as a feeling of a lump in that area last week. It is now interfering with her work. Brief history: She underwent a laparoscopic ventral hernia repair with mesh in May 2012. I used a 15 x 20 cm piece of parietex mesh. She had a fair amount of pain postoperatively and was actually readmitted and had a CT scan done after surgery. At that time she was found to have some retained fat within the hernia sac. She states she had been doing well until sometime last week when she developed some pain around her belly button and on the right side. She also reported nausea and vomiting. She denies any fever or chills. She is able to tolerate liquids. She ended up going to the emergency room yesterday and underwent labs and a CT scan. Her blood work was normal. There was no elevated white blood cell count. She was placed on Keflex and given a prescription for Zofran and Percocet. She denies any trauma to the area. She did have a C-section in October 2014. She is still smoking about 5 cigarettes a day. She also reports some constipation.   Problem List/Past Medical [name] Ronnie Derby, MD; 11/12/2014 11:22 AM) ACUTE PERIUMBILICAL PAIN (741.28  N86.76)  Other Problems [name], MD; 11/12/2014 11:22 AM) Ventral Hernia Repair Sleep Apnea Diabetes Mellitus Gastroesophageal Reflux Disease Migraine  Headache Depression Back Pain  Past Surgical History [name], MD; 11/12/2014 11:22 AM) Foot Surgery Left. Oral Surgery Resection of Stomach Cesarean Section - Multiple  Diagnostic Studies History [name], MD; 11/12/2014 11:22 AM) Colonoscopy 5-10 years ago Mammogram never Pap Smear 1-5 years ago  Allergies Marjean Donna, [place]; 11/12/2014 10:58 AM) No Known Drug Allergies06/16/2016  Medication History ([name], CMA; 11/12/2014 10:58 AM) Zofran ODT (4MG  Tablet Disperse, 1 (one) Oral every four hours, as needed, Taken starting 10/16/2014) Active. Percocet (5-325MG  Tablet, 1 (one) Tablet Oral every four hours, as needed, Taken starting 11/10/2014) Active. Ondansetron HCl (4MG  Tablet, Oral) Active. Furosemide (20MG  Tablet, Oral) Active. Cymbalta (20MG  Capsule DR Part, Oral) Active. DULoxetine HCl (60MG  Capsule DR Part, Oral) Active. Medications Reconciled  Social History [name], MD; 11/12/2014 11:22 AM) Alcohol use Remotely quit alcohol use. Caffeine use Carbonated beverages, Tea. No drug use Tobacco use Current every day smoker.  Family History [name], MD; 11/12/2014 11:22 AM) Diabetes Mellitus Father, Mother, Sister. Hypertension Father. Cerebrovascular Accident Family Members In General, Father. Arthritis Father. Breast Cancer Family Members In General. Cancer Family Members In General.  Pregnancy / Birth History [name], MD; 11/12/2014 11:22 AM) Maternal age 27-25 Para 2 Irregular periods Age at menarche 74 years. Gravida 4  Vitals ([name] CMA; 11/12/2014 10:57 AM) 11/12/2014 10:57 AM Weight: 293 lb Height: 66in Body Surface Area: 2.49 m Body Mass Index: 47.29 kg/m Temp.: 97.69F(Temporal)  Pulse: 80 (Regular)  BP: 136/76 (Sitting, Left Arm, Standard)    Physical Exam [name] M.  MD; 11/12/2014  11:18 AM) General Mental Status-Alert. General Appearance-Consistent with  stated age. Hydration-Well hydrated. Voice-Normal. Note: morbidly obese   Head and Neck Head-normocephalic, atraumatic with no lesions or palpable masses. Trachea-midline. Thyroid Gland Characteristics - normal size and consistency.  Eye Eyeball - Bilateral-Extraocular movements intact. Sclera/Conjunctiva - Bilateral-No scleral icterus.  Chest and Lung Exam Chest and lung exam reveals -quiet, even and easy respiratory effort with no use of accessory muscles and on auscultation, normal breath sounds, no adventitious sounds and normal vocal resonance. Inspection Chest Wall - Normal. Back - normal.  Breast - Did not examine.  Cardiovascular Cardiovascular examination reveals -normal heart sounds, regular rate and rhythm with no murmurs and normal pedal pulses bilaterally.  Abdomen Inspection Inspection of the abdomen reveals - No Hernias. Skin - Scar - Note: well healed lateral trocar sites. Palpation/Percussion Palpation and Percussion of the abdomen reveal - Soft, No Rebound tenderness, No Rigidity (guarding) and No hepatosplenomegaly. Auscultation Auscultation of the abdomen reveals - Bowel sounds normal. Note: obese, in supraumbilical position, there is a soft tissue lump/mass that is TTP. no cellulitis/induration/fluctuance   Peripheral Vascular Upper Extremity Palpation - Pulses bilaterally normal.  Neurologic Neurologic evaluation reveals -alert and oriented x 3 with no impairment of recent or remote memory. Mental Status-Normal.  Neuropsychiatric The patient's mood and affect are described as -normal. Judgment and Insight-insight is appropriate concerning matters relevant to self.  Musculoskeletal Normal Exam - Left-Upper Extremity Strength Normal and Lower Extremity Strength Normal. Normal Exam - Right-Upper Extremity Strength Normal and Lower Extremity Strength Normal.  Lymphatic Head & Neck  General Head & Neck Lymphatics:  Bilateral - Description - Normal. Axillary - Did not examine. Femoral & Inguinal - Did not examine.    Assessment & Plan [name] M.  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 We discussed risks and benefits of surgery including but not limited to bleeding, infection, injury to other structures, blood clot formation, ileus, nonhealing wound, mesh infection, need for additional procedures, possible abdominal wall hernia repair, failure to resolve her pain, and anesthesia issues. I advised her strongly to stop smoking in order to decrease her risk of wound infection and cardiac and pulmonary issues perioperatively. She agrees to the plan Current Plans  Schedule for Surgery  Leighton Ruff. Redmond Pulling, MD, FACS General, Bariatric, & Minimally Invasive Surgery [facility], [place]

## 2014-11-27 NOTE — Patient Instructions (Addendum)
YOUR PROCEDURE IS SCHEDULED ON : 12/03/14  REPORT TO [facility] MAIN ENTRANCE FOLLOW SIGNS TO EAST ELEVATOR - GO TO 3rd FLOOR CHECK IN AT 3 EAST NURSES STATION (SHORT STAY) AT:  6:30 AM  CALL THIS NUMBER IF YOU HAVE PROBLEMS THE MORNING OF SURGERY [phone]  REMEMBER:ONLY 1 PER PERSON MAY GO TO SHORT STAY WITH YOU TO GET READY THE MORNING OF YOUR SURGERY  DO NOT EAT FOOD OR DRINK LIQUIDS AFTER MIDNIGHT  TAKE THESE MEDICINES THE MORNING OF SURGERY: CYMBALTA / MAY TAKE HYDROCODONE IF NEEDED  BRING C-PAP TUBING AND MASK TO HOSPITAL  YOU MAY NOT HAVE ANY METAL ON YOUR BODY INCLUDING HAIR PINS AND PIERCING'S. DO NOT WEAR JEWELRY, MAKEUP, LOTIONS, POWDERS OR PERFUMES. DO NOT WEAR NAIL POLISH. DO NOT SHAVE 48 HRS PRIOR TO SURGERY. MEN MAY SHAVE FACE AND NECK.  DO NOT [place]. [place] IS NOT RESPONSIBLE FOR VALUABLES.  CONTACTS, DENTURES OR PARTIALS MAY NOT BE WORN TO SURGERY. LEAVE SUITCASE IN CAR. CAN BE BROUGHT TO ROOM AFTER SURGERY.  PATIENTS DISCHARGED THE DAY OF SURGERY WILL NOT BE ALLOWED TO DRIVE HOME.  PLEASE READ OVER THE FOLLOWING INSTRUCTION SHEETS _________________________________________________________________________________                                           - PREPARING FOR SURGERY  Before surgery, you can play an important role.  Because skin is not sterile, your skin needs to be as free of germs as possible.  You can reduce the number of germs on your skin by washing with CHG (chlorahexidine gluconate) soap before surgery.  CHG is an antiseptic cleaner which kills germs and bonds with the skin to continue killing germs even after washing. Please DO NOT use if you have an allergy to CHG or antibacterial soaps.  If your skin becomes reddened/irritated stop using the CHG and inform your nurse when you arrive at Short Stay. Do not shave (including legs and underarms) for at least 48 hours prior to the first CHG  shower.  You may shave your face. Please follow these instructions carefully:   1.  Shower with CHG Soap the night before surgery and the  morning of Surgery.   2.  If you choose to wash your hair, wash your hair first as usual with your  normal  Shampoo.   3.  After you shampoo, rinse your hair and body thoroughly to remove the  shampoo.                                         4.  Use CHG as you would any other liquid soap.  You can apply chg directly  to the skin and wash . Gently wash with scrungie or clean wascloth    5.  Apply the CHG Soap to your body ONLY FROM THE NECK DOWN.   Do not use on open                           Wound or open sores. Avoid contact with eyes, ears mouth and genitals (private parts).  Genitals (private parts) with your normal soap.              6.  Wash thoroughly, paying special attention to the area where your surgery  will be performed.   7.  Thoroughly rinse your body with warm water from the neck down.   8.  DO NOT shower/wash with your normal soap after using and rinsing off  the CHG Soap .                9.  Pat yourself dry with a clean towel.             10.  Wear clean night clothes to bed after shower             11.  Place clean sheets on your bed the night of your first shower and do not  sleep with pets.  Day of Surgery : Do not apply any lotions/deodorants the morning of surgery.  Please wear clean clothes to the hospital/surgery center.  FAILURE TO FOLLOW THESE INSTRUCTIONS MAY RESULT IN THE CANCELLATION OF YOUR SURGERY    PATIENT SIGNATURE_________________________________  ______________________________________________________________________

## 2014-11-28 ENCOUNTER — Encounter (HOSPITAL_COMMUNITY)
Admission: RE | Admit: 2014-11-28 | Discharge: 2014-11-28 | Disposition: A | Discharge status: Home / Self Care | Payer: BLUE CROSS/BLUE SHIELD | Source: Ambulatory Visit | Attending: General Surgery | Admitting: General Surgery

## 2014-11-28 ENCOUNTER — Encounter (HOSPITAL_COMMUNITY): Payer: Self-pay

## 2014-11-28 DIAGNOSIS — R109 Unspecified abdominal pain: Secondary | ICD-10-CM | POA: Insufficient documentation

## 2014-11-28 DIAGNOSIS — Z01812 Encounter for preprocedural laboratory examination: Secondary | ICD-10-CM | POA: Insufficient documentation

## 2014-11-28 DIAGNOSIS — D175 Benign lipomatous neoplasm of intra-abdominal organs: Secondary | ICD-10-CM | POA: Insufficient documentation

## 2014-11-28 HISTORY — DX: Unspecified abdominal pain: R10.9

## 2014-11-28 LAB — COMPREHENSIVE METABOLIC PANEL
ALT: 15 U/L (ref 14–54)
ANION GAP: 9 (ref 5–15)
AST: 13 U/L — ABNORMAL LOW (ref 15–41)
Albumin: 3.9 g/dL (ref 3.5–5.0)
Alkaline Phosphatase: 81 U/L (ref 38–126)
BILIRUBIN TOTAL: 0.3 mg/dL (ref 0.3–1.2)
BUN: 15 mg/dL (ref 6–20)
CALCIUM: 8.8 mg/dL — AB (ref 8.9–10.3)
CO2: 25 mmol/L (ref 22–32)
Chloride: 107 mmol/L (ref 101–111)
Creatinine, Ser: 0.57 mg/dL (ref 0.44–1.00)
GFR calc Af Amer: >60 mL/min (ref >60)
GFR calc non Af Amer: >60 mL/min (ref >60)
Glucose, Bld: 95 mg/dL (ref 65–99)
Potassium: 3.9 mmol/L (ref 3.5–5.1)
SODIUM: 141 mmol/L (ref 135–145)
TOTAL PROTEIN: 7.2 g/dL (ref 6.5–8.1)

## 2014-11-28 LAB — CBC WITH DIFFERENTIAL/PLATELET
BASOS PCT: 0 % (ref 0–1)
Basophils Absolute: 0 10*3/uL (ref 0.0–0.1)
EOS ABS: 0.2 10*3/uL (ref 0.0–0.7)
EOS PCT: 2 % (ref 0–5)
HCT: 36.1 % (ref 36.0–46.0)
Hemoglobin: 10.3 g/dL — ABNORMAL LOW (ref 12.0–15.0)
LYMPHS PCT: 18 % (ref 12–46)
Lymphs Abs: 2.1 10*3/uL (ref 0.7–4.0)
MCH: 19.2 pg — ABNORMAL LOW (ref 26.0–34.0)
MCHC: 28.5 g/dL — ABNORMAL LOW (ref 30.0–36.0)
MCV: 67.4 fL — AB (ref 78.0–100.0)
Monocytes Absolute: 0.7 10*3/uL (ref 0.1–1.0)
Monocytes Relative: 6 % (ref 3–12)
Neutro Abs: 8.5 10*3/uL — ABNORMAL HIGH (ref 1.7–7.7)
Neutrophils Relative %: 74 % (ref 43–77)
Platelets: 232 10*3/uL (ref 150–400)
RBC: 5.36 MIL/uL — AB (ref 3.87–5.11)
RDW: 19.3 % — ABNORMAL HIGH (ref 11.5–15.5)
WBC: 11.5 10*3/uL — AB (ref 4.0–10.5)

## 2014-11-28 LAB — HCG, SERUM, QUALITATIVE: Preg, Serum: NEGATIVE

## 2014-12-02 MED ORDER — DEXTROSE 5 % IV SOLN
3.0000 g | INTRAVENOUS | Status: AC
  Administered 2014-12-03: 3 g via INTRAVENOUS
  Filled 2014-12-02: qty 3000

## 2014-12-03 ENCOUNTER — Ambulatory Visit (HOSPITAL_COMMUNITY): Payer: BLUE CROSS/BLUE SHIELD | Admitting: Certified Registered Nurse Anesthetist

## 2014-12-03 ENCOUNTER — Ambulatory Visit (HOSPITAL_COMMUNITY)
Admission: RE | Admit: 2014-12-03 | Discharge: 2014-12-03 | Disposition: A | Discharge status: Home / Self Care | Payer: BLUE CROSS/BLUE SHIELD | Source: Ambulatory Visit | Attending: General Surgery | Admitting: General Surgery

## 2014-12-03 ENCOUNTER — Encounter (HOSPITAL_COMMUNITY)
Admission: RE | Disposition: A | Discharge status: Home / Self Care | Payer: Self-pay | Source: Ambulatory Visit | Attending: General Surgery

## 2014-12-03 ENCOUNTER — Encounter (HOSPITAL_COMMUNITY): Payer: Self-pay | Admitting: *Deleted

## 2014-12-03 DIAGNOSIS — E119 Type 2 diabetes mellitus without complications: Secondary | ICD-10-CM | POA: Diagnosis not present

## 2014-12-03 DIAGNOSIS — D171 Benign lipomatous neoplasm of skin and subcutaneous tissue of trunk: Secondary | ICD-10-CM | POA: Diagnosis not present

## 2014-12-03 DIAGNOSIS — F17210 Nicotine dependence, cigarettes, uncomplicated: Secondary | ICD-10-CM | POA: Insufficient documentation

## 2014-12-03 DIAGNOSIS — Z79891 Long term (current) use of opiate analgesic: Secondary | ICD-10-CM | POA: Diagnosis not present

## 2014-12-03 DIAGNOSIS — Z6841 Body Mass Index (BMI) 40.0 and over, adult: Secondary | ICD-10-CM | POA: Insufficient documentation

## 2014-12-03 DIAGNOSIS — F329 Major depressive disorder, single episode, unspecified: Secondary | ICD-10-CM | POA: Insufficient documentation

## 2014-12-03 DIAGNOSIS — G4730 Sleep apnea, unspecified: Secondary | ICD-10-CM | POA: Insufficient documentation

## 2014-12-03 DIAGNOSIS — K219 Gastro-esophageal reflux disease without esophagitis: Secondary | ICD-10-CM | POA: Diagnosis not present

## 2014-12-03 DIAGNOSIS — K660 Peritoneal adhesions (postprocedural) (postinfection): Secondary | ICD-10-CM | POA: Diagnosis not present

## 2014-12-03 DIAGNOSIS — M1990 Unspecified osteoarthritis, unspecified site: Secondary | ICD-10-CM | POA: Diagnosis not present

## 2014-12-03 HISTORY — PX: LAPAROSCOPY: SHX197

## 2014-12-03 HISTORY — PX: DEBRIDEMENT OF ABDOMINAL WALL ABSCESS: SHX6396

## 2014-12-03 SURGERY — LAPAROSCOPY, DIAGNOSTIC
Anesthesia: General | Site: Abdomen

## 2014-12-03 MED ORDER — FENTANYL CITRATE (PF) 250 MCG/5ML IJ SOLN
INTRAMUSCULAR | Status: AC
  Filled 2014-12-03: qty 25

## 2014-12-03 MED ORDER — MIDAZOLAM HCL 2 MG/2ML IJ SOLN
INTRAMUSCULAR | Status: AC
  Filled 2014-12-03: qty 4

## 2014-12-03 MED ORDER — ROCURONIUM BROMIDE 100 MG/10ML IV SOLN
INTRAVENOUS | Status: DC | PRN
  Administered 2014-12-03: 10 mg via INTRAVENOUS
  Administered 2014-12-03: 30 mg via INTRAVENOUS
  Administered 2014-12-03: 20 mg via INTRAVENOUS
  Administered 2014-12-03: 10 mg via INTRAVENOUS

## 2014-12-03 MED ORDER — PROPOFOL 10 MG/ML IV BOLUS
INTRAVENOUS | Status: AC
  Filled 2014-12-03: qty 20

## 2014-12-03 MED ORDER — FENTANYL CITRATE (PF) 100 MCG/2ML IJ SOLN
INTRAMUSCULAR | Status: AC
  Filled 2014-12-03: qty 2

## 2014-12-03 MED ORDER — SODIUM CHLORIDE 0.9 % IR SOLN
Status: DC | PRN
  Administered 2014-12-03: 500 mL

## 2014-12-03 MED ORDER — SODIUM CHLORIDE 0.9 % IR SOLN
Status: AC
  Filled 2014-12-03: qty 1

## 2014-12-03 MED ORDER — METOCLOPRAMIDE HCL 5 MG/ML IJ SOLN
INTRAMUSCULAR | Status: DC | PRN
  Administered 2014-12-03: 10 mg via INTRAVENOUS

## 2014-12-03 MED ORDER — ONDANSETRON HCL 4 MG/2ML IJ SOLN
INTRAMUSCULAR | Status: DC | PRN
  Administered 2014-12-03 (×2): 2 mg via INTRAVENOUS

## 2014-12-03 MED ORDER — SODIUM CHLORIDE 0.9 % IJ SOLN: 3.0000 mL | INTRAMUSCULAR | Status: DC | PRN

## 2014-12-03 MED ORDER — METOCLOPRAMIDE HCL 5 MG/ML IJ SOLN
INTRAMUSCULAR | Status: AC
  Filled 2014-12-03: qty 2

## 2014-12-03 MED ORDER — CHLORHEXIDINE GLUCONATE 4 % EX LIQD: 1.0000 "application " | Freq: Once | CUTANEOUS | Status: DC

## 2014-12-03 MED ORDER — FENTANYL CITRATE (PF) 100 MCG/2ML IJ SOLN
25.0000 ug | INTRAMUSCULAR | Status: DC | PRN
  Administered 2014-12-03 (×3): 50 ug via INTRAVENOUS

## 2014-12-03 MED ORDER — OXYCODONE HCL 5 MG PO TABS
5.0000 mg | ORAL_TABLET | ORAL | Status: DC | PRN
  Administered 2014-12-03: 5 mg via ORAL
  Filled 2014-12-03: qty 1

## 2014-12-03 MED ORDER — NEOSTIGMINE METHYLSULFATE 10 MG/10ML IV SOLN
INTRAVENOUS | Status: AC
  Filled 2014-12-03: qty 1

## 2014-12-03 MED ORDER — MORPHINE SULFATE 10 MG/ML IJ SOLN: 1.0000 mg | INTRAMUSCULAR | Status: DC | PRN

## 2014-12-03 MED ORDER — ONDANSETRON HCL 4 MG/2ML IJ SOLN
INTRAMUSCULAR | Status: AC
  Filled 2014-12-03: qty 2

## 2014-12-03 MED ORDER — DEXAMETHASONE SODIUM PHOSPHATE 10 MG/ML IJ SOLN
INTRAMUSCULAR | Status: DC | PRN
  Administered 2014-12-03: 10 mg via INTRAVENOUS

## 2014-12-03 MED ORDER — SCOPOLAMINE 1 MG/3DAYS TD PT72
MEDICATED_PATCH | TRANSDERMAL | Status: AC
  Filled 2014-12-03: qty 1

## 2014-12-03 MED ORDER — ACETAMINOPHEN 325 MG PO TABS: 650.0000 mg | ORAL_TABLET | ORAL | Status: DC | PRN

## 2014-12-03 MED ORDER — SODIUM CHLORIDE 0.9 % IJ SOLN: 3.0000 mL | Freq: Two times a day (BID) | INTRAMUSCULAR | Status: DC

## 2014-12-03 MED ORDER — SCOPOLAMINE 1 MG/3DAYS TD PT72
MEDICATED_PATCH | TRANSDERMAL | Status: DC | PRN
  Administered 2014-12-03: 1 via TRANSDERMAL

## 2014-12-03 MED ORDER — MIDAZOLAM HCL 5 MG/5ML IJ SOLN
INTRAMUSCULAR | Status: DC | PRN
  Administered 2014-12-03 (×2): 2 mg via INTRAVENOUS

## 2014-12-03 MED ORDER — GLYCOPYRROLATE 0.2 MG/ML IJ SOLN
INTRAMUSCULAR | Status: DC | PRN
  Administered 2014-12-03: .4 mg via INTRAVENOUS

## 2014-12-03 MED ORDER — OXYCODONE-ACETAMINOPHEN 5-325 MG PO TABS: 1.0000 | ORAL_TABLET | ORAL | Status: DC | PRN

## 2014-12-03 MED ORDER — ACETAMINOPHEN 650 MG RE SUPP
650.0000 mg | RECTAL | Status: DC | PRN
  Filled 2014-12-03: qty 1

## 2014-12-03 MED ORDER — PROPOFOL 10 MG/ML IV BOLUS
INTRAVENOUS | Status: AC
Start: 2014-12-03 — End: 2014-12-03
  Filled 2014-12-03: qty 20

## 2014-12-03 MED ORDER — SODIUM CHLORIDE 0.9 % IV SOLN: 250.0000 mL | INTRAVENOUS | Status: DC | PRN

## 2014-12-03 MED ORDER — LACTATED RINGERS IV SOLN
INTRAVENOUS | Status: DC | PRN
Start: 2014-12-03 — End: 2014-12-03
  Administered 2014-12-03 (×3): via INTRAVENOUS

## 2014-12-03 MED ORDER — LACTATED RINGERS IV SOLN: INTRAVENOUS | Status: DC

## 2014-12-03 MED ORDER — GLYCOPYRROLATE 0.2 MG/ML IJ SOLN
INTRAMUSCULAR | Status: AC
  Filled 2014-12-03: qty 3

## 2014-12-03 MED ORDER — EPHEDRINE SULFATE 50 MG/ML IJ SOLN
INTRAMUSCULAR | Status: AC
  Filled 2014-12-03: qty 1

## 2014-12-03 MED ORDER — PROPOFOL 10 MG/ML IV BOLUS
INTRAVENOUS | Status: DC | PRN
  Administered 2014-12-03: 200 mg via INTRAVENOUS
  Administered 2014-12-03: 50 mg via INTRAVENOUS

## 2014-12-03 MED ORDER — SODIUM CHLORIDE 0.9 % IJ SOLN
INTRAMUSCULAR | Status: AC
  Filled 2014-12-03: qty 10

## 2014-12-03 MED ORDER — SUCCINYLCHOLINE CHLORIDE 20 MG/ML IJ SOLN
INTRAMUSCULAR | Status: DC | PRN
  Administered 2014-12-03: 140 mg via INTRAVENOUS

## 2014-12-03 MED ORDER — NEOSTIGMINE METHYLSULFATE 10 MG/10ML IV SOLN
INTRAVENOUS | Status: DC | PRN
  Administered 2014-12-03: 3 mg via INTRAVENOUS

## 2014-12-03 MED ORDER — FENTANYL CITRATE (PF) 100 MCG/2ML IJ SOLN
INTRAMUSCULAR | Status: DC | PRN
  Administered 2014-12-03 (×5): 50 ug via INTRAVENOUS
  Administered 2014-12-03: 100 ug via INTRAVENOUS

## 2014-12-03 MED ORDER — DEXAMETHASONE SODIUM PHOSPHATE 10 MG/ML IJ SOLN
INTRAMUSCULAR | Status: AC
  Filled 2014-12-03: qty 1

## 2014-12-03 MED ORDER — BUPIVACAINE-EPINEPHRINE (PF) 0.25% -1:200000 IJ SOLN
INTRAMUSCULAR | Status: AC
  Filled 2014-12-03: qty 30

## 2014-12-03 MED ORDER — ROCURONIUM BROMIDE 100 MG/10ML IV SOLN
INTRAVENOUS | Status: AC
  Filled 2014-12-03: qty 1

## 2014-12-03 MED ORDER — MEPERIDINE HCL 50 MG/ML IJ SOLN: 6.2500 mg | INTRAMUSCULAR | Status: DC | PRN

## 2014-12-03 MED ORDER — LIDOCAINE HCL (CARDIAC) 20 MG/ML IV SOLN
INTRAVENOUS | Status: DC | PRN
Start: 2014-12-03 — End: 2014-12-03
  Administered 2014-12-03: 100 mg via INTRAVENOUS

## 2014-12-03 MED ORDER — BUPIVACAINE-EPINEPHRINE (PF) 0.25% -1:200000 IJ SOLN
INTRAMUSCULAR | Status: DC | PRN
  Administered 2014-12-03: 30 mL

## 2014-12-03 MED ORDER — LIDOCAINE HCL (CARDIAC) 20 MG/ML IV SOLN
INTRAVENOUS | Status: AC
  Filled 2014-12-03: qty 5

## 2014-12-03 MED ORDER — PROMETHAZINE HCL 25 MG/ML IJ SOLN: 6.2500 mg | INTRAMUSCULAR | Status: DC | PRN

## 2014-12-03 SURGICAL SUPPLY — 70 items
APPLIER CLIP 5 13 M/L LIGAMAX5 (MISCELLANEOUS)
APPLIER CLIP ROT 10 11.4 M/L (STAPLE)
BENZOIN TINCTURE PRP APPL 2/3 (GAUZE/BANDAGES/DRESSINGS) ×3 IMPLANT
BLADE EXTENDED COATED 6.5IN (ELECTRODE) IMPLANT
BLADE HEX COATED 2.75 (ELECTRODE) IMPLANT
BLADE SURG SZ10 CARB STEEL (BLADE) ×3 IMPLANT
CLIP APPLIE 5 13 M/L LIGAMAX5 (MISCELLANEOUS) IMPLANT
CLIP APPLIE ROT 10 11.4 M/L (STAPLE) IMPLANT
CLOSURE WOUND 1/2 X4 (GAUZE/BANDAGES/DRESSINGS) ×1
COVER MAYO STAND STRL (DRAPES) IMPLANT
COVER SURGICAL LIGHT HANDLE (MISCELLANEOUS) IMPLANT
DECANTER SPIKE VIAL GLASS SM (MISCELLANEOUS) ×3 IMPLANT
DERMABOND ADVANCED (GAUZE/BANDAGES/DRESSINGS) ×2
DERMABOND ADVANCED .7 DNX12 (GAUZE/BANDAGES/DRESSINGS) ×1 IMPLANT
DRAPE LAPAROSCOPIC ABDOMINAL (DRAPES) ×3 IMPLANT
DRAPE WARM FLUID 44X44 (DRAPE) IMPLANT
DRSG TEGADERM 2-3/8X2-3/4 SM (GAUZE/BANDAGES/DRESSINGS) ×6 IMPLANT
DRSG TEGADERM 4X4.75 (GAUZE/BANDAGES/DRESSINGS) ×3 IMPLANT
ELECT PENCIL ROCKER SW 15FT (MISCELLANEOUS) ×3 IMPLANT
ELECT REM PT RETURN 9FT ADLT (ELECTROSURGICAL) ×3
ELECTRODE REM PT RTRN 9FT ADLT (ELECTROSURGICAL) ×1 IMPLANT
FILTER SMOKE EVAC LAPAROSHD (FILTER) IMPLANT
GAUZE SPONGE 4X4 12PLY STRL (GAUZE/BANDAGES/DRESSINGS) ×6 IMPLANT
GLOVE BIO SURGEON STRL SZ7 (GLOVE) ×3 IMPLANT
GLOVE BIO SURGEON STRL SZ7.5 (GLOVE) ×3 IMPLANT
GLOVE BIOGEL M 7.0 STRL (GLOVE) ×3 IMPLANT
GLOVE BIOGEL M STRL SZ7.5 (GLOVE) IMPLANT
GLOVE BIOGEL PI IND STRL 7.0 (GLOVE) ×1 IMPLANT
GLOVE BIOGEL PI INDICATOR 7.0 (GLOVE) ×2
GLOVE INDICATOR 8.0 STRL GRN (GLOVE) ×3 IMPLANT
GOWN STRL REUS W/ TWL XL LVL3 (GOWN DISPOSABLE) ×1 IMPLANT
GOWN STRL REUS W/TWL LRG LVL3 (GOWN DISPOSABLE) ×6 IMPLANT
GOWN STRL REUS W/TWL XL LVL3 (GOWN DISPOSABLE) ×5 IMPLANT
KIT BASIN OR (CUSTOM PROCEDURE TRAY) ×3 IMPLANT
LIGASURE IMPACT 36 18CM CVD LR (INSTRUMENTS) IMPLANT
LIQUID BAND (GAUZE/BANDAGES/DRESSINGS) IMPLANT
NS IRRIG 1000ML POUR BTL (IV SOLUTION) ×3 IMPLANT
SCISSORS LAP 5X35 DISP (ENDOMECHANICALS) ×3 IMPLANT
SET IRRIG TUBING LAPAROSCOPIC (IRRIGATION / IRRIGATOR) IMPLANT
SHEARS HARMONIC ACE PLUS 36CM (ENDOMECHANICALS) IMPLANT
SOLUTION ANTI FOG 6CC (MISCELLANEOUS) ×3 IMPLANT
SPONGE LAP 18X18 X RAY DECT (DISPOSABLE) IMPLANT
STAPLER VISISTAT 35W (STAPLE) IMPLANT
STRIP CLOSURE SKIN 1/2X4 (GAUZE/BANDAGES/DRESSINGS) ×2 IMPLANT
SUCTION POOLE TIP (SUCTIONS) IMPLANT
SUT MNCRL AB 4-0 PS2 18 (SUTURE) ×3 IMPLANT
SUT NOVA 0 T19/GS 22DT (SUTURE) ×3 IMPLANT
SUT PDS AB 1 TP1 96 (SUTURE) IMPLANT
SUT PROLENE 2 0 KS (SUTURE) IMPLANT
SUT PROLENE 2 0 SH DA (SUTURE) IMPLANT
SUT SILK 2 0 (SUTURE)
SUT SILK 2 0 SH CR/8 (SUTURE) IMPLANT
SUT SILK 2-0 18XBRD TIE 12 (SUTURE) IMPLANT
SUT SILK 3 0 (SUTURE)
SUT SILK 3 0 SH CR/8 (SUTURE) IMPLANT
SUT SILK 3-0 18XBRD TIE 12 (SUTURE) IMPLANT
SUT VIC AB 3-0 SH 18 (SUTURE) ×3 IMPLANT
SYS LAPSCP GELPORT 120MM (MISCELLANEOUS)
SYSTEM LAPSCP GELPORT 120MM (MISCELLANEOUS) IMPLANT
TOWEL OR 17X26 10 PK STRL BLUE (TOWEL DISPOSABLE) ×3 IMPLANT
TRAY FOLEY W/METER SILVER 14FR (SET/KITS/TRAYS/PACK) ×3 IMPLANT
TRAY LAPAROSCOPIC (CUSTOM PROCEDURE TRAY) ×3 IMPLANT
TROCAR BLADELESS OPT 5 75 (ENDOMECHANICALS) ×3 IMPLANT
TROCAR XCEL 12X100 BLDLESS (ENDOMECHANICALS) IMPLANT
TROCAR XCEL BLUNT TIP 100MML (ENDOMECHANICALS) IMPLANT
TROCAR XCEL NON-BLD 11X100MML (ENDOMECHANICALS) IMPLANT
TROCAR XCEL UNIV SLVE 11M 100M (ENDOMECHANICALS) IMPLANT
TUBING INSUFFLATION 10FT LAP (TUBING) ×3 IMPLANT
YANKAUER SUCT BULB TIP 10FT TU (MISCELLANEOUS) IMPLANT
YANKAUER SUCT BULB TIP NO VENT (SUCTIONS) IMPLANT

## 2014-12-03 NOTE — Progress Notes (Signed)
O.K. For patient to go to Short Stay when ready.-per Dr. Marcell Barlow

## 2014-12-03 NOTE — Anesthesia Preprocedure Evaluation (Addendum)
Anesthesia Evaluation  Patient identified by MRN, date of birth, ID band Patient awake    Reviewed: Allergy & Precautions, NPO status , Patient's Chart, lab work & pertinent test results  Airway Mallampati: II  TM Distance: >3 FB Neck ROM: Full    Dental  (+) Poor Dentition,    Pulmonary sleep apnea and Continuous Positive Airway Pressure Ventilation , former smoker,  breath sounds clear to auscultation  Pulmonary exam normal       Cardiovascular negative cardio ROS Normal cardiovascular examRhythm:Regular Rate:Normal     Neuro/Psych  Headaches, PSYCHIATRIC DISORDERS Anxiety Depression  Neuromuscular disease    GI/Hepatic negative GI ROS, Neg liver ROS,   Endo/Other  diabetes (gestational)Morbid obesity  Renal/GU negative Renal ROS  negative genitourinary   Musculoskeletal  (+) Arthritis -,   Abdominal (+) + obese,   Peds negative pediatric ROS (+)  Hematology  (+) Blood dyscrasia (lupus anticoagulant during pregnancy), anemia ,   Anesthesia Other Findings 2015 Echo reviewed - EF normal, LVH and pericardial fatty infiltration - only required anticoagulation for lupus anticoagulant disease during pregnancy, no issues since, + smoking as recently as yesterday, no diabetes   Reproductive/Obstetrics negative OB ROS                            Anesthesia Physical  Anesthesia Plan  ASA: III  Anesthesia Plan: General   Post-op Pain Management:    Induction: Intravenous  Airway Management Planned: Oral ETT  Additional Equipment:   Intra-op Plan:   Post-operative Plan: Extubation in OR  Informed Consent: I have reviewed the patients History and Physical, chart, labs and discussed the procedure including the risks, benefits and alternatives for the proposed anesthesia with the patient or authorized representative who has indicated his/her understanding and acceptance.   Dental advisory  given  Plan Discussed with: Anesthesiologist and CRNA  Anesthesia Plan Comments:         Anesthesia Quick Evaluation

## 2014-12-03 NOTE — Brief Op Note (Signed)
12/03/2014  10:20 AM  PATIENT:  Kathleen Walsh  34 y.o. female  PRE-OPERATIVE DIAGNOSIS:  abdominal wall seroma/lipoma, supraumbilical pain  POST-OPERATIVE DIAGNOSIS:  Supraumbilical pain; abdominal wall lipoma; intra-abdominal adhesions  PROCEDURE:  Procedure(s): LAPAROSCOPY DIAGNOSTIC WITH LYSIS OF ADHESIONS (N/A) EXCISION OF ABDOMINAL WALL LIPOMA (N/A)  SURGEON:  Surgeon(s) and Role:    * Greer Pickerel, MD - Primary  PHYSICIAN ASSISTANT:   ASSISTANTS: none   ANESTHESIA:   general  EBL:  Total I/O In: 1800 [I.V.:1800] Out: 125 [Urine:125]  BLOOD ADMINISTERED:none  DRAINS: none   LOCAL MEDICATIONS USED:  MARCAINE     SPECIMEN:  Source of Specimen:  lipoma  DISPOSITION OF SPECIMEN:  PATHOLOGY  COUNTS:  YES  TOURNIQUET:  * No tourniquets in log *  DICTATION: .Other Dictation: Dictation Number [id number]  PLAN OF CARE: Discharge to home after PACU  PATIENT DISPOSITION:  PACU - hemodynamically stable.   Delay start of Pharmacological VTE agent (>24hrs) due to surgical blood loss or risk of bleeding: no

## 2014-12-03 NOTE — H&P (View-Only) (Signed)
Kathleen Walsh. Shiflet 11/12/2014 10:57 AM Location: [facility] Patient #: [id number] DOB: November 14, 1980 Married / Language: Cleophus Molt / Race: White Female  History of Present Illness [name] M.  MD; 11/12/2014 11:22 AM) Patient words: reck.  The patient is a 34 year old female who presents with non-malignant abdominal pain. She comes back in complaining of recurrent periumbilical pain and tenderness. She actually saw Dr. Dalbert Batman late last week. He gave her refill on pain medication. She states that she had been doing better until she had recurrent periumbilical pain as well as a feeling of a lump in that area last week. It is now interfering with her work. Brief history: She underwent a laparoscopic ventral hernia repair with mesh in May 2012. I used a 15 x 20 cm piece of parietex mesh. She had a fair amount of pain postoperatively and was actually readmitted and had a CT scan done after surgery. At that time she was found to have some retained fat within the hernia sac. She states she had been doing well until sometime last week when she developed some pain around her belly button and on the right side. She also reported nausea and vomiting. She denies any fever or chills. She is able to tolerate liquids. She ended up going to the emergency room yesterday and underwent labs and a CT scan. Her blood work was normal. There was no elevated white blood cell count. She was placed on Keflex and given a prescription for Zofran and Percocet. She denies any trauma to the area. She did have a C-section in October 2014. She is still smoking about 5 cigarettes a day. She also reports some constipation.   Problem List/Past Medical [name] Ronnie Derby, MD; 11/12/2014 11:22 AM) ACUTE PERIUMBILICAL PAIN (283.66  Q94.76)  Other Problems [name], MD; 11/12/2014 11:22 AM) Ventral Hernia Repair Sleep Apnea Diabetes Mellitus Gastroesophageal Reflux Disease Migraine  Headache Depression Back Pain  Past Surgical History [name], MD; 11/12/2014 11:22 AM) Foot Surgery Left. Oral Surgery Resection of Stomach Cesarean Section - Multiple  Diagnostic Studies History [name], MD; 11/12/2014 11:22 AM) Colonoscopy 5-10 years ago Mammogram never Pap Smear 1-5 years ago  Allergies Marjean Donna, [place]; 11/12/2014 10:58 AM) No Known Drug Allergies06/16/2016  Medication History ([name], CMA; 11/12/2014 10:58 AM) Zofran ODT (4MG  Tablet Disperse, 1 (one) Oral every four hours, as needed, Taken starting 10/16/2014) Active. Percocet (5-325MG  Tablet, 1 (one) Tablet Oral every four hours, as needed, Taken starting 11/10/2014) Active. Ondansetron HCl (4MG  Tablet, Oral) Active. Furosemide (20MG  Tablet, Oral) Active. Cymbalta (20MG  Capsule DR Part, Oral) Active. DULoxetine HCl (60MG  Capsule DR Part, Oral) Active. Medications Reconciled  Social History [name], MD; 11/12/2014 11:22 AM) Alcohol use Remotely quit alcohol use. Caffeine use Carbonated beverages, Tea. No drug use Tobacco use Current every day smoker.  Family History [name], MD; 11/12/2014 11:22 AM) Diabetes Mellitus Father, Mother, Sister. Hypertension Father. Cerebrovascular Accident Family Members In General, Father. Arthritis Father. Breast Cancer Family Members In General. Cancer Family Members In General.  Pregnancy / Birth History [name], MD; 11/12/2014 11:22 AM) Maternal age 6-25 Para 2 Irregular periods Age at menarche 66 years. Gravida 4  Vitals ([name] CMA; 11/12/2014 10:57 AM) 11/12/2014 10:57 AM Weight: 293 lb Height: 66in Body Surface Area: 2.49 m Body Mass Index: 47.29 kg/m Temp.: 97.52F(Temporal)  Pulse: 80 (Regular)  BP: 136/76 (Sitting, Left Arm, Standard)    Physical Exam [name] M.  MD; 11/12/2014  11:18 AM) General Mental Status-Alert. General Appearance-Consistent with  stated age. Hydration-Well hydrated. Voice-Normal. Note: morbidly obese   Head and Neck Head-normocephalic, atraumatic with no lesions or palpable masses. Trachea-midline. Thyroid Gland Characteristics - normal size and consistency.  Eye Eyeball - Bilateral-Extraocular movements intact. Sclera/Conjunctiva - Bilateral-No scleral icterus.  Chest and Lung Exam Chest and lung exam reveals -quiet, even and easy respiratory effort with no use of accessory muscles and on auscultation, normal breath sounds, no adventitious sounds and normal vocal resonance. Inspection Chest Wall - Normal. Back - normal.  Breast - Did not examine.  Cardiovascular Cardiovascular examination reveals -normal heart sounds, regular rate and rhythm with no murmurs and normal pedal pulses bilaterally.  Abdomen Inspection Inspection of the abdomen reveals - No Hernias. Skin - Scar - Note: well healed lateral trocar sites. Palpation/Percussion Palpation and Percussion of the abdomen reveal - Soft, No Rebound tenderness, No Rigidity (guarding) and No hepatosplenomegaly. Auscultation Auscultation of the abdomen reveals - Bowel sounds normal. Note: obese, in supraumbilical position, there is a soft tissue lump/mass that is TTP. no cellulitis/induration/fluctuance   Peripheral Vascular Upper Extremity Palpation - Pulses bilaterally normal.  Neurologic Neurologic evaluation reveals -alert and oriented x 3 with no impairment of recent or remote memory. Mental Status-Normal.  Neuropsychiatric The patient's mood and affect are described as -normal. Judgment and Insight-insight is appropriate concerning matters relevant to self.  Musculoskeletal Normal Exam - Left-Upper Extremity Strength Normal and Lower Extremity Strength Normal. Normal Exam - Right-Upper Extremity Strength Normal and Lower Extremity Strength Normal.  Lymphatic Head & Neck  General Head & Neck Lymphatics:  Bilateral - Description - Normal. Axillary - Did not examine. Femoral & Inguinal - Did not examine.    Assessment & Plan [name] M.  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 We discussed risks and benefits of surgery including but not limited to bleeding, infection, injury to other structures, blood clot formation, ileus, nonhealing wound, mesh infection, need for additional procedures, possible abdominal wall hernia repair, failure to resolve her pain, and anesthesia issues. I advised her strongly to stop smoking in order to decrease her risk of wound infection and cardiac and pulmonary issues perioperatively. She agrees to the plan Current Plans  Schedule for Surgery  Leighton Ruff. Redmond Pulling, MD, FACS General, Bariatric, & Minimally Invasive Surgery [facility], [place]

## 2014-12-03 NOTE — Interval H&P Note (Signed)
History and Physical Interval Note:  12/03/2014 8:18 AM  Kathleen Walsh  has presented today for surgery, with the diagnosis of abdominal wall seroma lipoma  The various methods of treatment have been discussed with the patient and family. 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  The patient's history has been reviewed, patient examined, no change in status, stable for surgery.  I have reviewed the patient's chart and labs.  Questions were answered to the patient's satisfaction.    rediscussed surgical plan with pt.  All questions asked and answered She has continued to smoke intermittently  Leighton Ruff. Redmond Pulling, MD, [place], Bariatric, & Minimally Invasive Surgery [facility], [place]    [facility] M

## 2014-12-03 NOTE — Progress Notes (Addendum)
Patient up off stretcher after diagnostic laparoscopy to bathroom. Back to recliner to help patient cough and deep breath. Patient tolerated well.

## 2014-12-03 NOTE — Anesthesia Postprocedure Evaluation (Signed)
  Anesthesia Post-op Note  Patient: Kathleen Walsh  Procedure(s) Performed: Procedure(s) (LRB): LAPAROSCOPY DIAGNOSTIC WITH LYSIS OF ADHESIONS (N/A) INCISION OF ABDOMINAL WALL LIPOMA (N/A)  Patient Location: PACU  Anesthesia Type: General  Level of Consciousness: awake and alert   Airway and Oxygen Therapy: Patient Spontanous Breathing  Post-op Pain: mild  Post-op Assessment: Post-op Vital signs reviewed, Patient's Cardiovascular Status Stable, Respiratory Function Stable, Patent Airway and No signs of Nausea or vomiting  Last Vitals:  Filed Vitals:   12/03/14 1355  BP: 130/84  Pulse: 86  Temp:   Resp: 16    Post-op Vital Signs: stable   Complications: No apparent anesthesia complications

## 2014-12-03 NOTE — Discharge Instructions (Signed)
[facility] LAPAROS[facility]: POST OP INSTRUCTIONS Always review your discharge instruction sheet given to you by the facility where your surgery was performed. IF YOU HAVE DISABILITY OR FAMILY LEAVE FORMS, YOU MUST BRING THEM TO THE OFFICE FOR PROCESSING.   DO NOT GIVE THEM TO YOUR DOCTOR.  1. A prescription for pain medication may be given to you upon discharge.  Take your pain medication as prescribed, if needed.  If narcotic pain medicine is not needed, then you may take acetaminophen (Tylenol) or ibuprofen (Advil) as needed. 2. Take your usually prescribed medications unless otherwise directed. 3. If you need a refill on your pain medication, please contact your pharmacy.  They will contact our office to request authorization. Prescriptions will not be filled after 5pm or on week-ends. 4. You should follow a light diet the first few days after arrival home, such as soup and crackers, etc.  Be sure to include lots of fluids daily. 5. Most patients will experience some swelling and bruising in the area of the incisions.  Ice packs will help.  Swelling and bruising can take several days to resolve.  6. It is common to experience some constipation if taking pain medication after surgery.  Increasing fluid intake and taking a stool softener (such as Colace) will usually help or prevent this problem from occurring.  A mild laxative (Milk of Magnesia or Miralax) should be taken according to package instructions if there are no bowel movements after 48 hours. 7. Unless discharge instructions indicate otherwise, you may remove your bandages 48 hours after surgery, and you may shower at that time.  You have steri-strips (small skin tapes) in place directly over the incision.  These strips should be left on the skin for 7-10 days.   Any sutures or staples will be removed at the office during your follow-up visit. 8. ACTIVITIES:  You may resume regular (light) daily activities  beginning the next day--such as daily self-care, walking, climbing stairs--gradually increasing activities as tolerated.  You may have sexual intercourse when it is comfortable.  Refrain from any heavy lifting or straining until approved by your doctor. a. You may drive when you are no longer taking prescription pain medication, you can comfortably wear a seatbelt, and you can safely maneuver your car and apply brakes. 9. You should see your doctor in the office for a follow-up appointment approximately 2-3 weeks after your surgery.  Make sure that you call for this appointment within a day or two after you arrive home to insure a convenient appointment time. 10. OTHER INSTRUCTIONS:  WHEN TO CALL YOUR DOCTOR: 1. Fever over 101.0 2. Inability to urinate 3. Continued bleeding from incision. 4. Increased pain, redness, or drainage from the incision. 5. Increasing abdominal pain  The clinic staff is available to answer your questions during regular business hours.  Please dont hesitate to call and ask to speak to one of the nurses for clinical concerns.  If you have a medical emergency, go to the nearest emergency room or call 911.  A surgeon from [facility] is always on call at the hospital. [address], [place], [place], [place]  [address] ? [address], [place], [place]   [phone] ? [phone] ? FAX [phone] Web site: [email or web address]   General Anesthesia, Care After Refer to this sheet in the next few weeks. These instructions provide you with information on caring for yourself after your procedure. Your health care provider may  also give you more specific instructions. Your treatment has been planned according to current medical practices, but problems sometimes occur. Call your health care provider if you have any problems or questions after your procedure. WHAT TO EXPECT AFTER THE PROCEDURE After the procedure, it is typical to  experience:  Sleepiness.  Nausea and vomiting. HOME CARE INSTRUCTIONS  For the first 24 hours after general anesthesia:  Have a responsible person with you.  Do not drive a car. If you are alone, do not take public transportation.  Do not drink alcohol.  Do not take medicine that has not been prescribed by your health care provider.  Do not sign important papers or make important decisions.  You may resume a normal diet and activities as directed by your health care provider.  Change bandages (dressings) as directed.  If you have questions or problems that seem related to general anesthesia, call the hospital and ask for the anesthetist or anesthesiologist on call. SEEK MEDICAL CARE IF:  You have nausea and vomiting that continue the day after anesthesia.  You develop a rash. SEEK IMMEDIATE MEDICAL CARE IF:   You have difficulty breathing.  You have chest pain.  You have any allergic problems. Document Released: 07/25/2000 Document Revised: 04/23/2013 Document Reviewed: 11/01/2012 [facility] Patient Information 2015 [place], [place]. This information is not intended to replace advice given to you by your health care provider. Make sure you discuss any questions you have with your health care provider.   Remove scopalamine patch 12/04/14. Do not get any ointment from the patch on your fingers. Wash skin with soap and water after wards. Place patch in a ziplock bag and throw away. Keep used patch away from children and animals.

## 2014-12-03 NOTE — Anesthesia Procedure Notes (Signed)
Procedure Name: Intubation Date/Time: 12/03/2014 8:43 AM Performed by: Ofilia Neas Pre-anesthesia Checklist: Patient identified, Emergency Drugs available, Suction available, Patient being monitored and Timeout performed Patient Re-evaluated:Patient Re-evaluated prior to inductionOxygen Delivery Method: Circle system utilized Preoxygenation: Pre-oxygenation with 100% oxygen Intubation Type: IV induction, Cricoid Pressure applied and Rapid sequence Laryngoscope Size: Mac and 4 Grade View: Grade I Tube type: Oral Tube size: 7.5 mm Number of attempts: 1 Airway Equipment and Method: Stylet Placement Confirmation: ETT inserted through vocal cords under direct vision,  positive ETCO2 and breath sounds checked- equal and bilateral Secured at: 21 cm Tube secured with: Tape Dental Injury: Teeth and Oropharynx as per pre-operative assessment

## 2014-12-03 NOTE — Transfer of Care (Signed)
Immediate Anesthesia Transfer of Care Note  Patient: Kathleen Walsh  Procedure(s) Performed: Procedure(s): LAPAROSCOPY DIAGNOSTIC WITH LYSIS OF ADHESIONS (N/A) INCISION OF ABDOMINAL WALL LIPOMA (N/A)  Patient Location: PACU  Anesthesia Type:General  Level of Consciousness: awake, oriented, pateint uncooperative, confused, lethargic and responds to stimulation  Airway & Oxygen Therapy: Patient Spontanous Breathing and Patient connected to face mask oxygen  Post-op Assessment: Report given to RN, Post -op Vital signs reviewed and stable and Patient moving all extremities  Post vital signs: Reviewed and stable  Last Vitals:  Filed Vitals:   12/03/14 0633  BP: 156/84  Pulse: 95  Temp: 37.6 C  Resp: 18    Complications: No apparent anesthesia complications

## 2014-12-04 ENCOUNTER — Encounter (HOSPITAL_COMMUNITY): Payer: Self-pay | Admitting: General Surgery

## 2014-12-04 NOTE — Op Note (Signed)
NAMECAMRY, Kathleen Walsh NO.:  [id number]  MEDICAL RECORD NO.:  [id number]  LOCATION:  [facility]                         FACILITY:  [facility]  PHYSICIAN:  Leighton Ruff. Redmond Pulling, MD, FACSDATE OF BIRTH:  1980-10-20  DATE OF PROCEDURE:  12/03/2014 DATE OF DISCHARGE:  12/03/2014                              OPERATIVE REPORT   PREOPERATIVE DIAGNOSIS:  Periumbilical pain, probable abdominal wall lipoma.  POSTOPERATIVE DIAGNOSES: 1. Supraumbilical abdominal wall lipoma. 2. Intraabdominal adhesions.  PROCEDURE: 1. Diagnostic laparoscopy with laparoscopic lysis of adhesions for 30     minutes. 2. Excision of supraumbilical abdominal wall lipoma.  SURGEON:  Leighton Ruff. Redmond Pulling, MD, FACS  ASSISTANT:  None.  ANESTHESIA:  General plus 0.25% Marcaine with epi.  SPECIMENS:  Supraumbilical abdominal wall lipoma.  INDICATIONS FOR PROCEDURE:  The patient is a pleasant 34 year old female, who I performed a laparoscopic ventral hernia repair on with mesh in May 2012.  She had a supraumbilical hernia.  I used a 15 x 20 cm piece of Parietex as an intraperitoneal onlay with 12 transfascial sutures.  The abdominal wall muscle was left separated, it was not closed.  She re-presented to my office about 2 months ago with pain in the supraumbilical position with swelling.  It did not feel like she had a recurrent hernia, but potentially a lipoma or potentially a retained piece of herniated fat from the time of her initial surgery.  We obtained a CT scan, which demonstrated that the mesh was intact; however, there was question of herniated fat and subcutaneous tissue. We did antibiotics and she got better.  Unfortunately, she had recurrent pain and swelling in that location and underwent another round of antibiotics and got somewhat better, but never back to baseline.  At this point, because we had tried 2 trials of conservative management, I recommended going to the operating room, doing a diagnostic  laparoscopy to ensure that the mesh was intact, that it has not become compromised and then do a cutdown in the supraumbilical position to remove possible retained lipoma or herniated fat.  We had an extensive discussion about the risks and benefits, and she elected to proceed with surgery.  DESCRIPTION OF PROCEDURE:  After obtaining informed consent, she was taken to the OR 6 at [facility], placed supine on the operating room table.  Sequential compression devices were placed. General endotracheal anesthesia was established.  A Foley catheter was placed.  Her abdomen was prepped and draped in the usual standard surgical fashion.  She received IV antibiotics prior to skin incision. I made a small incision in the left upper quadrant 2 fingerbreadths below the left subcostal margin and gained access to her abdomen using a 5-mm 0-degree laparoscope through a 5-mm trocar and gained access to her abdomen easily.  Pneumoperitoneum was completely established up to a patient pressure of 15 mmHg.  Laparoscope was advanced and the abdominal cavity was surveilled.  I could visualize the mesh.  The edges of the mesh were intact and flushed against the abdominal wall.  There was a fair amount of omental adhesions to the mesh.  A 5 mm trocar was placed in the left lateral abdominal  wall under direct visualization.  Then using combination of blunt dissection along with scissors with and without electrocautery, I stripped the adhesions from the mesh.  There was no evidence of bowel adhered to the abdominal wall.  The mesh was very well vascularized by the omentum.  We were able to clear the abdominal wall.  The mesh was intact, it was flushed, there was no rip or defect in the central portion of the mesh.  There really was no contracture of the mesh either.  At this point, I made a small vertical supraumbilical incision through the skin, divided subcutaneous tissue with electrocautery.  I then  promptly identified globular piece of fat consistent with the lipoma or had retained fat from her initial hernia procedure.  It was unclear exactly.  It was excised using electrocautery.  It had some thin avascular attachments.  The mesh was not compromised.  When it was removed, I could feel the well- encapsulated mesh.  The specimen was passed off the field.  I did irrigate the cavity with antibiotic irrigation.  I then closed what appeared to be anterior fascia in a transverse fashion over the encapsulated mesh with 4 interrupted 0-Novafil sutures.  I then went back intra-abdominally with the laparoscope and confirmed that there has been no mesh compromise and everything still looked normal.  We then reapproximated the deep dermis with inverted interrupted 3-0 Vicryls and closed the skin incision with a 4-0 Monocryl in a subcuticular fashion. Local had been infiltrated extensively around this area.  I then closed the trocar sites with 4-0 Monocryl in a subcuticular fashion.  Benzoin, Steri-Strips, and sterile bandages were applied.  The patient was extubated and brought to the recovery room in stable condition.  There were no immediate complications.  The patient tolerated the procedure well.     Leighton Ruff. Redmond Pulling, MD, FACS     EMW/MEDQ  D:  12/03/2014  T:  12/04/2014  Job:  [id number]

## 2015-06-28 ENCOUNTER — Inpatient Hospital Stay (HOSPITAL_COMMUNITY): Payer: BLUE CROSS/BLUE SHIELD

## 2015-06-28 ENCOUNTER — Encounter (HOSPITAL_COMMUNITY): Payer: Self-pay | Admitting: *Deleted

## 2015-06-28 ENCOUNTER — Inpatient Hospital Stay (HOSPITAL_COMMUNITY)
Admission: AD | Admit: 2015-06-28 | Discharge: 2015-06-28 | Disposition: A | Discharge status: Home / Self Care | Payer: BLUE CROSS/BLUE SHIELD | Source: Ambulatory Visit | Attending: Obstetrics and Gynecology | Admitting: Obstetrics and Gynecology

## 2015-06-28 DIAGNOSIS — G43909 Migraine, unspecified, not intractable, without status migrainosus: Secondary | ICD-10-CM | POA: Diagnosis not present

## 2015-06-28 DIAGNOSIS — F418 Other specified anxiety disorders: Secondary | ICD-10-CM | POA: Insufficient documentation

## 2015-06-28 DIAGNOSIS — M19072 Primary osteoarthritis, left ankle and foot: Secondary | ICD-10-CM | POA: Insufficient documentation

## 2015-06-28 DIAGNOSIS — R1030 Lower abdominal pain, unspecified: Secondary | ICD-10-CM | POA: Insufficient documentation

## 2015-06-28 DIAGNOSIS — F17210 Nicotine dependence, cigarettes, uncomplicated: Secondary | ICD-10-CM | POA: Diagnosis not present

## 2015-06-28 DIAGNOSIS — R109 Unspecified abdominal pain: Secondary | ICD-10-CM | POA: Diagnosis present

## 2015-06-28 DIAGNOSIS — R102 Pelvic and perineal pain: Secondary | ICD-10-CM

## 2015-06-28 DIAGNOSIS — N83201 Unspecified ovarian cyst, right side: Secondary | ICD-10-CM | POA: Diagnosis not present

## 2015-06-28 DIAGNOSIS — G4730 Sleep apnea, unspecified: Secondary | ICD-10-CM | POA: Insufficient documentation

## 2015-06-28 LAB — URINALYSIS, ROUTINE W REFLEX MICROSCOPIC
Bilirubin Urine: NEGATIVE
Glucose, UA: NEGATIVE mg/dL
Hgb urine dipstick: NEGATIVE
Ketones, ur: NEGATIVE mg/dL
LEUKOCYTES UA: NEGATIVE
Nitrite: NEGATIVE
PH: 6 (ref 5.0–8.0)
Protein, ur: NEGATIVE mg/dL
SPECIFIC GRAVITY, URINE: 1.015 (ref 1.005–1.030)

## 2015-06-28 LAB — POCT PREGNANCY, URINE: PREG TEST UR: NEGATIVE

## 2015-06-28 MED ORDER — OXYCODONE-ACETAMINOPHEN 5-325 MG PO TABS: 1.0000 | ORAL_TABLET | ORAL | Status: DC | PRN

## 2015-06-28 NOTE — Discharge Instructions (Signed)
Ovarian Cyst  An ovarian cyst is a sac filled with fluid or blood. This sac is attached to the ovary. Some cysts go away on their own. Other cysts need treatment.   HOME CARE   · Only take medicine as told by your doctor.  · Follow up with your doctor as told.  · Get regular pelvic exams and Pap tests.  GET HELP IF:  · Your periods are late, not regular, or painful.  · You stop having periods.  · Your belly (abdominal) or pelvic pain does not go away.  · Your belly becomes large or puffy (swollen).  · You have a hard time peeing (totally emptying your bladder).  · You have pressure on your bladder.  · You have pain during sex.  · You feel fullness, pressure, or discomfort in your belly.  · You lose weight for no reason.  · You feel sick most of the time.  · You have a hard time pooping (constipation).  · You do not feel like eating.  · You develop pimples (acne).  · You have an increase in hair on your body and face.  · You are gaining weight for no reason.  · You think you are pregnant.  GET HELP RIGHT AWAY IF:   · Your belly pain gets worse.  · You feel sick to your stomach (nauseous), and you throw up (vomit).  · You have a fever that comes on fast.  · You have belly pain while pooping (bowel movement).  · Your periods are heavier than usual.  MAKE SURE YOU:   · Understand these instructions.  · Will watch your condition.  · Will get help right away if you are not doing well or get worse.     This information is not intended to replace advice given to you by your health care provider. Make sure you discuss any questions you have with your health care provider.     Document Released: 10/05/2007 Document Revised: 02/06/2013 Document Reviewed: 12/24/2012  Elsevier Interactive Patient Education ©2016 [employer]vier Inc.

## 2015-06-28 NOTE — MAU Note (Addendum)
Pt reports pain in "ovaries" since yesterday. Pt is mostly on L side, but also hurts on the R side. She took a hydrocodone for pain @ 1100 w/ no relief. Had an ablation in the summer. Has not had a period since.

## 2015-06-28 NOTE — MAU Provider Note (Signed)
History   Lower abdominal pain. No GI or Gu symptoms. No bleeding. History of ablation.  Denies pregnancy. No nausea or vomiting.   [name]: [id number]  Arrival date & time 06/28/15  1421   None     Chief Complaint  Patient presents with  . Abdominal Pain    HPI  Past Medical History  Diagnosis Date  . Headache(784.0)     migraines  . Arthritis     left ankle  . Blood dyscrasia     lupus anticoagulant during pregnancy  . Anxiety   . Depression     takes cymbalta   . Anemia   . Sleep apnea     USES C-PAP  . Abdominal wall pain     Past Surgical History  Procedure Laterality Date  . Dilation and curettage of uterus  2004  . Foot surgery  2008  . Ventral hernia repair  09/20/10    Laparoscopic, Dr Greer Pickerel  . Hernia repair  09/19/2011  . Wisdom tooth extraction    . Cesarean section with bilateral tubal ligation Bilateral 02/11/2013    Procedure: Repeat CESAREAN SECTION WITH BILATERAL TUBAL LIGATION;  Surgeon: Lovenia Kim, MD;  Location: [facility];  Service: Obstetrics;  Laterality: Bilateral;  EDD: 03/01/13  . Tubal ligation    . Dilitation & currettage/hystroscopy with novasure ablation N/A 11/19/2014    Procedure: DILATATION & CURETTAGE/HYSTEROSCOPY WITH NOVASURE ABLATION;  Surgeon: Brien Few, MD;  Location: [facility];  Service: Gynecology;  Laterality: N/A;  . Endometrial ablation  10/20/14  . Laparoscopy N/A 12/03/2014    Procedure: LAPAROSCOPY DIAGNOSTIC WITH LYSIS OF ADHESIONS;  Surgeon: Greer Pickerel, MD;  Location: [facility];  Service: General;  Laterality: N/A;  . Debridement of abdominal wall abscess N/A 12/03/2014    Procedure: INCISION OF ABDOMINAL WALL LIPOMA;  Surgeon: Greer Pickerel, MD;  Location: [facility];  Service: General;  Laterality: N/A;    Family History  Problem Relation Age of Onset  . Diabetes Mother   . Hypertension Mother   . Diabetes Father   . Hypertension Father     Social History  Substance Use Topics  . Smoking status: Current Every Day  Smoker -- 0.30 packs/day    Types: Cigarettes    Last Attempt to Quit: 11/13/2013  . Smokeless tobacco: Never Used  . Alcohol Use: Yes     Comment: "once every three years"     OB History    Gravida Para Term Preterm AB TAB SAB Ectopic Multiple Living   4 2 2  2     2       Review of Systems  Allergies  Review of patient's allergies indicates no known allergies.  Home Medications  No current outpatient prescriptions on file.  BP 134/68 mmHg  Pulse 90  Temp(Src) 98 F (36.7 C)  Resp 18  LMP 04/28/2015 (Exact Date)  Breastfeeding? No  Physical Exam  NCAT WDWN obese wf NAD Neck : supple with FROM Abd: obese, NT. No rebound or guarding. No CVAT Ext no cords.  Neuro non focal  Pelvic : deferred. Skin: intact  MAU Course  Procedures (including critical care time) SPT neg UA neg Sono report discussed with tech and visualized. 5-6 cm simple cyst.  Labs Reviewed  URINALYSIS, ROUTINE W REFLEX MICROSCOPIC (NOT AT [facility])  POCT PREGNANCY, URINE   No results found.   1. Pain, pelvic, female       MDM  na IMP. Lower abdominal pain- simple cyst Non  surgical abdomen Expectant management DC home Pain meds given Fu sono 4-6 weeks.

## 2015-06-30 ENCOUNTER — Encounter (HOSPITAL_COMMUNITY): Payer: Self-pay | Admitting: Cardiology

## 2015-06-30 ENCOUNTER — Emergency Department (HOSPITAL_COMMUNITY)
Admission: EM | Admit: 2015-06-30 | Discharge: 2015-06-30 | Disposition: A | Discharge status: Home / Self Care | Payer: BLUE CROSS/BLUE SHIELD | Attending: Emergency Medicine | Admitting: Emergency Medicine

## 2015-06-30 ENCOUNTER — Emergency Department (HOSPITAL_COMMUNITY): Payer: BLUE CROSS/BLUE SHIELD

## 2015-06-30 DIAGNOSIS — Z9851 Tubal ligation status: Secondary | ICD-10-CM | POA: Diagnosis not present

## 2015-06-30 DIAGNOSIS — R300 Dysuria: Secondary | ICD-10-CM | POA: Diagnosis not present

## 2015-06-30 DIAGNOSIS — G4730 Sleep apnea, unspecified: Secondary | ICD-10-CM | POA: Diagnosis not present

## 2015-06-30 DIAGNOSIS — R05 Cough: Secondary | ICD-10-CM | POA: Insufficient documentation

## 2015-06-30 DIAGNOSIS — Z9889 Other specified postprocedural states: Secondary | ICD-10-CM | POA: Insufficient documentation

## 2015-06-30 DIAGNOSIS — F17210 Nicotine dependence, cigarettes, uncomplicated: Secondary | ICD-10-CM | POA: Insufficient documentation

## 2015-06-30 DIAGNOSIS — F329 Major depressive disorder, single episode, unspecified: Secondary | ICD-10-CM | POA: Insufficient documentation

## 2015-06-30 DIAGNOSIS — Z862 Personal history of diseases of the blood and blood-forming organs and certain disorders involving the immune mechanism: Secondary | ICD-10-CM | POA: Diagnosis not present

## 2015-06-30 DIAGNOSIS — R509 Fever, unspecified: Secondary | ICD-10-CM | POA: Diagnosis not present

## 2015-06-30 DIAGNOSIS — M1990 Unspecified osteoarthritis, unspecified site: Secondary | ICD-10-CM | POA: Diagnosis not present

## 2015-06-30 DIAGNOSIS — Z9981 Dependence on supplemental oxygen: Secondary | ICD-10-CM | POA: Insufficient documentation

## 2015-06-30 DIAGNOSIS — Z3202 Encounter for pregnancy test, result negative: Secondary | ICD-10-CM | POA: Insufficient documentation

## 2015-06-30 DIAGNOSIS — R1032 Left lower quadrant pain: Secondary | ICD-10-CM | POA: Diagnosis not present

## 2015-06-30 DIAGNOSIS — R109 Unspecified abdominal pain: Secondary | ICD-10-CM | POA: Diagnosis present

## 2015-06-30 LAB — CBC
HCT: 42.3 % (ref 36.0–46.0)
Hemoglobin: 12.6 g/dL (ref 12.0–15.0)
MCH: 22 pg — ABNORMAL LOW (ref 26.0–34.0)
MCHC: 29.8 g/dL — AB (ref 30.0–36.0)
MCV: 74 fL — AB (ref 78.0–100.0)
Platelets: 214 10*3/uL (ref 150–400)
RBC: 5.72 MIL/uL — ABNORMAL HIGH (ref 3.87–5.11)
RDW: 17.6 % — AB (ref 11.5–15.5)
WBC: 14.4 10*3/uL — ABNORMAL HIGH (ref 4.0–10.5)

## 2015-06-30 LAB — URINALYSIS, ROUTINE W REFLEX MICROSCOPIC
BILIRUBIN URINE: NEGATIVE
Glucose, UA: NEGATIVE mg/dL
Hgb urine dipstick: NEGATIVE
KETONES UR: NEGATIVE mg/dL
Leukocytes, UA: NEGATIVE
Nitrite: NEGATIVE
PROTEIN: NEGATIVE mg/dL
Specific Gravity, Urine: 1.012 (ref 1.005–1.030)
pH: 6 (ref 5.0–8.0)

## 2015-06-30 LAB — COMPREHENSIVE METABOLIC PANEL
ALBUMIN: 3.7 g/dL (ref 3.5–5.0)
ALT: 15 U/L (ref 14–54)
AST: 13 U/L — ABNORMAL LOW (ref 15–41)
Alkaline Phosphatase: 78 U/L (ref 38–126)
Anion gap: 11 (ref 5–15)
BILIRUBIN TOTAL: 0.3 mg/dL (ref 0.3–1.2)
BUN: 13 mg/dL (ref 6–20)
CHLORIDE: 108 mmol/L (ref 101–111)
CO2: 22 mmol/L (ref 22–32)
Calcium: 9.3 mg/dL (ref 8.9–10.3)
Creatinine, Ser: 0.56 mg/dL (ref 0.44–1.00)
GFR calc Af Amer: >60 mL/min (ref >60)
GLUCOSE: 93 mg/dL (ref 65–99)
Potassium: 4.3 mmol/L (ref 3.5–5.1)
Sodium: 141 mmol/L (ref 135–145)
TOTAL PROTEIN: 7.1 g/dL (ref 6.5–8.1)

## 2015-06-30 LAB — I-STAT BETA HCG BLOOD, ED (MC, WL, AP ONLY): I-stat hCG, quantitative: <5 m[IU]/mL (ref <5)

## 2015-06-30 MED ORDER — HYDROMORPHONE HCL 1 MG/ML IJ SOLN
1.0000 mg | Freq: Once | INTRAMUSCULAR | Status: AC
  Administered 2015-06-30: 1 mg via INTRAVENOUS
  Filled 2015-06-30: qty 1

## 2015-06-30 MED ORDER — LORAZEPAM 0.5 MG PO TABS
2.0000 mg | ORAL_TABLET | Freq: Once | ORAL | Status: AC
  Administered 2015-06-30: 2 mg via ORAL
  Filled 2015-06-30: qty 4

## 2015-06-30 MED ORDER — MORPHINE SULFATE (PF) 4 MG/ML IV SOLN
4.0000 mg | Freq: Once | INTRAVENOUS | Status: AC
  Administered 2015-06-30: 4 mg via INTRAVENOUS
  Filled 2015-06-30: qty 1

## 2015-06-30 MED ORDER — IOHEXOL 300 MG/ML  SOLN
100.0000 mL | Freq: Once | INTRAMUSCULAR | Status: AC | PRN
  Administered 2015-06-30: 100 mL via INTRAVENOUS

## 2015-06-30 MED ORDER — ONDANSETRON 4 MG PO TBDP: 4.0000 mg | ORAL_TABLET | Freq: Three times a day (TID) | ORAL | Status: DC | PRN

## 2015-06-30 NOTE — ED Provider Notes (Signed)
[name]: [id number]     Arrival date & time 06/30/15  1722 History  By signing my name below, I, [name], attest that this documentation has been prepared under the direction and in the presence of Harlene Ramus, [place]. Electronically Signed: [name], ED Scribe. 06/30/2015. 3:59 PM.    Chief Complaint  Patient presents with  . Abdominal Pain   The history is provided by the patient. No language interpreter was used.    HPI Comments: Kathleen Walsh is a 35 y.o. female who presents to the Emergency Department complaining of constant, stabbing, pulling, left-sided abdominal pain radiating to her left-sided back that began a few weeks ago. She also notes a subjective fever and cough productive with yellow sputum. She states she has not had a menstrual period since December 2016. She states she has still been able to eat and drink normally, although she notes suprapubic pressure when she urinates or defecates.  Patient states she had gone to [facility] 3 days ago and was told she had a cyst on her right ovary. She was sent home with oxycodone 325 mg, which she states does not affect the pain and "just makes me go to sleep," and was told to follow up in several weeks. However, she states she started to develop pain in the area of her left ovary that feels similar to the pain she feels on her right side. Movement and standing up straight exacerbate her pain. Patient takes Cymbalta for depression and Lasix for LE edema, but she otherwise denies a history of any medical conditions. She reports a surgical history of tubal ligation, hernia repair, Cesarean section x 2, and endometrial ablation. She denies chills, generalized myalgias, chest pain, nausea, vomiting, diarrhea. constipation, hematuria, hematochezia, vaginal bleeding, or vaginal discharge. Patient has NKDA.  Past Medical History  Diagnosis Date  . Headache(784.0)     migraines  . Arthritis     left ankle  . Blood dyscrasia     lupus  anticoagulant during pregnancy  . Anxiety   . Depression     takes cymbalta   . Anemia   . Sleep apnea     USES C-PAP  . Abdominal wall pain    Past Surgical History  Procedure Laterality Date  . Dilation and curettage of uterus  2004  . Foot surgery  2008  . Ventral hernia repair  09/20/10    Laparoscopic, Dr Greer Pickerel  . Hernia repair  09/19/2011  . Wisdom tooth extraction    . Cesarean section with bilateral tubal ligation Bilateral 02/11/2013    Procedure: Repeat CESAREAN SECTION WITH BILATERAL TUBAL LIGATION;  Surgeon: Lovenia Kim, MD;  Location: [facility];  Service: Obstetrics;  Laterality: Bilateral;  EDD: 03/01/13  . Tubal ligation    . Dilitation & currettage/hystroscopy with novasure ablation N/A 11/19/2014    Procedure: DILATATION & CURETTAGE/HYSTEROSCOPY WITH NOVASURE ABLATION;  Surgeon: Brien Few, MD;  Location: [facility];  Service: Gynecology;  Laterality: N/A;  . Endometrial ablation  10/20/14  . Laparoscopy N/A 12/03/2014    Procedure: LAPAROSCOPY DIAGNOSTIC WITH LYSIS OF ADHESIONS;  Surgeon: Greer Pickerel, MD;  Location: [facility];  Service: General;  Laterality: N/A;  . Debridement of abdominal wall abscess N/A 12/03/2014    Procedure: INCISION OF ABDOMINAL WALL LIPOMA;  Surgeon: Greer Pickerel, MD;  Location: [facility];  Service: General;  Laterality: N/A;   Family History  Problem Relation Age of Onset  . Diabetes Mother   . Hypertension Mother   .  Diabetes Father   . Hypertension Father    Social History  Substance Use Topics  . Smoking status: Current Every Day Smoker -- 0.30 packs/day    Types: Cigarettes    Last Attempt to Quit: 11/13/2013  . Smokeless tobacco: Never Used  . Alcohol Use: Yes     Comment: "once every three years"    OB History    Gravida Para Term Preterm AB TAB SAB Ectopic Multiple Living   4 2 2  2     2      Review of Systems  Constitutional: Positive for fever.  Cardiovascular: Negative for chest pain.  Gastrointestinal: Positive for  abdominal pain.  Genitourinary: Positive for dysuria. Negative for vaginal bleeding and vaginal discharge.  Musculoskeletal: Negative for myalgias.  All other systems reviewed and are negative.  Allergies  Review of patient's allergies indicates no known allergies.  Home Medications   Prior to Admission medications   Medication Sig Start Date End Date Taking? Authorizing Provider  ibuprofen (ADVIL,MOTRIN) 200 MG tablet Take 400-800 mg by mouth every 6 (six) hours as needed for moderate pain.   Yes Historical Provider, MD  oxyCODONE-acetaminophen (ROXICET) 5-325 MG tablet Take 1-2 tablets by mouth every 4 (four) hours as needed for severe pain. 06/28/15  Yes Brien Few, MD  ondansetron (ZOFRAN ODT) 4 MG disintegrating tablet Take 1 tablet (4 mg total) by mouth every 8 (eight) hours as needed for nausea or vomiting. 06/30/15   Nona Dell, PA-C   BP 138/69 mmHg  Pulse 83  Temp(Src) 98.1 F (36.7 C) (Oral)  Resp 18  Wt 140.615 kg  SpO2 97%  LMP 04/28/2015 (Exact Date) Physical Exam  Constitutional: She is oriented to person, place, and time. She appears well-developed and well-nourished.  HENT:  Head: Normocephalic and atraumatic.  Mouth/Throat: Oropharynx is clear and moist. No oropharyngeal exudate.  Eyes: Conjunctivae and EOM are normal. Right eye exhibits no discharge. Left eye exhibits no discharge. No scleral icterus.  Neck: Normal range of motion. Neck supple.  Cardiovascular: Normal rate, regular rhythm, normal heart sounds and intact distal pulses.  Exam reveals no gallop and no friction rub.   No murmur heard. Pulmonary/Chest: Effort normal and breath sounds normal. No respiratory distress. She has no wheezes. She has no rales. She exhibits no tenderness.  Abdominal: Soft. Bowel sounds are normal. She exhibits no distension and no mass. There is tenderness in the suprapubic area and left lower quadrant. There is no rigidity, no rebound, no guarding and no CVA  tenderness.  LLQ and suprapubic tenderness to palpation. No CVA tenderness. No rigidity.  Musculoskeletal: Normal range of motion. She exhibits no edema.  Lymphadenopathy:    She has no cervical adenopathy.  Neurological: She is alert and oriented to person, place, and time.  Skin: Skin is warm and dry.  Nursing note and vitals reviewed.   ED Course  Procedures (including critical care time)  DIAGNOSTIC STUDIES: Oxygen Saturation is 99% on RA, normal by my interpretation.    COORDINATION OF CARE: 6:56 PM - Discussed treatment plan with pt at bedside which includes pain medications. Pt verbalized understanding and agreed to plan.   Labs Review Labs Reviewed  COMPREHENSIVE METABOLIC PANEL - Abnormal; Notable for the following:    AST 13 (*)    All other components within normal limits  CBC - Abnormal; Notable for the following:    WBC 14.4 (*)    RBC 5.72 (*)    MCV 74.0 (*)  MCH 22.0 (*)    MCHC 29.8 (*)    RDW 17.6 (*)    All other components within normal limits  URINALYSIS, ROUTINE W REFLEX MICROSCOPIC (NOT AT [facility])  I-STAT BETA HCG BLOOD, ED ([facility], [facility], [facility] ONLY)    Imaging Review Ct Abdomen Pelvis W Contrast  06/30/2015  CLINICAL DATA:  Lower abdominal pain radiating to the low pelvis. History of an ovarian cyst. EXAM: CT ABDOMEN AND PELVIS WITH CONTRAST TECHNIQUE: Multidetector CT imaging of the abdomen and pelvis was performed using the standard protocol following bolus administration of intravenous contrast. CONTRAST:  180mL OMNIPAQUE IOHEXOL 300 MG/ML  SOLN COMPARISON:  10/14/2014 FINDINGS: Lung bases:  Essentially clear.  Heart normal in size. Liver, spleen, gallbladder, pancreas, adrenal glands:  Unremarkable. Kidneys, ureters, bladder:  Normal. Uterus and adnexa: Uterus is prominent lobulated contour and mildly heterogeneous attenuation consistent with small fibroids. There is a right adnexal, likely ovarian, 7.3 x 7.3 x 7.7 cm cyst. Left ovary/adnexa is unremarkable.  Lymph nodes:  No adenopathy. Ascites: None. Gastrointestinal: Stomach is unremarkable. 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 The duodenum extends along the right para renal fascia to be come the ileum. It does not cross to the normal ligament of Treitz position. The cecum lies in the anterior central pelvis with the ascending colon extended along the anterior mid abdomen. There is no bowel dilation to suggest obstruction or ileus. There are no inflammatory changes. No appendix is visualized. Musculoskeletal:  Unremarkable. IMPRESSION: 1. 7.7 cm right adnexal, presumed ovarian, cyst. There are no findings to suggest cyst rupture or inflammation. However, given the patient's symptoms, the possibility of ovarian torsion should be considered may warrant follow-up ultrasound with Doppler imaging. 2. No other evidence of an acute abnormality. 3. 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 Appendix not visualized, but no evidence of appendicitis. Electronically Signed   By: Lajean Manes M.D.   On: 06/30/2015 22:40   I have personally reviewed and evaluated these images and lab results as part of my medical decision-making.   EKG Interpretation None      MDM   Final diagnoses:  Left lower quadrant pain   Pt presents with left sided abdominal pain radiating to her left back with associated subjective fever and dysuria. She was recently dx with right ovarian cyst 2 days ago at [facility]. VSS. Exam revealed LLQ and suprapubic tenderness, no peritoneal signs, no CVA tenderness. Pt given pain meds in the ED. WBC 14.4. Remaining labs and urine unremarkable. Pregnancy negative. CT abdomen ordered for further evaluation due to concern for possible diverticulitis as cause of LLQ pain. Pt given ativan due to reported hx of claustrophobia and anxiety associated with CT.  CT abdomen showed 7.7cm right adnexal ovarian cyst, bowel malrotation, appendix normal. I spoke with radiologist, Dr. Autumn Patty, regarding bowel  rotation and he reports that this is a benign finding that does not require any further workup or imaging at this time. On reevaluation pt has mild LLQ tenderness, no RLQ tenderness. I do not suspect an acute abdomen at this time and do not feel that any further workup or imaging is warranted. Discussed results and plan for d/c with pt. Advised pt to continue taking her home rx for percocet as needed and to follow up with her OBGYN at her appointment.   Evaluation does not show pathology requring ongoing emergent intervention or admission. Pt is hemodynamically stable and mentating appropriately. Discussed findings/results and plan with patient/guardian, who agrees with plan. All questions answered.  Return precautions discussed and outpatient follow up given.    I personally performed the services described in this documentation, which was scribed in my presence. The recorded information has been reviewed and is accurate.     Chesley Noon [place], [place] 07/01/15 [place], DO 07/03/15 2250

## 2015-06-30 NOTE — ED Notes (Signed)
Pt reports left sided abd pain. States she was told at [facility] that she has a cyst on her right ovary. Also reports pressure when she urinates.

## 2015-06-30 NOTE — Discharge Instructions (Signed)
Take your medications as prescribed. Continue taking her prescription of Percocet as prescribed as needed for pain relief. Continue drinking fluids to remain hydrated. Follow-up with your primary care provider in 3 days if your symptoms have not resolved. I also recommend following up with her OB/GYN at your scheduled appointment. Return to the emergency department if symptoms worsen or new onset of fever, vomiting, worsening abdominal pain, vaginal bleeding, vaginal discharge, unable to tolerate fluids, diarrhea, blood in urine or stool.

## 2015-06-30 NOTE — ED Notes (Signed)
Patient transported to CT 

## 2015-06-30 NOTE — ED Notes (Signed)
Patient able to dress and ambulate independently to wheelchair

## 2015-09-03 DIAGNOSIS — R1032 Left lower quadrant pain: Secondary | ICD-10-CM | POA: Diagnosis not present

## 2015-09-28 ENCOUNTER — Emergency Department (HOSPITAL_COMMUNITY)
Admission: EM | Admit: 2015-09-28 | Discharge: 2015-09-28 | Disposition: A | Discharge status: Home / Self Care | Payer: BLUE CROSS/BLUE SHIELD | Attending: Emergency Medicine | Admitting: Emergency Medicine

## 2015-09-28 ENCOUNTER — Encounter (HOSPITAL_COMMUNITY): Payer: Self-pay

## 2015-09-28 DIAGNOSIS — Z8679 Personal history of other diseases of the circulatory system: Secondary | ICD-10-CM | POA: Diagnosis not present

## 2015-09-28 DIAGNOSIS — F17210 Nicotine dependence, cigarettes, uncomplicated: Secondary | ICD-10-CM | POA: Diagnosis not present

## 2015-09-28 DIAGNOSIS — Z79899 Other long term (current) drug therapy: Secondary | ICD-10-CM | POA: Insufficient documentation

## 2015-09-28 DIAGNOSIS — Z862 Personal history of diseases of the blood and blood-forming organs and certain disorders involving the immune mechanism: Secondary | ICD-10-CM | POA: Diagnosis not present

## 2015-09-28 DIAGNOSIS — F329 Major depressive disorder, single episode, unspecified: Secondary | ICD-10-CM | POA: Diagnosis not present

## 2015-09-28 DIAGNOSIS — R112 Nausea with vomiting, unspecified: Secondary | ICD-10-CM | POA: Diagnosis not present

## 2015-09-28 DIAGNOSIS — G4730 Sleep apnea, unspecified: Secondary | ICD-10-CM | POA: Insufficient documentation

## 2015-09-28 DIAGNOSIS — F419 Anxiety disorder, unspecified: Secondary | ICD-10-CM | POA: Diagnosis not present

## 2015-09-28 DIAGNOSIS — R1084 Generalized abdominal pain: Secondary | ICD-10-CM

## 2015-09-28 DIAGNOSIS — Z9889 Other specified postprocedural states: Secondary | ICD-10-CM | POA: Diagnosis not present

## 2015-09-28 DIAGNOSIS — Z9981 Dependence on supplemental oxygen: Secondary | ICD-10-CM | POA: Insufficient documentation

## 2015-09-28 DIAGNOSIS — Z9851 Tubal ligation status: Secondary | ICD-10-CM | POA: Insufficient documentation

## 2015-09-28 DIAGNOSIS — M19072 Primary osteoarthritis, left ankle and foot: Secondary | ICD-10-CM | POA: Diagnosis not present

## 2015-09-28 LAB — COMPREHENSIVE METABOLIC PANEL
ALT: 15 U/L (ref 14–54)
AST: 14 U/L — ABNORMAL LOW (ref 15–41)
Albumin: 3.4 g/dL — ABNORMAL LOW (ref 3.5–5.0)
Alkaline Phosphatase: 75 U/L (ref 38–126)
Anion gap: 7 (ref 5–15)
BUN: 9 mg/dL (ref 6–20)
CO2: 24 mmol/L (ref 22–32)
CREATININE: 0.55 mg/dL (ref 0.44–1.00)
Calcium: 9.1 mg/dL (ref 8.9–10.3)
Chloride: 109 mmol/L (ref 101–111)
GFR calc Af Amer: >60 mL/min (ref >60)
GFR calc non Af Amer: >60 mL/min (ref >60)
Glucose, Bld: 141 mg/dL — ABNORMAL HIGH (ref 65–99)
Potassium: 3.2 mmol/L — ABNORMAL LOW (ref 3.5–5.1)
Sodium: 140 mmol/L (ref 135–145)
Total Bilirubin: 0.5 mg/dL (ref 0.3–1.2)
Total Protein: 6.6 g/dL (ref 6.5–8.1)

## 2015-09-28 LAB — CBC
HEMATOCRIT: 43.1 % (ref 36.0–46.0)
Hemoglobin: 13.3 g/dL (ref 12.0–15.0)
MCH: 23.4 pg — AB (ref 26.0–34.0)
MCHC: 30.9 g/dL (ref 30.0–36.0)
MCV: 75.7 fL — AB (ref 78.0–100.0)
PLATELETS: 190 10*3/uL (ref 150–400)
RBC: 5.69 MIL/uL — AB (ref 3.87–5.11)
RDW: 16.9 % — ABNORMAL HIGH (ref 11.5–15.5)
WBC: 7.9 10*3/uL (ref 4.0–10.5)

## 2015-09-28 LAB — CBG MONITORING, ED: Glucose-Capillary: 129 mg/dL — ABNORMAL HIGH (ref 65–99)

## 2015-09-28 LAB — LIPASE, BLOOD: Lipase: 23 U/L (ref 11–51)

## 2015-09-28 MED ORDER — MORPHINE SULFATE (PF) 4 MG/ML IV SOLN
4.0000 mg | Freq: Once | INTRAVENOUS | Status: AC
Start: 2015-09-28 — End: 2015-09-28
  Administered 2015-09-28: 4 mg via INTRAVENOUS
  Filled 2015-09-28: qty 1

## 2015-09-28 MED ORDER — MORPHINE SULFATE (PF) 4 MG/ML IV SOLN
4.0000 mg | Freq: Once | INTRAVENOUS | Status: AC
  Administered 2015-09-28: 4 mg via INTRAVENOUS
  Filled 2015-09-28: qty 1

## 2015-09-28 MED ORDER — ONDANSETRON 4 MG PO TBDP: 4.0000 mg | ORAL_TABLET | Freq: Three times a day (TID) | ORAL | Status: DC | PRN

## 2015-09-28 MED ORDER — OXYCODONE-ACETAMINOPHEN 5-325 MG PO TABS: 1.0000 | ORAL_TABLET | ORAL | Status: DC | PRN

## 2015-09-28 MED ORDER — ONDANSETRON HCL 4 MG/2ML IJ SOLN
4.0000 mg | Freq: Once | INTRAMUSCULAR | Status: AC
  Administered 2015-09-28: 4 mg via INTRAVENOUS
  Filled 2015-09-28: qty 2

## 2015-09-28 MED ORDER — SODIUM CHLORIDE 0.9 % IV BOLUS (SEPSIS)
1000.0000 mL | Freq: Once | INTRAVENOUS | Status: AC
  Administered 2015-09-28: 1000 mL via INTRAVENOUS

## 2015-09-28 MED ORDER — ONDANSETRON HCL 4 MG/2ML IJ SOLN
4.0000 mg | Freq: Once | INTRAMUSCULAR | Status: AC
Start: 2015-09-28 — End: 2015-09-28
  Administered 2015-09-28: 4 mg via INTRAVENOUS
  Filled 2015-09-28: qty 2

## 2015-09-28 MED ORDER — DICYCLOMINE HCL 20 MG PO TABS: 20.0000 mg | ORAL_TABLET | Freq: Two times a day (BID) | ORAL | Status: DC

## 2015-09-28 MED ORDER — DICYCLOMINE HCL 10 MG PO CAPS
20.0000 mg | ORAL_CAPSULE | Freq: Once | ORAL | Status: AC
  Administered 2015-09-28: 20 mg via ORAL
  Filled 2015-09-28: qty 2

## 2015-09-28 MED ORDER — POTASSIUM CHLORIDE CRYS ER 20 MEQ PO TBCR
40.0000 meq | EXTENDED_RELEASE_TABLET | Freq: Once | ORAL | Status: AC
  Administered 2015-09-28: 40 meq via ORAL
  Filled 2015-09-28: qty 2

## 2015-09-28 NOTE — Discharge Instructions (Signed)
You have been seen today for abdominal pain and vomiting. You were also noted to have hypokalemia, or low potassium, on your blood work. It is not dangerously low, but may require supplementation. It could also be a temporary finding due to your vomiting. Your symptoms are consistent with a viral illness. Viruses do not require antibiotics. Treatment is symptomatic care. Drink plenty of fluids and get plenty of rest. You should be drinking at least a liter of water an hour to stay hydrated. Ibuprofen or Tylenol for pain or fever. Zofran for nausea. Follow up with PCP as soon as possible to reevaluate your low potassium. Return to ED should symptoms worsen.

## 2015-09-28 NOTE — ED Provider Notes (Signed)
[name]: [id number]     Arrival date & time 09/28/15  1134 History   First MD Initiated Contact with Patient 09/28/15 1240     Chief Complaint  Patient presents with  . Abdominal Pain     (Consider location/radiation/quality/duration/timing/severity/associated sxs/prior Treatment) HPI   Kathleen Walsh is a 35 y.o. female, with a history of obesity, presenting to the ED with generalized abdominal pain for the last three days. Pt also complains of N/V, with four episodes of emesis in the last 24 hours. Pt has not taken any medication for her symptoms. Rates her pain at 8/10, cramping, nonradiating. Last BM this morning. Family has had similar symptoms. Denies fever/chills, diarrhea/constipation, urinary complaints, vaginal discharge, or any other complaints. Pt has an irregular period and has been bleeding on and off for the last 4 weeks, which is normal for her.      Past Medical History  Diagnosis Date  . Headache(784.0)     migraines  . Arthritis     left ankle  . Blood dyscrasia     lupus anticoagulant during pregnancy  . Anxiety   . Depression     takes cymbalta   . Anemia   . Sleep apnea     USES C-PAP  . Abdominal wall pain    Past Surgical History  Procedure Laterality Date  . Dilation and curettage of uterus  2004  . Foot surgery  2008  . Ventral hernia repair  09/20/10    Laparoscopic, Dr Greer Pickerel  . Hernia repair  09/19/2011  . Wisdom tooth extraction    . Cesarean section with bilateral tubal ligation Bilateral 02/11/2013    Procedure: Repeat CESAREAN SECTION WITH BILATERAL TUBAL LIGATION;  Surgeon: Lovenia Kim, MD;  Location: [facility];  Service: Obstetrics;  Laterality: Bilateral;  EDD: 03/01/13  . Tubal ligation    . Dilitation & currettage/hystroscopy with novasure ablation N/A 11/19/2014    Procedure: DILATATION & CURETTAGE/HYSTEROSCOPY WITH NOVASURE ABLATION;  Surgeon: Brien Few, MD;  Location: [facility];  Service: Gynecology;  Laterality: N/A;  .  Endometrial ablation  10/20/14  . Laparoscopy N/A 12/03/2014    Procedure: LAPAROSCOPY DIAGNOSTIC WITH LYSIS OF ADHESIONS;  Surgeon: Greer Pickerel, MD;  Location: [facility];  Service: General;  Laterality: N/A;  . Debridement of abdominal wall abscess N/A 12/03/2014    Procedure: INCISION OF ABDOMINAL WALL LIPOMA;  Surgeon: Greer Pickerel, MD;  Location: [facility];  Service: General;  Laterality: N/A;   Family History  Problem Relation Age of Onset  . Diabetes Mother   . Hypertension Mother   . Diabetes Father   . Hypertension Father    Social History  Substance Use Topics  . Smoking status: Current Every Day Smoker -- 0.30 packs/day    Types: Cigarettes    Last Attempt to Quit: 11/13/2013  . Smokeless tobacco: Never Used  . Alcohol Use: No     Comment: "once every three years"    OB History    Gravida Para Term Preterm AB TAB SAB Ectopic Multiple Living   4 2 2  2     2      Review of Systems  Constitutional: Negative for fever, chills and diaphoresis.  Gastrointestinal: Positive for nausea, vomiting and abdominal pain. Negative for diarrhea, constipation and blood in stool.  Genitourinary: Negative for dysuria, hematuria, flank pain, vaginal discharge and pelvic pain.  All other systems reviewed and are negative.     Allergies  Review of patient's allergies indicates  no known allergies.  Home Medications   Prior to Admission medications   Medication Sig Start Date End Date Taking? Authorizing Provider  DULoxetine (CYMBALTA) 60 MG capsule Take 60 mg by mouth daily.   Yes Historical Provider, MD  furosemide (LASIX) 20 MG tablet Take 20 mg by mouth daily.   Yes Historical Provider, MD  dicyclomine (BENTYL) 20 MG tablet Take 1 tablet (20 mg total) by mouth 2 (two) times daily. 09/28/15    C , PA-C  ondansetron (ZOFRAN ODT) 4 MG disintegrating tablet Take 1 tablet (4 mg total) by mouth every 8 (eight) hours as needed for nausea or vomiting. 09/28/15    C , PA-C   oxyCODONE-acetaminophen (PERCOCET/ROXICET) 5-325 MG tablet Take 1-2 tablets by mouth every 4 (four) hours as needed for severe pain. 09/28/15    C , PA-C   BP 120/54 mmHg  Pulse 84  Temp(Src) 98.7 F (37.1 C) (Oral)  Resp 14  SpO2 98%  LMP 08/31/2015 Physical Exam  Constitutional: She appears well-developed and well-nourished. No distress.  HENT:  Head: Normocephalic and atraumatic.  Eyes: Conjunctivae are normal.  Neck: Neck supple.  Cardiovascular: Normal rate, regular rhythm, normal heart sounds and intact distal pulses.   Pulmonary/Chest: Effort normal and breath sounds normal. No respiratory distress.  Abdominal: Soft. There is generalized tenderness. There is no guarding.  Generalized, minor abdominal tenderness. Patient indicates tenderness verbally only.  Musculoskeletal: She exhibits no edema or tenderness.  Lymphadenopathy:    She has no cervical adenopathy.  Neurological: She is alert.  Skin: Skin is warm and dry. She is not diaphoretic.  Psychiatric: She has a normal mood and affect. Her behavior is normal.  Nursing note and vitals reviewed.   ED Course  Procedures (including critical care time) Labs Review Labs Reviewed  COMPREHENSIVE METABOLIC PANEL - Abnormal; Notable for the following:    Potassium 3.2 (*)    Glucose, Bld 141 (*)    Albumin 3.4 (*)    AST 14 (*)    All other components within normal limits  CBC - Abnormal; Notable for the following:    RBC 5.69 (*)    MCV 75.7 (*)    MCH 23.4 (*)    RDW 16.9 (*)    All other components within normal limits  CBG MONITORING, ED - Abnormal; Notable for the following:    Glucose-Capillary 129 (*)    All other components within normal limits  LIPASE, BLOOD  URINALYSIS, ROUTINE W REFLEX MICROSCOPIC (NOT AT [facility])    Imaging Review No results found. I have personally reviewed and evaluated these lab results as part of my medical decision-making.   EKG Interpretation None      MDM   Final  diagnoses:  Generalized abdominal pain  Non-intractable vomiting with nausea, vomiting of unspecified type    [name] presents with generalized abdominal cramping accompanied by vomiting for the last 3 days.  Patient's presentation is consistent with a viral illness. Patient is nontoxic appearing, afebrile, not tachycardic, not tachypneic, maintains adequate SPO2 on room air, and is in no apparent distress. Patient has no signs of sepsis or other serious or life-threatening condition.Patient improved with conservative management here in the ED. Able to pass an oral fluid challenge prior to discharge. Home care and return precautions discussed. Patient voiced understanding of these instructions and is comfortable with discharge.   Filed Vitals:   09/28/15 1615 09/28/15 1630 09/28/15 1645 09/28/15 1655  BP: 133/77 128/70 141/90 141/90  Pulse: 78 74 76 74  Temp:      TempSrc:      Resp:    18  SpO2: 97% 95% 96% 96%      Lorayne Bender, PA-C 09/28/15 1747  Daleen Bo, MD 09/29/15 [phone]

## 2015-09-28 NOTE — ED Notes (Signed)
Per Pt, Pt is coming from home. Pt reports vomiting and abdominal cramping starting on Friday. Pt denies diarrhea with symptoms.

## 2015-09-28 NOTE — ED Notes (Signed)
Patient Alert and oriented X4. Stable and ambulatory. Patient verbalized understanding of the discharge instructions.  Patient belongings were taken by the patient.  

## 2015-10-19 DIAGNOSIS — F17210 Nicotine dependence, cigarettes, uncomplicated: Secondary | ICD-10-CM | POA: Diagnosis not present

## 2015-10-19 DIAGNOSIS — F329 Major depressive disorder, single episode, unspecified: Secondary | ICD-10-CM | POA: Diagnosis not present

## 2015-10-19 DIAGNOSIS — N939 Abnormal uterine and vaginal bleeding, unspecified: Secondary | ICD-10-CM | POA: Diagnosis not present

## 2015-10-19 DIAGNOSIS — Z6841 Body Mass Index (BMI) 40.0 and over, adult: Secondary | ICD-10-CM | POA: Diagnosis not present

## 2015-10-21 DIAGNOSIS — N920 Excessive and frequent menstruation with regular cycle: Secondary | ICD-10-CM | POA: Diagnosis not present

## 2015-10-21 DIAGNOSIS — G4733 Obstructive sleep apnea (adult) (pediatric): Secondary | ICD-10-CM | POA: Diagnosis not present

## 2015-10-21 DIAGNOSIS — D6861 Antiphospholipid syndrome: Secondary | ICD-10-CM | POA: Diagnosis not present

## 2015-10-21 DIAGNOSIS — Z01818 Encounter for other preprocedural examination: Secondary | ICD-10-CM | POA: Diagnosis not present

## 2015-11-05 DIAGNOSIS — N939 Abnormal uterine and vaginal bleeding, unspecified: Secondary | ICD-10-CM | POA: Diagnosis not present

## 2015-11-11 DIAGNOSIS — N736 Female pelvic peritoneal adhesions (postinfective): Secondary | ICD-10-CM | POA: Diagnosis not present

## 2015-11-11 DIAGNOSIS — F17210 Nicotine dependence, cigarettes, uncomplicated: Secondary | ICD-10-CM | POA: Diagnosis not present

## 2015-11-11 DIAGNOSIS — N800 Endometriosis of uterus: Secondary | ICD-10-CM | POA: Diagnosis not present

## 2015-11-11 DIAGNOSIS — Z6841 Body Mass Index (BMI) 40.0 and over, adult: Secondary | ICD-10-CM | POA: Diagnosis not present

## 2015-11-11 DIAGNOSIS — N939 Abnormal uterine and vaginal bleeding, unspecified: Secondary | ICD-10-CM | POA: Diagnosis not present

## 2015-11-11 DIAGNOSIS — N72 Inflammatory disease of cervix uteri: Secondary | ICD-10-CM | POA: Diagnosis not present

## 2015-11-11 DIAGNOSIS — F329 Major depressive disorder, single episode, unspecified: Secondary | ICD-10-CM | POA: Diagnosis not present

## 2015-11-11 DIAGNOSIS — K660 Peritoneal adhesions (postprocedural) (postinfection): Secondary | ICD-10-CM | POA: Diagnosis not present

## 2015-11-11 DIAGNOSIS — Z9889 Other specified postprocedural states: Secondary | ICD-10-CM | POA: Diagnosis not present

## 2015-11-11 DIAGNOSIS — G4733 Obstructive sleep apnea (adult) (pediatric): Secondary | ICD-10-CM | POA: Diagnosis not present

## 2015-11-12 DIAGNOSIS — F329 Major depressive disorder, single episode, unspecified: Secondary | ICD-10-CM | POA: Diagnosis not present

## 2015-11-12 DIAGNOSIS — N800 Endometriosis of uterus: Secondary | ICD-10-CM | POA: Diagnosis not present

## 2015-11-12 DIAGNOSIS — Z6841 Body Mass Index (BMI) 40.0 and over, adult: Secondary | ICD-10-CM | POA: Diagnosis not present

## 2015-11-12 DIAGNOSIS — G4733 Obstructive sleep apnea (adult) (pediatric): Secondary | ICD-10-CM | POA: Diagnosis not present

## 2015-11-12 DIAGNOSIS — F17210 Nicotine dependence, cigarettes, uncomplicated: Secondary | ICD-10-CM | POA: Diagnosis not present

## 2015-11-12 DIAGNOSIS — N939 Abnormal uterine and vaginal bleeding, unspecified: Secondary | ICD-10-CM | POA: Diagnosis not present

## 2015-11-12 DIAGNOSIS — N72 Inflammatory disease of cervix uteri: Secondary | ICD-10-CM | POA: Diagnosis not present

## 2015-11-20 DIAGNOSIS — Z1389 Encounter for screening for other disorder: Secondary | ICD-10-CM | POA: Diagnosis not present

## 2015-11-20 DIAGNOSIS — R7303 Prediabetes: Secondary | ICD-10-CM | POA: Diagnosis not present

## 2015-11-20 DIAGNOSIS — Z79899 Other long term (current) drug therapy: Secondary | ICD-10-CM | POA: Diagnosis not present

## 2015-11-20 DIAGNOSIS — R600 Localized edema: Secondary | ICD-10-CM | POA: Diagnosis not present

## 2015-11-20 DIAGNOSIS — F418 Other specified anxiety disorders: Secondary | ICD-10-CM | POA: Diagnosis not present

## 2015-11-20 DIAGNOSIS — Z6841 Body Mass Index (BMI) 40.0 and over, adult: Secondary | ICD-10-CM | POA: Diagnosis not present

## 2015-11-23 ENCOUNTER — Emergency Department (HOSPITAL_COMMUNITY)
Admission: EM | Admit: 2015-11-23 | Discharge: 2015-11-23 | Disposition: A | Discharge status: Home / Self Care | Payer: BLUE CROSS/BLUE SHIELD | Attending: Emergency Medicine | Admitting: Emergency Medicine

## 2015-11-23 ENCOUNTER — Emergency Department (HOSPITAL_BASED_OUTPATIENT_CLINIC_OR_DEPARTMENT_OTHER)
Admit: 2015-11-23 | Discharge: 2015-11-23 | Disposition: A | Discharge status: Home / Self Care | Payer: BLUE CROSS/BLUE SHIELD | Attending: Emergency Medicine | Admitting: Emergency Medicine

## 2015-11-23 ENCOUNTER — Encounter (HOSPITAL_COMMUNITY): Payer: Self-pay | Admitting: *Deleted

## 2015-11-23 DIAGNOSIS — M79605 Pain in left leg: Secondary | ICD-10-CM | POA: Diagnosis not present

## 2015-11-23 DIAGNOSIS — M7989 Other specified soft tissue disorders: Secondary | ICD-10-CM

## 2015-11-23 DIAGNOSIS — M79609 Pain in unspecified limb: Secondary | ICD-10-CM | POA: Diagnosis not present

## 2015-11-23 DIAGNOSIS — F17210 Nicotine dependence, cigarettes, uncomplicated: Secondary | ICD-10-CM | POA: Diagnosis not present

## 2015-11-23 NOTE — ED Triage Notes (Signed)
PT had hysterectomy 2 weeks ago and was placed on Lovenox after because of her history with LUpus anticoagulant disorder.  Today woke up with cramping pain in left lower leg and pain in left ankle area.  Pt has tenderness and redness.  Palpable LDPP.

## 2015-11-23 NOTE — ED Provider Notes (Signed)
[facility] DEPT Provider Note   [name]: [id number] Arrival date & time: 11/23/15  1121  First Provider Contact:  None       History   Chief Complaint Chief Complaint  Patient presents with  . Leg Pain    HPI Kathleen Walsh is a 35 y.o. female.  HPI This is a 35 year old female status post hysterectomy 2 weeks ago comes in today complaining of some left lower extremity pain. She's had no known trauma to it. She has some erythema and lacy distribution on both lower extremities. She spoke with her doctor's office and they were concerned that she might have a blood clot. She has had a Doppler done here that was negative. Denies any weakness in the leg. She has no back pain. Past Medical History:  Diagnosis Date  . Abdominal wall pain   . Anemia   . Anxiety   . Arthritis    left ankle  . Blood dyscrasia    lupus anticoagulant during pregnancy  . Depression    takes cymbalta   . Headache(784.0)    migraines  . Sleep apnea    USES C-PAP    Patient Active Problem List   Diagnosis Date Noted  . Cardiomegaly 08/15/2013  . SOB (shortness of breath) 08/15/2013  . Pleuritic chest pain 08/15/2013  . Acute bronchitis 08/15/2013  . Postpartum care following cesarean delivery and tubal sterilization (10/13) 02/11/2013  . Abdominal muscle strain 12/20/2011  . Constipation 07/06/2011  . Abdominal pain 07/06/2011  . DIABETES MELLITUS, GESTATIONAL 04/17/2009  . DYSPNEA 04/17/2009    Past Surgical History:  Procedure Laterality Date  . CESAREAN SECTION WITH BILATERAL TUBAL LIGATION Bilateral 02/11/2013   Procedure: Repeat CESAREAN SECTION WITH BILATERAL TUBAL LIGATION;  Surgeon: Lovenia Kim, MD;  Location: [facility];  Service: Obstetrics;  Laterality: Bilateral;  EDD: 03/01/13  . DEBRIDEMENT OF ABDOMINAL WALL ABSCESS N/A 12/03/2014   Procedure: INCISION OF ABDOMINAL WALL LIPOMA;  Surgeon: Greer Pickerel, MD;  Location: [facility];  Service: General;  Laterality: N/A;  . DILATION AND  CURETTAGE OF UTERUS  2004  . DILITATION & CURRETTAGE/HYSTROSCOPY WITH NOVASURE ABLATION N/A 11/19/2014   Procedure: DILATATION & CURETTAGE/HYSTEROSCOPY WITH NOVASURE ABLATION;  Surgeon: Brien Few, MD;  Location: [facility];  Service: Gynecology;  Laterality: N/A;  . ENDOMETRIAL ABLATION  10/20/14  . FOOT SURGERY  2008  . HERNIA REPAIR  09/19/2011  . LAPAROSCOPY N/A 12/03/2014   Procedure: LAPAROSCOPY DIAGNOSTIC WITH LYSIS OF ADHESIONS;  Surgeon: Greer Pickerel, MD;  Location: [facility];  Service: General;  Laterality: N/A;  . TUBAL LIGATION    . VENTRAL HERNIA REPAIR  09/20/10   Laparoscopic, Dr Greer Pickerel  . WISDOM TOOTH EXTRACTION      OB History    Gravida Para Term Preterm AB Living   4 2 2   2 2    SAB TAB Ectopic Multiple Live Births                   Home Medications    Prior to Admission medications   Medication Sig Start Date End Date Taking? Authorizing Provider  dicyclomine (BENTYL) 20 MG tablet Take 1 tablet (20 mg total) by mouth 2 (two) times daily. 09/28/15   Shawn C Joy, PA-C  DULoxetine (CYMBALTA) 60 MG capsule Take 60 mg by mouth daily.    Historical Provider, MD  furosemide (LASIX) 20 MG tablet Take 20 mg by mouth daily.    Historical Provider, MD  ondansetron (ZOFRAN ODT)  4 MG disintegrating tablet Take 1 tablet (4 mg total) by mouth every 8 (eight) hours as needed for nausea or vomiting. 09/28/15   Shawn C Joy, PA-C  oxyCODONE-acetaminophen (PERCOCET/ROXICET) 5-325 MG tablet Take 1-2 tablets by mouth every 4 (four) hours as needed for severe pain. 09/28/15   Lorayne Bender, PA-C    Family History Family History  Problem Relation Age of Onset  . Diabetes Mother   . Hypertension Mother   . Diabetes Father   . Hypertension Father     Social History Social History  Substance Use Topics  . Smoking status: Current Every Day Smoker    Packs/day: 0.30    Types: Cigarettes    Last attempt to quit: 11/13/2013  . Smokeless tobacco: Never Used  . Alcohol use No     Comment:  "once every three years"      Allergies   Amoxicillin   Review of Systems Review of Systems  All other systems reviewed and are negative.    Physical Exam Updated Vital Signs BP 125/67 (BP Location: Right Arm)   Pulse 80   Temp 99 F (37.2 C) (Oral)   Resp 18   SpO2 99%   Physical Exam  Constitutional: She is oriented to person, place, and time. She appears well-developed and well-nourished. No distress.  HENT:  Head: Normocephalic and atraumatic.  Right Ear: External ear normal.  Left Ear: External ear normal.  Nose: Nose normal.  Eyes: Conjunctivae and EOM are normal. Pupils are equal, round, and reactive to light.  Neck: Normal range of motion. Neck supple.  Pulmonary/Chest: Effort normal.  Musculoskeletal: Normal range of motion.  Left lower extremity shows no evidence of obvious trauma. She is mildly diffusely tender over her ankle and foot. There is slight pinkish lacy rash that is noted on all of her extremities  Neurological: She is alert and oriented to person, place, and time. She exhibits normal muscle tone. Coordination normal.  Skin: Skin is warm and dry.  Psychiatric: She has a normal mood and affect. Her behavior is normal. Thought content normal.  Nursing note and vitals reviewed.    ED Treatments / Results  Labs (all labs ordered are listed, but only abnormal results are displayed) Labs Reviewed - No data to display  EKG  EKG Interpretation None       Radiology No results found.  Procedures Procedures (including critical care time)  Medications Ordered in ED Medications - No data to display   Initial Impression / Assessment and Plan / ED Course  I have reviewed the triage vital signs and the nursing notes.  Pertinent labs & imaging results that were available during my care of the patient were reviewed by me and considered in my medical decision making (see chart for details).  Clinical Course   Patient presents today with lower  extremity pain after recent surgery and has a negative Doppler. Have some pain in her left lower extremity with my palpation. However she has good motor and neurological function. Skin discoloration appears to be her baseline complexion. We have discussed return precautions need for follow-up and she voices understanding.  Final Clinical Impressions(s) / ED Diagnoses   Final diagnoses:  Left leg pain    New Prescriptions New Prescriptions   No medications on file     Pattricia Boss, MD 11/23/15 1907

## 2015-11-23 NOTE — Progress Notes (Signed)
VASCULAR LAB PRELIMINARY  PRELIMINARY  PRELIMINARY  PRELIMINARY  Left lower extremity venous duplex completed.    Preliminary report:  There is no DVT or SVT noted in the left lower extremity.   , , RVT 11/23/2015, 3:02 PM

## 2015-11-30 DIAGNOSIS — Z09 Encounter for follow-up examination after completed treatment for conditions other than malignant neoplasm: Secondary | ICD-10-CM | POA: Diagnosis not present

## 2016-01-20 DIAGNOSIS — G4733 Obstructive sleep apnea (adult) (pediatric): Secondary | ICD-10-CM | POA: Diagnosis not present

## 2016-01-23 ENCOUNTER — Other Ambulatory Visit: Payer: Self-pay

## 2016-01-23 ENCOUNTER — Encounter (HOSPITAL_COMMUNITY): Payer: Self-pay | Admitting: *Deleted

## 2016-01-23 DIAGNOSIS — F17210 Nicotine dependence, cigarettes, uncomplicated: Secondary | ICD-10-CM | POA: Diagnosis not present

## 2016-01-23 DIAGNOSIS — R55 Syncope and collapse: Secondary | ICD-10-CM | POA: Insufficient documentation

## 2016-01-23 DIAGNOSIS — R42 Dizziness and giddiness: Secondary | ICD-10-CM | POA: Diagnosis not present

## 2016-01-23 DIAGNOSIS — G43809 Other migraine, not intractable, without status migrainosus: Secondary | ICD-10-CM | POA: Insufficient documentation

## 2016-01-23 DIAGNOSIS — Z79899 Other long term (current) drug therapy: Secondary | ICD-10-CM | POA: Insufficient documentation

## 2016-01-23 LAB — BASIC METABOLIC PANEL
ANION GAP: 7 (ref 5–15)
BUN: 16 mg/dL (ref 6–20)
CHLORIDE: 109 mmol/L (ref 101–111)
CO2: 24 mmol/L (ref 22–32)
Calcium: 8.9 mg/dL (ref 8.9–10.3)
Creatinine, Ser: 0.66 mg/dL (ref 0.44–1.00)
GFR calc non Af Amer: >60 mL/min (ref >60)
Glucose, Bld: 116 mg/dL — ABNORMAL HIGH (ref 65–99)
POTASSIUM: 4.1 mmol/L (ref 3.5–5.1)
SODIUM: 140 mmol/L (ref 135–145)

## 2016-01-23 LAB — CBC
HEMATOCRIT: 42.7 % (ref 36.0–46.0)
HEMOGLOBIN: 13.3 g/dL (ref 12.0–15.0)
MCH: 25.2 pg — ABNORMAL LOW (ref 26.0–34.0)
MCHC: 31.1 g/dL (ref 30.0–36.0)
MCV: 81 fL (ref 78.0–100.0)
PLATELETS: 214 10*3/uL (ref 150–400)
RBC: 5.27 MIL/uL — AB (ref 3.87–5.11)
RDW: 15.6 % — ABNORMAL HIGH (ref 11.5–15.5)
WBC: 13.8 10*3/uL — AB (ref 4.0–10.5)

## 2016-01-23 NOTE — ED Triage Notes (Signed)
The pt is c/o being dizzy since yesterday am.  She fell yesterday am and this am c/o a headache also  lmp none

## 2016-01-24 ENCOUNTER — Emergency Department (HOSPITAL_COMMUNITY): Payer: BLUE CROSS/BLUE SHIELD

## 2016-01-24 ENCOUNTER — Emergency Department (HOSPITAL_COMMUNITY)
Admission: EM | Admit: 2016-01-24 | Discharge: 2016-01-24 | Disposition: A | Discharge status: Home / Self Care | Payer: BLUE CROSS/BLUE SHIELD | Attending: Emergency Medicine | Admitting: Emergency Medicine

## 2016-01-24 DIAGNOSIS — R42 Dizziness and giddiness: Secondary | ICD-10-CM

## 2016-01-24 DIAGNOSIS — R55 Syncope and collapse: Secondary | ICD-10-CM

## 2016-01-24 DIAGNOSIS — G43809 Other migraine, not intractable, without status migrainosus: Secondary | ICD-10-CM

## 2016-01-24 MED ORDER — DIPHENHYDRAMINE HCL 50 MG/ML IJ SOLN
25.0000 mg | Freq: Once | INTRAMUSCULAR | Status: AC
  Administered 2016-01-24: 25 mg via INTRAVENOUS
  Filled 2016-01-24: qty 1

## 2016-01-24 MED ORDER — DIAZEPAM 5 MG PO TABS
5.0000 mg | ORAL_TABLET | Freq: Once | ORAL | Status: AC
  Administered 2016-01-24: 5 mg via ORAL
  Filled 2016-01-24: qty 1

## 2016-01-24 MED ORDER — SODIUM CHLORIDE 0.9 % IV BOLUS (SEPSIS)
1000.0000 mL | Freq: Once | INTRAVENOUS | Status: AC
  Administered 2016-01-24: 1000 mL via INTRAVENOUS

## 2016-01-24 MED ORDER — PROCHLORPERAZINE EDISYLATE 5 MG/ML IJ SOLN
10.0000 mg | Freq: Once | INTRAMUSCULAR | Status: AC
  Administered 2016-01-24: 10 mg via INTRAVENOUS
  Filled 2016-01-24: qty 2

## 2016-01-24 MED ORDER — MECLIZINE HCL 25 MG PO TABS: 25.0000 mg | ORAL_TABLET | Freq: Three times a day (TID) | ORAL | 0 refills | Status: DC | PRN

## 2016-01-24 NOTE — ED Provider Notes (Signed)
[facility] DEPT Provider Note   [name]: [id number] Arrival date & time: 01/23/16  2313  By signing my name below, I, Maud Deed. Royston Sinner, attest that this documentation has been prepared under the direction and in the presence of Merryl Hacker, MD.  Electronically Signed: Maud Deed. Royston Sinner, ED Scribe. 01/24/16. 4:32 AM.    History   Chief Complaint Chief Complaint  Patient presents with  . Dizziness   The history is provided by the patient. No language interpreter was used.    HPI Comments: Kathleen Walsh is a 35 y.o. female with a PMHx of migraines who presents to the Emergency Department complaining of constant, unchanged lightheadedness- room spinning and dizziness x 2 days. Pt states "i blackout out and hit the floor yesterday". She reports a second episode of dizziness followed by a short episode of LOC early this morning after waking from sleep. Dizziness is made worse with movement of the head.  She also reports an ongoing HA for the past week which is not uncommon for her as she has a history of migraines. No OTC medications or home remedies attempted prior to arrival for above symptoms. No recent fever, chills, nausea, vomiting, or visual changes. Denies any history of vertigo.  PCP: Leonides Sake, MD    Past Medical History:  Diagnosis Date  . Abdominal wall pain   . Anemia   . Anxiety   . Arthritis    left ankle  . Blood dyscrasia    lupus anticoagulant during pregnancy  . Depression    takes cymbalta   . Headache(784.0)    migraines  . Sleep apnea    USES C-PAP    Patient Active Problem List   Diagnosis Date Noted  . Cardiomegaly 08/15/2013  . SOB (shortness of breath) 08/15/2013  . Pleuritic chest pain 08/15/2013  . Acute bronchitis 08/15/2013  . Postpartum care following cesarean delivery and tubal sterilization (10/13) 02/11/2013  . Abdominal muscle strain 12/20/2011  . Constipation 07/06/2011  . Abdominal pain 07/06/2011  . DIABETES MELLITUS,  GESTATIONAL 04/17/2009  . DYSPNEA 04/17/2009    Past Surgical History:  Procedure Laterality Date  . CESAREAN SECTION WITH BILATERAL TUBAL LIGATION Bilateral 02/11/2013   Procedure: Repeat CESAREAN SECTION WITH BILATERAL TUBAL LIGATION;  Surgeon: Lovenia Kim, MD;  Location: [facility];  Service: Obstetrics;  Laterality: Bilateral;  EDD: 03/01/13  . DEBRIDEMENT OF ABDOMINAL WALL ABSCESS N/A 12/03/2014   Procedure: INCISION OF ABDOMINAL WALL LIPOMA;  Surgeon: Greer Pickerel, MD;  Location: [facility];  Service: General;  Laterality: N/A;  . DILATION AND CURETTAGE OF UTERUS  2004  . DILITATION & CURRETTAGE/HYSTROSCOPY WITH NOVASURE ABLATION N/A 11/19/2014   Procedure: DILATATION & CURETTAGE/HYSTEROSCOPY WITH NOVASURE ABLATION;  Surgeon: Brien Few, MD;  Location: [facility];  Service: Gynecology;  Laterality: N/A;  . ENDOMETRIAL ABLATION  10/20/14  . FOOT SURGERY  2008  . HERNIA REPAIR  09/19/2011  . LAPAROSCOPY N/A 12/03/2014   Procedure: LAPAROSCOPY DIAGNOSTIC WITH LYSIS OF ADHESIONS;  Surgeon: Greer Pickerel, MD;  Location: [facility];  Service: General;  Laterality: N/A;  . TUBAL LIGATION    . VENTRAL HERNIA REPAIR  09/20/10   Laparoscopic, Dr Greer Pickerel  . WISDOM TOOTH EXTRACTION      OB History    Gravida Para Term Preterm AB Living   4 2 2   2 2    SAB TAB Ectopic Multiple Live Births           1  Home Medications    Prior to Admission medications   Medication Sig Start Date End Date Taking? Authorizing Provider  DULoxetine (CYMBALTA) 60 MG capsule Take 60 mg by mouth daily.   Yes Historical Provider, MD  furosemide (LASIX) 20 MG tablet Take 20 mg by mouth daily.   Yes Historical Provider, MD  dicyclomine (BENTYL) 20 MG tablet Take 1 tablet (20 mg total) by mouth 2 (two) times daily. Patient not taking: Reported on 01/24/2016 09/28/15   Helane Gunther Joy, PA-C  meclizine (ANTIVERT) 25 MG tablet Take 1 tablet (25 mg total) by mouth 3 (three) times daily as needed for dizziness. 01/24/16   Merryl Hacker, MD  ondansetron (ZOFRAN ODT) 4 MG disintegrating tablet Take 1 tablet (4 mg total) by mouth every 8 (eight) hours as needed for nausea or vomiting. Patient not taking: Reported on 01/24/2016 09/28/15   Lorayne Bender, PA-C  oxyCODONE-acetaminophen (PERCOCET/ROXICET) 5-325 MG tablet Take 1-2 tablets by mouth every 4 (four) hours as needed for severe pain. Patient not taking: Reported on 01/24/2016 09/28/15   Lorayne Bender, PA-C    Family History Family History  Problem Relation Age of Onset  . Diabetes Mother   . Hypertension Mother   . Diabetes Father   . Hypertension Father     Social History Social History  Substance Use Topics  . Smoking status: Current Every Day Smoker    Packs/day: 0.30    Types: Cigarettes    Last attempt to quit: 11/13/2013  . Smokeless tobacco: Never Used  . Alcohol use No     Comment: "once every three years"      Allergies   Amoxicillin   Review of Systems Review of Systems  Constitutional: Negative for chills and fever.  Eyes: Negative for visual disturbance.  Gastrointestinal: Negative for nausea and vomiting.  Neurological: Positive for dizziness, light-headedness and headaches.  All other systems reviewed and are negative.    Physical Exam Updated Vital Signs BP 146/73   Pulse 75   Temp 98.4 F (36.9 C)   Resp (!) 29   Ht 5\' 3"  (1.6 m)   Wt (!) 303 lb (137.4 kg)   SpO2 99%   BMI 53.67 kg/m   Physical Exam  Constitutional: She is oriented to person, place, and time. She appears well-developed and well-nourished.  Obese  HENT:  Head: Normocephalic and atraumatic.  Eyes: EOM are normal. Pupils are equal, round, and reactive to light.  No nystagmus  Cardiovascular: Normal rate, regular rhythm and normal heart sounds.   Pulmonary/Chest: Effort normal and breath sounds normal. No respiratory distress. She has no wheezes.  Abdominal: Soft. Bowel sounds are normal. There is tenderness. There is no guarding.  Neurological: She is  alert and oriented to person, place, and time.  Cranial nerves II through XII intact, 5 out of 5 strength in all 4 extremities, no dysmetria to finger-nose-finger  Skin: Skin is warm and dry.  Psychiatric: She has a normal mood and affect.  Nursing note and vitals reviewed.    ED Treatments / Results   DIAGNOSTIC STUDIES: Oxygen Saturation is 100% on RA, Normal by my interpretation.    COORDINATION OF CARE: 12:39 AM- Will order blood work and EKG. Will give fluids, Valium, Benadryl, and Compazine. Discussed treatment plan with pt at bedside and pt agreed to plan.     Labs (all labs ordered are listed, but only abnormal results are displayed) Labs Reviewed  BASIC METABOLIC PANEL - Abnormal; Notable  for the following:       Result Value   Glucose, Bld 116 (*)    All other components within normal limits  CBC - Abnormal; Notable for the following:    WBC 13.8 (*)    RBC 5.27 (*)    MCH 25.2 (*)    RDW 15.6 (*)    All other components within normal limits    EKG  EKG Interpretation  Date/Time:  Saturday January 23 2016 23:23:28 EDT Ventricular Rate:  85 PR Interval:  162 QRS Duration: 86 QT Interval:  376 QTC Calculation: 447 R Axis:   83 Text Interpretation:  Normal sinus rhythm Nonspecific ST abnormality Abnormal ECG Confirmed by Dina Rich  MD, COURTNEY ([id number]) on 01/24/2016 12:08:26 AM       Radiology Ct Head Wo Contrast  Result Date: 01/24/2016 CLINICAL DATA:  Multiple dizzy spells today. Loss of consciousness for 2 episodes. Nausea and vomiting. EXAM: CT HEAD WITHOUT CONTRAST TECHNIQUE: Contiguous axial images were obtained from the base of the skull through the vertex without intravenous contrast. COMPARISON:  None. FINDINGS: Brain: No evidence of acute infarction, hemorrhage, hydrocephalus, extra-axial collection or mass lesion/mass effect. Vascular: No hyperdense vessel or unexpected calcification. Skull: Normal. Negative for fracture or focal lesion.  Sinuses/Orbits: Mucosal thickening in the maxillary antra bilaterally. Opacification of multiple bilateral ethmoid air cells. Mucosal thickening in the frontal sinuses. No acute air-fluid levels. Mastoid air cells are not opacified. Other: None. IMPRESSION: No acute intracranial abnormalities. Inflammatory changes suggested in the paranasal sinuses. Electronically Signed   By: Lucienne Capers M.D.   On: 01/24/2016 03:47    Procedures Procedures (including critical care time)  Medications Ordered in ED Medications  sodium chloride 0.9 % bolus 1,000 mL (0 mLs Intravenous Stopped 01/24/16 0304)  diazepam (VALIUM) tablet 5 mg (5 mg Oral Given 01/24/16 0107)  prochlorperazine (COMPAZINE) injection 10 mg (10 mg Intravenous Given 01/24/16 0107)  diphenhydrAMINE (BENADRYL) injection 25 mg (25 mg Intravenous Given 01/24/16 0107)     Initial Impression / Assessment and Plan / ED Course  I have reviewed the triage vital signs and the nursing notes.  Pertinent labs & imaging results that were available during my care of the patient were reviewed by me and considered in my medical decision making (see chart for details).  Clinical Course    Patient presents with headache, dizziness, episode of syncope. Nontoxic. Afebrile. Vital signs reassuring. History most suggestive of vertigo. She does report a fall and hitting her head but also has a history of migraines. No known history of syncope. EKG is reassuring. No obvious arrhythmias. Patient was given migraine cocktail and Valium for vertigo. Lab work obtained and reassuring. CT head negative for acute traumatic injury. Also no evidence of bleed. On recheck, patient states that she feels better. She is ambulatory without difficulty. This may all be related to her known migraines. However, follow-up with PCP and cardiology recommended given onset of syncope.  After history, exam, and medical workup I feel the patient has been appropriately medically screened  and is safe for discharge home. Pertinent diagnoses were discussed with the patient. Patient was given return precautions.   Final Clinical Impressions(s) / ED Diagnoses   Final diagnoses:  Other migraine without status migrainosus, not intractable  Dizziness  Syncope, unspecified syncope type    New Prescriptions New Prescriptions   MECLIZINE (ANTIVERT) 25 MG TABLET    Take 1 tablet (25 mg total) by mouth 3 (three) times daily as needed for dizziness.  I personally performed the services described in this documentation, which was scribed in my presence. The recorded information has been reviewed and is accurate.    Merryl Hacker, MD 01/24/16 [phone]

## 2016-02-01 ENCOUNTER — Encounter (HOSPITAL_COMMUNITY): Payer: Self-pay | Admitting: Emergency Medicine

## 2016-02-01 ENCOUNTER — Emergency Department (HOSPITAL_COMMUNITY)
Admission: EM | Admit: 2016-02-01 | Discharge: 2016-02-01 | Disposition: A | Discharge status: Home / Self Care | Payer: BLUE CROSS/BLUE SHIELD | Attending: Emergency Medicine | Admitting: Emergency Medicine

## 2016-02-01 DIAGNOSIS — K0889 Other specified disorders of teeth and supporting structures: Secondary | ICD-10-CM | POA: Diagnosis not present

## 2016-02-01 DIAGNOSIS — K029 Dental caries, unspecified: Secondary | ICD-10-CM

## 2016-02-01 DIAGNOSIS — F17210 Nicotine dependence, cigarettes, uncomplicated: Secondary | ICD-10-CM | POA: Diagnosis not present

## 2016-02-01 MED ORDER — NAPROXEN 500 MG PO TABS: 500.0000 mg | ORAL_TABLET | Freq: Two times a day (BID) | ORAL | 0 refills | Status: DC

## 2016-02-01 MED ORDER — CLINDAMYCIN HCL 150 MG PO CAPS
300.0000 mg | ORAL_CAPSULE | Freq: Once | ORAL | Status: AC
  Administered 2016-02-01: 300 mg via ORAL
  Filled 2016-02-01: qty 2

## 2016-02-01 MED ORDER — OXYCODONE-ACETAMINOPHEN 5-325 MG PO TABS
1.0000 | ORAL_TABLET | Freq: Once | ORAL | Status: AC
  Administered 2016-02-01: 1 via ORAL
  Filled 2016-02-01: qty 1

## 2016-02-01 MED ORDER — CLINDAMYCIN HCL 150 MG PO CAPS: 300.0000 mg | ORAL_CAPSULE | Freq: Three times a day (TID) | ORAL | 0 refills | Status: DC

## 2016-02-01 NOTE — ED Provider Notes (Signed)
[facility] DEPT Provider Note   [name]: [id number] Arrival date & time: 02/01/16  0229     History   Chief Complaint Chief Complaint  Patient presents with  . Dental Pain    HPI Kathleen Walsh is a 35 y.o. female.  Patient presents with complaint of left upper and lower dental pain for the past 3-4 days. No facial swelling or fever. No difficulty swallowing. She has had similar issues in the past.   The history is provided by the patient. No language interpreter was used.  Dental Pain   This is a recurrent problem.    Past Medical History:  Diagnosis Date  . Abdominal wall pain   . Anemia   . Anxiety   . Arthritis    left ankle  . Blood dyscrasia    lupus anticoagulant during pregnancy  . Depression    takes cymbalta   . Headache(784.0)    migraines  . Sleep apnea    USES C-PAP    Patient Active Problem List   Diagnosis Date Noted  . Cardiomegaly 08/15/2013  . SOB (shortness of breath) 08/15/2013  . Pleuritic chest pain 08/15/2013  . Acute bronchitis 08/15/2013  . Postpartum care following cesarean delivery and tubal sterilization (10/13) 02/11/2013  . Abdominal muscle strain 12/20/2011  . Constipation 07/06/2011  . Abdominal pain 07/06/2011  . DIABETES MELLITUS, GESTATIONAL 04/17/2009  . DYSPNEA 04/17/2009    Past Surgical History:  Procedure Laterality Date  . ABDOMINAL HYSTERECTOMY    . CESAREAN SECTION WITH BILATERAL TUBAL LIGATION Bilateral 02/11/2013   Procedure: Repeat CESAREAN SECTION WITH BILATERAL TUBAL LIGATION;  Surgeon: Lovenia Kim, MD;  Location: [facility];  Service: Obstetrics;  Laterality: Bilateral;  EDD: 03/01/13  . DEBRIDEMENT OF ABDOMINAL WALL ABSCESS N/A 12/03/2014   Procedure: INCISION OF ABDOMINAL WALL LIPOMA;  Surgeon: Greer Pickerel, MD;  Location: [facility];  Service: General;  Laterality: N/A;  . DILATION AND CURETTAGE OF UTERUS  2004  . DILITATION & CURRETTAGE/HYSTROSCOPY WITH NOVASURE ABLATION N/A 11/19/2014   Procedure:  DILATATION & CURETTAGE/HYSTEROSCOPY WITH NOVASURE ABLATION;  Surgeon: Brien Few, MD;  Location: [facility];  Service: Gynecology;  Laterality: N/A;  . ENDOMETRIAL ABLATION  10/20/14  . FOOT SURGERY  2008  . HERNIA REPAIR  09/19/2011  . LAPAROSCOPY N/A 12/03/2014   Procedure: LAPAROSCOPY DIAGNOSTIC WITH LYSIS OF ADHESIONS;  Surgeon: Greer Pickerel, MD;  Location: [facility];  Service: General;  Laterality: N/A;  . TUBAL LIGATION    . VENTRAL HERNIA REPAIR  09/20/10   Laparoscopic, Dr Greer Pickerel  . WISDOM TOOTH EXTRACTION      OB History    Gravida Para Term Preterm AB Living   4 2 2   2 2    SAB TAB Ectopic Multiple Live Births           1       Home Medications    Prior to Admission medications   Medication Sig Start Date End Date Taking? Authorizing Provider  dicyclomine (BENTYL) 20 MG tablet Take 1 tablet (20 mg total) by mouth 2 (two) times daily. Patient not taking: Reported on 01/24/2016 09/28/15   Helane Gunther Joy, PA-C  DULoxetine (CYMBALTA) 60 MG capsule Take 60 mg by mouth daily.    Historical Provider, MD  furosemide (LASIX) 20 MG tablet Take 20 mg by mouth daily.    Historical Provider, MD  meclizine (ANTIVERT) 25 MG tablet Take 1 tablet (25 mg total) by mouth 3 (three) times daily as needed  for dizziness. 01/24/16   Merryl Hacker, MD  ondansetron (ZOFRAN ODT) 4 MG disintegrating tablet Take 1 tablet (4 mg total) by mouth every 8 (eight) hours as needed for nausea or vomiting. Patient not taking: Reported on 01/24/2016 09/28/15   Lorayne Bender, PA-C  oxyCODONE-acetaminophen (PERCOCET/ROXICET) 5-325 MG tablet Take 1-2 tablets by mouth every 4 (four) hours as needed for severe pain. Patient not taking: Reported on 01/24/2016 09/28/15   Lorayne Bender, PA-C    Family History Family History  Problem Relation Age of Onset  . Diabetes Mother   . Hypertension Mother   . Diabetes Father   . Hypertension Father     Social History Social History  Substance Use Topics  . Smoking status:  Current Every Day Smoker    Packs/day: 0.30    Types: Cigarettes  . Smokeless tobacco: Never Used  . Alcohol use No     Comment: "once every three years"      Allergies   Amoxicillin   Review of Systems Review of Systems  Constitutional: Negative for chills and fever.  HENT: Positive for dental problem. Negative for facial swelling and trouble swallowing.   Gastrointestinal: Negative.  Negative for nausea.  Neurological: Negative.  Negative for headaches.     Physical Exam Updated Vital Signs BP 157/78 (BP Location: Left Arm)   Pulse 90   Temp 99 F (37.2 C) (Oral)   Resp 18   Ht 5\' 6"  (1.676 m)   Wt (!) 137.4 kg   LMP 08/31/2015   SpO2 98%   BMI 48.91 kg/m   Physical Exam  Constitutional: She is oriented to person, place, and time. She appears well-developed and well-nourished.  HENT:  Mouth/Throat: Oropharynx is clear and moist.  Widespread dental decay and gingival disease. No facial swelling.   Neck: Normal range of motion.  Pulmonary/Chest: Effort normal.  Neurological: She is alert and oriented to person, place, and time.  Skin: Skin is warm and dry.     ED Treatments / Results  Labs (all labs ordered are listed, but only abnormal results are displayed) Labs Reviewed - No data to display  EKG  EKG Interpretation None       Radiology No results found.  Procedures Procedures (including critical care time)  Medications Ordered in ED Medications  clindamycin (CLEOCIN) capsule 300 mg (not administered)  oxyCODONE-acetaminophen (PERCOCET/ROXICET) 5-325 MG per tablet 1 tablet (not administered)     Initial Impression / Assessment and Plan / ED Course  I have reviewed the triage vital signs and the nursing notes.  Pertinent labs & imaging results that were available during my care of the patient were reviewed by me and considered in my medical decision making (see chart for details).  Clinical Course    Patient with advanced dental decay  and new dental pain. Will Rx abx, pain control and provide dental resources for outpatient follow up.  Final Clinical Impressions(s) / ED Diagnoses   Final diagnoses:  None   1. Dental caries  New Prescriptions New Prescriptions   No medications on file     Charlann Lange, PA-C 02/01/16 Jean [place], MD 02/02/16 [phone]

## 2016-02-01 NOTE — ED Notes (Signed)
Patient Alert and oriented X4. Stable and ambulatory. Patient verbalized understanding of the discharge instructions.  Patient belongings were taken by the patient.  

## 2016-02-01 NOTE — ED Triage Notes (Signed)
C/o L upper and lower toothache since Thursday.

## 2016-02-01 NOTE — ED Notes (Signed)
Patient is A&Ox4 at this time.  Patient in no signs of distress.  Please see providers note for complete history and physical exam.  

## 2016-02-19 DIAGNOSIS — G4733 Obstructive sleep apnea (adult) (pediatric): Secondary | ICD-10-CM | POA: Diagnosis not present

## 2016-03-21 DIAGNOSIS — G4733 Obstructive sleep apnea (adult) (pediatric): Secondary | ICD-10-CM | POA: Diagnosis not present

## 2016-03-30 ENCOUNTER — Encounter (HOSPITAL_COMMUNITY): Payer: Self-pay | Admitting: Emergency Medicine

## 2016-03-30 ENCOUNTER — Ambulatory Visit (HOSPITAL_COMMUNITY)
Admission: EM | Admit: 2016-03-30 | Discharge: 2016-03-30 | Disposition: A | Discharge status: Home / Self Care | Payer: BLUE CROSS/BLUE SHIELD | Attending: Family Medicine | Admitting: Family Medicine

## 2016-03-30 DIAGNOSIS — R05 Cough: Secondary | ICD-10-CM

## 2016-03-30 DIAGNOSIS — J40 Bronchitis, not specified as acute or chronic: Secondary | ICD-10-CM

## 2016-03-30 DIAGNOSIS — R059 Cough, unspecified: Secondary | ICD-10-CM

## 2016-03-30 DIAGNOSIS — R062 Wheezing: Secondary | ICD-10-CM | POA: Diagnosis not present

## 2016-03-30 MED ORDER — AZITHROMYCIN 250 MG PO TABS: 250.0000 mg | ORAL_TABLET | Freq: Every day | ORAL | 0 refills | Status: DC

## 2016-03-30 MED ORDER — ALBUTEROL SULFATE HFA 108 (90 BASE) MCG/ACT IN AERS: 2.0000 | INHALATION_SPRAY | RESPIRATORY_TRACT | 0 refills | Status: DC | PRN

## 2016-03-30 MED ORDER — PREDNISONE 10 MG PO TABS: ORAL_TABLET | ORAL | 0 refills | Status: DC

## 2016-03-30 MED ORDER — BENZONATATE 100 MG PO CAPS: 200.0000 mg | ORAL_CAPSULE | Freq: Three times a day (TID) | ORAL | 0 refills | Status: DC | PRN

## 2016-03-30 NOTE — ED Triage Notes (Signed)
The patient presented to the [facility] with a complaint of a cough x 1 month. The patient reported that she has been using OTC meds with minimal results. She also reported the symptoms to be worse at night.

## 2016-03-30 NOTE — ED Provider Notes (Signed)
[name]: [id number]     Arrival date & time 03/30/16  1100 History   First MD Initiated Contact with Patient 03/30/16 1239     Chief Complaint  Patient presents with  . Cough   (Consider location/radiation/quality/duration/timing/severity/associated sxs/prior Treatment) Patient c/o cough and wheezing.  She has hx of long time cigarette smoking.  She c/o yellow green productive cough.  She is wheezing and coughing a lot at night.  She has had sx's for over a month.   The history is provided by the patient.  Cough  Cough characteristics:  Productive Sputum characteristics:  Green and yellow Severity:  Moderate Onset quality:  Gradual Duration:  4 weeks Timing:  Intermittent Progression:  Worsening Chronicity:  New Smoker: yes   Context: smoke exposure and upper respiratory infection   Relieved by:  Nothing Worsened by:  Deep breathing, exposure to cold air, smoking and activity Ineffective treatments:  None tried Associated symptoms: shortness of breath, sinus congestion and sore throat     Past Medical History:  Diagnosis Date  . Abdominal wall pain   . Anemia   . Anxiety   . Arthritis    left ankle  . Blood dyscrasia    lupus anticoagulant during pregnancy  . Depression    takes cymbalta   . Headache(784.0)    migraines  . Sleep apnea    USES C-PAP   Past Surgical History:  Procedure Laterality Date  . ABDOMINAL HYSTERECTOMY    . CESAREAN SECTION WITH BILATERAL TUBAL LIGATION Bilateral 02/11/2013   Procedure: Repeat CESAREAN SECTION WITH BILATERAL TUBAL LIGATION;  Surgeon: Lovenia Kim, MD;  Location: [facility];  Service: Obstetrics;  Laterality: Bilateral;  EDD: 03/01/13  . DEBRIDEMENT OF ABDOMINAL WALL ABSCESS N/A 12/03/2014   Procedure: INCISION OF ABDOMINAL WALL LIPOMA;  Surgeon: Greer Pickerel, MD;  Location: [facility];  Service: General;  Laterality: N/A;  . DILATION AND CURETTAGE OF UTERUS  2004  . DILITATION & CURRETTAGE/HYSTROSCOPY WITH NOVASURE ABLATION N/A  11/19/2014   Procedure: DILATATION & CURETTAGE/HYSTEROSCOPY WITH NOVASURE ABLATION;  Surgeon: Brien Few, MD;  Location: [facility];  Service: Gynecology;  Laterality: N/A;  . ENDOMETRIAL ABLATION  10/20/14  . FOOT SURGERY  2008  . HERNIA REPAIR  09/19/2011  . LAPAROSCOPY N/A 12/03/2014   Procedure: LAPAROSCOPY DIAGNOSTIC WITH LYSIS OF ADHESIONS;  Surgeon: Greer Pickerel, MD;  Location: [facility];  Service: General;  Laterality: N/A;  . TUBAL LIGATION    . VENTRAL HERNIA REPAIR  09/20/10   Laparoscopic, Dr Greer Pickerel  . WISDOM TOOTH EXTRACTION     Family History  Problem Relation Age of Onset  . Diabetes Mother   . Hypertension Mother   . Diabetes Father   . Hypertension Father    Social History  Substance Use Topics  . Smoking status: Current Every Day Smoker    Packs/day: 0.30    Types: Cigarettes  . Smokeless tobacco: Never Used  . Alcohol use No     Comment: "once every three years"    OB History    Gravida Para Term Preterm AB Living   4 2 2   2 2    SAB TAB Ectopic Multiple Live Births           1     Review of Systems  Constitutional: Negative.   HENT: Positive for sore throat.   Eyes: Negative.   Respiratory: Positive for cough and shortness of breath.   Cardiovascular: Negative.   Gastrointestinal: Negative.   Endocrine:  Negative.   Genitourinary: Negative.   Musculoskeletal: Negative.   Skin: Negative.   Allergic/Immunologic: Negative.   Neurological: Negative.   Hematological: Negative.   Psychiatric/Behavioral: Negative.     Allergies  Amoxicillin  Home Medications   Prior to Admission medications   Medication Sig Start Date End Date Taking? Authorizing Provider  DULoxetine (CYMBALTA) 60 MG capsule Take 60 mg by mouth daily.   Yes Historical Provider, MD  furosemide (LASIX) 20 MG tablet Take 20 mg by mouth daily.   Yes Historical Provider, MD  albuterol (PROVENTIL HFA;VENTOLIN HFA) 108 (90 Base) MCG/ACT inhaler Inhale 2 puffs into the lungs every 4 (four)  hours as needed for wheezing or shortness of breath. 03/30/16   Lysbeth Penner, FNP  azithromycin (ZITHROMAX) 250 MG tablet Take 1 tablet (250 mg total) by mouth daily. Take first 2 tablets together, then 1 every day until finished. 03/30/16   Lysbeth Penner, FNP  benzonatate (TESSALON) 100 MG capsule Take 2 capsules (200 mg total) by mouth 3 (three) times daily as needed for cough. 03/30/16   Lysbeth Penner, FNP  clindamycin (CLEOCIN) 150 MG capsule Take 2 capsules (300 mg total) by mouth 3 (three) times daily. 02/01/16   Charlann Lange, PA-C  dicyclomine (BENTYL) 20 MG tablet Take 1 tablet (20 mg total) by mouth 2 (two) times daily. Patient not taking: Reported on 01/24/2016 09/28/15   Helane Gunther Joy, PA-C  meclizine (ANTIVERT) 25 MG tablet Take 1 tablet (25 mg total) by mouth 3 (three) times daily as needed for dizziness. 01/24/16   Merryl Hacker, MD  naproxen (NAPROSYN) 500 MG tablet Take 1 tablet (500 mg total) by mouth 2 (two) times daily. 02/01/16   Shari Upstill, PA-C  ondansetron (ZOFRAN ODT) 4 MG disintegrating tablet Take 1 tablet (4 mg total) by mouth every 8 (eight) hours as needed for nausea or vomiting. Patient not taking: Reported on 01/24/2016 09/28/15   Lorayne Bender, PA-C  oxyCODONE-acetaminophen (PERCOCET/ROXICET) 5-325 MG tablet Take 1-2 tablets by mouth every 4 (four) hours as needed for severe pain. Patient not taking: Reported on 01/24/2016 09/28/15   Helane Gunther Joy, PA-C  predniSONE (DELTASONE) 10 MG tablet Take 4 po qd x 2 days then 2 po qd x 4 days then stop 03/30/16   Lysbeth Penner, FNP   Meds Ordered and Administered this Visit  Medications - No data to display  BP 139/70 (BP Location: Right Arm)   Pulse 92   Temp 98.6 F (37 C) (Oral)   Resp 20   LMP 08/31/2015   SpO2 98%  No data found.   Physical Exam  Constitutional: She is oriented to person, place, and time. She appears well-developed and well-nourished.  HENT:  Head: Normocephalic and atraumatic.  Right  Ear: External ear normal.  Left Ear: External ear normal.  Mouth/Throat: Oropharynx is clear and moist.  Eyes: Conjunctivae and EOM are normal. Pupils are equal, round, and reactive to light.  Neck: Normal range of motion. Neck supple.  Cardiovascular: Normal rate, regular rhythm and normal heart sounds.   Pulmonary/Chest: Effort normal. She has wheezes.  Abdominal: Soft. Bowel sounds are normal.  Neurological: She is alert and oriented to person, place, and time.  Nursing note and vitals reviewed.   Urgent Care Course   Clinical Course     Procedures (including critical care time)  Labs Review Labs Reviewed - No data to display  Imaging Review No results found.   Visual Acuity Review  Right Eye Distance:   Left Eye Distance:   Bilateral Distance:    Right Eye Near:   Left Eye Near:    Bilateral Near:         MDM   1. Bronchitis   2. Wheezing   3. Cough    Prednisone 10 mg 4 po qd x 2 days then 2 po qd x 4 days then stop #16 Zithromax 250mg  2 po first day and then one po qd x 4 days #6 Albuterol MDI 2 puffs q 4 hours prn #1 Tessalon Perles 200mg  po tid prn #21  Push po fluids, rest, tylenol and motrin otc prn as directed for fever, arthralgias, and myalgias.  Follow up prn if sx's continue or persist.    Lysbeth Penner, FNP 03/30/16 1252

## 2016-04-03 ENCOUNTER — Encounter (HOSPITAL_COMMUNITY): Payer: Self-pay

## 2016-04-03 ENCOUNTER — Emergency Department (HOSPITAL_COMMUNITY)
Admission: EM | Admit: 2016-04-03 | Discharge: 2016-04-03 | Disposition: A | Discharge status: Home / Self Care | Payer: BLUE CROSS/BLUE SHIELD | Attending: Emergency Medicine | Admitting: Emergency Medicine

## 2016-04-03 ENCOUNTER — Emergency Department (HOSPITAL_COMMUNITY): Payer: BLUE CROSS/BLUE SHIELD

## 2016-04-03 DIAGNOSIS — R079 Chest pain, unspecified: Secondary | ICD-10-CM | POA: Diagnosis not present

## 2016-04-03 DIAGNOSIS — J40 Bronchitis, not specified as acute or chronic: Secondary | ICD-10-CM | POA: Insufficient documentation

## 2016-04-03 DIAGNOSIS — R05 Cough: Secondary | ICD-10-CM | POA: Diagnosis not present

## 2016-04-03 DIAGNOSIS — F17210 Nicotine dependence, cigarettes, uncomplicated: Secondary | ICD-10-CM | POA: Diagnosis not present

## 2016-04-03 DIAGNOSIS — Z720 Tobacco use: Secondary | ICD-10-CM

## 2016-04-03 DIAGNOSIS — R0602 Shortness of breath: Secondary | ICD-10-CM | POA: Diagnosis present

## 2016-04-03 LAB — CBC WITH DIFFERENTIAL/PLATELET
Basophils Absolute: 0.1 10*3/uL (ref 0.0–0.1)
Basophils Relative: 0 %
EOS ABS: 0.3 10*3/uL (ref 0.0–0.7)
Eosinophils Relative: 2 %
HEMATOCRIT: 43.6 % (ref 36.0–46.0)
HEMOGLOBIN: 13.7 g/dL (ref 12.0–15.0)
LYMPHS ABS: 3.7 10*3/uL (ref 0.7–4.0)
Lymphocytes Relative: 22 %
MCH: 25 pg — AB (ref 26.0–34.0)
MCHC: 31.4 g/dL (ref 30.0–36.0)
MCV: 79.4 fL (ref 78.0–100.0)
MONOS PCT: 8 %
Monocytes Absolute: 1.3 10*3/uL — ABNORMAL HIGH (ref 0.1–1.0)
NEUTROS ABS: 11.6 10*3/uL — AB (ref 1.7–7.7)
NEUTROS PCT: 68 %
Platelets: 216 10*3/uL (ref 150–400)
RBC: 5.49 MIL/uL — AB (ref 3.87–5.11)
RDW: 15.6 % — ABNORMAL HIGH (ref 11.5–15.5)
WBC: 16.9 10*3/uL — AB (ref 4.0–10.5)

## 2016-04-03 LAB — BASIC METABOLIC PANEL
Anion gap: 10 (ref 5–15)
BUN: 18 mg/dL (ref 6–20)
CHLORIDE: 105 mmol/L (ref 101–111)
CO2: 24 mmol/L (ref 22–32)
CREATININE: 0.65 mg/dL (ref 0.44–1.00)
Calcium: 9.3 mg/dL (ref 8.9–10.3)
GFR calc non Af Amer: >60 mL/min (ref >60)
Glucose, Bld: 121 mg/dL — ABNORMAL HIGH (ref 65–99)
POTASSIUM: 3.3 mmol/L — AB (ref 3.5–5.1)
SODIUM: 139 mmol/L (ref 135–145)

## 2016-04-03 LAB — I-STAT TROPONIN, ED
TROPONIN I, POC: 0 ng/mL (ref 0.00–0.08)
Troponin i, poc: 0 ng/mL (ref 0.00–0.08)

## 2016-04-03 LAB — D-DIMER, QUANTITATIVE: D-Dimer, Quant: <0.27 ug/mL-FEU (ref 0.00–0.50)

## 2016-04-03 LAB — BRAIN NATRIURETIC PEPTIDE: B Natriuretic Peptide: 84.6 pg/mL (ref 0.0–100.0)

## 2016-04-03 MED ORDER — IPRATROPIUM-ALBUTEROL 0.5-2.5 (3) MG/3ML IN SOLN
3.0000 mL | Freq: Once | RESPIRATORY_TRACT | Status: AC
  Administered 2016-04-03: 3 mL via RESPIRATORY_TRACT
  Filled 2016-04-03: qty 3

## 2016-04-03 NOTE — Discharge Instructions (Signed)
Please continue to try to quit smoking. Do not hesitate to return to the emergency department for any new, worsening or concerning symptoms.  Please follow with your primary care doctor. They may want to send you to a special lung doctor if persistent symptoms persist.

## 2016-04-03 NOTE — ED Notes (Signed)
Patient transported to X-ray 

## 2016-04-03 NOTE — ED Triage Notes (Signed)
Patient complains of ongoing cough and congestion for the past several days. Is taking antibiotic for bronchitis but states that she is not feeling better. Hyperventilating on arrival. Chest pain with cough. smoker

## 2016-04-03 NOTE — ED Provider Notes (Signed)
[facility] DEPT Provider Note   [name]: [id number] Arrival date & time: 04/03/16  1808     History   Chief Complaint Chief Complaint  Patient presents with  . Shortness of Breath  . Cough     HPI  Blood pressure 124/63, pulse 80, temperature 98 F (36.7 C), temperature source Oral, resp. rate 20, height 5\' 6"  (1.676 m), weight (!) 138.8 kg, last menstrual period 08/31/2015, SpO2 98 %.  Kathleen Walsh is a 35 y.o. female with past medical history significant for tobacco use, lupus anticoagulant complaining of cough and shortness of breath worsening over the week. Patient was seen at urgent care and treated for bronchitis with azithromycin, prednisone and inhalers and she feels as if her symptoms are becoming worse. She endorses tactile fever at home, right-sided chest pain intermittently and exacerbated with cough. She denies birth control, history of DVT/PE. She smokes cigarettes but states that she's cut down in the last month and only smoked 1 pack a the last 4 weeks. She doesn't have a history of COPD.Patient denies ID diabetes, hypertension, hyperlipidemia, family history of heart attacks.   Past Medical History:  Diagnosis Date  . Abdominal wall pain   . Anemia   . Anxiety   . Arthritis    left ankle  . Blood dyscrasia    lupus anticoagulant during pregnancy  . Depression    takes cymbalta   . Headache(784.0)    migraines  . Sleep apnea    USES C-PAP    Patient Active Problem List   Diagnosis Date Noted  . Cardiomegaly 08/15/2013  . SOB (shortness of breath) 08/15/2013  . Pleuritic chest pain 08/15/2013  . Acute bronchitis 08/15/2013  . Postpartum care following cesarean delivery and tubal sterilization (10/13) 02/11/2013  . Abdominal muscle strain 12/20/2011  . Constipation 07/06/2011  . Abdominal pain 07/06/2011  . DIABETES MELLITUS, GESTATIONAL 04/17/2009  . DYSPNEA 04/17/2009    Past Surgical History:  Procedure Laterality Date  . ABDOMINAL  HYSTERECTOMY    . CESAREAN SECTION WITH BILATERAL TUBAL LIGATION Bilateral 02/11/2013   Procedure: Repeat CESAREAN SECTION WITH BILATERAL TUBAL LIGATION;  Surgeon: Lovenia Kim, MD;  Location: [facility];  Service: Obstetrics;  Laterality: Bilateral;  EDD: 03/01/13  . DEBRIDEMENT OF ABDOMINAL WALL ABSCESS N/A 12/03/2014   Procedure: INCISION OF ABDOMINAL WALL LIPOMA;  Surgeon: Greer Pickerel, MD;  Location: [facility];  Service: General;  Laterality: N/A;  . DILATION AND CURETTAGE OF UTERUS  2004  . DILITATION & CURRETTAGE/HYSTROSCOPY WITH NOVASURE ABLATION N/A 11/19/2014   Procedure: DILATATION & CURETTAGE/HYSTEROSCOPY WITH NOVASURE ABLATION;  Surgeon: Brien Few, MD;  Location: [facility];  Service: Gynecology;  Laterality: N/A;  . ENDOMETRIAL ABLATION  10/20/14  . FOOT SURGERY  2008  . HERNIA REPAIR  09/19/2011  . LAPAROSCOPY N/A 12/03/2014   Procedure: LAPAROSCOPY DIAGNOSTIC WITH LYSIS OF ADHESIONS;  Surgeon: Greer Pickerel, MD;  Location: [facility];  Service: General;  Laterality: N/A;  . TUBAL LIGATION    . VENTRAL HERNIA REPAIR  09/20/10   Laparoscopic, Dr Greer Pickerel  . WISDOM TOOTH EXTRACTION      OB History    Gravida Para Term Preterm AB Living   4 2 2   2 2    SAB TAB Ectopic Multiple Live Births           1       Home Medications    Prior to Admission medications   Medication Sig Start Date End Date Taking? Authorizing  Provider  albuterol (PROVENTIL HFA;VENTOLIN HFA) 108 (90 Base) MCG/ACT inhaler Inhale 2 puffs into the lungs every 4 (four) hours as needed for wheezing or shortness of breath. 03/30/16  Yes Lysbeth Penner, FNP  benzonatate (TESSALON) 100 MG capsule Take 2 capsules (200 mg total) by mouth 3 (three) times daily as needed for cough. 03/30/16  Yes Lysbeth Penner, FNP  DULoxetine (CYMBALTA) 60 MG capsule Take 60 mg by mouth daily.   Yes Historical Provider, MD  furosemide (LASIX) 20 MG tablet Take 20 mg by mouth daily.   Yes Historical Provider, MD  azithromycin (ZITHROMAX)  250 MG tablet Take 1 tablet (250 mg total) by mouth daily. Take first 2 tablets together, then 1 every day until finished. Patient not taking: Reported on 04/03/2016 03/30/16   Lysbeth Penner, FNP  predniSONE (DELTASONE) 10 MG tablet Take 4 po qd x 2 days then 2 po qd x 4 days then stop Patient not taking: Reported on 04/03/2016 03/30/16   Lysbeth Penner, FNP    Family History Family History  Problem Relation Age of Onset  . Diabetes Mother   . Hypertension Mother   . Diabetes Father   . Hypertension Father     Social History Social History  Substance Use Topics  . Smoking status: Current Every Day Smoker    Packs/day: 0.30    Types: Cigarettes  . Smokeless tobacco: Never Used  . Alcohol use No     Comment: "once every three years"      Allergies   Amoxicillin   Review of Systems Review of Systems  10 systems reviewed and found to be negative, except as noted in the HPI.   Physical Exam Updated Vital Signs BP 126/60 (BP Location: Left Arm)   Pulse 81   Temp 98.3 F (36.8 C) (Oral)   Resp 24   Ht 5\' 6"  (1.676 m)   Wt (!) 138.8 kg   LMP 08/31/2015   SpO2 98%   BMI 49.39 kg/m   Physical Exam  Constitutional: She is oriented to person, place, and time. She appears well-developed and well-nourished. No distress.  Obese  HENT:  Head: Normocephalic and atraumatic.  Mouth/Throat: Oropharynx is clear and moist.  Eyes: Conjunctivae and EOM are normal. Pupils are equal, round, and reactive to light.  Neck: Normal range of motion. No JVD present. No tracheal deviation present.  Cardiovascular: Normal rate, regular rhythm and intact distal pulses.   Pulmonary/Chest: Effort normal and breath sounds normal. No stridor. No respiratory distress. She has no wheezes. She has no rales. She exhibits no tenderness.  Resting comfortably, speaking in complete sentences, no accessory muscle use.  Abdominal: Soft. She exhibits no distension and no mass. There is no tenderness.  There is no rebound and no guarding. No hernia.  Musculoskeletal: Normal range of motion. She exhibits no edema or tenderness.  No calf asymmetry, superficial collaterals, palpable cords, edema  ++ Tender palpation in the left calf   Neurological: She is alert and oriented to person, place, and time.  Skin: Skin is warm. She is not diaphoretic.  Psychiatric: She has a normal mood and affect.  Nursing note and vitals reviewed.    ED Treatments / Results  Labs (all labs ordered are listed, but only abnormal results are displayed) Labs Reviewed  CBC WITH DIFFERENTIAL/PLATELET - Abnormal; Notable for the following:       Result Value   WBC 16.9 (*)    RBC 5.49 (*)  MCH 25.0 (*)    RDW 15.6 (*)    Neutro Abs 11.6 (*)    Monocytes Absolute 1.3 (*)    All other components within normal limits  BASIC METABOLIC PANEL - Abnormal; Notable for the following:    Potassium 3.3 (*)    Glucose, Bld 121 (*)    All other components within normal limits  D-DIMER, QUANTITATIVE (NOT AT [facility])  BRAIN NATRIURETIC PEPTIDE  I-STAT TROPOININ, ED  I-STAT TROPOININ, ED    EKG  EKG Interpretation  Date/Time:  Sunday April 03 2016 18:13:50 EST Ventricular Rate:  102 PR Interval:  152 QRS Duration: 80 QT Interval:  336 QTC Calculation: 437 R Axis:   89 Text Interpretation:  Sinus tachycardia ST & T wave abnormality, consider inferior ischemia Abnormal ECG Since last tracing ST  abnormality is more prominent Confirmed by Eulis Foster  MD, ELLIOTT [id number]) on 04/03/2016 10:14:09 PM       Radiology Dg Chest 2 View  Result Date: 04/03/2016 CLINICAL DATA:  Recent diagnosis of bronchitis, on medication for 5 days. Productive cough. Right-sided chest pain that radiates into the right arm into the right back. EXAM: CHEST  2 VIEW COMPARISON:  08/11/2014. FINDINGS: Trachea is midline. Heart size normal. Lungs are clear. No pleural fluid. IMPRESSION: No acute findings. Electronically Signed   By: Lorin Picket M.D.   On: 04/03/2016 19:57    Procedures Procedures (including critical care time)  Medications Ordered in ED Medications  ipratropium-albuterol (DUONEB) 0.5-2.5 (3) MG/3ML nebulizer solution 3 mL (3 mLs Nebulization Given 04/03/16 2029)     Initial Impression / Assessment and Plan / ED Course  I have reviewed the triage vital signs and the nursing notes.  Pertinent labs & imaging results that were available during my care of the patient were reviewed by me and considered in my medical decision making (see chart for details).  Clinical Course     Vitals:   04/03/16 1815 04/03/16 1906 04/03/16 2243 04/03/16 2310  BP: 126/84 124/63 132/59 126/60  Pulse: 100 80 82 81  Resp: 24 20 24 24   Temp: 99.8 F (37.7 C) 98 F (36.7 C)  98.3 F (36.8 C)  TempSrc:  Oral  Oral  SpO2: 100% 98% 96% 98%  Weight:  (!) 138.8 kg    Height:  5\' 6"  (1.676 m)      Medications  ipratropium-albuterol (DUONEB) 0.5-2.5 (3) MG/3ML nebulizer solution 3 mL (3 mLs Nebulization Given 04/03/16 2029)    Kathleen Walsh is 35 y.o. female presenting with Cough, shortness of breath, right-sided pleuritic chest pain worsening over the course of one week. Lung sounds are clear, patient saturating well on room air, mild tachycardia on EKG. She has been treated aggressively for bronchitis with prednisone, albuterol, cough medication in addition to antibiotics and she feels that her symptoms are worsening. Given her smoking, obesity, lupus anticoagulant will d-dimer and also evaluate troponin and proBNP. Patient is given a DuoNeb for comfort.  Chest x-rays without infiltrate, blood work reassuring including negative troponins 2, BNP and negative d-dimer. Think this is likely  a systemic bronchitis, have discussed this with the patient and asked her to follow with primary care, if symptoms persist she may need a pulmonology evaluation for COPD.  EKG with nonspecific ST and T-wave changes slightly more pronounced  than prior. Discussed with attending and given that she is low risk by heart score and the pain is quite clearly not cardiac in nature she can follow-up as  an outpatient. I discussed this with her and we've had an extensive discussion of smoking cessation and following with primary care and or cardiology.  Evaluation does not show pathology that would require ongoing emergent intervention or inpatient treatment. Pt is hemodynamically stable and mentating appropriately. Discussed findings and plan with patient/guardian, who agrees with care plan. All questions answered. Return precautions discussed and outpatient follow up given.    Final Clinical Impressions(s) / ED Diagnoses   Final diagnoses:  Bronchitis  Tobacco use    New Prescriptions Discharge Medication List as of 04/03/2016 10:45 PM       Monico Blitz, PA-C 04/04/16 [place], MD 04/06/16 1013

## 2016-04-12 ENCOUNTER — Ambulatory Visit (INDEPENDENT_AMBULATORY_CARE_PROVIDER_SITE_OTHER): Payer: BLUE CROSS/BLUE SHIELD | Admitting: Cardiovascular Disease

## 2016-04-12 ENCOUNTER — Encounter: Payer: Self-pay | Admitting: Cardiovascular Disease

## 2016-04-12 DIAGNOSIS — R9431 Abnormal electrocardiogram [ECG] [EKG]: Secondary | ICD-10-CM | POA: Diagnosis not present

## 2016-04-12 NOTE — Patient Instructions (Signed)
Medication Instructions: Your physician recommends that you continue on your current medications as directed. Please refer to the Current Medication list given to you today.  Follow-Up: Your physician recommends that you follow-up: as needed with Dr. Gwenlyn Found.  If you need a refill on your cardiac medications before your next appointment, please call your pharmacy.

## 2016-04-12 NOTE — Assessment & Plan Note (Signed)
Kathleen Walsh was referred by the emergency room where she was just evaluated for bronchitis and EKG showed nonspecific ST and T-wave changes with a heart rate of 102. When compared to EKG performed September 27 history changes were not present. She really has no risk factors other than mild tobacco abuse. She denies ischemic chest pain. There is a questionable lupus anticoagulant. She negative lower peripheral Doppler studies during her recent ER evaluation, negative enzymes and negative d-dimer. She's had no recurrent chest pain. I do not think her EKG was ischemically mediated but most likely rate related. No further workup is necessary. I will see her back when necessary

## 2016-04-12 NOTE — Progress Notes (Signed)
04/12/2016 [name]   28-Feb-1981  [id number]  Primary Physician [facility] Primary Cardiologist: Lorretta Harp MD Lupe Carney, [place]  HPI:  This L[place] is a 35 year old moderately overweight married Caucasian female mother of 2 children lives in [place]. She was referred by the emergency room for abnormal EKG. 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 Her primary care provider is Cyndi Bender PA-C. Risk factors include a quarter of a pack of tobacco day since age 35. She has no other risk factors she does have sleep apnea on CPAP. She was recently seen in the ER on 04/03/16 with shortness of breath was diagnosed with bronchitis. EKG showed sinus tachycardia with nonspecific ST and T-wave changes.   Current Outpatient Prescriptions  Medication Sig Dispense Refill  . albuterol (PROVENTIL HFA;VENTOLIN HFA) 108 (90 Base) MCG/ACT inhaler Inhale 2 puffs into the lungs every 4 (four) hours as needed for wheezing or shortness of breath. 1 Inhaler 0  . DULoxetine (CYMBALTA) 60 MG capsule Take 60 mg by mouth daily.    . furosemide (LASIX) 20 MG tablet Take 20 mg by mouth daily.     No current facility-administered medications for this visit.     Allergies  Allergen Reactions  . Amoxicillin Nausea And Vomiting    Social History   Social History  . Marital status: Married    Spouse name: N/A  . Number of children: N/A  . Years of education: N/A   Occupational History  . Not on file.   Social History Main Topics  . Smoking status: Current Every Day Smoker    Packs/day: 0.30    Types: Cigarettes  . Smokeless tobacco: Never Used  . Alcohol use No     Comment: "once every three years"   . Drug use: No  . Sexual activity: Yes    Birth control/ protection: Surgical   Other Topics Concern  . Not on file   Social History Narrative  . No narrative on file     Review of Systems: General: negative for chills,  fever, night sweats or weight changes.  Cardiovascular: negative for chest pain, dyspnea on exertion, edema, orthopnea, palpitations, paroxysmal nocturnal dyspnea or shortness of breath Dermatological: negative for rash Respiratory: negative for cough or wheezing Urologic: negative for hematuria Abdominal: negative for nausea, vomiting, diarrhea, bright red blood per rectum, melena, or hematemesis Neurologic: negative for visual changes, syncope, or dizziness All other systems reviewed and are otherwise negative except as noted above.    Blood pressure 136/74, pulse 88, height 5\' 6"  (1.676 m), weight 298 lb (135.2 kg), last menstrual period 08/31/2015.  General appearance: alert and no distress Neck: no adenopathy, no carotid bruit, no JVD, supple, symmetrical, trachea midline and thyroid not enlarged, symmetric, no tenderness/mass/nodules Lungs: clear to auscultation bilaterally Heart: regular rate and rhythm, S1, S2 normal, no murmur, click, rub or gallop Extremities: extremities normal, atraumatic, no cyanosis or edema  EKG not performed today  ASSESSMENT AND PLAN:   Abnormal EKG Mrs. Ambrosi was referred by the emergency room where she was just evaluated for bronchitis and EKG showed nonspecific ST and T-wave changes with a heart rate of 102. When compared to EKG performed September 27 history changes were not present. She really has no risk factors other than mild tobacco abuse. She denies ischemic chest pain. There is a questionable lupus anticoagulant. She negative lower peripheral Doppler studies during her recent ER evaluation, negative  enzymes and negative d-dimer. She's had no recurrent chest pain. I do not think her EKG was ischemically mediated but most likely rate related. No further workup is necessary. I will see her back when necessary      Lorretta Harp MD [facility], [facility] 04/12/2016 3:45 PM

## 2016-04-20 DIAGNOSIS — G4733 Obstructive sleep apnea (adult) (pediatric): Secondary | ICD-10-CM | POA: Diagnosis not present

## 2016-06-06 DIAGNOSIS — R7303 Prediabetes: Secondary | ICD-10-CM | POA: Diagnosis not present

## 2016-06-06 DIAGNOSIS — M7062 Trochanteric bursitis, left hip: Secondary | ICD-10-CM | POA: Diagnosis not present

## 2016-06-06 DIAGNOSIS — R600 Localized edema: Secondary | ICD-10-CM | POA: Diagnosis not present

## 2016-06-06 DIAGNOSIS — F418 Other specified anxiety disorders: Secondary | ICD-10-CM | POA: Diagnosis not present

## 2016-06-23 ENCOUNTER — Emergency Department (HOSPITAL_COMMUNITY)
Admission: EM | Admit: 2016-06-23 | Discharge: 2016-06-23 | Disposition: A | Discharge status: Home / Self Care | Payer: BLUE CROSS/BLUE SHIELD | Attending: Emergency Medicine | Admitting: Emergency Medicine

## 2016-06-23 ENCOUNTER — Encounter (HOSPITAL_COMMUNITY): Payer: Self-pay

## 2016-06-23 DIAGNOSIS — F17210 Nicotine dependence, cigarettes, uncomplicated: Secondary | ICD-10-CM | POA: Insufficient documentation

## 2016-06-23 DIAGNOSIS — M542 Cervicalgia: Secondary | ICD-10-CM | POA: Insufficient documentation

## 2016-06-23 MED ORDER — IBUPROFEN 400 MG PO TABS: 400.0000 mg | ORAL_TABLET | Freq: Three times a day (TID) | ORAL | 0 refills | Status: DC | PRN

## 2016-06-23 MED ORDER — PREDNISONE 20 MG PO TABS
60.0000 mg | ORAL_TABLET | Freq: Once | ORAL | Status: AC
  Administered 2016-06-23: 60 mg via ORAL
  Filled 2016-06-23: qty 3

## 2016-06-23 MED ORDER — PREDNISONE 10 MG PO TABS: 60.0000 mg | ORAL_TABLET | Freq: Every day | ORAL | 0 refills | Status: DC

## 2016-06-23 MED ORDER — METHOCARBAMOL 500 MG PO TABS: 500.0000 mg | ORAL_TABLET | Freq: Three times a day (TID) | ORAL | 0 refills | Status: DC | PRN

## 2016-06-23 NOTE — ED Provider Notes (Signed)
[facility] DEPT Provider Note   [name]: [id number] Arrival date & time: 06/23/16  1435     History   Chief Complaint No chief complaint on file.   HPI Kathleen Walsh is a 36 y.o. female.  HPI Patient reports 4 days of neck pain and neck stiffness without weakness of her arms or legs.  Reports no recent injury of her neck.  States she's had neck pain like this before.  No change in her vision.  No headaches.  Tried ibuprofen without improvement.  Pain is moderate in severity and worse with certain positions.  No fevers or chills.     Past Medical History:  Diagnosis Date  . Abdominal wall pain   . Anemia   . Anxiety   . Arthritis    left ankle  . Blood dyscrasia    lupus anticoagulant during pregnancy  . Depression    takes cymbalta   . Headache(784.0)    migraines  . Sleep apnea    USES C-PAP    Patient Active Problem List   Diagnosis Date Noted  . Abnormal EKG 04/12/2016  . Cardiomegaly 08/15/2013  . SOB (shortness of breath) 08/15/2013  . Pleuritic chest pain 08/15/2013  . Acute bronchitis 08/15/2013  . Postpartum care following cesarean delivery and tubal sterilization (10/13) 02/11/2013  . Abdominal muscle strain 12/20/2011  . Constipation 07/06/2011  . Abdominal pain 07/06/2011  . DIABETES MELLITUS, GESTATIONAL 04/17/2009  . DYSPNEA 04/17/2009    Past Surgical History:  Procedure Laterality Date  . ABDOMINAL HYSTERECTOMY    . CESAREAN SECTION WITH BILATERAL TUBAL LIGATION Bilateral 02/11/2013   Procedure: Repeat CESAREAN SECTION WITH BILATERAL TUBAL LIGATION;  Surgeon: Lovenia Kim, MD;  Location: [facility];  Service: Obstetrics;  Laterality: Bilateral;  EDD: 03/01/13  . DEBRIDEMENT OF ABDOMINAL WALL ABSCESS N/A 12/03/2014   Procedure: INCISION OF ABDOMINAL WALL LIPOMA;  Surgeon: Greer Pickerel, MD;  Location: [facility];  Service: General;  Laterality: N/A;  . DILATION AND CURETTAGE OF UTERUS  2004  . DILITATION & CURRETTAGE/HYSTROSCOPY WITH NOVASURE  ABLATION N/A 11/19/2014   Procedure: DILATATION & CURETTAGE/HYSTEROSCOPY WITH NOVASURE ABLATION;  Surgeon: Brien Few, MD;  Location: [facility];  Service: Gynecology;  Laterality: N/A;  . ENDOMETRIAL ABLATION  10/20/14  . FOOT SURGERY  2008  . HERNIA REPAIR  09/19/2011  . LAPAROSCOPY N/A 12/03/2014   Procedure: LAPAROSCOPY DIAGNOSTIC WITH LYSIS OF ADHESIONS;  Surgeon: Greer Pickerel, MD;  Location: [facility];  Service: General;  Laterality: N/A;  . TUBAL LIGATION    . VENTRAL HERNIA REPAIR  09/20/10   Laparoscopic, Dr Greer Pickerel  . WISDOM TOOTH EXTRACTION      OB History    Gravida Para Term Preterm AB Living   4 2 2   2 2    SAB TAB Ectopic Multiple Live Births           1       Home Medications    Prior to Admission medications   Medication Sig Start Date End Date Taking? Authorizing Provider  albuterol (PROVENTIL HFA;VENTOLIN HFA) 108 (90 Base) MCG/ACT inhaler Inhale 2 puffs into the lungs every 4 (four) hours as needed for wheezing or shortness of breath. 03/30/16   Lysbeth Penner, FNP  DULoxetine (CYMBALTA) 60 MG capsule Take 60 mg by mouth daily.    Historical Provider, MD  furosemide (LASIX) 20 MG tablet Take 20 mg by mouth daily.    Historical Provider, MD  ibuprofen (ADVIL,MOTRIN) 400 MG tablet Take  1 tablet (400 mg total) by mouth every 8 (eight) hours as needed. 06/23/16   Jola Schmidt, MD  methocarbamol (ROBAXIN) 500 MG tablet Take 1 tablet (500 mg total) by mouth every 8 (eight) hours as needed for muscle spasms. 06/23/16   Jola Schmidt, MD  predniSONE (DELTASONE) 10 MG tablet Take 6 tablets (60 mg total) by mouth daily. 06/23/16   Jola Schmidt, MD    Family History Family History  Problem Relation Age of Onset  . Diabetes Mother   . Hypertension Mother   . Diabetes Father   . Hypertension Father     Social History Social History  Substance Use Topics  . Smoking status: Current Every Day Smoker    Packs/day: 0.30    Types: Cigarettes  . Smokeless tobacco: Never Used    . Alcohol use No     Comment: "once every three years"      Allergies   Amoxicillin   Review of Systems Review of Systems  All other systems reviewed and are negative.    Physical Exam Updated Vital Signs BP 148/79 (BP Location: Left Arm)   Pulse 80   Temp 98.9 F (37.2 C) (Oral)   Resp 18   LMP 08/31/2015   SpO2 97%   Physical Exam  Constitutional: She is oriented to person, place, and time. She appears well-developed and well-nourished. No distress.  HENT:  Head: Normocephalic and atraumatic.  Eyes: EOM are normal.  Neck: Neck supple.  Paracervical tenderness without cervical point tenderness.  No cervical step-off.  Full range of motion neck.  No meningeal signs.  Cardiovascular: Normal rate, regular rhythm and normal heart sounds.   Pulmonary/Chest: Effort normal and breath sounds normal.  Abdominal: Soft. She exhibits no distension. There is no tenderness.  Musculoskeletal: Normal range of motion.  Neurological: She is alert and oriented to person, place, and time.  Skin: Skin is warm and dry.  Psychiatric: She has a normal mood and affect. Judgment normal.  Nursing note and vitals reviewed.    ED Treatments / Results  Labs (all labs ordered are listed, but only abnormal results are displayed) Labs Reviewed - No data to display  EKG  EKG Interpretation None       Radiology No results found.  Procedures Procedures (including critical care time)  Medications Ordered in ED Medications  predniSONE (DELTASONE) tablet 60 mg (60 mg Oral Given 06/23/16 1600)     Initial Impression / Assessment and Plan / ED Course  I have reviewed the triage vital signs and the nursing notes.  Pertinent labs & imaging results that were available during my care of the patient were reviewed by me and considered in my medical decision making (see chart for details).    Patient is overall well-appearing.  This is likely musculoskeletal neck pain.  No indication for  acute imaging.     Final Clinical Impressions(s) / ED Diagnoses   Final diagnoses:  Neck pain    New Prescriptions New Prescriptions   IBUPROFEN (ADVIL,MOTRIN) 400 MG TABLET    Take 1 tablet (400 mg total) by mouth every 8 (eight) hours as needed.   METHOCARBAMOL (ROBAXIN) 500 MG TABLET    Take 1 tablet (500 mg total) by mouth every 8 (eight) hours as needed for muscle spasms.   PREDNISONE (DELTASONE) 10 MG TABLET    Take 6 tablets (60 mg total) by mouth daily.     Jola Schmidt, MD 06/24/16 [phone]

## 2016-06-23 NOTE — ED Triage Notes (Signed)
Patient complains of 4 days of neck stiffness and tightness, states that her neck burns with any movement of head and neck, denies trauma

## 2016-06-23 NOTE — ED Notes (Signed)
Pt stable, ambulatory, states understanding of discharge instructions 

## 2016-07-13 ENCOUNTER — Ambulatory Visit (INDEPENDENT_AMBULATORY_CARE_PROVIDER_SITE_OTHER): Payer: Self-pay | Admitting: Orthopedic Surgery

## 2016-07-13 ENCOUNTER — Ambulatory Visit (INDEPENDENT_AMBULATORY_CARE_PROVIDER_SITE_OTHER): Payer: Self-pay

## 2016-07-13 ENCOUNTER — Ambulatory Visit (INDEPENDENT_AMBULATORY_CARE_PROVIDER_SITE_OTHER): Payer: BLUE CROSS/BLUE SHIELD | Admitting: Orthopaedic Surgery

## 2016-07-13 ENCOUNTER — Encounter (INDEPENDENT_AMBULATORY_CARE_PROVIDER_SITE_OTHER): Payer: Self-pay | Admitting: Orthopaedic Surgery

## 2016-07-13 VITALS — BP 158/84 | HR 98 | Ht 66.0 in | Wt 301.0 lb

## 2016-07-13 DIAGNOSIS — G8929 Other chronic pain: Secondary | ICD-10-CM

## 2016-07-13 DIAGNOSIS — M25552 Pain in left hip: Secondary | ICD-10-CM | POA: Diagnosis not present

## 2016-07-13 DIAGNOSIS — M25572 Pain in left ankle and joints of left foot: Secondary | ICD-10-CM | POA: Insufficient documentation

## 2016-07-13 DIAGNOSIS — M1612 Unilateral primary osteoarthritis, left hip: Secondary | ICD-10-CM

## 2016-07-13 DIAGNOSIS — M7062 Trochanteric bursitis, left hip: Secondary | ICD-10-CM

## 2016-07-13 DIAGNOSIS — M5442 Lumbago with sciatica, left side: Secondary | ICD-10-CM | POA: Diagnosis not present

## 2016-07-13 NOTE — Progress Notes (Signed)
Office Visit Note   Patient: Kathleen Walsh           Date of Birth: Jan 26, 1981           MRN: [id number] Visit Date: 07/13/2016              Requested by: Cyndi Bender, PA-C [address] [place], [place] [address] PCP: [place]: Visit Diagnoses:  1. Pain in left ankle and joints of left foot   2. Chronic left-sided low back pain with left-sided sciatica   3. Pain in left hip   4. Trochanteric bursitis, left hip   5. Arthritis of left hip     Plan: Advised patient that her foot and ankle is more of a complex problem. By history and exam she is describing problems with impingement from dorsal foot spurs, posterior/peroneal tendinitis, Achilles bursitis and Achilles tendinitis. We'll schedule appointment to see Dr. Sharol Given for this. In regards to her left lateral hip pain she was given IT band stretching exercises. We could consider conservative treatment with trochanteric bursa Marcaine/Depo-Medrol injection the future if she does not get any relief.  Advised patient that I think her lateral hip pain is the result of altered gait pattern from chronic left foot and ankle issues. Follow Dr. Lorin Mercy in about 6 weeks for recheck but will return sooner if needed for her left hip pain.  Follow-Up Instructions: Return in about 1 week (around 07/20/2016) for Dr Sharol Given left ankle evaluation.   Orders:  Orders Placed This Encounter  Procedures  . XR Lumbar Spine 2-3 Views  . XR Ankle Complete Left  . XR HIP UNILAT W OR W/O PELVIS 2-3 VIEWS LEFT   No orders of the defined types were placed in this encounter.     Procedures: No procedures performed   Clinical Data: No additional findings.   Subjective: Chief Complaint  Patient presents with  . Left Ankle - Pain    Patient is having left ankle pain x several years.  She has a history of left ankle tendon/ligament surgery in 2008 per [facility].  When she weight bears on the left ankle she  feels a pulling or straining laterally at old incision.  It has hurt her ever since her surgery.  Also she has left troch pain and bilateral lower back pain.  Denies n/t, but her entire left leg will give way at times.  She has been taking mobic for 1 1/2 months now.    Patient complaining of pain in the left lateral hip. Occasionally has some central low back discomfort but not too bad. No complaints of true lumbar radiculopathy. Left lateral hip pain aggravated when she is ambulating and lying on her left side. No groin pain. Lateral hip pain ongoing for about 6 months. No injury. States that left foot pain is more medial and dorsal. Marked discomfort in the plantar aspect of her heel when she first gets out of bed in the morning. States that she has not been back to see her physician who performed the lateral foot and ankle reconstruction back in 2008.  Review of Systems  Constitutional: Negative.   HENT: Negative.   Respiratory: Negative.   Gastrointestinal: Negative.   Musculoskeletal: Positive for arthralgias and back pain.  Neurological: Negative.   Psychiatric/Behavioral: Negative.      Objective: Vital Signs: BP (!) 158/84   Pulse 98   Ht 5\' 6"  (1.676 m)   Wt Marland Kitchen)  301 lb (136.5 kg)   LMP 08/31/2015   BMI 48.58 kg/m   Physical Exam  Constitutional: She is oriented to person, place, and time. No distress.  HENT:  Head: Normocephalic and atraumatic.  Eyes: EOM are normal. Pupils are equal, round, and reactive to light.  Neck: Normal range of motion.  Pulmonary/Chest: No respiratory distress.  Musculoskeletal:  Patient has a slight Trendelenburg gait. Left hip logroll causes pain over the lateral hip. No groin pain with this maneuver. She is moderate to markedly tender over the left hip greater trochanter bursa. Less tender on the right side. Negative straight leg raise. She has mild lumbar paraspinal tenderness. Neurovascularly intact. Left ankle she has good range of motion. She  does have a large surgical scar lateral foot and a small eschar dorsal foot. Over the dorsal talonavicular joint she does have topical spurs in this area is exquisitely tender. She's also tender over the posterior tibial and peroneal tendons. Tenderness over the plantar fascia calcaneal insertion, pre-Achilles bursa and Achilles tendon insertion.  Neurological: She is alert and oriented to person, place, and time.    Ortho Exam  Specialty Comments:  No specialty comments available.  Imaging: Xr Ankle Complete Left  Result Date: 07/13/2016 X-rays left ankle did not show any acute changes. loose bodies and spurring at the dorsal talonavicular joint. Some spurring at the anterior tibiotalar joint line.    Xr Hip Unilat W Or W/o Pelvis 2-3 Views Left  Result Date: 07/13/2016 X-ray left hip does show some degenerative changes with lateral acetabular spur.  Xr Lumbar Spine 2-3 Views  Result Date: 07/13/2016 X-rays lumbar spine lateral views show mild to level lumbar degenerative disc disease. No acute findings.    PMFS History: Patient Active Problem List   Diagnosis Date Noted  . Pain in left ankle and joints of left foot 07/13/2016  . Chronic left-sided low back pain with left-sided sciatica 07/13/2016  . Abnormal EKG 04/12/2016  . Cardiomegaly 08/15/2013  . SOB (shortness of breath) 08/15/2013  . Pleuritic chest pain 08/15/2013  . Acute bronchitis 08/15/2013  . Postpartum care following cesarean delivery and tubal sterilization (10/13) 02/11/2013  . Abdominal muscle strain 12/20/2011  . Constipation 07/06/2011  . Abdominal pain 07/06/2011  . DIABETES MELLITUS, GESTATIONAL 04/17/2009  . DYSPNEA 04/17/2009   Past Medical History:  Diagnosis Date  . Abdominal wall pain   . Anemia   . Anxiety   . Arthritis    left ankle  . Blood dyscrasia    lupus anticoagulant during pregnancy  . Depression    takes cymbalta   . Headache(784.0)    migraines  . Sleep apnea    USES  C-PAP    Family History  Problem Relation Age of Onset  . Diabetes Mother   . Hypertension Mother   . Diabetes Father   . Hypertension Father     Past Surgical History:  Procedure Laterality Date  . ABDOMINAL HYSTERECTOMY    . CESAREAN SECTION WITH BILATERAL TUBAL LIGATION Bilateral 02/11/2013   Procedure: Repeat CESAREAN SECTION WITH BILATERAL TUBAL LIGATION;  Surgeon: Lovenia Kim, MD;  Location: [facility];  Service: Obstetrics;  Laterality: Bilateral;  EDD: 03/01/13  . DEBRIDEMENT OF ABDOMINAL WALL ABSCESS N/A 12/03/2014   Procedure: INCISION OF ABDOMINAL WALL LIPOMA;  Surgeon: Greer Pickerel, MD;  Location: [facility];  Service: General;  Laterality: N/A;  . DILATION AND CURETTAGE OF UTERUS  2004  . [place] N/A 11/19/2014  Procedure: DILATATION & CURETTAGE/HYSTEROSCOPY WITH NOVASURE ABLATION;  Surgeon: Brien Few, MD;  Location: [facility];  Service: Gynecology;  Laterality: N/A;  . ENDOMETRIAL ABLATION  10/20/14  . FOOT SURGERY  2008  . HERNIA REPAIR  09/19/2011  . LAPAROSCOPY N/A 12/03/2014   Procedure: LAPAROSCOPY DIAGNOSTIC WITH LYSIS OF ADHESIONS;  Surgeon: Greer Pickerel, MD;  Location: [facility];  Service: General;  Laterality: N/A;  . TUBAL LIGATION    . VENTRAL HERNIA REPAIR  09/20/10   Laparoscopic, Dr Greer Pickerel  . WISDOM TOOTH EXTRACTION     Social History   Occupational History  . Not on file.   Social History Main Topics  . Smoking status: Current Every Day Smoker    Packs/day: 0.30    Types: Cigarettes  . Smokeless tobacco: Never Used  . Alcohol use No     Comment: "once every three years"   . Drug use: No  . Sexual activity: Yes    Birth control/ protection: Surgical

## 2016-07-18 DIAGNOSIS — N644 Mastodynia: Secondary | ICD-10-CM | POA: Diagnosis not present

## 2016-07-20 ENCOUNTER — Encounter (INDEPENDENT_AMBULATORY_CARE_PROVIDER_SITE_OTHER): Payer: Self-pay | Admitting: Orthopedic Surgery

## 2016-07-20 ENCOUNTER — Ambulatory Visit (INDEPENDENT_AMBULATORY_CARE_PROVIDER_SITE_OTHER): Payer: BLUE CROSS/BLUE SHIELD | Admitting: Orthopedic Surgery

## 2016-07-20 DIAGNOSIS — Q742 Other congenital malformations of lower limb(s), including pelvic girdle: Secondary | ICD-10-CM | POA: Diagnosis not present

## 2016-07-20 DIAGNOSIS — M25871 Other specified joint disorders, right ankle and foot: Secondary | ICD-10-CM

## 2016-07-20 DIAGNOSIS — M7751 Other enthesopathy of right foot: Secondary | ICD-10-CM | POA: Diagnosis not present

## 2016-07-20 DIAGNOSIS — M25872 Other specified joint disorders, left ankle and foot: Secondary | ICD-10-CM | POA: Insufficient documentation

## 2016-07-20 MED ORDER — METHYLPREDNISOLONE ACETATE 40 MG/ML IJ SUSP
40.0000 mg | INTRAMUSCULAR | Status: AC | PRN
  Administered 2016-07-20: 40 mg via INTRA_ARTICULAR

## 2016-07-20 MED ORDER — LIDOCAINE HCL 1 % IJ SOLN
2.0000 mL | INTRAMUSCULAR | Status: AC | PRN
  Administered 2016-07-20: 2 mL

## 2016-07-20 NOTE — Progress Notes (Signed)
Office Visit Note   Patient: Kathleen Walsh           Date of Birth: 1980-05-20           MRN: [id number] Visit Date: 07/20/2016              Requested by: [facility] [place], [place] [id number] PCP: [facility]  Chief Complaint  Patient presents with  . Left Ankle - Follow-up    Left Ankle, hx left ankle tendon ligament surgery at [facility].    BOF:BPZWCHE is a 35 year old woman who states that in 2008 she had some pain or kneel tendon lengthening at the [facility]. She also states that she had some bone spur removed with an incision that is slightly above the sinus Tarsi. She states she's had problems since surgery and has been having more symptoms since surgery. She works at [employer] she is on her feet unloading boxes and has pain with activities of daily living. She complains of swelling around the ankle pain medially around the ankle she feels some pulling on the top of her feet she states she has numbness dorsally to her toes. She states she did have some pain relief with using her fracture boot for 6 months. She states she's had injections but is not quite sure which joint the injections were in. She states she is unable to play with her children or do activities of daily living. HPI  Assessment & Plan: Visit Diagnoses:  1. Impingement syndrome of right ankle   2. Talonavicular coalition     Plan: The right ankle was injected from the anterior medial portal we'll see if the impingement symptoms or driving her pain reevaluate in 3 weeks in follow-up. She does have a large bony spur at the talonavicular joint and is symptomatic over this region. We may need to obtain a CT scan to better identify the location of this bony spur at the talonavicular joint dorsally.  Follow-Up Instructions: Return in about 3 weeks (around 08/10/2016).   Ortho Exam On examination patient is alert oriented no adenopathy well-dressed  normal affect normal respiratory effort she does have an antalgic gait she has pain with doing a single limb heel raise and this reproduces pain over the posterior tibial tendon as well as the peroneal tendons. She has pain with eversion. There is a scar over the peroneal tendons. She has a good dorsalis pedis and posterior tibial pulses she has pain with inversion and eversion she has pain to palpation in the sinus Tarsi she has pain to palpation anteriorly over the ankle she has pain with range of motion of the ankle. There is no redness no cellulitis no signs of infection. Her radiographs were reviewed which shows large osteophytic bone spurs dorsally over the talonavicular joint. ROS: Complete review of systems negative except as mentioned in the history of present illness. Imaging: No results found.  Labs: Lab Results  Component Value Date   ESRSEDRATE 10 11/12/2007   LABURIC 4.5 04/10/2009   REPTSTATUS 08/28/2010 FINAL 08/27/2010   CULT  08/27/2010    Multiple bacterial morphotypes present, none predominant. Suggest appropriate recollection if clinically indicated.    Orders:  Orders Placed This Encounter  Procedures  . Medium Joint Injection/Arthrocentesis   No orders of the defined types were placed in this encounter.    Procedures: Medium Joint Inj Date/Time: 07/20/2016 4:07 PM Performed by: Newt Minion Authorized by: Meridee Score  V   Consent Given by:  Patient Site marked: the procedure site was marked   Timeout: prior to procedure the correct patient, procedure, and site was verified   Indications:  Pain and diagnostic evaluation Location:  Ankle Site:  R ankle Prep: patient was prepped and draped in usual sterile fashion   Needle Size:  22 G Needle Length:  1.5 inches Approach:  Anteromedial Ultrasound Guided: No   Fluoroscopic Guidance: No   Medications:  2 mL lidocaine 1 %; 40 mg methylPREDNISolone acetate 40 MG/ML Aspiration Attempted: No   Patient  tolerance:  Patient tolerated the procedure well with no immediate complications    Clinical Data: No additional findings.  Subjective: Review of Systems  Objective: Vital Signs: LMP 08/31/2015   Specialty Comments:  No specialty comments available.  PMFS History: Patient Active Problem List   Diagnosis Date Noted  . Impingement syndrome of right ankle 07/20/2016  . 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 Pain in left ankle and joints of left foot 07/13/2016  . Chronic left-sided low back pain with left-sided sciatica 07/13/2016  . Abnormal EKG 04/12/2016  . Cardiomegaly 08/15/2013  . SOB (shortness of breath) 08/15/2013  . Pleuritic chest pain 08/15/2013  . Acute bronchitis 08/15/2013  . Postpartum care following cesarean delivery and tubal sterilization (10/13) 02/11/2013  . Abdominal muscle strain 12/20/2011  . Constipation 07/06/2011  . Abdominal pain 07/06/2011  . DIABETES MELLITUS, GESTATIONAL 04/17/2009  . DYSPNEA 04/17/2009   Past Medical History:  Diagnosis Date  . Abdominal wall pain   . Anemia   . Anxiety   . Arthritis    left ankle  . Blood dyscrasia    lupus anticoagulant during pregnancy  . Depression    takes cymbalta   . Headache(784.0)    migraines  . Sleep apnea    USES C-PAP    Family History  Problem Relation Age of Onset  . Diabetes Mother   . Hypertension Mother   . Diabetes Father   . Hypertension Father     Past Surgical History:  Procedure Laterality Date  . ABDOMINAL HYSTERECTOMY    . CESAREAN SECTION WITH BILATERAL TUBAL LIGATION Bilateral 02/11/2013   Procedure: Repeat CESAREAN SECTION WITH BILATERAL TUBAL LIGATION;  Surgeon: Lovenia Kim, MD;  Location: [facility];  Service: Obstetrics;  Laterality: Bilateral;  EDD: 03/01/13  . DEBRIDEMENT OF ABDOMINAL WALL ABSCESS N/A 12/03/2014   Procedure: INCISION OF ABDOMINAL WALL LIPOMA;  Surgeon: Greer Pickerel, MD;  Location: [facility];  Service: General;  Laterality: N/A;  . DILATION AND  CURETTAGE OF UTERUS  2004  . DILITATION & CURRETTAGE/HYSTROSCOPY WITH NOVASURE ABLATION N/A 11/19/2014   Procedure: DILATATION & CURETTAGE/HYSTEROSCOPY WITH NOVASURE ABLATION;  Surgeon: Brien Few, MD;  Location: [facility];  Service: Gynecology;  Laterality: N/A;  . ENDOMETRIAL ABLATION  10/20/14  . FOOT SURGERY  2008  . HERNIA REPAIR  09/19/2011  . LAPAROSCOPY N/A 12/03/2014   Procedure: LAPAROSCOPY DIAGNOSTIC WITH LYSIS OF ADHESIONS;  Surgeon: Greer Pickerel, MD;  Location: [facility];  Service: General;  Laterality: N/A;  . TUBAL LIGATION    . VENTRAL HERNIA REPAIR  09/20/10   Laparoscopic, Dr Greer Pickerel  . WISDOM TOOTH EXTRACTION     Social History   Occupational History  . Not on file.   Social History Main Topics  . Smoking status: Current Every Day Smoker    Packs/day: 0.30    Types: Cigarettes  . Smokeless tobacco: Never Used  . Alcohol use No  Comment: "once every three years"   . Drug use: No  . Sexual activity: Yes    Birth control/ protection: Surgical

## 2016-07-26 DIAGNOSIS — N6459 Other signs and symptoms in breast: Secondary | ICD-10-CM | POA: Diagnosis not present

## 2016-08-10 ENCOUNTER — Ambulatory Visit (INDEPENDENT_AMBULATORY_CARE_PROVIDER_SITE_OTHER): Payer: BLUE CROSS/BLUE SHIELD | Admitting: Orthopedic Surgery

## 2016-08-10 DIAGNOSIS — M25872 Other specified joint disorders, left ankle and foot: Secondary | ICD-10-CM

## 2016-08-10 DIAGNOSIS — M7752 Other enthesopathy of left foot: Secondary | ICD-10-CM

## 2016-08-10 NOTE — Progress Notes (Signed)
Office Visit Note   Patient: Kathleen Walsh           Date of Birth: 03/24/1981           MRN: [id number] Visit Date: 08/10/2016              Requested by: [facility] [place], [place] [address] PCP: [facility]  No chief complaint on file.     HPI: Patient is a 36 year old woman who is status post a subtalar surgery who presents with the worst pain in her left ankle. She did have a intra-articular injection which caused increased pain due to pressure and states the pain is same as it was now before the injection and it is not any better.  Assessment & Plan: Visit Diagnoses:  1. Impingement syndrome of left ankle     Plan: We will obtain an MRI scan of the left ankle to evaluate for any subchondral changes or osteochondral defects that are not visible on the plain radiographs. With this information would plan to proceed with a left ankle arthroscopy for debridement and decompression. Follow-up after the MRI is obtained.  Follow-Up Instructions: Return if symptoms worsen or fail to improve.   Ortho Exam  Patient is alert, oriented, no adenopathy, well-dressed, normal affect, normal respiratory effort. Examination patient has an antalgic gait. She has good pulses she does have some tenderness to palpation the sinus Tarsi and does have some tenderness with inversion eversion of the subtalar joint she has no limited range of motion no evidence of any subtalar bar. Patient has maximal plane with dorsiflexion of the ankle. She is tender to palpation over the entire anterior joint line. There is no redness no cellulitis  Imaging: No results found.  Labs: Lab Results  Component Value Date   ESRSEDRATE 10 11/12/2007   LABURIC 4.5 04/10/2009   REPTSTATUS 08/28/2010 FINAL 08/27/2010   CULT  08/27/2010    Multiple bacterial morphotypes present, none predominant. Suggest appropriate recollection if clinically indicated.     Orders:  No orders of the defined types were placed in this encounter.  No orders of the defined types were placed in this encounter.    Procedures: No procedures performed  Clinical Data: No additional findings.  ROS:  All other systems negative, except as noted in the HPI. Review of Systems  Objective: Vital Signs: LMP 08/31/2015   Specialty Comments:  No specialty comments available.  PMFS History: Patient Active Problem List   Diagnosis Date Noted  . Impingement syndrome of left ankle 07/20/2016  . 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 Pain in left ankle and joints of left foot 07/13/2016  . Chronic left-sided low back pain with left-sided sciatica 07/13/2016  . Abnormal EKG 04/12/2016  . Cardiomegaly 08/15/2013  . SOB (shortness of breath) 08/15/2013  . Pleuritic chest pain 08/15/2013  . Acute bronchitis 08/15/2013  . Postpartum care following cesarean delivery and tubal sterilization (10/13) 02/11/2013  . Abdominal muscle strain 12/20/2011  . Constipation 07/06/2011  . Abdominal pain 07/06/2011  . DIABETES MELLITUS, GESTATIONAL 04/17/2009  . DYSPNEA 04/17/2009   Past Medical History:  Diagnosis Date  . Abdominal wall pain   . Anemia   . Anxiety   . Arthritis    left ankle  . Blood dyscrasia    lupus anticoagulant during pregnancy  . Depression    takes cymbalta   . Headache(784.0)    migraines  . Sleep apnea    USES C-PAP  Family History  Problem Relation Age of Onset  . Diabetes Mother   . Hypertension Mother   . Diabetes Father   . Hypertension Father     Past Surgical History:  Procedure Laterality Date  . ABDOMINAL HYSTERECTOMY    . CESAREAN SECTION WITH BILATERAL TUBAL LIGATION Bilateral 02/11/2013   Procedure: Repeat CESAREAN SECTION WITH BILATERAL TUBAL LIGATION;  Surgeon: Lovenia Kim, MD;  Location: [facility];  Service: Obstetrics;  Laterality: Bilateral;  EDD: 03/01/13  . DEBRIDEMENT OF ABDOMINAL WALL ABSCESS N/A 12/03/2014    Procedure: INCISION OF ABDOMINAL WALL LIPOMA;  Surgeon: Greer Pickerel, MD;  Location: [facility];  Service: General;  Laterality: N/A;  . DILATION AND CURETTAGE OF UTERUS  2004  . DILITATION & CURRETTAGE/HYSTROSCOPY WITH NOVASURE ABLATION N/A 11/19/2014   Procedure: DILATATION & CURETTAGE/HYSTEROSCOPY WITH NOVASURE ABLATION;  Surgeon: Brien Few, MD;  Location: [facility];  Service: Gynecology;  Laterality: N/A;  . ENDOMETRIAL ABLATION  10/20/14  . FOOT SURGERY  2008  . HERNIA REPAIR  09/19/2011  . LAPAROSCOPY N/A 12/03/2014   Procedure: LAPAROSCOPY DIAGNOSTIC WITH LYSIS OF ADHESIONS;  Surgeon: Greer Pickerel, MD;  Location: [facility];  Service: General;  Laterality: N/A;  . TUBAL LIGATION    . VENTRAL HERNIA REPAIR  09/20/10   Laparoscopic, Dr Greer Pickerel  . WISDOM TOOTH EXTRACTION     Social History   Occupational History  . Not on file.   Social History Main Topics  . Smoking status: Current Every Day Smoker    Packs/day: 0.30    Types: Cigarettes  . Smokeless tobacco: Never Used  . Alcohol use No     Comment: "once every three years"   . Drug use: No  . Sexual activity: Yes    Birth control/ protection: Surgical

## 2016-08-12 ENCOUNTER — Encounter (INDEPENDENT_AMBULATORY_CARE_PROVIDER_SITE_OTHER): Payer: Self-pay | Admitting: Orthopaedic Surgery

## 2016-08-12 ENCOUNTER — Ambulatory Visit (INDEPENDENT_AMBULATORY_CARE_PROVIDER_SITE_OTHER): Payer: BLUE CROSS/BLUE SHIELD | Admitting: Orthopaedic Surgery

## 2016-08-12 VITALS — BP 137/78 | HR 84 | Ht 66.0 in | Wt 301.0 lb

## 2016-08-12 DIAGNOSIS — M7062 Trochanteric bursitis, left hip: Secondary | ICD-10-CM

## 2016-08-12 DIAGNOSIS — M5442 Lumbago with sciatica, left side: Secondary | ICD-10-CM

## 2016-08-12 NOTE — Progress Notes (Signed)
Office Visit Note   Patient: Kathleen Walsh           Date of Birth: 1980-12-12           MRN: [id number] Visit Date: 08/12/2016              Requested by: [facility] [place], [place] [address] PCP: [facility]   Assessment & Plan: Visit Diagnoses:  1. Trochanteric bursitis, left hip   2. Acute back pain with sciatica, left     Plan: Due to patient's ongoing back and leg symptoms with pain with activities of daily living, failure to respond ibuprofen will seek approval for MRI scan lumbar spine that she can get same time she gets her ankle imaged.  Follow-Up Instructions: Office follow-up after her MRI scan lumbar.  Orders:  Orders Placed This Encounter  Procedures  . MR Lumbar Spine w/o contrast   No orders of the defined types were placed in this encounter.     Procedures: No procedures performed   Clinical Data: No additional findings.   Subjective: Chief Complaint  Patient presents with  . Left Hip - Follow-up, Pain    HPI patient's been doing iliotibial band stretching exercises on the left side without relief and has persistent pain from her back buttocks down her leg. Previous subtalar surgery from [place]. Dr. Sharol Given has an MRI pending of her foot. She is use ibuprofen without relief of her back symptoms. Past history of ovarian cyst and MRI last year showed some disc bulge at L4-5 on a CT scan of the abdomen and possible disc protrusion at L3-4 and also L5-S1. This was not a dedicated CT scan of the spine but was of the abdomen. Pain does not wake her up she has hard time getting comfortable when she walks she has increased pain. She gets relief with supine position. No associated bowel or bladder symptoms.  Review of Systems  Constitutional: Negative for chills and diaphoresis.  HENT: Negative for ear discharge, ear pain and nosebleeds.   Eyes: Negative for discharge and visual disturbance.    Respiratory: Negative for cough, choking and shortness of breath.   Cardiovascular: Negative for chest pain and palpitations.  Gastrointestinal: Negative for abdominal distention and abdominal pain.  Endocrine: Negative for cold intolerance and heat intolerance.  Genitourinary: Negative for flank pain and hematuria.       History ovarian cyst with abdominal pain in the past.  Musculoskeletal: Positive for back pain, gait problem and joint swelling.  Skin: Negative for rash and wound.  Neurological: Negative for seizures and speech difficulty.  Hematological: Negative for adenopathy. Does not bruise/bleed easily.  Psychiatric/Behavioral: Negative for agitation and suicidal ideas.     Objective: Vital Signs: BP 137/78   Pulse 84   Ht 5\' 6"  (1.676 m)   Wt (!) 301 lb (136.5 kg)   LMP 08/31/2015   BMI 48.58 kg/m   Physical Exam  Constitutional: She is oriented to person, place, and time. She appears well-developed.  HENT:  Head: Normocephalic.  Right Ear: External ear normal.  Left Ear: External ear normal.  Eyes: Pupils are equal, round, and reactive to light.  Neck: No tracheal deviation present. No thyromegaly present.  Cardiovascular: Normal rate.   Pulmonary/Chest: Effort normal.  Abdominal: Soft.  Musculoskeletal:  Healed incisions the left ankle anterolateral. She has sciatic notch tenderness on the left. Trochanteric bursa is mildly tender. Some pain with straight leg raising  at 38. Lateral tilting and rotation gives her pain. Negative Faber test. Internal and external rotation of both hips are symmetrical. Reflexes are 2+ and symmetrical, knee and ankle jerk. Anterior tib EHL takes good resistance. Tenderness of the anterior ankle joint and pain with subtalar motion on the left. Negative on the right.  Neurological: She is alert and oriented to person, place, and time.  Skin: Skin is warm and dry.  Psychiatric: She has a normal mood and affect. Her behavior is normal.     Ortho Exam  Specialty Comments:  No specialty comments available.  Imaging: No results found.   PMFS History: Patient Active Problem List   Diagnosis Date Noted  . Acute back pain with sciatica, left 08/14/2016  . Trochanteric bursitis, left hip 08/12/2016  . Impingement syndrome of left ankle 07/20/2016  . 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 Pain in left ankle and joints of left foot 07/13/2016  . Chronic left-sided low back pain with left-sided sciatica 07/13/2016  . Abnormal EKG 04/12/2016  . Cardiomegaly 08/15/2013  . SOB (shortness of breath) 08/15/2013  . Pleuritic chest pain 08/15/2013  . Acute bronchitis 08/15/2013  . Postpartum care following cesarean delivery and tubal sterilization (10/13) 02/11/2013  . Abdominal muscle strain 12/20/2011  . Constipation 07/06/2011  . Abdominal pain 07/06/2011  . DIABETES MELLITUS, GESTATIONAL 04/17/2009  . DYSPNEA 04/17/2009   Past Medical History:  Diagnosis Date  . Abdominal wall pain   . Anemia   . Anxiety   . Arthritis    left ankle  . Blood dyscrasia    lupus anticoagulant during pregnancy  . Depression    takes cymbalta   . Headache(784.0)    migraines  . Sleep apnea    USES C-PAP    Family History  Problem Relation Age of Onset  . Diabetes Mother   . Hypertension Mother   . Diabetes Father   . Hypertension Father     Past Surgical History:  Procedure Laterality Date  . ABDOMINAL HYSTERECTOMY    . CESAREAN SECTION WITH BILATERAL TUBAL LIGATION Bilateral 02/11/2013   Procedure: Repeat CESAREAN SECTION WITH BILATERAL TUBAL LIGATION;  Surgeon: Lovenia Kim, MD;  Location: [facility];  Service: Obstetrics;  Laterality: Bilateral;  EDD: 03/01/13  . DEBRIDEMENT OF ABDOMINAL WALL ABSCESS N/A 12/03/2014   Procedure: INCISION OF ABDOMINAL WALL LIPOMA;  Surgeon: Greer Pickerel, MD;  Location: [facility];  Service: General;  Laterality: N/A;  . DILATION AND CURETTAGE OF UTERUS  2004  . DILITATION &  CURRETTAGE/HYSTROSCOPY WITH NOVASURE ABLATION N/A 11/19/2014   Procedure: DILATATION & CURETTAGE/HYSTEROSCOPY WITH NOVASURE ABLATION;  Surgeon: Brien Few, MD;  Location: [facility];  Service: Gynecology;  Laterality: N/A;  . ENDOMETRIAL ABLATION  10/20/14  . FOOT SURGERY  2008  . HERNIA REPAIR  09/19/2011  . LAPAROSCOPY N/A 12/03/2014   Procedure: LAPAROSCOPY DIAGNOSTIC WITH LYSIS OF ADHESIONS;  Surgeon: Greer Pickerel, MD;  Location: [facility];  Service: General;  Laterality: N/A;  . TUBAL LIGATION    . VENTRAL HERNIA REPAIR  09/20/10   Laparoscopic, Dr Greer Pickerel  . WISDOM TOOTH EXTRACTION     Social History   Occupational History  . Not on file.   Social History Main Topics  . Smoking status: Current Every Day Smoker    Packs/day: 0.30    Types: Cigarettes  . Smokeless tobacco: Never Used  . Alcohol use No     Comment: "once every three years"   . Drug use: No  .  Sexual activity: Yes    Birth control/ protection: Surgical

## 2016-08-14 DIAGNOSIS — M5442 Lumbago with sciatica, left side: Secondary | ICD-10-CM | POA: Insufficient documentation

## 2016-08-21 ENCOUNTER — Ambulatory Visit
Admission: RE | Admit: 2016-08-21 | Discharge: 2016-08-21 | Disposition: A | Discharge status: Home / Self Care | Payer: BLUE CROSS/BLUE SHIELD | Source: Ambulatory Visit | Attending: Orthopedic Surgery | Admitting: Orthopedic Surgery

## 2016-08-21 DIAGNOSIS — M25872 Other specified joint disorders, left ankle and foot: Secondary | ICD-10-CM

## 2016-08-21 DIAGNOSIS — M7732 Calcaneal spur, left foot: Secondary | ICD-10-CM | POA: Diagnosis not present

## 2016-08-27 ENCOUNTER — Ambulatory Visit
Admission: RE | Admit: 2016-08-27 | Discharge: 2016-08-27 | Disposition: A | Discharge status: Home / Self Care | Payer: BLUE CROSS/BLUE SHIELD | Source: Ambulatory Visit | Attending: Orthopaedic Surgery | Admitting: Orthopaedic Surgery

## 2016-08-27 DIAGNOSIS — M47816 Spondylosis without myelopathy or radiculopathy, lumbar region: Secondary | ICD-10-CM | POA: Diagnosis not present

## 2016-08-27 DIAGNOSIS — M5442 Lumbago with sciatica, left side: Secondary | ICD-10-CM

## 2016-08-30 ENCOUNTER — Ambulatory Visit (INDEPENDENT_AMBULATORY_CARE_PROVIDER_SITE_OTHER): Payer: BLUE CROSS/BLUE SHIELD | Admitting: Orthopedic Surgery

## 2016-08-30 ENCOUNTER — Encounter (INDEPENDENT_AMBULATORY_CARE_PROVIDER_SITE_OTHER): Payer: Self-pay | Admitting: Orthopedic Surgery

## 2016-08-30 VITALS — Ht 66.0 in | Wt 301.0 lb

## 2016-08-30 DIAGNOSIS — M25872 Other specified joint disorders, left ankle and foot: Secondary | ICD-10-CM

## 2016-08-30 NOTE — Progress Notes (Signed)
Office Visit Note   Patient: Kathleen Walsh           Date of Birth: [birth year]           MRN: [id number] Visit Date: 08/30/2016              Requested by: [facility] [place], [place] [address] PCP: Cyndi Bender, PA-C  Chief Complaint  Patient presents with  . Left Ankle - Follow-up    MRI review left ankle      HPI: Patient is a 36 year old woman who has had temporary relief with injection of her left ankle. She is status post MRI scan she still has pain with activities of daily living pain anteriorly over her ankle.  Assessment & Plan: Visit Diagnoses:  1. Impingement syndrome of left ankle     Plan: We'll plan for left ankle arthroscopy for debridement of impingement. Discussed risks and benefits of surgery including infection neurovascular injury persistent pain and need for additional surgery. Discussed that improvement could be approximately 80%. Patient states she understands wishes to proceed at this time.  Follow-Up Instructions: Return in about 2 weeks (around 09/13/2016).   Ortho Exam  Patient is alert, oriented, no adenopathy, well-dressed, normal affect, normal respiratory effort. Examination patient has a normal gait. She has good pulses good ankle but subtalar motion she has pain reproduced with maximal plantar flexion dorsiflexion left ankle she has tenderness to palpation anteriorly over the ankle joint. The peroneal and posterior tibial tendon are nontender to palpation the Achilles tendon is nontender to palpation. There is no cellulitis no dystrophic changes.  Imaging: No results found.  Labs: Lab Results  Component Value Date   ESRSEDRATE 10 11/12/2007   LABURIC 4.5 04/10/2009   REPTSTATUS 08/28/2010 FINAL 08/27/2010   CULT  08/27/2010    Multiple bacterial morphotypes present, none predominant. Suggest appropriate recollection if clinically indicated.    Orders:  No orders of the defined types were placed  in this encounter.  No orders of the defined types were placed in this encounter.    Procedures: No procedures performed  Clinical Data: No additional findings.  ROS:  All other systems negative, except as noted in the HPI. Review of Systems  Objective: Vital Signs: Ht 5\' 6"  (1.676 m)   Wt (!) 301 lb (136.5 kg)   LMP 08/31/2015   BMI 48.58 kg/m   Specialty Comments:  No specialty comments available.  PMFS History: Patient Active Problem List   Diagnosis Date Noted  . Acute back pain with sciatica, left 08/14/2016  . Trochanteric bursitis, left hip 08/12/2016  . Impingement syndrome of left ankle 07/20/2016  . 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 Pain in left ankle and joints of left foot 07/13/2016  . Chronic left-sided low back pain with left-sided sciatica 07/13/2016  . Abnormal EKG 04/12/2016  . Cardiomegaly 08/15/2013  . SOB (shortness of breath) 08/15/2013  . Pleuritic chest pain 08/15/2013  . Acute bronchitis 08/15/2013  . Postpartum care following cesarean delivery and tubal sterilization (10/13) 02/11/2013  . Abdominal muscle strain 12/20/2011  . Constipation 07/06/2011  . Abdominal pain 07/06/2011  . DIABETES MELLITUS, GESTATIONAL 04/17/2009  . DYSPNEA 04/17/2009   Past Medical History:  Diagnosis Date  . Abdominal wall pain   . Anemia   . Anxiety   . Arthritis    left ankle  . Blood dyscrasia    lupus anticoagulant during pregnancy  . Depression    takes cymbalta   .  Headache(784.0)    migraines  . Sleep apnea    USES C-PAP    Family History  Problem Relation Age of Onset  . Diabetes Mother   . Hypertension Mother   . Diabetes Father   . Hypertension Father     Past Surgical History:  Procedure Laterality Date  . ABDOMINAL HYSTERECTOMY    . CESAREAN SECTION WITH BILATERAL TUBAL LIGATION Bilateral 02/11/2013   Procedure: Repeat CESAREAN SECTION WITH BILATERAL TUBAL LIGATION;  Surgeon: Lovenia Kim, MD;  Location: [facility];   Service: Obstetrics;  Laterality: Bilateral;  EDD: 03/01/13  . DEBRIDEMENT OF ABDOMINAL WALL ABSCESS N/A 12/03/2014   Procedure: INCISION OF ABDOMINAL WALL LIPOMA;  Surgeon: Greer Pickerel, MD;  Location: [facility];  Service: General;  Laterality: N/A;  . DILATION AND CURETTAGE OF UTERUS  2004  . DILITATION & CURRETTAGE/HYSTROSCOPY WITH NOVASURE ABLATION N/A 11/19/2014   Procedure: DILATATION & CURETTAGE/HYSTEROSCOPY WITH NOVASURE ABLATION;  Surgeon: Brien Few, MD;  Location: [facility];  Service: Gynecology;  Laterality: N/A;  . ENDOMETRIAL ABLATION  10/20/14  . FOOT SURGERY  2008  . HERNIA REPAIR  09/19/2011  . LAPAROSCOPY N/A 12/03/2014   Procedure: LAPAROSCOPY DIAGNOSTIC WITH LYSIS OF ADHESIONS;  Surgeon: Greer Pickerel, MD;  Location: [facility];  Service: General;  Laterality: N/A;  . TUBAL LIGATION    . VENTRAL HERNIA REPAIR  09/20/10   Laparoscopic, Dr Greer Pickerel  . WISDOM TOOTH EXTRACTION     Social History   Occupational History  . Not on file.   Social History Main Topics  . Smoking status: Current Every Day Smoker    Packs/day: 0.30    Types: Cigarettes  . Smokeless tobacco: Never Used  . Alcohol use No     Comment: "once every three years"   . Drug use: No  . Sexual activity: Yes    Birth control/ protection: Surgical

## 2016-09-13 DIAGNOSIS — M19172 Post-traumatic osteoarthritis, left ankle and foot: Secondary | ICD-10-CM | POA: Diagnosis not present

## 2016-09-13 DIAGNOSIS — G8918 Other acute postprocedural pain: Secondary | ICD-10-CM | POA: Diagnosis not present

## 2016-09-13 DIAGNOSIS — M659 Synovitis and tenosynovitis, unspecified: Secondary | ICD-10-CM | POA: Diagnosis not present

## 2016-09-13 DIAGNOSIS — M25872 Other specified joint disorders, left ankle and foot: Secondary | ICD-10-CM | POA: Diagnosis not present

## 2016-09-14 ENCOUNTER — Telehealth (INDEPENDENT_AMBULATORY_CARE_PROVIDER_SITE_OTHER): Payer: Self-pay | Admitting: Orthopedic Surgery

## 2016-09-14 NOTE — Telephone Encounter (Signed)
Also said that her pain medication is not working and she was wondering if she needed to take an extra dose or be prescribed something else.

## 2016-09-14 NOTE — Telephone Encounter (Signed)
Patient called and said that 3 of her toes on her left foot wont bend and she was wanting to speak with you about this. CB # [phone]

## 2016-09-14 NOTE — Telephone Encounter (Signed)
Called to advise patient she can increase her percocet from 1 po q 4 hours to 2 q hours due to severe pain. Patient unable to sleep due to pain. Advised to take aleve for breakthrough pain.Advised that she should continue to wiggle her toes. Coban was removed due to discomfort. Ace bandage applied for compression. She is elevating, and she will minimize weightbearing to get this swelling under control.

## 2016-09-20 ENCOUNTER — Ambulatory Visit (INDEPENDENT_AMBULATORY_CARE_PROVIDER_SITE_OTHER): Payer: BLUE CROSS/BLUE SHIELD | Admitting: Orthopaedic Surgery

## 2016-09-20 DIAGNOSIS — G4733 Obstructive sleep apnea (adult) (pediatric): Secondary | ICD-10-CM | POA: Diagnosis not present

## 2016-09-28 ENCOUNTER — Inpatient Hospital Stay (INDEPENDENT_AMBULATORY_CARE_PROVIDER_SITE_OTHER): Payer: BLUE CROSS/BLUE SHIELD | Admitting: Orthopedic Surgery

## 2016-09-28 ENCOUNTER — Ambulatory Visit (INDEPENDENT_AMBULATORY_CARE_PROVIDER_SITE_OTHER): Payer: BLUE CROSS/BLUE SHIELD | Admitting: Family

## 2016-09-28 ENCOUNTER — Encounter (INDEPENDENT_AMBULATORY_CARE_PROVIDER_SITE_OTHER): Payer: Self-pay | Admitting: Family

## 2016-09-28 VITALS — Ht 66.0 in | Wt 301.0 lb

## 2016-09-28 DIAGNOSIS — M25872 Other specified joint disorders, left ankle and foot: Secondary | ICD-10-CM

## 2016-09-28 DIAGNOSIS — M25572 Pain in left ankle and joints of left foot: Secondary | ICD-10-CM

## 2016-09-28 NOTE — Progress Notes (Signed)
Post-Op Visit Note   Patient: Kathleen Walsh           Date of Birth: 1980/10/17           MRN: [id number] Visit Date: 09/28/2016 PCP: Cyndi Bender, PA-C  Chief Complaint:  Chief Complaint  Patient presents with  . Left Ankle - Follow-up    Left ankle arthroscopy ~ 2 weeks post op    HPI:  The patient is a 23 rolled woman who is status post left ankle arthroscopy on May 15. Complains of continued swelling. States she is on multiple to resume her shoe wear due to swelling. She's been taking Percocet for pain. Complains of a little redness surrounding the sutures. Complains that her second toe continues to be tender and non-. Feels tingling and pins and needle sensation over the dorsum of her foot. No drainage.    Ortho Exam Portals are clean and dry. Sutures harvested today without incident. There is minimal swelling. There is no discoloration to the foot there Refill is brisk. Dorsiflexion intact.  Visit Diagnoses:  1. Impingement syndrome of left ankle   2. Pain of joint of left ankle and foot     Plan: Reassurance provided. She'll continue working on elevation and range of motion of the ankle. May use ice and compression garments for swelling and pain. She will remain out of work for 2 more weeks until follow-up.  Follow-Up Instructions: No Follow-up on file.   Imaging: No results found.  Orders:  No orders of the defined types were placed in this encounter.  No orders of the defined types were placed in this encounter.    PMFS History: Patient Active Problem List   Diagnosis Date Noted  . Acute back pain with sciatica, left 08/14/2016  . Trochanteric bursitis, left hip 08/12/2016  . Impingement syndrome of left ankle 07/20/2016  . 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 Pain in left ankle and joints of left foot 07/13/2016  . Chronic left-sided low back pain with left-sided sciatica 07/13/2016  . Abnormal EKG 04/12/2016  . Cardiomegaly 08/15/2013  . SOB  (shortness of breath) 08/15/2013  . Pleuritic chest pain 08/15/2013  . Acute bronchitis 08/15/2013  . Postpartum care following cesarean delivery and tubal sterilization (10/13) 02/11/2013  . Abdominal muscle strain 12/20/2011  . Constipation 07/06/2011  . Abdominal pain 07/06/2011  . DIABETES MELLITUS, GESTATIONAL 04/17/2009  . DYSPNEA 04/17/2009   Past Medical History:  Diagnosis Date  . Abdominal wall pain   . Anemia   . Anxiety   . Arthritis    left ankle  . Blood dyscrasia    lupus anticoagulant during pregnancy  . Depression    takes cymbalta   . Headache(784.0)    migraines  . Sleep apnea    USES C-PAP    Family History  Problem Relation Age of Onset  . Diabetes Mother   . Hypertension Mother   . Diabetes Father   . Hypertension Father     Past Surgical History:  Procedure Laterality Date  . ABDOMINAL HYSTERECTOMY    . CESAREAN SECTION WITH BILATERAL TUBAL LIGATION Bilateral 02/11/2013   Procedure: Repeat CESAREAN SECTION WITH BILATERAL TUBAL LIGATION;  Surgeon: Lovenia Kim, MD;  Location: [facility];  Service: Obstetrics;  Laterality: Bilateral;  EDD: 03/01/13  . DEBRIDEMENT OF ABDOMINAL WALL ABSCESS N/A 12/03/2014   Procedure: INCISION OF ABDOMINAL WALL LIPOMA;  Surgeon: Greer Pickerel, MD;  Location: [facility];  Service: General;  Laterality: N/A;  . DILATION  AND CURETTAGE OF UTERUS  2004  . DILITATION & CURRETTAGE/HYSTROSCOPY WITH NOVASURE ABLATION N/A 11/19/2014   Procedure: DILATATION & CURETTAGE/HYSTEROSCOPY WITH NOVASURE ABLATION;  Surgeon: Brien Few, MD;  Location: [facility];  Service: Gynecology;  Laterality: N/A;  . ENDOMETRIAL ABLATION  10/20/14  . FOOT SURGERY  2008  . HERNIA REPAIR  09/19/2011  . LAPAROSCOPY N/A 12/03/2014   Procedure: LAPAROSCOPY DIAGNOSTIC WITH LYSIS OF ADHESIONS;  Surgeon: Greer Pickerel, MD;  Location: [facility];  Service: General;  Laterality: N/A;  . TUBAL LIGATION    . VENTRAL HERNIA REPAIR  09/20/10   Laparoscopic, Dr Greer Pickerel  .  WISDOM TOOTH EXTRACTION     Social History   Occupational History  . Not on file.   Social History Main Topics  . Smoking status: Current Every Day Smoker    Packs/day: 0.30    Types: Cigarettes  . Smokeless tobacco: Never Used  . Alcohol use No     Comment: "once every three years"   . Drug use: No  . Sexual activity: Yes    Birth control/ protection: Surgical

## 2016-10-12 ENCOUNTER — Ambulatory Visit (INDEPENDENT_AMBULATORY_CARE_PROVIDER_SITE_OTHER): Payer: BLUE CROSS/BLUE SHIELD | Admitting: Family

## 2016-10-12 ENCOUNTER — Encounter (INDEPENDENT_AMBULATORY_CARE_PROVIDER_SITE_OTHER): Payer: Self-pay | Admitting: Family

## 2016-10-12 VITALS — Ht 66.0 in | Wt 301.0 lb

## 2016-10-12 DIAGNOSIS — M25872 Other specified joint disorders, left ankle and foot: Secondary | ICD-10-CM

## 2016-10-12 MED ORDER — GABAPENTIN 100 MG PO CAPS: 100.0000 mg | ORAL_CAPSULE | Freq: Three times a day (TID) | ORAL | 0 refills | Status: DC

## 2016-10-12 NOTE — Progress Notes (Signed)
Post-Op Visit Note   Patient: Kathleen Walsh           Date of Birth: 1980/07/12           MRN: [id number] Visit Date: 10/12/2016 PCP: Cyndi Bender, PA-C  Chief Complaint:  Chief Complaint  Patient presents with  . Left Ankle - Routine Post Op    09/13/16 left ankle scope    HPI:  The patient is a 36 year old woman who is status post left ankle arthroscopy on Sep 13, 2016. Complains of continued swelling and pain to the anteriolateral portal. Feels tingling and pins and needle sensation over the dorsum of her foot. States her foot is very sensitive, cannot wear shoes due to pain, the water is the shower hitting her foot makes it tingle.   Walking in today in flip flops with out a limp.    Ortho Exam Portals are clean and dry, are well healed. The anteriolateral portal is well healed, is tender. No erythema, no open area, no drainage. Not able to express any drainage. There is minimal swelling. There is no discoloration to the foot. cap Refill is brisk. Dorsiflexion intact. Ankle joint tender to palpation.  Visit Diagnoses:  1. Impingement syndrome of left ankle     Plan: She'll continue working on elevation and range of motion of the ankle. recommended compression garments for swelling and pain. She will remain out of work for 2 more weeks until follow-up. Wonder if CRPS. On cymbalta. Will order Gabapentin. Will discuss return to work at next visit, states is to return on 10/31/16.  Follow-Up Instructions: Return in about 2 weeks (around 10/28/2016).   Imaging: No results found.  Orders:  No orders of the defined types were placed in this encounter.  Meds ordered this encounter  Medications  . gabapentin (NEURONTIN) 100 MG capsule    Sig: Take 1 capsule (100 mg total) by mouth 3 (three) times daily.    Dispense:  90 capsule    Refill:  0     PMFS History: Patient Active Problem List   Diagnosis Date Noted  . Acute back pain with sciatica, left 08/14/2016  .  Trochanteric bursitis, left hip 08/12/2016  . Impingement syndrome of left ankle 07/20/2016  . 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 Pain in left ankle and joints of left foot 07/13/2016  . Chronic left-sided low back pain with left-sided sciatica 07/13/2016  . Abnormal EKG 04/12/2016  . Cardiomegaly 08/15/2013  . SOB (shortness of breath) 08/15/2013  . Pleuritic chest pain 08/15/2013  . Acute bronchitis 08/15/2013  . Postpartum care following cesarean delivery and tubal sterilization (10/13) 02/11/2013  . Abdominal muscle strain 12/20/2011  . Constipation 07/06/2011  . Abdominal pain 07/06/2011  . DIABETES MELLITUS, GESTATIONAL 04/17/2009  . DYSPNEA 04/17/2009   Past Medical History:  Diagnosis Date  . Abdominal wall pain   . Anemia   . Anxiety   . Arthritis    left ankle  . Blood dyscrasia    lupus anticoagulant during pregnancy  . Depression    takes cymbalta   . Headache(784.0)    migraines  . Sleep apnea    USES C-PAP    Family History  Problem Relation Age of Onset  . Diabetes Mother   . Hypertension Mother   . Diabetes Father   . Hypertension Father     Past Surgical History:  Procedure Laterality Date  . ABDOMINAL HYSTERECTOMY    . CESAREAN SECTION WITH BILATERAL TUBAL LIGATION  Bilateral 02/11/2013   Procedure: Repeat CESAREAN SECTION WITH BILATERAL TUBAL LIGATION;  Surgeon: Lovenia Kim, MD;  Location: [facility];  Service: Obstetrics;  Laterality: Bilateral;  EDD: 03/01/13  . DEBRIDEMENT OF ABDOMINAL WALL ABSCESS N/A 12/03/2014   Procedure: INCISION OF ABDOMINAL WALL LIPOMA;  Surgeon: Greer Pickerel, MD;  Location: [facility];  Service: General;  Laterality: N/A;  . DILATION AND CURETTAGE OF UTERUS  2004  . DILITATION & CURRETTAGE/HYSTROSCOPY WITH NOVASURE ABLATION N/A 11/19/2014   Procedure: DILATATION & CURETTAGE/HYSTEROSCOPY WITH NOVASURE ABLATION;  Surgeon: Brien Few, MD;  Location: [facility];  Service: Gynecology;  Laterality: N/A;  . ENDOMETRIAL ABLATION   10/20/14  . FOOT SURGERY  2008  . HERNIA REPAIR  09/19/2011  . LAPAROSCOPY N/A 12/03/2014   Procedure: LAPAROSCOPY DIAGNOSTIC WITH LYSIS OF ADHESIONS;  Surgeon: Greer Pickerel, MD;  Location: [facility];  Service: General;  Laterality: N/A;  . TUBAL LIGATION    . VENTRAL HERNIA REPAIR  09/20/10   Laparoscopic, Dr Greer Pickerel  . WISDOM TOOTH EXTRACTION     Social History   Occupational History  . Not on file.   Social History Main Topics  . Smoking status: Current Every Day Smoker    Packs/day: 0.30    Types: Cigarettes  . Smokeless tobacco: Never Used  . Alcohol use No     Comment: "once every three years"   . Drug use: No  . Sexual activity: Yes    Birth control/ protection: Surgical

## 2016-10-13 ENCOUNTER — Telehealth (INDEPENDENT_AMBULATORY_CARE_PROVIDER_SITE_OTHER): Payer: Self-pay | Admitting: Orthopedic Surgery

## 2016-10-13 NOTE — Telephone Encounter (Signed)
LAST 2 OV NOTES FAXED TO [facility] @ [facility] [phone]

## 2016-10-17 DIAGNOSIS — Z6841 Body Mass Index (BMI) 40.0 and over, adult: Secondary | ICD-10-CM | POA: Diagnosis not present

## 2016-10-17 DIAGNOSIS — L309 Dermatitis, unspecified: Secondary | ICD-10-CM | POA: Diagnosis not present

## 2016-10-28 ENCOUNTER — Encounter (INDEPENDENT_AMBULATORY_CARE_PROVIDER_SITE_OTHER): Payer: Self-pay | Admitting: Family

## 2016-10-28 ENCOUNTER — Ambulatory Visit (INDEPENDENT_AMBULATORY_CARE_PROVIDER_SITE_OTHER): Payer: BLUE CROSS/BLUE SHIELD | Admitting: Family

## 2016-10-28 VITALS — Ht 66.0 in | Wt 301.0 lb

## 2016-10-28 DIAGNOSIS — M25872 Other specified joint disorders, left ankle and foot: Secondary | ICD-10-CM

## 2016-10-28 DIAGNOSIS — M25572 Pain in left ankle and joints of left foot: Secondary | ICD-10-CM

## 2016-10-28 MED ORDER — PREGABALIN 75 MG PO CAPS: 75.0000 mg | ORAL_CAPSULE | Freq: Two times a day (BID) | ORAL | 0 refills | Status: DC

## 2016-10-28 MED ORDER — AMITRIPTYLINE HCL 10 MG PO TABS: 10.0000 mg | ORAL_TABLET | Freq: Every day | ORAL | 0 refills | Status: DC

## 2016-10-31 NOTE — Progress Notes (Signed)
Post-Op Visit Note   Patient: Kathleen Walsh           Date of Birth: 28-Apr-1981           MRN: [id number] Visit Date: 10/28/2016 PCP: Cyndi Bender, PA-C  Chief Complaint:  Chief Complaint  Patient presents with  . Left Ankle - Routine Post Op    09/13/16 left ankle scope    HPI:  The patient is a 36 year old woman who is status post left ankle arthroscopy on Sep 13, 2016. Complains of continued swelling and tenderness over dorsum of foot. Feels tingling and pins and needle sensation over the dorsum of her foot. States her foot is very sensitive.   Walking in today in flip flops.  Wonders if this is going to hurt like this forever.  complains of increased swelling to the left foot.   Ortho Exam Portals are clean and dry, are well healed. The anteriolateral portal is well healed, minimally tender. No erythema, no open area, no drainage. There is minimal swelling. There is no discoloration to the foot. cap Refill is brisk. Dorsiflexion intact. Ankle joint tender to palpation.  Visit Diagnoses:  1. Impingement syndrome of left ankle   2. Pain in left ankle and joints of left foot     Plan: She'll continue working on elevation and range of motion of the ankle. recommended compression garments for swelling and pain. She will remain out of work for 2 more weeks until follow-up. Wonder if CRPS. On cymbalta. Stop Gabapentin. Will start elavil and lyrica.   States will return to work.  Follow-Up Instructions: Return in about 3 weeks (around 11/18/2016) for c duda.   Imaging: No results found.  Orders:  No orders of the defined types were placed in this encounter.  Meds ordered this encounter  Medications  . amitriptyline (ELAVIL) 10 MG tablet    Sig: Take 1 tablet (10 mg total) by mouth at bedtime.    Dispense:  30 tablet    Refill:  0  . pregabalin (LYRICA) 75 MG capsule    Sig: Take 1 capsule (75 mg total) by mouth 2 (two) times daily.    Dispense:  60 capsule   Refill:  0     PMFS History: Patient Active Problem List   Diagnosis Date Noted  . Acute back pain with sciatica, left 08/14/2016  . Trochanteric bursitis, left hip 08/12/2016  . Impingement syndrome of left ankle 07/20/2016  . 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 Pain in left ankle and joints of left foot 07/13/2016  . Chronic left-sided low back pain with left-sided sciatica 07/13/2016  . Abnormal EKG 04/12/2016  . Cardiomegaly 08/15/2013  . SOB (shortness of breath) 08/15/2013  . Pleuritic chest pain 08/15/2013  . Acute bronchitis 08/15/2013  . Postpartum care following cesarean delivery and tubal sterilization (10/13) 02/11/2013  . Abdominal muscle strain 12/20/2011  . Constipation 07/06/2011  . Abdominal pain 07/06/2011  . DIABETES MELLITUS, GESTATIONAL 04/17/2009  . DYSPNEA 04/17/2009   Past Medical History:  Diagnosis Date  . Abdominal wall pain   . Anemia   . Anxiety   . Arthritis    left ankle  . Blood dyscrasia    lupus anticoagulant during pregnancy  . Depression    takes cymbalta   . Headache(784.0)    migraines  . Sleep apnea    USES C-PAP    Family History  Problem Relation Age of Onset  . Diabetes Mother   . Hypertension  Mother   . Diabetes Father   . Hypertension Father     Past Surgical History:  Procedure Laterality Date  . ABDOMINAL HYSTERECTOMY    . CESAREAN SECTION WITH BILATERAL TUBAL LIGATION Bilateral 02/11/2013   Procedure: Repeat CESAREAN SECTION WITH BILATERAL TUBAL LIGATION;  Surgeon: Lovenia Kim, MD;  Location: [facility];  Service: Obstetrics;  Laterality: Bilateral;  EDD: 03/01/13  . DEBRIDEMENT OF ABDOMINAL WALL ABSCESS N/A 12/03/2014   Procedure: INCISION OF ABDOMINAL WALL LIPOMA;  Surgeon: Greer Pickerel, MD;  Location: [facility];  Service: General;  Laterality: N/A;  . DILATION AND CURETTAGE OF UTERUS  2004  . DILITATION & CURRETTAGE/HYSTROSCOPY WITH NOVASURE ABLATION N/A 11/19/2014   Procedure: DILATATION & CURETTAGE/HYSTEROSCOPY  WITH NOVASURE ABLATION;  Surgeon: Brien Few, MD;  Location: [facility];  Service: Gynecology;  Laterality: N/A;  . ENDOMETRIAL ABLATION  10/20/14  . FOOT SURGERY  2008  . HERNIA REPAIR  09/19/2011  . LAPAROSCOPY N/A 12/03/2014   Procedure: LAPAROSCOPY DIAGNOSTIC WITH LYSIS OF ADHESIONS;  Surgeon: Greer Pickerel, MD;  Location: [facility];  Service: General;  Laterality: N/A;  . TUBAL LIGATION    . VENTRAL HERNIA REPAIR  09/20/10   Laparoscopic, Dr Greer Pickerel  . WISDOM TOOTH EXTRACTION     Social History   Occupational History  . Not on file.   Social History Main Topics  . Smoking status: Current Every Day Smoker    Packs/day: 0.30    Types: Cigarettes  . Smokeless tobacco: Never Used  . Alcohol use No     Comment: "once every three years"   . Drug use: No  . Sexual activity: Yes    Birth control/ protection: Surgical

## 2016-11-08 ENCOUNTER — Other Ambulatory Visit (INDEPENDENT_AMBULATORY_CARE_PROVIDER_SITE_OTHER): Payer: Self-pay | Admitting: Family

## 2016-11-10 ENCOUNTER — Encounter (INDEPENDENT_AMBULATORY_CARE_PROVIDER_SITE_OTHER): Payer: Self-pay | Admitting: Orthopedic Surgery

## 2016-11-10 ENCOUNTER — Ambulatory Visit (INDEPENDENT_AMBULATORY_CARE_PROVIDER_SITE_OTHER): Payer: BLUE CROSS/BLUE SHIELD | Admitting: Orthopedic Surgery

## 2016-11-10 DIAGNOSIS — M25872 Other specified joint disorders, left ankle and foot: Secondary | ICD-10-CM | POA: Diagnosis not present

## 2016-11-10 MED ORDER — NABUMETONE 750 MG PO TABS: 750.0000 mg | ORAL_TABLET | Freq: Two times a day (BID) | ORAL | 3 refills | Status: DC | PRN

## 2016-11-10 NOTE — Progress Notes (Signed)
Office Visit Note   Patient: Kathleen Walsh           Date of Birth: 1980-08-09           MRN: [id number] Visit Date: 11/10/2016              Requested by: Cyndi Bender, PA-C [address] [place], [place] [address] PCP: Cyndi Bender, PA-C  Chief Complaint  Patient presents with  . Left Ankle - Follow-up      HPI: Patient presents in follow-up for ankle arthroscopy for impingement. Patient states that her symptoms resolved when she is wearing a fracture boot she is currently wearing flip-flops and states that she has recurrent pain. She states she currently takes Elavil Lyrica and Cymbalta for her underlying symptoms. She states she is depressed all the time.  Assessment & Plan: Visit Diagnoses:  1. Impingement syndrome of left ankle     Plan: Prescription was called in for Relafen she had no relief with ibuprofen or Naprosyn. We will place her in an ankle stabilizing orthosis to provide better support the ankle recommended a stiff soled walking sneaker to also provide support for the subtalar joints.  Follow-Up Instructions: Return in about 3 weeks (around 12/01/2016).   Ortho Exam  Patient is alert, oriented, no adenopathy, well-dressed, normal affect, normal respiratory effort. Examination there is no redness no cellulitis no signs of infection. Patient does have some swelling. She has good pain-free passive range of motion of the ankle. There is no subtalar motion.  Imaging: No results found.  Labs: Lab Results  Component Value Date   ESRSEDRATE 10 11/12/2007   LABURIC 4.5 04/10/2009   REPTSTATUS 08/28/2010 FINAL 08/27/2010   CULT  08/27/2010    Multiple bacterial morphotypes present, none predominant. Suggest appropriate recollection if clinically indicated.    Orders:  No orders of the defined types were placed in this encounter.  Meds ordered this encounter  Medications  . nabumetone (RELAFEN) 750 MG tablet    Sig: Take 1 tablet (750 mg total) by mouth 2  (two) times daily as needed for mild pain or moderate pain. with food    Dispense:  60 tablet    Refill:  3     Procedures: No procedures performed  Clinical Data: No additional findings.  ROS:  All other systems negative, except as noted in the HPI. Review of Systems  Objective: Vital Signs: LMP 08/31/2015   Specialty Comments:  No specialty comments available.  PMFS History: Patient Active Problem List   Diagnosis Date Noted  . Acute back pain with sciatica, left 08/14/2016  . Trochanteric bursitis, left hip 08/12/2016  . Impingement syndrome of left ankle 07/20/2016  . 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 Pain in left ankle and joints of left foot 07/13/2016  . Chronic left-sided low back pain with left-sided sciatica 07/13/2016  . Abnormal EKG 04/12/2016  . Cardiomegaly 08/15/2013  . SOB (shortness of breath) 08/15/2013  . Pleuritic chest pain 08/15/2013  . Acute bronchitis 08/15/2013  . Postpartum care following cesarean delivery and tubal sterilization (10/13) 02/11/2013  . Abdominal muscle strain 12/20/2011  . Constipation 07/06/2011  . Abdominal pain 07/06/2011  . DIABETES MELLITUS, GESTATIONAL 04/17/2009  . DYSPNEA 04/17/2009   Past Medical History:  Diagnosis Date  . Abdominal wall pain   . Anemia   . Anxiety   . Arthritis    left ankle  . Blood dyscrasia    lupus anticoagulant during pregnancy  . Depression  takes cymbalta   . Headache(784.0)    migraines  . Sleep apnea    USES C-PAP    Family History  Problem Relation Age of Onset  . Diabetes Mother   . Hypertension Mother   . Diabetes Father   . Hypertension Father     Past Surgical History:  Procedure Laterality Date  . ABDOMINAL HYSTERECTOMY    . CESAREAN SECTION WITH BILATERAL TUBAL LIGATION Bilateral 02/11/2013   Procedure: Repeat CESAREAN SECTION WITH BILATERAL TUBAL LIGATION;  Surgeon: Lovenia Kim, MD;  Location: [facility];  Service: Obstetrics;  Laterality: Bilateral;   EDD: 03/01/13  . DEBRIDEMENT OF ABDOMINAL WALL ABSCESS N/A 12/03/2014   Procedure: INCISION OF ABDOMINAL WALL LIPOMA;  Surgeon: Greer Pickerel, MD;  Location: [facility];  Service: General;  Laterality: N/A;  . DILATION AND CURETTAGE OF UTERUS  2004  . DILITATION & CURRETTAGE/HYSTROSCOPY WITH NOVASURE ABLATION N/A 11/19/2014   Procedure: DILATATION & CURETTAGE/HYSTEROSCOPY WITH NOVASURE ABLATION;  Surgeon: Brien Few, MD;  Location: [facility];  Service: Gynecology;  Laterality: N/A;  . ENDOMETRIAL ABLATION  10/20/14  . FOOT SURGERY  2008  . HERNIA REPAIR  09/19/2011  . LAPAROSCOPY N/A 12/03/2014   Procedure: LAPAROSCOPY DIAGNOSTIC WITH LYSIS OF ADHESIONS;  Surgeon: Greer Pickerel, MD;  Location: [facility];  Service: General;  Laterality: N/A;  . TUBAL LIGATION    . VENTRAL HERNIA REPAIR  09/20/10   Laparoscopic, Dr Greer Pickerel  . WISDOM TOOTH EXTRACTION     Social History   Occupational History  . Not on file.   Social History Main Topics  . Smoking status: Current Every Day Smoker    Packs/day: 0.30    Types: Cigarettes  . Smokeless tobacco: Never Used  . Alcohol use No     Comment: "once every three years"   . Drug use: No  . Sexual activity: Yes    Birth control/ protection: Surgical

## 2016-11-24 ENCOUNTER — Other Ambulatory Visit (INDEPENDENT_AMBULATORY_CARE_PROVIDER_SITE_OTHER): Payer: Self-pay | Admitting: Family

## 2016-12-01 ENCOUNTER — Encounter (INDEPENDENT_AMBULATORY_CARE_PROVIDER_SITE_OTHER): Payer: Self-pay | Admitting: Family

## 2016-12-01 ENCOUNTER — Ambulatory Visit (INDEPENDENT_AMBULATORY_CARE_PROVIDER_SITE_OTHER): Payer: BLUE CROSS/BLUE SHIELD | Admitting: Family

## 2016-12-01 VITALS — Ht 66.0 in | Wt 301.0 lb

## 2016-12-01 DIAGNOSIS — M25572 Pain in left ankle and joints of left foot: Secondary | ICD-10-CM

## 2016-12-01 DIAGNOSIS — M25872 Other specified joint disorders, left ankle and foot: Secondary | ICD-10-CM

## 2016-12-01 DIAGNOSIS — G8929 Other chronic pain: Secondary | ICD-10-CM

## 2016-12-01 MED ORDER — OXYCODONE-ACETAMINOPHEN 5-325 MG PO TABS: 1.0000 | ORAL_TABLET | Freq: Four times a day (QID) | ORAL | 0 refills | Status: DC | PRN

## 2016-12-01 NOTE — Progress Notes (Signed)
Office Visit Note   Patient: Kathleen Walsh           Date of Birth: October 22, 1980           MRN: [id number] Visit Date: 12/01/2016              Requested by: Cyndi Bender, PA-C [address] [place], [place] [address] PCP: Cyndi Bender, PA-C  Chief Complaint  Patient presents with  . Left Ankle - Routine Post Op    09/13/16 left ankle scope      HPI: Patient presents in follow-up for ankle arthroscopy for impingement, nearly 3 months out.  She states she currently takes Elavil Lyrica and Cymbalta for her underlying symptoms. She states she is depressed all the time. Has worn ASO some. States She Is Not Wearing Them for the Last Week Because She's Had Too Much Swelling. Wonders If the Swelling Could Be Related to the Lyrica That She Is Taking. States is taking the Relafen as well now. Has gotten new asics walking shoes. 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  Assessment & Plan: Visit Diagnoses:  1. Chronic pain of left ankle   2. Impingement syndrome of left ankle     Plan:Recommended she continue her ankle stabilizing orthosis to provide better support the ankle. recommended a stiff soled walking sneaker to also provide support for the subtalar joints. Offered cortisone injection patient declined his has not had good relief from these in the past.  We'll provide her a 1 time prescription for Percocet for pain. have also placed a referral to pain management.  Follow-Up Instructions: Return in about 4 weeks (around 12/29/2016), or if symptoms worsen or fail to improve.   Ortho Exam  Patient is alert, oriented, no adenopathy, well-dressed, normal affect, normal respiratory effort. Examination there is no redness no cellulitis no signs of infection. Patient does have some swelling. Tenderness to the anterior joint  line, sinus Tarsi,  lateral ankle ligaments. She has good pain-free passive range of motion of the ankle. There is no subtalar motion.  Imaging: No results found.  Labs: Lab Results  Component Value Date   ESRSEDRATE 10 11/12/2007   LABURIC 4.5 04/10/2009   REPTSTATUS 08/28/2010 FINAL 08/27/2010   CULT  08/27/2010    Multiple bacterial morphotypes present, none predominant. Suggest appropriate recollection if clinically indicated.    Orders:  Orders Placed This Encounter  Procedures  . Ambulatory referral to Pain Clinic   Meds ordered this encounter  Medications  . oxyCODONE-acetaminophen (PERCOCET/ROXICET) 5-325 MG tablet    Sig: Take 1 tablet by mouth every 6 (six) hours as needed. for pain    Dispense:  60 tablet    Refill:  0     Procedures: No procedures performed  Clinical Data: No additional findings.  ROS:  All other systems negative, except as noted in the HPI. Review of Systems  Constitutional: Negative for chills and fever.  Musculoskeletal: Positive for arthralgias and joint swelling.  Skin: Negative for color change and wound.    Objective: Vital Signs: Ht 5\' 6"  (1.676 m)   Wt (!) 301 lb (136.5 kg)   LMP 08/31/2015   BMI 48.58 kg/m   Specialty Comments:  No specialty comments available.  PMFS History: Patient Active Problem List   Diagnosis Date Noted  . Acute back  pain with sciatica, left 08/14/2016  . Trochanteric bursitis, left hip 08/12/2016  . Impingement syndrome of left ankle 07/20/2016  . 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 Pain in left ankle and joints of left foot 07/13/2016  . Chronic left-sided low back pain with left-sided sciatica 07/13/2016  . Abnormal EKG 04/12/2016  . Cardiomegaly 08/15/2013  . SOB (shortness of breath) 08/15/2013  . Pleuritic chest pain 08/15/2013  . Acute bronchitis 08/15/2013  . Postpartum care following cesarean delivery and tubal sterilization (10/13) 02/11/2013  . Abdominal muscle strain  12/20/2011  . Constipation 07/06/2011  . Abdominal pain 07/06/2011  . DIABETES MELLITUS, GESTATIONAL 04/17/2009  . DYSPNEA 04/17/2009   Past Medical History:  Diagnosis Date  . Abdominal wall pain   . Anemia   . Anxiety   . Arthritis    left ankle  . Blood dyscrasia    lupus anticoagulant during pregnancy  . Depression    takes cymbalta   . Headache(784.0)    migraines  . Sleep apnea    USES C-PAP    Family History  Problem Relation Age of Onset  . Diabetes Mother   . Hypertension Mother   . Diabetes Father   . Hypertension Father     Past Surgical History:  Procedure Laterality Date  . ABDOMINAL HYSTERECTOMY    . CESAREAN SECTION WITH BILATERAL TUBAL LIGATION Bilateral 02/11/2013   Procedure: Repeat CESAREAN SECTION WITH BILATERAL TUBAL LIGATION;  Surgeon: Lovenia Kim, MD;  Location: [facility];  Service: Obstetrics;  Laterality: Bilateral;  EDD: 03/01/13  . DEBRIDEMENT OF ABDOMINAL WALL ABSCESS N/A 12/03/2014   Procedure: INCISION OF ABDOMINAL WALL LIPOMA;  Surgeon: Greer Pickerel, MD;  Location: [facility];  Service: General;  Laterality: N/A;  . DILATION AND CURETTAGE OF UTERUS  2004  . DILITATION & CURRETTAGE/HYSTROSCOPY WITH NOVASURE ABLATION N/A 11/19/2014   Procedure: DILATATION & CURETTAGE/HYSTEROSCOPY WITH NOVASURE ABLATION;  Surgeon: Brien Few, MD;  Location: [facility];  Service: Gynecology;  Laterality: N/A;  . ENDOMETRIAL ABLATION  10/20/14  . FOOT SURGERY  2008  . HERNIA REPAIR  09/19/2011  . LAPAROSCOPY N/A 12/03/2014   Procedure: LAPAROSCOPY DIAGNOSTIC WITH LYSIS OF ADHESIONS;  Surgeon: Greer Pickerel, MD;  Location: [facility];  Service: General;  Laterality: N/A;  . TUBAL LIGATION    . VENTRAL HERNIA REPAIR  09/20/10   Laparoscopic, Dr Greer Pickerel  . WISDOM TOOTH EXTRACTION     Social History   Occupational History  . Not on file.   Social History Main Topics  . Smoking status: Current Every Day Smoker    Packs/day: 0.30    Types: Cigarettes  . Smokeless  tobacco: Never Used  . Alcohol use No     Comment: "once every three years"   . Drug use: No  . Sexual activity: Yes    Birth control/ protection: Surgical

## 2016-12-06 DIAGNOSIS — Z79899 Other long term (current) drug therapy: Secondary | ICD-10-CM | POA: Diagnosis not present

## 2016-12-06 DIAGNOSIS — F418 Other specified anxiety disorders: Secondary | ICD-10-CM | POA: Diagnosis not present

## 2016-12-06 DIAGNOSIS — R7303 Prediabetes: Secondary | ICD-10-CM | POA: Diagnosis not present

## 2016-12-06 DIAGNOSIS — R600 Localized edema: Secondary | ICD-10-CM | POA: Diagnosis not present

## 2016-12-06 DIAGNOSIS — G43009 Migraine without aura, not intractable, without status migrainosus: Secondary | ICD-10-CM | POA: Diagnosis not present

## 2017-05-16 ENCOUNTER — Encounter: Payer: Self-pay | Admitting: Cardiology

## 2017-05-16 DIAGNOSIS — G43009 Migraine without aura, not intractable, without status migrainosus: Secondary | ICD-10-CM | POA: Diagnosis not present

## 2017-05-16 DIAGNOSIS — Z1331 Encounter for screening for depression: Secondary | ICD-10-CM | POA: Diagnosis not present

## 2017-05-16 DIAGNOSIS — Z720 Tobacco use: Secondary | ICD-10-CM | POA: Diagnosis not present

## 2017-05-16 DIAGNOSIS — R7303 Prediabetes: Secondary | ICD-10-CM | POA: Diagnosis not present

## 2017-05-16 DIAGNOSIS — R600 Localized edema: Secondary | ICD-10-CM | POA: Diagnosis not present

## 2017-06-09 DIAGNOSIS — Z6841 Body Mass Index (BMI) 40.0 and over, adult: Secondary | ICD-10-CM | POA: Diagnosis not present

## 2017-06-09 DIAGNOSIS — R6889 Other general symptoms and signs: Secondary | ICD-10-CM | POA: Diagnosis not present

## 2017-06-09 IMAGING — MR MR ANKLE*L* W/O CM
3 of 5 series · 9 of 40 positions shown · non-contrast
Comparison: Plain films left ankle 07/09/2006 and 12/31/2003.

CLINICAL DATA: Left ankle pain since surgery in 4446 due to
recurrent sprains. Initial encounter.

EXAM:
MRI OF THE LEFT ANKLE WITHOUT CONTRAST
TECHNIQUE: Multiplanar, multisequence MR imaging of the ankle was performed. No
intravenous contrast was administered.

[Series 3: PD fat-sat · axial · left · 4.0mm · 0.20mm/px · z∈[-67,+19]mm · 3 of 26 slices shown]
[im 4/26]
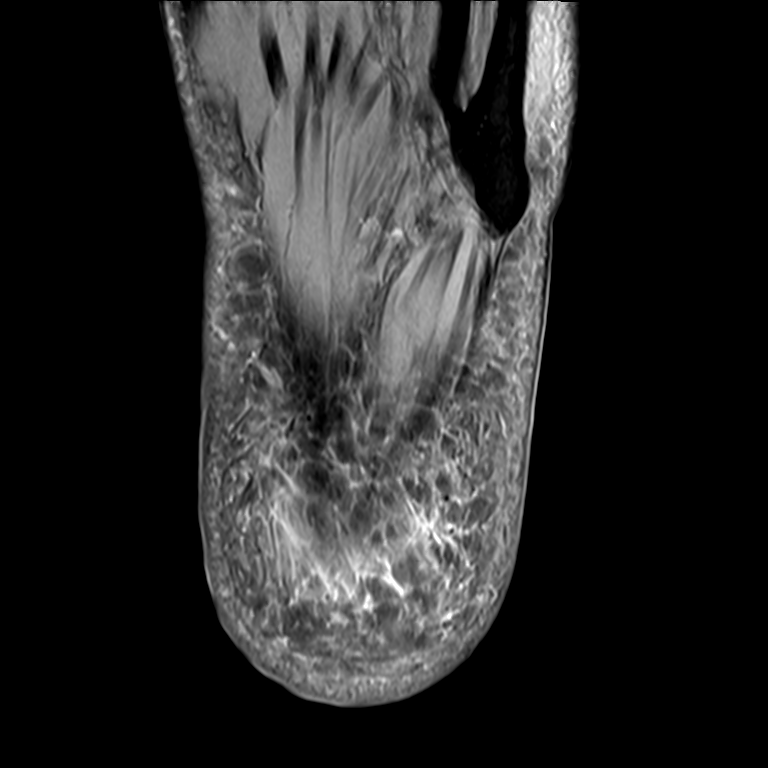
[im 15/26]
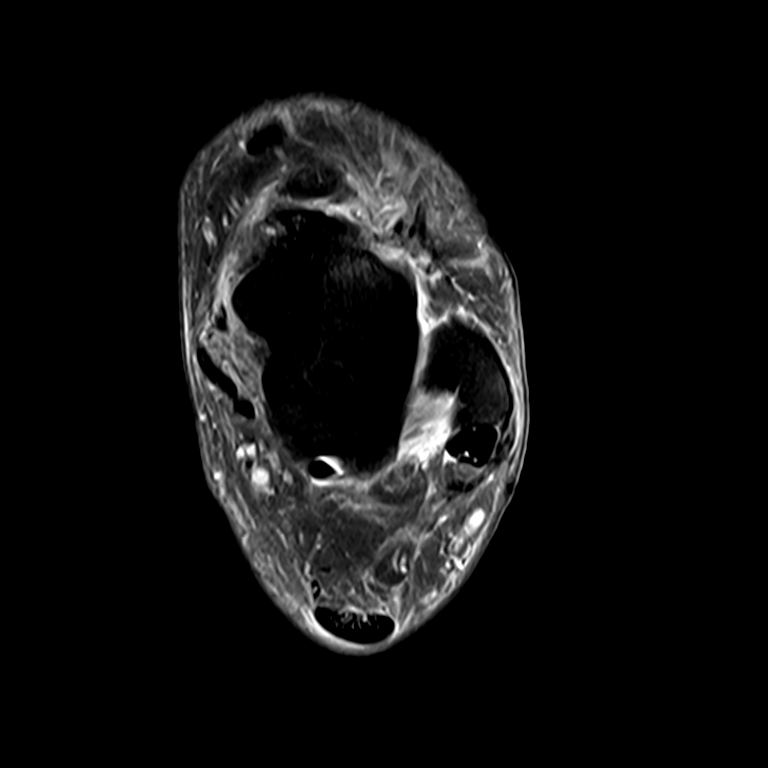
[im 22/26]
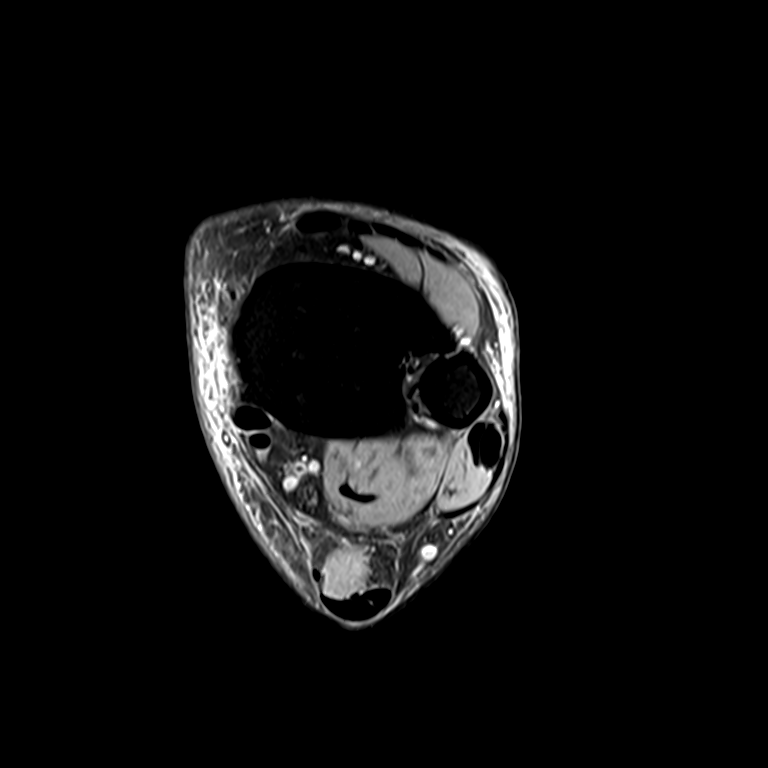

[Series 4: T2 fat-sat · axial · left · 4.0mm · 0.20mm/px · z∈[-62,+14]mm · 3 of 26 slices shown (1 of 2)]
[im 5/26]
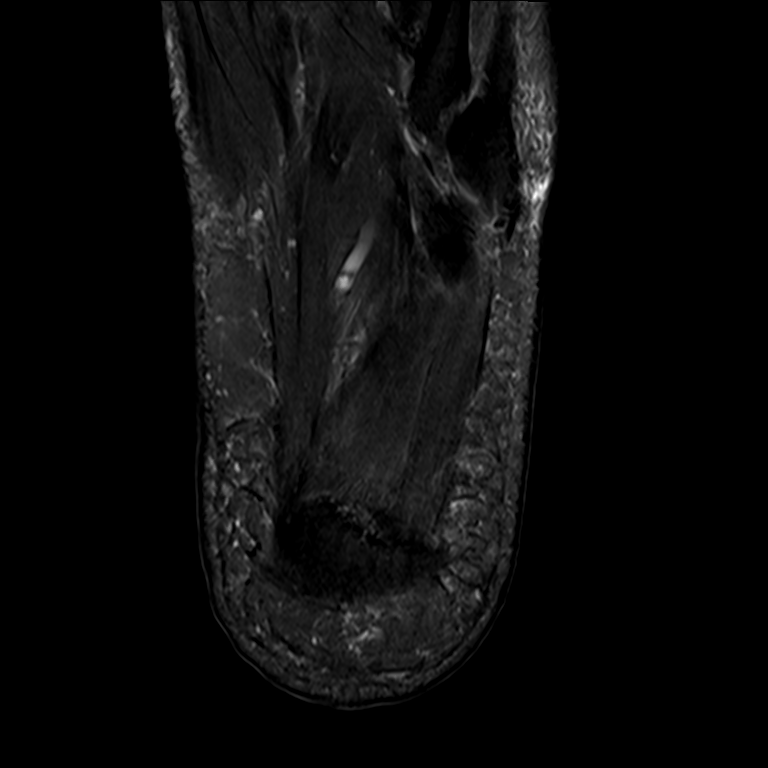
[im 13/26]
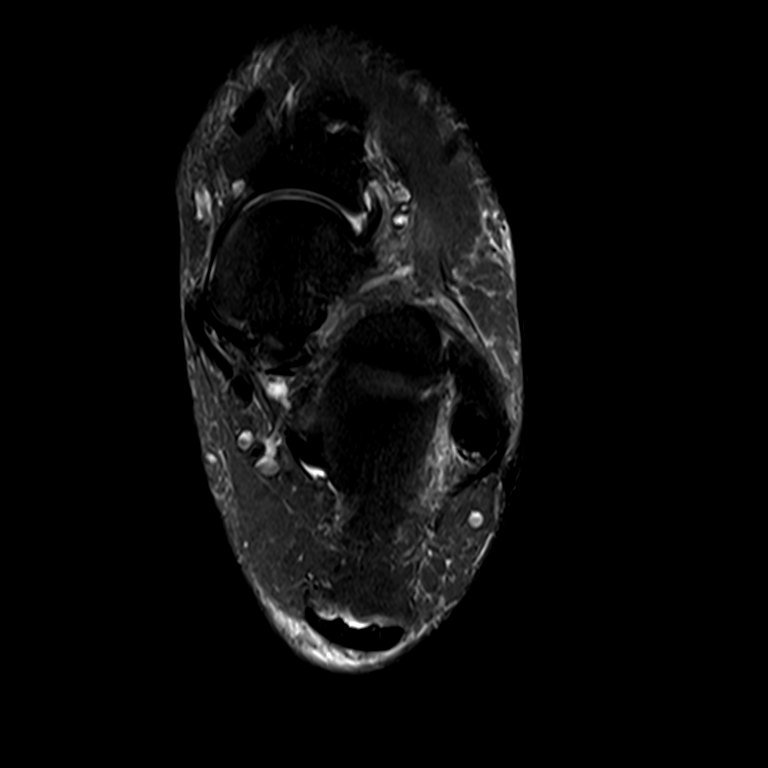
[im 21/26]
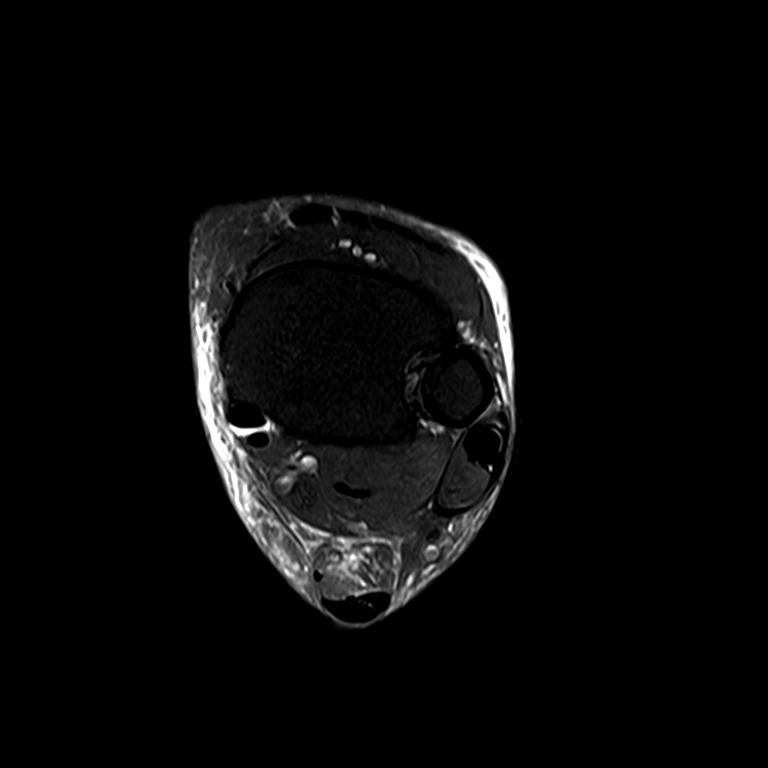

[Series 5: T2 fat-sat · sagittal · left · 2.5mm · 0.21mm/px · 3 of 30 slices shown (2 of 2)]
[im 5/30]
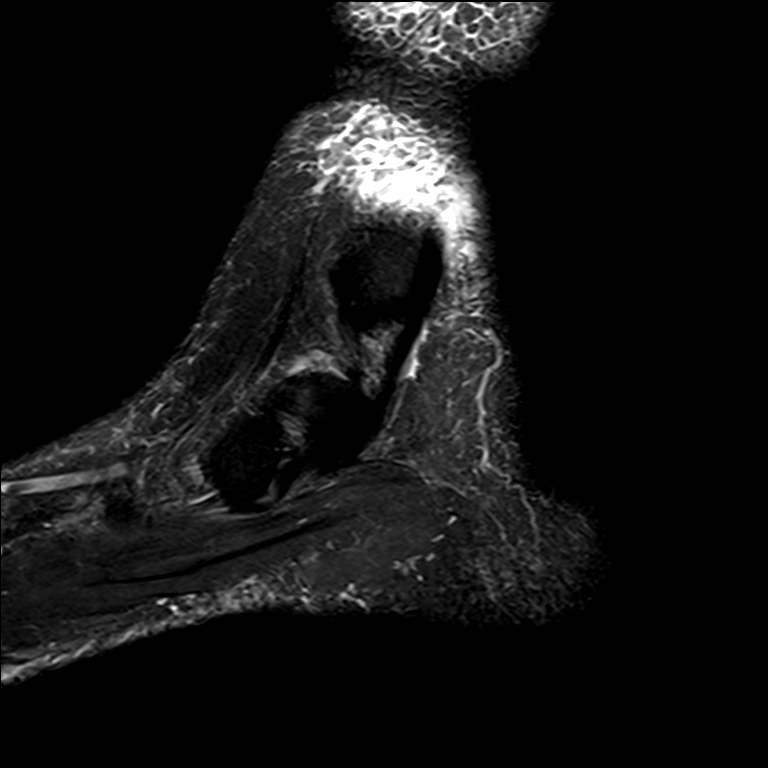
[im 17/30]
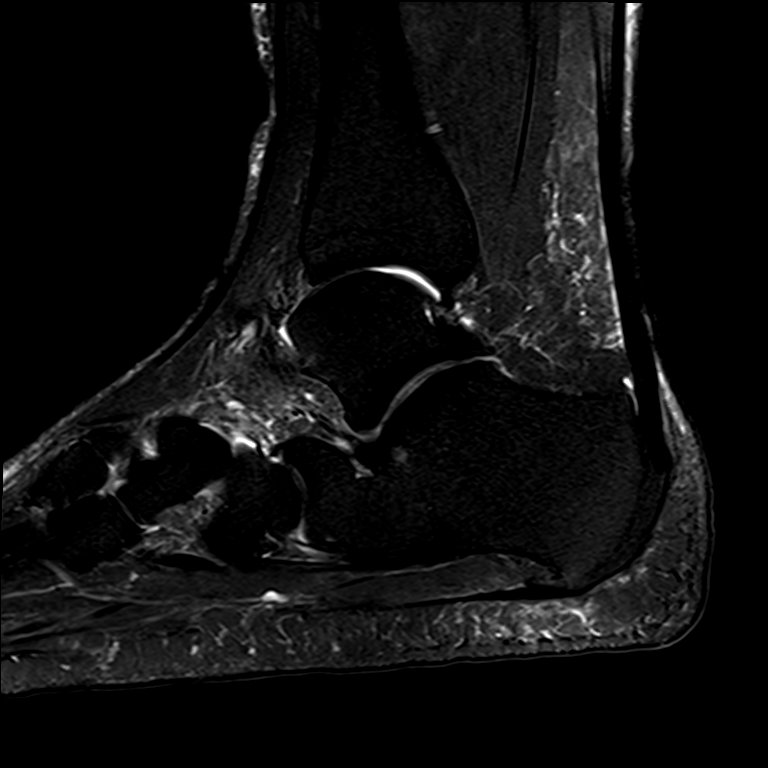
[im 25/30]
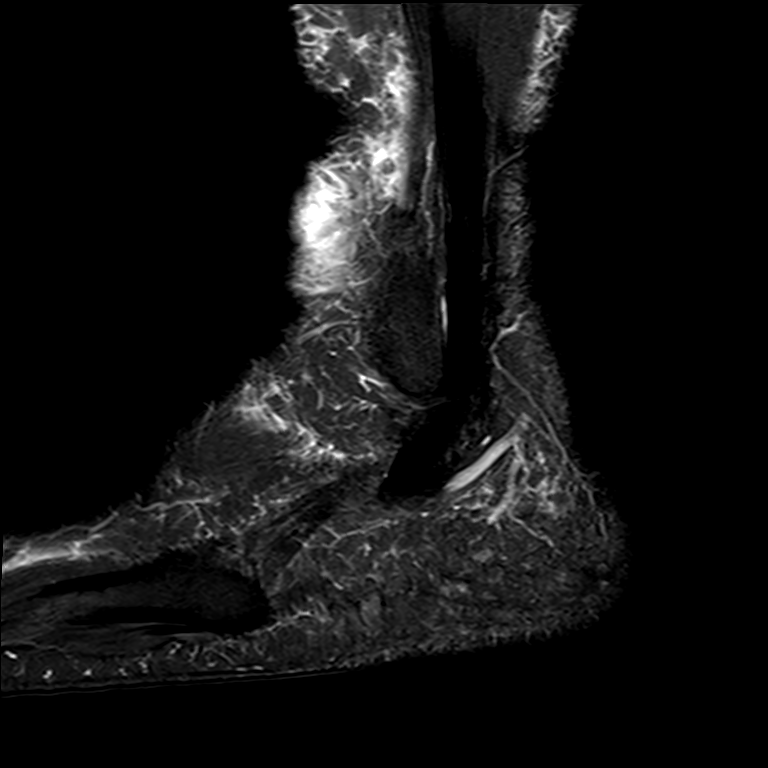

[9 of 40 positions shown; findings below may reference images not displayed]

FINDINGS: TENDONS

Peroneal: There is a short segment of mild intrasubstance increased
T2 signal within the peroneus longus as it passes subjacent to the
calcaneus consistent with tendinosis without tear. The peroneus
brevis appears normal.

Posteromedial: Intact.

Anterior: Intact.

Achilles: Intact.

Plantar Fascia: Appears normal.  Calcaneal spur noted.

LIGAMENTS

Lateral: Intact.

Medial: Intact.

CARTILAGE

Ankle Joint: Negative.

Subtalar Joints/Sinus Tarsi: Mild degenerative change is seen about
the middle facet of the subtalar joint. The sinus tarsi appears
normal.

Bones: There is talonavicular degenerative change with some
subchondral edema in the head of the talus and osteophytosis about
the joint. No fracture or worrisome lesion. No tarsal coalition is
identified.

Other: None.
IMPRESSION: Small segment of intrasubstance increased T2 signal in the peroneus
longus compatible with mild tendinosis without tear. Remaining
tendons about the ankle are intact. No ligament tear.

Moderate talonavicular osteoarthritis. Mild degenerative change
about the middle facet of the subtalar joint also noted.

Calcaneal spur without plantar fasciitis.

## 2017-06-10 ENCOUNTER — Other Ambulatory Visit: Payer: Self-pay

## 2017-06-10 ENCOUNTER — Emergency Department (HOSPITAL_COMMUNITY)
Admission: EM | Admit: 2017-06-10 | Discharge: 2017-06-10 | Disposition: A | Discharge status: Home / Self Care | Payer: BLUE CROSS/BLUE SHIELD | Attending: Emergency Medicine | Admitting: Emergency Medicine

## 2017-06-10 ENCOUNTER — Encounter (HOSPITAL_COMMUNITY): Payer: Self-pay | Admitting: *Deleted

## 2017-06-10 DIAGNOSIS — Z79899 Other long term (current) drug therapy: Secondary | ICD-10-CM | POA: Insufficient documentation

## 2017-06-10 DIAGNOSIS — F17210 Nicotine dependence, cigarettes, uncomplicated: Secondary | ICD-10-CM | POA: Insufficient documentation

## 2017-06-10 DIAGNOSIS — J029 Acute pharyngitis, unspecified: Secondary | ICD-10-CM | POA: Diagnosis not present

## 2017-06-10 DIAGNOSIS — R0981 Nasal congestion: Secondary | ICD-10-CM | POA: Diagnosis not present

## 2017-06-10 DIAGNOSIS — J069 Acute upper respiratory infection, unspecified: Secondary | ICD-10-CM | POA: Diagnosis not present

## 2017-06-10 DIAGNOSIS — R05 Cough: Secondary | ICD-10-CM | POA: Diagnosis not present

## 2017-06-10 LAB — RAPID STREP SCREEN (MED CTR MEBANE ONLY): Streptococcus, Group A Screen (Direct): NEGATIVE

## 2017-06-10 MED ORDER — PROMETHAZINE-DM 6.25-15 MG/5ML PO SYRP: 5.0000 mL | ORAL_SOLUTION | Freq: Four times a day (QID) | ORAL | 0 refills | Status: DC | PRN

## 2017-06-10 MED ORDER — PHENOL 1.4 % MT LIQD: 1.0000 | OROMUCOSAL | 0 refills | Status: DC | PRN

## 2017-06-10 NOTE — Discharge Instructions (Signed)
You likely have a viral illness.  This should be treated symptomatically. Use Tylenol or ibuprofen as needed for fevers or body aches. Continue taking tamiflu. Use Flonase daily for nasal congestion and cough. Use cough syrup and sore throat spray as needed. Make sure you stay well-hydrated with water. Wash your hands frequently to prevent spread of infection. Follow-up with your primary care doctor in 5 days if your symptoms are not improving. Return to the emergency room if you develop chest pain, difficulty breathing, or any new or worsening symptoms.

## 2017-06-10 NOTE — ED Triage Notes (Signed)
The pt has been ill since Thursday  She saw her doctor yesterday and her doctor is treating her for the flu although her flu test was negative  She is c/o a sore throat and shes sure she has the strep throat  lmp none

## 2017-06-10 NOTE — ED Provider Notes (Signed)
[facility] DEPART[facility] Provider Note   [name]: [id number] Arrival date & time: 06/10/17  1721     History   Chief Complaint Chief Complaint  Patient presents with  . Sore Throat    HPI Kathleen Walsh is a 37 y.o. female presenting for evaluation of 4 days of URI sxs.   Patient states for the past 4 days she has had nasal congestion, cough, and sore throat.  He cough is nonproductive. She was evaluated at her doctor's office yesterday, tested for the flu which was negative.  However, she was started on Tamiflu.  She presents today, as she is concerned she might have throat.  She reports she saw white patches on her tonsils.  She reports chills without fever.  She has been taking Tamiflu, nothing else for her symptoms.  She has not tried Tylenol, ibuprofen, cough syrup.  She reports her mother, daughter, and son are sick.  Her husband became sick yesterday as well. All with similar sxs.  She denies ear pain, eye pain, difficulty breathing, chest pain, shortness of breath, nausea, vomiting, abdominal pain, urinary symptoms, abnormal bowel movements.  Denies history of COPD or asthma.   HPI  Past Medical History:  Diagnosis Date  . Abdominal wall pain   . Anemia   . Anxiety   . Arthritis    left ankle  . Blood dyscrasia    lupus anticoagulant during pregnancy  . Depression    takes cymbalta   . Headache(784.0)    migraines  . Sleep apnea    USES C-PAP    Patient Active Problem List   Diagnosis Date Noted  . Acute back pain with sciatica, left 08/14/2016  . Trochanteric bursitis, left hip 08/12/2016  . Impingement syndrome of left ankle 07/20/2016  . 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 Pain in left ankle and joints of left foot 07/13/2016  . Chronic left-sided low back pain with left-sided sciatica 07/13/2016  . Abnormal EKG 04/12/2016  . Cardiomegaly 08/15/2013  . SOB (shortness of breath) 08/15/2013  . Pleuritic chest pain 08/15/2013  . Acute  bronchitis 08/15/2013  . Postpartum care following cesarean delivery and tubal sterilization (10/13) 02/11/2013  . Abdominal muscle strain 12/20/2011  . Constipation 07/06/2011  . Abdominal pain 07/06/2011  . DIABETES MELLITUS, GESTATIONAL 04/17/2009  . DYSPNEA 04/17/2009    Past Surgical History:  Procedure Laterality Date  . ABDOMINAL HYSTERECTOMY    . CESAREAN SECTION WITH BILATERAL TUBAL LIGATION Bilateral 02/11/2013   Procedure: Repeat CESAREAN SECTION WITH BILATERAL TUBAL LIGATION;  Surgeon: Lovenia Kim, MD;  Location: [facility];  Service: Obstetrics;  Laterality: Bilateral;  EDD: 03/01/13  . DEBRIDEMENT OF ABDOMINAL WALL ABSCESS N/A 12/03/2014   Procedure: INCISION OF ABDOMINAL WALL LIPOMA;  Surgeon: Greer Pickerel, MD;  Location: [facility];  Service: General;  Laterality: N/A;  . DILATION AND CURETTAGE OF UTERUS  2004  . DILITATION & CURRETTAGE/HYSTROSCOPY WITH NOVASURE ABLATION N/A 11/19/2014   Procedure: DILATATION & CURETTAGE/HYSTEROSCOPY WITH NOVASURE ABLATION;  Surgeon: Brien Few, MD;  Location: [facility];  Service: Gynecology;  Laterality: N/A;  . ENDOMETRIAL ABLATION  10/20/14  . FOOT SURGERY  2008  . HERNIA REPAIR  09/19/2011  . LAPAROSCOPY N/A 12/03/2014   Procedure: LAPAROSCOPY DIAGNOSTIC WITH LYSIS OF ADHESIONS;  Surgeon: Greer Pickerel, MD;  Location: [facility];  Service: General;  Laterality: N/A;  . TUBAL LIGATION    . VENTRAL HERNIA REPAIR  09/20/10   Laparoscopic, Dr Greer Pickerel  . WISDOM  TOOTH EXTRACTION      OB History    Gravida Para Term Preterm AB Living   4 2 2   2 2    SAB TAB Ectopic Multiple Live Births           1       Home Medications    Prior to Admission medications   Medication Sig Start Date End Date Taking? Authorizing Provider  amitriptyline (ELAVIL) 10 MG tablet TAKE 1 TABLET BY MOUTH EVERYDAY AT BEDTIME 11/24/16   Zamora, Junie Panning R, NP  CHANTIX STARTING MONTH PAK 0.5 MG X 11 & 1 MG X 42 tablet Take by mouth See admin instructions. 07/05/16   [provider]  diazepam (VALIUM) 5 MG tablet See admin instructions. 08/12/16   [provider]  DULoxetine (CYMBALTA) 60 MG capsule Take 60 mg by mouth 2 (two) times daily.     [provider]  furosemide (LASIX) 20 MG tablet Take 20 mg by mouth daily.    [provider]  gabapentin (NEURONTIN) 100 MG capsule TAKE 1 CAPSULE BY MOUTH THREE TIMES A DAY 11/08/16   Newt Minion, MD  ibuprofen (ADVIL,MOTRIN) 400 MG tablet Take 1 tablet (400 mg total) by mouth every 8 (eight) hours as needed. 06/23/16   Jola Schmidt, MD  oxyCODONE-acetaminophen (PERCOCET/ROXICET) 5-325 MG tablet Take 1 tablet by mouth every 6 (six) hours as needed. for pain 12/01/16   Suzan Slick, NP  phenol (CHLORASEPTIC) 1.4 % LIQD Use as directed 1 spray in the mouth or throat as needed for throat irritation / pain. 06/10/17   , , PA-C  pregabalin (LYRICA) 75 MG capsule Take 1 capsule (75 mg total) by mouth 2 (two) times daily. 10/28/16   Suzan Slick, NP  promethazine-dextromethorphan (PROMETHAZINE-DM) 6.25-15 MG/5ML syrup Take 5 mLs by mouth 4 (four) times daily as needed. 06/10/17   , , PA-C    Family History Family History  Problem Relation Age of Onset  . Diabetes Mother   . Hypertension Mother   . Diabetes Father   . Hypertension Father     Social History Social History   Tobacco Use  . Smoking status: Current Every Day Smoker    Packs/day: 0.30    Types: Cigarettes  . Smokeless tobacco: Never Used  Substance Use Topics  . Alcohol use: No    Comment: "once every three years"   . Drug use: No     Allergies   Amoxicillin   Review of Systems Review of Systems  Constitutional: Positive for chills. Negative for fever.  HENT: Positive for congestion and sore throat.   Respiratory: Positive for cough. Negative for chest tightness and shortness of breath.   Cardiovascular: Negative for chest pain.     Physical Exam Updated Vital Signs BP 139/88    Pulse 93   Temp 99.4 F (37.4 C)   Resp 18   Ht 5\' 6"  (1.676 m)   Wt 136.1 kg (300 lb)   LMP 08/31/2015   SpO2 98%   BMI 48.42 kg/m   Physical Exam  Constitutional: She is oriented to person, place, and time. She appears well-developed and well-nourished. No distress.  HENT:  Head: Normocephalic and atraumatic.  Right Ear: Tympanic membrane, external ear and ear canal normal.  Left Ear: Tympanic membrane, external ear and ear canal normal.  Nose: Mucosal edema present. Right sinus exhibits no maxillary sinus tenderness and no frontal sinus tenderness. Left sinus exhibits no maxillary sinus tenderness and  no frontal sinus tenderness.  Mouth/Throat: Uvula is midline and mucous membranes are normal. Posterior oropharyngeal erythema present. No oropharyngeal exudate. Tonsils are 1+ on the right. Tonsils are 1+ on the left. Tonsillar exudate.  Nasal mucosal edema. No TTP of the sinuses. OP mildly erythematous. White exudate seen bilaterally. Mild bilateral tonsillar swelling. Uvula midline with equal palate rise.  No tonsillar abscess noted.  Patient handling secretions easily.  No trismus.  TMs nonerythematous and not bulging bilaterally.  Eyes: Conjunctivae and EOM are normal. Pupils are equal, round, and reactive to light.  Neck: Normal range of motion.  Cardiovascular: Normal rate, regular rhythm and intact distal pulses.  Pulmonary/Chest: Effort normal and breath sounds normal. She has no decreased breath sounds. She has no wheezes. She has no rhonchi. She has no rales.  Pt speaking in full sentences without difficulty. Clear lung sounds in all fields  Abdominal: Soft. She exhibits no distension. There is no tenderness.  Musculoskeletal: Normal range of motion.  Lymphadenopathy:    She has no cervical adenopathy.  Neurological: She is alert and oriented to person, place, and time.  Skin: Skin is warm.  Psychiatric: She has a normal mood and affect.  Nursing note and vitals  reviewed.    ED Treatments / Results  Labs (all labs ordered are listed, but only abnormal results are displayed) Labs Reviewed  RAPID STREP SCREEN (NOT AT [facility])  CULTURE, GROUP A STREP [facility])    EKG  EKG Interpretation None       Radiology No results found.  Procedures Procedures (including critical care time)  Medications Ordered in ED Medications - No data to display   Initial Impression / Assessment and Plan / ED Course  I have reviewed the triage vital signs and the nursing notes.  Pertinent labs & imaging results that were available during my care of the patient were reviewed by me and considered in my medical decision making (see chart for details).     Patient presenting with 4 days URI symptoms.  Physical exam reassuring, patient is afebrile and appears nontoxic.  Pulmonary exam reassuring.  Doubt pneumonia, strep, other bacterial infection, or peritonsillar abscess. Rapid strep negative.  Likely viral URI.  Will treat symptomatically.  Patient to follow-up with primary care as needed.  At this time, patient appears safe for discharge.  Return precautions given.  Patient states she understands and agrees to plan.   Final Clinical Impressions(s) / ED Diagnoses   Final diagnoses:  Pharyngitis, unspecified etiology  Upper respiratory tract infection, unspecified type    ED Discharge Orders        Ordered    promethazine-dextromethorphan (PROMETHAZINE-DM) 6.25-15 MG/5ML syrup  4 times daily PRN     06/10/17 1824    phenol (CHLORASEPTIC) 1.4 % LIQD  As needed     06/10/17 Rondall Allegra, PA-C 06/10/17 1845    Franchot Heidelberg, PA-C 06/10/17 1852    Quintella Reichert, MD 06/11/17 [phone]

## 2017-06-10 NOTE — ED Notes (Signed)
Declined W/C at D/C and was escorted to lobby by RN. 

## 2017-06-13 LAB — CULTURE, GROUP A STREP (THRC)

## 2017-06-14 DIAGNOSIS — Z6841 Body Mass Index (BMI) 40.0 and over, adult: Secondary | ICD-10-CM | POA: Diagnosis not present

## 2017-06-14 DIAGNOSIS — H109 Unspecified conjunctivitis: Secondary | ICD-10-CM | POA: Diagnosis not present

## 2017-06-14 DIAGNOSIS — J069 Acute upper respiratory infection, unspecified: Secondary | ICD-10-CM | POA: Diagnosis not present

## 2017-06-20 ENCOUNTER — Ambulatory Visit: Payer: BLUE CROSS/BLUE SHIELD | Admitting: Physical Medicine & Rehabilitation

## 2017-07-04 ENCOUNTER — Ambulatory Visit: Payer: BLUE CROSS/BLUE SHIELD | Admitting: Physical Medicine & Rehabilitation

## 2017-07-10 DIAGNOSIS — M25572 Pain in left ankle and joints of left foot: Secondary | ICD-10-CM | POA: Diagnosis not present

## 2017-07-10 DIAGNOSIS — Z6841 Body Mass Index (BMI) 40.0 and over, adult: Secondary | ICD-10-CM | POA: Diagnosis not present

## 2017-07-10 DIAGNOSIS — M79672 Pain in left foot: Secondary | ICD-10-CM | POA: Diagnosis not present

## 2017-07-25 DIAGNOSIS — F418 Other specified anxiety disorders: Secondary | ICD-10-CM | POA: Diagnosis not present

## 2017-07-25 DIAGNOSIS — K029 Dental caries, unspecified: Secondary | ICD-10-CM | POA: Diagnosis not present

## 2017-07-25 DIAGNOSIS — L719 Rosacea, unspecified: Secondary | ICD-10-CM | POA: Diagnosis not present

## 2017-08-05 ENCOUNTER — Emergency Department (HOSPITAL_COMMUNITY): Payer: BLUE CROSS/BLUE SHIELD

## 2017-08-05 ENCOUNTER — Other Ambulatory Visit: Payer: Self-pay

## 2017-08-05 ENCOUNTER — Emergency Department (HOSPITAL_COMMUNITY)
Admission: EM | Admit: 2017-08-05 | Discharge: 2017-08-05 | Disposition: A | Discharge status: Home / Self Care | Payer: BLUE CROSS/BLUE SHIELD | Attending: Emergency Medicine | Admitting: Emergency Medicine

## 2017-08-05 ENCOUNTER — Encounter (HOSPITAL_COMMUNITY): Payer: Self-pay | Admitting: Emergency Medicine

## 2017-08-05 DIAGNOSIS — R1013 Epigastric pain: Secondary | ICD-10-CM | POA: Insufficient documentation

## 2017-08-05 DIAGNOSIS — R911 Solitary pulmonary nodule: Secondary | ICD-10-CM | POA: Insufficient documentation

## 2017-08-05 DIAGNOSIS — Z79899 Other long term (current) drug therapy: Secondary | ICD-10-CM | POA: Diagnosis not present

## 2017-08-05 DIAGNOSIS — R0602 Shortness of breath: Secondary | ICD-10-CM | POA: Diagnosis not present

## 2017-08-05 DIAGNOSIS — R0789 Other chest pain: Secondary | ICD-10-CM | POA: Diagnosis not present

## 2017-08-05 DIAGNOSIS — R079 Chest pain, unspecified: Secondary | ICD-10-CM | POA: Diagnosis not present

## 2017-08-05 DIAGNOSIS — F17210 Nicotine dependence, cigarettes, uncomplicated: Secondary | ICD-10-CM | POA: Diagnosis not present

## 2017-08-05 DIAGNOSIS — T148XXA Other injury of unspecified body region, initial encounter: Secondary | ICD-10-CM

## 2017-08-05 DIAGNOSIS — S29011A Strain of muscle and tendon of front wall of thorax, initial encounter: Secondary | ICD-10-CM | POA: Diagnosis not present

## 2017-08-05 DIAGNOSIS — R1011 Right upper quadrant pain: Secondary | ICD-10-CM | POA: Diagnosis not present

## 2017-08-05 LAB — LIPASE, BLOOD: LIPASE: 27 U/L (ref 11–51)

## 2017-08-05 LAB — CBC
HCT: 42 % (ref 36.0–46.0)
Hemoglobin: 13.4 g/dL (ref 12.0–15.0)
MCH: 27.2 pg (ref 26.0–34.0)
MCHC: 31.9 g/dL (ref 30.0–36.0)
MCV: 85.2 fL (ref 78.0–100.0)
PLATELETS: 248 10*3/uL (ref 150–400)
RBC: 4.93 MIL/uL (ref 3.87–5.11)
RDW: 14.8 % (ref 11.5–15.5)
WBC: 10.2 10*3/uL (ref 4.0–10.5)

## 2017-08-05 LAB — URINALYSIS, ROUTINE W REFLEX MICROSCOPIC
BILIRUBIN URINE: NEGATIVE
GLUCOSE, UA: NEGATIVE mg/dL
HGB URINE DIPSTICK: NEGATIVE
KETONES UR: NEGATIVE mg/dL
LEUKOCYTES UA: NEGATIVE
Nitrite: NEGATIVE
PH: 6 (ref 5.0–8.0)
Protein, ur: NEGATIVE mg/dL
Specific Gravity, Urine: 1.035 — ABNORMAL HIGH (ref 1.005–1.030)

## 2017-08-05 LAB — BASIC METABOLIC PANEL
Anion gap: 10 (ref 5–15)
BUN: 12 mg/dL (ref 6–20)
CO2: 21 mmol/L — ABNORMAL LOW (ref 22–32)
CREATININE: 0.66 mg/dL (ref 0.44–1.00)
Calcium: 8.5 mg/dL — ABNORMAL LOW (ref 8.9–10.3)
Chloride: 108 mmol/L (ref 101–111)
Glucose, Bld: 106 mg/dL — ABNORMAL HIGH (ref 65–99)
Potassium: 3.8 mmol/L (ref 3.5–5.1)
Sodium: 139 mmol/L (ref 135–145)

## 2017-08-05 LAB — HEPATIC FUNCTION PANEL
ALT: 19 U/L (ref 14–54)
AST: 19 U/L (ref 15–41)
Albumin: 3.4 g/dL — ABNORMAL LOW (ref 3.5–5.0)
Alkaline Phosphatase: 68 U/L (ref 38–126)
BILIRUBIN TOTAL: 0.4 mg/dL (ref 0.3–1.2)
Total Protein: 6.5 g/dL (ref 6.5–8.1)

## 2017-08-05 LAB — I-STAT TROPONIN, ED
TROPONIN I, POC: 0 ng/mL (ref 0.00–0.08)
Troponin i, poc: 0 ng/mL (ref 0.00–0.08)

## 2017-08-05 LAB — I-STAT BETA HCG BLOOD, ED (MC, WL, AP ONLY): I-stat hCG, quantitative: <5 m[IU]/mL (ref <5)

## 2017-08-05 MED ORDER — IOPAMIDOL (ISOVUE-370) INJECTION 76%: 100.0000 mL | Freq: Once | INTRAVENOUS | Status: DC | PRN

## 2017-08-05 MED ORDER — ONDANSETRON HCL 4 MG/2ML IJ SOLN
4.0000 mg | Freq: Once | INTRAMUSCULAR | Status: AC
  Administered 2017-08-05: 4 mg via INTRAVENOUS
  Filled 2017-08-05: qty 2

## 2017-08-05 MED ORDER — SODIUM CHLORIDE 0.9 % IV SOLN
80.0000 mg | Freq: Once | INTRAVENOUS | Status: AC
  Administered 2017-08-05: 17:00:00 80 mg via INTRAVENOUS
  Filled 2017-08-05: qty 80

## 2017-08-05 MED ORDER — CYCLOBENZAPRINE HCL 10 MG PO TABS
5.0000 mg | ORAL_TABLET | Freq: Once | ORAL | Status: AC
  Administered 2017-08-05: 5 mg via ORAL
  Filled 2017-08-05: qty 1

## 2017-08-05 MED ORDER — MORPHINE SULFATE (PF) 4 MG/ML IV SOLN
2.0000 mg | Freq: Once | INTRAVENOUS | Status: AC
  Administered 2017-08-05: 2 mg via INTRAVENOUS
  Filled 2017-08-05: qty 1

## 2017-08-05 MED ORDER — IOPAMIDOL (ISOVUE-370) INJECTION 76%
INTRAVENOUS | Status: AC
  Administered 2017-08-05: 100 mL
  Filled 2017-08-05: qty 100

## 2017-08-05 MED ORDER — HYDROMORPHONE HCL 1 MG/ML IJ SOLN
0.5000 mg | Freq: Once | INTRAMUSCULAR | Status: AC
  Administered 2017-08-05: 0.5 mg via INTRAVENOUS
  Filled 2017-08-05: qty 1

## 2017-08-05 MED ORDER — GI COCKTAIL ~~LOC~~
30.0000 mL | Freq: Once | ORAL | Status: AC
  Administered 2017-08-05: 30 mL via ORAL
  Filled 2017-08-05: qty 30

## 2017-08-05 MED ORDER — PANTOPRAZOLE SODIUM 20 MG PO TBEC: 20.0000 mg | DELAYED_RELEASE_TABLET | Freq: Every day | ORAL | 0 refills | Status: DC

## 2017-08-05 MED ORDER — ALBUTEROL SULFATE (2.5 MG/3ML) 0.083% IN NEBU
5.0000 mg | INHALATION_SOLUTION | Freq: Once | RESPIRATORY_TRACT | Status: AC
  Administered 2017-08-05: 5 mg via RESPIRATORY_TRACT
  Filled 2017-08-05: qty 6

## 2017-08-05 MED ORDER — KETOROLAC TROMETHAMINE 15 MG/ML IJ SOLN
15.0000 mg | Freq: Once | INTRAMUSCULAR | Status: AC
  Administered 2017-08-05: 15 mg via INTRAVENOUS
  Filled 2017-08-05: qty 1

## 2017-08-05 MED ORDER — HYDROMORPHONE HCL 1 MG/ML IJ SOLN
1.0000 mg | Freq: Once | INTRAMUSCULAR | Status: AC
  Administered 2017-08-05: 1 mg via INTRAVENOUS
  Filled 2017-08-05: qty 1

## 2017-08-05 MED ORDER — ONDANSETRON 8 MG PO TBDP: 8.0000 mg | ORAL_TABLET | Freq: Three times a day (TID) | ORAL | 0 refills | Status: DC | PRN

## 2017-08-05 MED ORDER — CAPSAICIN 0.025 % EX CREA
TOPICAL_CREAM | Freq: Once | CUTANEOUS | Status: DC
  Filled 2017-08-05: qty 60

## 2017-08-05 MED ORDER — ONDANSETRON 8 MG PO TBDP: 4.0000 mg | ORAL_TABLET | Freq: Three times a day (TID) | ORAL | 0 refills | Status: DC | PRN

## 2017-08-05 MED ORDER — CYCLOBENZAPRINE HCL 10 MG PO TABS: 5.0000 mg | ORAL_TABLET | Freq: Every day | ORAL | 0 refills | Status: DC

## 2017-08-05 NOTE — Discharge Instructions (Addendum)
Please take clear liquids and use zofran for nausea Call gastroenterology Monday morning for follow up.  You may use over-the-counter Motrin (Ibuprofen), Acetaminophen (Tylenol), topical muscle creams such as SalonPas, [employer], Bengay, etc. Please stretch, apply heat, and have massage therapy for additional assistance.   During the workup we noted incidental findings on your imaging that would require you to follow-up with your regular doctor for further evaluation/management:  -6 mm pleural based nodule involving the peripheral RIGHT upper lobe adjacent to the major fissure. If the patient is at low risk, follow-up CT in 6-12 months is recommended with consideration of a follow-up CT 1 year thereafter. If the patient is at high risk, follow-up CT at 6-12 months then a 2nd follow-up CT 1 year thereafter is recommended. This recommendation follows the consensus statement: Guidelines for Management of Small Pulmonary Nodules Detected on CT Images: From the Fleischner Society 2017; Radiology 2017; 284:228-243.

## 2017-08-05 NOTE — ED Provider Notes (Signed)
[facility] EMERGENCY DEPARTMENT Provider Note   [name]: [id number] Arrival date & time: 08/05/17  3710     History   Chief Complaint Chief Complaint  Patient presents with  . Chest Pain    HPI Kathleen Walsh is a 37 y.o. female.  HPI  37 year old female G4 P2 A2 status post hysterectomy presents today complaining of sudden onset of chest pain and dyspnea at approximately 5 AM.  She reports being well upon going to bed last night at 9:00.  The pain in her chest which she describes as squeezing and cramping and severe in nature awoke her with associated dyspnea which is worse with inspiration.  She has some nausea but no vomiting or diarrhea.  She has not noted lateralized leg swelling, history of DVT, or PE.  However she did have her upper teeth out 2 weeks ago.  She reports normal activity since that time with no long periods of inactivity.  She denies fever, chills, or exposure to respiratory infectious processes.  She has not had cough.  Had some periods of similar chest pain but without the dyspnea in the past.  Pain is worse with laying down and deep breathing.  She describes the pain is in the substernal area radiating more to the right.  Past Medical History:  Diagnosis Date  . Abdominal wall pain   . Anemia   . Anxiety   . Arthritis    left ankle  . Blood dyscrasia    lupus anticoagulant during pregnancy  . Depression    takes cymbalta   . Headache(784.0)    migraines  . Sleep apnea    USES C-PAP    Patient Active Problem List   Diagnosis Date Noted  . Acute back pain with sciatica, left 08/14/2016  . Trochanteric bursitis, left hip 08/12/2016  . Impingement syndrome of left ankle 07/20/2016  . 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 Pain in left ankle and joints of left foot 07/13/2016  . Chronic left-sided low back pain with left-sided sciatica 07/13/2016  . Abnormal EKG 04/12/2016  . Cardiomegaly 08/15/2013  . SOB (shortness of breath)  08/15/2013  . Pleuritic chest pain 08/15/2013  . Acute bronchitis 08/15/2013  . Postpartum care following cesarean delivery and tubal sterilization (10/13) 02/11/2013  . Abdominal muscle strain 12/20/2011  . Constipation 07/06/2011  . Abdominal pain 07/06/2011  . DIABETES MELLITUS, GESTATIONAL 04/17/2009  . DYSPNEA 04/17/2009    Past Surgical History:  Procedure Laterality Date  . ABDOMINAL HYSTERECTOMY    . CESAREAN SECTION WITH BILATERAL TUBAL LIGATION Bilateral 02/11/2013   Procedure: Repeat CESAREAN SECTION WITH BILATERAL TUBAL LIGATION;  Surgeon: Lovenia Kim, MD;  Location: [facility];  Service: Obstetrics;  Laterality: Bilateral;  EDD: 03/01/13  . DEBRIDEMENT OF ABDOMINAL WALL ABSCESS N/A 12/03/2014   Procedure: INCISION OF ABDOMINAL WALL LIPOMA;  Surgeon: Greer Pickerel, MD;  Location: [facility];  Service: General;  Laterality: N/A;  . DILATION AND CURETTAGE OF UTERUS  2004  . DILITATION & CURRETTAGE/HYSTROSCOPY WITH NOVASURE ABLATION N/A 11/19/2014   Procedure: DILATATION & CURETTAGE/HYSTEROSCOPY WITH NOVASURE ABLATION;  Surgeon: Brien Few, MD;  Location: [facility];  Service: Gynecology;  Laterality: N/A;  . ENDOMETRIAL ABLATION  10/20/14  . FOOT SURGERY  2008  . HERNIA REPAIR  09/19/2011  . LAPAROSCOPY N/A 12/03/2014   Procedure: LAPAROSCOPY DIAGNOSTIC WITH LYSIS OF ADHESIONS;  Surgeon: Greer Pickerel, MD;  Location: [facility];  Service: General;  Laterality: N/A;  . TUBAL LIGATION    .  VENTRAL HERNIA REPAIR  09/20/10   Laparoscopic, Dr Greer Pickerel  . WISDOM TOOTH EXTRACTION       OB History    Gravida  4   Para  2   Term  2   Preterm      AB  2   Living  2     SAB      TAB      Ectopic      Multiple      Live Births  1            Home Medications    Prior to Admission medications   Medication Sig Start Date End Date Taking? Authorizing Provider  amitriptyline (ELAVIL) 10 MG tablet TAKE 1 TABLET BY MOUTH EVERYDAY AT BEDTIME 11/24/16   Zamora, Junie Panning R, NP    CHANTIX STARTING MONTH PAK 0.5 MG X 11 & 1 MG X 42 tablet Take by mouth See admin instructions. 07/05/16   [provider]  diazepam (VALIUM) 5 MG tablet See admin instructions. 08/12/16   [provider]  DULoxetine (CYMBALTA) 60 MG capsule Take 60 mg by mouth 2 (two) times daily.     [provider]  furosemide (LASIX) 20 MG tablet Take 20 mg by mouth daily.    [provider]  gabapentin (NEURONTIN) 100 MG capsule TAKE 1 CAPSULE BY MOUTH THREE TIMES A DAY 11/08/16   Newt Minion, MD  ibuprofen (ADVIL,MOTRIN) 400 MG tablet Take 1 tablet (400 mg total) by mouth every 8 (eight) hours as needed. 06/23/16   Jola Schmidt, MD  oxyCODONE-acetaminophen (PERCOCET/ROXICET) 5-325 MG tablet Take 1 tablet by mouth every 6 (six) hours as needed. for pain 12/01/16   Suzan Slick, NP  phenol (CHLORASEPTIC) 1.4 % LIQD Use as directed 1 spray in the mouth or throat as needed for throat irritation / pain. 06/10/17   Caccavale, Sophia, PA-C  pregabalin (LYRICA) 75 MG capsule Take 1 capsule (75 mg total) by mouth 2 (two) times daily. 10/28/16   Suzan Slick, NP  promethazine-dextromethorphan (PROMETHAZINE-DM) 6.25-15 MG/5ML syrup Take 5 mLs by mouth 4 (four) times daily as needed. 06/10/17   Caccavale, Sophia, PA-C    Family History Family History  Problem Relation Age of Onset  . Diabetes Mother   . Hypertension Mother   . Diabetes Father   . Hypertension Father     Social History Social History   Tobacco Use  . Smoking status: Current Every Day Smoker    Packs/day: 0.30    Types: Cigarettes  . Smokeless tobacco: Never Used  Substance Use Topics  . Alcohol use: No    Comment: "once every three years"   . Drug use: No     Allergies   Amoxicillin   Review of Systems Review of Systems  Constitutional: Positive for appetite change.  HENT: Positive for dental problem. Negative for congestion, drooling, ear discharge, facial swelling, hearing loss and mouth sores.    Eyes: Negative.   Respiratory: Positive for chest tightness and shortness of breath.   Cardiovascular: Positive for chest pain. Negative for leg swelling.  Gastrointestinal: Positive for nausea. Negative for abdominal pain and vomiting.  Endocrine: Negative.   Genitourinary: Negative.   Musculoskeletal: Negative.   Skin: Negative.   Allergic/Immunologic: Negative.   Neurological: Negative.   Hematological: Negative.   Psychiatric/Behavioral: Negative.   All other systems reviewed and are negative.    Physical Exam Updated Vital Signs BP (!) 165/90 (BP Location: Right Arm)  Pulse 81   Temp 98 F (36.7 C) (Oral)   Resp 17   Ht 1.676 m (5\' 6" )   Wt (!) 138.8 kg (306 lb)   LMP 08/31/2015   SpO2 95%   BMI 49.39 kg/m   Physical Exam  Constitutional: She is oriented to person, place, and time. She appears well-developed and well-nourished. She appears ill. She appears distressed.  HENT:  Head: Normocephalic and atraumatic.  Eyes: Pupils are equal, round, and reactive to light. EOM are normal.  Neck: Normal range of motion. Neck supple.  Cardiovascular: Normal rate, regular rhythm and normal pulses.  Pulmonary/Chest: Effort normal and breath sounds normal. She has no decreased breath sounds.  Abdominal: Soft. She exhibits no distension and no mass. Bowel sounds are decreased. There is tenderness. There is no rebound and no guarding. No hernia.    Musculoskeletal: Normal range of motion. She exhibits no edema, tenderness or deformity.  Neurological: She is alert and oriented to person, place, and time.  Skin: Skin is warm and dry. Capillary refill takes less than 2 seconds.  Psychiatric: She has a normal mood and affect.  Nursing note and vitals reviewed.    ED Treatments / Results  Labs (all labs ordered are listed, but only abnormal results are displayed) Labs Reviewed  CBC  BASIC METABOLIC PANEL  HEPATIC FUNCTION PANEL  LIPASE, BLOOD  I-STAT TROPONIN, ED  I-STAT  BETA HCG BLOOD, ED ([facility], [facility], [facility] ONLY)    EKG EKG Interpretation  Date/Time:  Saturday August 05 2017 06:31:55 EDT Ventricular Rate:  79 PR Interval:  158 QRS Duration: 90 QT Interval:  408 QTC Calculation: 467 R Axis:   77 Text Interpretation:  Normal sinus rhythm Cannot rule out Anterior infarct , age undetermined Abnormal ECG When compared with ECG of 04/03/2016, Nonspecific ST abnormality is no longer present Confirmed by Delora Fuel ([id number]) on 08/05/2017 6:40:12 AM   Radiology Dg Chest 2 View  Result Date: 08/05/2017 CLINICAL DATA:  Centralized chest pain EXAM: CHEST - 2 VIEW COMPARISON:  06/05/2015 FINDINGS: The heart size and mediastinal contours are within normal limits. Both lungs are clear. The visualized skeletal structures are unremarkable. IMPRESSION: No active cardiopulmonary disease. Electronically Signed   By: Inez Catalina M.D.   On: 08/05/2017 07:23    Procedures Procedures (including critical care time)  Medications Ordered in ED Medications  HYDROmorphone (DILAUDID) injection 1 mg (has no administration in time range)  ondansetron (ZOFRAN) injection 4 mg (has no administration in time range)     Initial Impression / Assessment and Plan / ED Course  I have reviewed the triage vital signs and the nursing notes.  Pertinent labs & imaging results that were available during my care of the patient were reviewed by me and considered in my medical decision making (see chart for details).  Clinical Course as of Aug 05 1117  Sat Aug 05, 2017  0944 Reviewed results   [DR]  0945 reviewed  Basic metabolic panel(!) [DR]  9147 reviewed  CBC [DR]    Clinical Course User Index [DR] Pattricia Boss, MD   Chest pain- suspect intraabdominal etiology- low index suspicion acs, lungs clear-low suspicion infection, or pneumo.  Consider PE, but feel abdominal etiology more likely given exam.   Labs pending Patient being treated with pain meds and antiemetics 11:19 AM Reviewed,  ultrasound reviewed-no evidence of acute cholecystitis or pancreatitis.  Patient had some pain relief initially but states pain is returning.  Repeat Dilaudid ordered.  CT  angios chest and abdomen ordered to assess for pulmonary embolism and aortic dissection. 12:01 PM Troponin and repeat troponin normal 2:03 PM Patient continues to have some epigastric pain.  Delay in obtaining CT due to lack of IV access.  Patient now has IV access obtained large enough for IV dye.  CT is pending Viewed CT and discussed results including nodule, malrotation, accessory spleen with patient and husband and sister. Patient now states she has had this problem multiple times.  She continues to have some pain with tenderness palpation in the left upper quadrant.  She is nauseated but has not actually vomited.  Plan Protonix, capsaicin, and a GI cocktail.  Will then trial oral fluid.  Patient to be discharged with GI follow-up if able to tolerate fluids otherwise will need admission.  Final Clinical Impressions(s) / ED Diagnoses   Final diagnoses:  Epigastric pain  Pulmonary nodule    ED Discharge Orders    None       Pattricia Boss, MD 08/06/17 1411

## 2017-08-05 NOTE — ED Notes (Signed)
Patient transported to CT 

## 2017-08-05 NOTE — ED Notes (Signed)
Patient transported to Ultrasound 

## 2017-08-05 NOTE — ED Notes (Signed)
Pt ambulatory to the restroom to provide sample

## 2017-08-05 NOTE — ED Triage Notes (Signed)
Pt reports she has had centralized chest pain that moves outward across her chest.  This morning the pain woke her up out of a sleep.  Pt states she is nauseas and the pain is worse when she lays down.  Denies emesis, diaphoresis, lightheadedness or loc.

## 2017-08-05 NOTE — ED Notes (Signed)
Patient transported to US 

## 2017-08-05 NOTE — ED Provider Notes (Signed)
I assumed care of this patient from Dr. Jeanell Sparrow at 1700.  Please see their note for further details of Hx, PE.  Briefly patient is a 37 y.o. female who presented with bilateral chest and upper abd pain.  Extensive workup including right upper quadrant ultrasound and CTA were unremarkable for any acute process.  EKG was unremarkable.  Troponins negative.  Plan was to provide the patient with symptomatic treatment and IV fluids and reassess.  On my evaluation patient was still having pain.  She was tolerating oral hydration.  On repeat examination, she had a benign abdominal exam with no tenderness to palpation.  She did have tenderness to palpation to the  lower anterior chest wall bilaterally and bilateral mid thoracic back.  On further questioning, patient reports that she works at [employer] and does heavy lifting.  She felt that this might be due to a pulled muscle.  The symptoms do appear to be muscular in nature.  She was provided with Toradol and muscle relaxer.  I discussed additional symptomatic management.  We will provide the patient with prescription for muscle relaxers.  During workup patient had incidental findings which will be including in her Discharge summary.  The patient appears reasonably screened and/or stabilized for discharge and I doubt any other medical condition or other [facility] requiring further screening, evaluation, or treatment in the ED at this time prior to discharge.  The patient is safe for discharge with strict return precautions.  Disposition: Discharge  Condition: Good  I have discussed the results, Dx and Tx plan with the patient who expressed understanding and agree(s) with the plan. Discharge instructions discussed at great length. The patient was given strict return precautions who verbalized understanding of the instructions. No further questions at time of discharge.    ED Discharge Orders        Ordered    ondansetron (ZOFRAN ODT) 8 MG disintegrating tablet  Every 8  hours PRN,   Status:  Discontinued     08/05/17 1659    pantoprazole (PROTONIX) 20 MG tablet  Daily,   Status:  Discontinued     08/05/17 1701    ondansetron (ZOFRAN ODT) 8 MG disintegrating tablet  Every 8 hours PRN     08/05/17 1845    pantoprazole (PROTONIX) 20 MG tablet  Daily     08/05/17 1845    cyclobenzaprine (FLEXERIL) 10 MG tablet  Daily at bedtime     08/05/17 1845       Follow Up: Gastroenterology, [address] [place] [address] [phone]  Call  As needed  Cyndi Bender, PA-C [place] [address] [phone]  Schedule an appointment as soon as possible for a visit  As needed       Fatima Blank, MD 08/05/17 1846

## 2017-08-07 DIAGNOSIS — R911 Solitary pulmonary nodule: Secondary | ICD-10-CM | POA: Diagnosis not present

## 2017-08-07 DIAGNOSIS — R935 Abnormal findings on diagnostic imaging of other abdominal regions, including retroperitoneum: Secondary | ICD-10-CM | POA: Diagnosis not present

## 2017-08-07 DIAGNOSIS — R1013 Epigastric pain: Secondary | ICD-10-CM | POA: Diagnosis not present

## 2017-08-07 DIAGNOSIS — K219 Gastro-esophageal reflux disease without esophagitis: Secondary | ICD-10-CM | POA: Diagnosis not present

## 2017-08-08 ENCOUNTER — Encounter: Payer: Self-pay | Admitting: Pulmonary Disease

## 2017-08-08 ENCOUNTER — Ambulatory Visit: Payer: BLUE CROSS/BLUE SHIELD | Admitting: Pulmonary Disease

## 2017-08-08 DIAGNOSIS — G4733 Obstructive sleep apnea (adult) (pediatric): Secondary | ICD-10-CM | POA: Insufficient documentation

## 2017-08-08 DIAGNOSIS — Z9989 Dependence on other enabling machines and devices: Secondary | ICD-10-CM | POA: Diagnosis not present

## 2017-08-08 DIAGNOSIS — R911 Solitary pulmonary nodule: Secondary | ICD-10-CM | POA: Insufficient documentation

## 2017-08-08 NOTE — Assessment & Plan Note (Signed)
We will obtain download on her current machine from [place] and adjust settings as required Current issues seem to be related to leak and I have advised her about obtaining a new air fit F 30 fullface mask  Weight loss encouraged, compliance with goal of at least 4-6 hrs every night is the expectation. Advised against medications with sedative side effects Cautioned against driving when sleepy - understanding that sleepiness will vary on a day to day basis

## 2017-08-08 NOTE — Progress Notes (Signed)
Subjective:    Patient ID: Kathleen Walsh, female    DOB: October 13, 1980, 37 y.o.   MRN: [id number]  HPI  37 year old ex-smoker presents for evaluation of pulmonary nodule noted incidentally on CT imaging after an ER visit.  She would also like to establish care for OSA She has a history of lupus anticoagulant with 2 first trimester miscarriages and then had 2 healthy children.  She presented 4/6 with acute onset chest pain and shortness of breath.  Due to her prior surgical history she underwent CT angiogram.  This picked up a 7 x 5 mm nodule in the peripheral right upper lobe adjacent to the major fissure and abutting the pleura.  There was mosaic attenuation pattern with areas of hyperlucency throughout both lungs. 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  She reports substernal cramping and aching which occurs regardless of exertion, she denies preceding trauma or muscle pull.  She also reports a diagnosis of OSA but 8 years ago, CPAP is really helped her daytime somnolence and fatigue, she uses a full facemask.  Auto CPAP settings from 4-11, she had a new machine about 2 years ago, DME is [facility].  Since she got a new machine she feels that this is not working as well as her old one, would like me to review.  Weight has remained constant over the past 5 years She denies known snoring on the machine and remains compliant.   She quit smoking in January 2019, smoked 1/2 pack/day since age 59. 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  Family history of breast cancer in maternal grandmother, metastatic lung, bladder cancer in grandfather and leukemia and  another grandfather  I personally reviewed CT chest images  Past Medical History:  Diagnosis Date  . Abdominal wall pain   . Anemia   . Anxiety   . Arthritis    left ankle  . Blood dyscrasia    lupus anticoagulant during pregnancy  . Depression    takes cymbalta   . Headache(784.0)    migraines  . Sleep apnea    USES C-PAP    Past Surgical History:  Procedure Laterality Date  . ABDOMINAL HYSTERECTOMY    . CESAREAN SECTION WITH BILATERAL TUBAL LIGATION Bilateral 02/11/2013   Procedure: Repeat CESAREAN SECTION WITH BILATERAL TUBAL LIGATION;  Surgeon: Lovenia Kim, MD;  Location: [facility];  Service: Obstetrics;  Laterality: Bilateral;  EDD: 03/01/13  . DEBRIDEMENT OF ABDOMINAL WALL ABSCESS N/A 12/03/2014   Procedure: INCISION OF ABDOMINAL WALL LIPOMA;  Surgeon: Greer Pickerel, MD;  Location: [facility];  Service: General;  Laterality: N/A;  . DILATION AND CURETTAGE OF UTERUS  2004  . DILITATION & CURRETTAGE/HYSTROSCOPY WITH NOVASURE ABLATION N/A 11/19/2014   Procedure: DILATATION & CURETTAGE/HYSTEROSCOPY WITH NOVASURE ABLATION;  Surgeon: Brien Few, MD;  Location: [facility];  Service: Gynecology;  Laterality: N/A;  . ENDOMETRIAL ABLATION  10/20/14  . FOOT SURGERY  2008  . HERNIA REPAIR  09/19/2011  . LAPAROSCOPY N/A 12/03/2014   Procedure: LAPAROSCOPY DIAGNOSTIC WITH LYSIS OF ADHESIONS;  Surgeon: Greer Pickerel, MD;  Location: [facility];  Service: General;  Laterality: N/A;  . TUBAL LIGATION    .  VENTRAL HERNIA REPAIR  09/20/10   Laparoscopic, Dr Greer Pickerel  . WISDOM TOOTH EXTRACTION      Allergies  Allergen Reactions  . Amoxicillin Nausea And Vomiting    Social History   Socioeconomic History  . Marital status: Married    Spouse name: Not on file  . Number of children: Not on file  . Years of education: Not on file  . Highest education level: Not on file  Occupational History  . Not on file  Social Needs  . Financial resource strain: Not on file  . Food insecurity:    Worry:  Not on file    Inability: Not on file  . Transportation needs:    Medical: Not on file    Non-medical: Not on file  Tobacco Use  . Smoking status: Former Smoker    Packs/day: 0.30    Types: Cigarettes    Start date: 05/02/2017  . Smokeless tobacco: Never Used  Substance and Sexual Activity  . Alcohol use: No    Comment: "once every three years"   . Drug use: No  . Sexual activity: Yes    Birth control/protection: Surgical  Lifestyle  . Physical activity:    Days per week: Not on file    Minutes per session: Not on file  . Stress: Not on file  Relationships  . Social connections:    Talks on phone: Not on file    Gets together: Not on file    Attends religious service: Not on file    Active member of club or organization: Not on file    Attends meetings of clubs or organizations: Not on file    Relationship status: Not on file  . Intimate partner violence:    Fear of current or ex partner: Not on file    Emotionally abused: Not on file    Physically abused: Not on file    Forced sexual activity: Not on file  Other Topics Concern  . Not on file  Social History Narrative  . Not on file     Family History  Problem Relation Age of Onset  . Diabetes Mother   . Hypertension Mother   . Diabetes Father   . Hypertension Father     Social History   Socioeconomic History  . Marital status: Married    Spouse name: Not on file  . Number of children: Not on file  . Years of education: Not on file  . Highest education level: Not on file  Occupational History  . Not on file  Social Needs  . Financial resource strain: Not on file  . Food insecurity:    Worry: Not on file    Inability: Not on file  . Transportation needs:    Medical: Not on file    Non-medical: Not on file  Tobacco Use  . Smoking status: Former Smoker    Packs/day: 0.30    Types: Cigarettes    Start date: 05/02/2017  . Smokeless tobacco: Never Used  Substance and Sexual Activity  . Alcohol use: No      Comment: "once every three years"   . Drug use: No  . Sexual activity: Yes    Birth control/protection: Surgical  Lifestyle  . Physical activity:    Days per week: Not on file    Minutes per session: Not on file  . Stress: Not on file  Relationships  . Social connections:    Talks on phone: Not on file  Gets together: Not on file    Attends religious service: Not on file    Active member of club or organization: Not on file    Attends meetings of clubs or organizations: Not on file    Relationship status: Not on file  . Intimate partner violence:    Fear of current or ex partner: Not on file    Emotionally abused: Not on file    Physically abused: Not on file    Forced sexual activity: Not on file  Other Topics Concern  . Not on file  Social History Narrative  . Not on file      Review of Systems  Positive for shortness of breath with activity, chest pain, indigestion, acid heartburn, headaches, anxiety depression and feet swelling  Constitutional: negative for anorexia, fevers and sweats  Eyes: negative for irritation, redness and visual disturbance  Ears, nose, mouth, throat, and face: negative for earaches, epistaxis, nasal congestion and sore throat  Respiratory: negative for cough, dyspnea on exertion, sputum and wheezing  Cardiovascular: negative for orthopnea, palpitations and syncope  Gastrointestinal: negative for abdominal pain, constipation, diarrhea, melena, nausea and vomiting  Genitourinary:negative for dysuria, frequency and hematuria  Hematologic/lymphatic: negative for bleeding, easy bruising and lymphadenopathy  Musculoskeletal:negative for arthralgias, muscle weakness and stiff joints  Neurological: negative for coordination problems, gait problems, headaches and weakness  Endocrine: negative for diabetic symptoms including polydipsia, polyuria and weight loss     Objective:   Physical Exam   Gen. Pleasant, obese, in no distress, normal  affect ENT - no lesions, no post nasal drip, class 2-3 airway Neck: No JVD, no thyromegaly, no carotid bruits Lungs: no use of accessory muscles, no dullness to percussion, decreased without rales or rhonchi  Cardiovascular: Rhythm regular, heart sounds  normal, no murmurs or gallops, no peripheral edema, no reproducible chest wall tenderness Abdomen: soft and non-tender, no hepatosplenomegaly, BS normal. Musculoskeletal: No deformities, no cyanosis or clubbing Neuro:  alert, non focal, no tremors        Assessment & Plan:

## 2017-08-08 NOTE — Patient Instructions (Signed)
Follow-up CT chest without contrast in 6 months  We will obtain your sleep study and CPAP download from [facility] and send a prescription for a new air fit F 30 facemask

## 2017-08-08 NOTE — Assessment & Plan Note (Signed)
Likely benign but after exercise due diligence in this ex-smoker and will repeat CT chest without contrast in 6 months

## 2017-08-09 DIAGNOSIS — R1013 Epigastric pain: Secondary | ICD-10-CM | POA: Diagnosis not present

## 2017-08-09 DIAGNOSIS — R0789 Other chest pain: Secondary | ICD-10-CM | POA: Diagnosis not present

## 2017-08-09 DIAGNOSIS — K2930 Chronic superficial gastritis without bleeding: Secondary | ICD-10-CM | POA: Diagnosis not present

## 2017-08-10 ENCOUNTER — Institutional Professional Consult (permissible substitution): Payer: BLUE CROSS/BLUE SHIELD | Admitting: Internal Medicine

## 2017-08-16 DIAGNOSIS — K2930 Chronic superficial gastritis without bleeding: Secondary | ICD-10-CM | POA: Diagnosis not present

## 2017-08-21 ENCOUNTER — Telehealth: Payer: Self-pay | Admitting: Pulmonary Disease

## 2017-08-21 DIAGNOSIS — Z9989 Dependence on other enabling machines and devices: Principal | ICD-10-CM

## 2017-08-21 DIAGNOSIS — G4733 Obstructive sleep apnea (adult) (pediatric): Secondary | ICD-10-CM

## 2017-08-21 NOTE — Telephone Encounter (Signed)
Spoke with patient. She stated that she has not heard from [facility] regarding her new CPAP mask. Advised patient that Dr. Elsworth Soho wanted to see a copy of her cpap download and recent sleep study before sending in order. Patient stated that she needs this mask. Advised her that I would go ahead and place the order for the face mask.   Will request a copy of the sleep study and CPAP download again from [facility].   Will place reports in RA's look-at folders.

## 2017-08-25 ENCOUNTER — Other Ambulatory Visit: Payer: Self-pay | Admitting: Obstetrics and Gynecology

## 2017-08-25 ENCOUNTER — Telehealth: Payer: Self-pay | Admitting: Pulmonary Disease

## 2017-08-25 DIAGNOSIS — Z6841 Body Mass Index (BMI) 40.0 and over, adult: Secondary | ICD-10-CM | POA: Diagnosis not present

## 2017-08-25 DIAGNOSIS — R300 Dysuria: Secondary | ICD-10-CM | POA: Diagnosis not present

## 2017-08-25 DIAGNOSIS — F418 Other specified anxiety disorders: Secondary | ICD-10-CM | POA: Diagnosis not present

## 2017-08-25 DIAGNOSIS — L719 Rosacea, unspecified: Secondary | ICD-10-CM | POA: Diagnosis not present

## 2017-08-25 DIAGNOSIS — R1032 Left lower quadrant pain: Secondary | ICD-10-CM | POA: Diagnosis not present

## 2017-08-25 DIAGNOSIS — R350 Frequency of micturition: Secondary | ICD-10-CM | POA: Diagnosis not present

## 2017-08-25 NOTE — Telephone Encounter (Signed)
Order faxed to [facility].  Nothing further needed.

## 2017-08-25 NOTE — Telephone Encounter (Signed)
Order for cpap mask was sent to [facility] but it should have been sent to [facility].  Order was placed 08/21/17.    PCCs please advise.  Thanks!

## 2017-08-30 ENCOUNTER — Other Ambulatory Visit (HOSPITAL_COMMUNITY): Payer: Self-pay | Admitting: Gastroenterology

## 2017-08-30 ENCOUNTER — Telehealth: Payer: Self-pay | Admitting: Pulmonary Disease

## 2017-08-30 DIAGNOSIS — R1013 Epigastric pain: Secondary | ICD-10-CM

## 2017-08-30 NOTE — Telephone Encounter (Signed)
12/2010 NPSG >> AHI 99/h , corrected by CPAP 11 cm with med FF mask

## 2017-09-11 ENCOUNTER — Ambulatory Visit (HOSPITAL_COMMUNITY)
Admission: RE | Admit: 2017-09-11 | Discharge: 2017-09-11 | Disposition: A | Discharge status: Home / Self Care | Payer: BLUE CROSS/BLUE SHIELD | Source: Ambulatory Visit | Attending: Gastroenterology | Admitting: Gastroenterology

## 2017-09-11 DIAGNOSIS — R1013 Epigastric pain: Secondary | ICD-10-CM | POA: Diagnosis not present

## 2017-09-11 DIAGNOSIS — R109 Unspecified abdominal pain: Secondary | ICD-10-CM | POA: Diagnosis not present

## 2017-09-11 MED ORDER — TECHNETIUM TC 99M MEBROFENIN IV KIT
5.0000 | PACK | Freq: Once | INTRAVENOUS | Status: AC | PRN
  Administered 2017-09-11: 5 via INTRAVENOUS

## 2017-09-20 DIAGNOSIS — K219 Gastro-esophageal reflux disease without esophagitis: Secondary | ICD-10-CM | POA: Diagnosis not present

## 2017-09-20 DIAGNOSIS — K5900 Constipation, unspecified: Secondary | ICD-10-CM | POA: Diagnosis not present

## 2017-09-20 DIAGNOSIS — K2950 Unspecified chronic gastritis without bleeding: Secondary | ICD-10-CM | POA: Diagnosis not present

## 2017-10-02 DIAGNOSIS — N3001 Acute cystitis with hematuria: Secondary | ICD-10-CM | POA: Diagnosis not present

## 2017-10-02 DIAGNOSIS — Z6841 Body Mass Index (BMI) 40.0 and over, adult: Secondary | ICD-10-CM | POA: Diagnosis not present

## 2017-10-11 DIAGNOSIS — G4733 Obstructive sleep apnea (adult) (pediatric): Secondary | ICD-10-CM | POA: Diagnosis not present

## 2017-10-18 DIAGNOSIS — R140 Abdominal distension (gaseous): Secondary | ICD-10-CM | POA: Diagnosis not present

## 2017-10-18 DIAGNOSIS — R1013 Epigastric pain: Secondary | ICD-10-CM | POA: Diagnosis not present

## 2017-11-16 ENCOUNTER — Emergency Department (HOSPITAL_COMMUNITY): Payer: BLUE CROSS/BLUE SHIELD

## 2017-11-16 ENCOUNTER — Emergency Department (HOSPITAL_COMMUNITY)
Admission: EM | Admit: 2017-11-16 | Discharge: 2017-11-16 | Disposition: A | Discharge status: Home / Self Care | Payer: BLUE CROSS/BLUE SHIELD | Attending: Emergency Medicine | Admitting: Emergency Medicine

## 2017-11-16 ENCOUNTER — Encounter (HOSPITAL_COMMUNITY): Payer: Self-pay | Admitting: Emergency Medicine

## 2017-11-16 DIAGNOSIS — R0789 Other chest pain: Secondary | ICD-10-CM | POA: Diagnosis not present

## 2017-11-16 DIAGNOSIS — R079 Chest pain, unspecified: Secondary | ICD-10-CM | POA: Diagnosis not present

## 2017-11-16 DIAGNOSIS — Z79899 Other long term (current) drug therapy: Secondary | ICD-10-CM | POA: Insufficient documentation

## 2017-11-16 DIAGNOSIS — R0602 Shortness of breath: Secondary | ICD-10-CM | POA: Diagnosis not present

## 2017-11-16 DIAGNOSIS — Z87891 Personal history of nicotine dependence: Secondary | ICD-10-CM | POA: Diagnosis not present

## 2017-11-16 LAB — BASIC METABOLIC PANEL
ANION GAP: 8 (ref 5–15)
BUN: 17 mg/dL (ref 6–20)
CALCIUM: 8.8 mg/dL — AB (ref 8.9–10.3)
CO2: 24 mmol/L (ref 22–32)
Chloride: 106 mmol/L (ref 98–111)
Creatinine, Ser: 0.88 mg/dL (ref 0.44–1.00)
GFR calc Af Amer: >60 mL/min (ref >60)
GFR calc non Af Amer: >60 mL/min (ref >60)
GLUCOSE: 120 mg/dL — AB (ref 70–99)
Potassium: 3.7 mmol/L (ref 3.5–5.1)
Sodium: 138 mmol/L (ref 135–145)

## 2017-11-16 LAB — LIPASE, BLOOD: Lipase: 33 U/L (ref 11–51)

## 2017-11-16 LAB — CBC
HCT: 44.4 % (ref 36.0–46.0)
HEMOGLOBIN: 13.6 g/dL (ref 12.0–15.0)
MCH: 26.3 pg (ref 26.0–34.0)
MCHC: 30.6 g/dL (ref 30.0–36.0)
MCV: 85.7 fL (ref 78.0–100.0)
Platelets: 251 10*3/uL (ref 150–400)
RBC: 5.18 MIL/uL — ABNORMAL HIGH (ref 3.87–5.11)
RDW: 14 % (ref 11.5–15.5)
WBC: 12.3 10*3/uL — ABNORMAL HIGH (ref 4.0–10.5)

## 2017-11-16 LAB — HEPATIC FUNCTION PANEL
ALT: 16 U/L (ref 0–44)
AST: 14 U/L — ABNORMAL LOW (ref 15–41)
Albumin: 3.5 g/dL (ref 3.5–5.0)
Alkaline Phosphatase: 72 U/L (ref 38–126)
Bilirubin, Direct: <0.1 mg/dL (ref 0.0–0.2)
Total Bilirubin: 0.6 mg/dL (ref 0.3–1.2)
Total Protein: 6.5 g/dL (ref 6.5–8.1)

## 2017-11-16 LAB — I-STAT BETA HCG BLOOD, ED (MC, WL, AP ONLY): I-stat hCG, quantitative: <5 m[IU]/mL (ref <5)

## 2017-11-16 LAB — I-STAT TROPONIN, ED
Troponin i, poc: 0 ng/mL (ref 0.00–0.08)
Troponin i, poc: 0 ng/mL (ref 0.00–0.08)

## 2017-11-16 LAB — BRAIN NATRIURETIC PEPTIDE: B Natriuretic Peptide: 14.9 pg/mL (ref 0.0–100.0)

## 2017-11-16 LAB — D-DIMER, QUANTITATIVE (NOT AT ARMC)

## 2017-11-16 MED ORDER — FUROSEMIDE 10 MG/ML IJ SOLN
40.0000 mg | Freq: Once | INTRAMUSCULAR | Status: AC
  Administered 2017-11-16: 40 mg via INTRAVENOUS
  Filled 2017-11-16: qty 4

## 2017-11-16 MED ORDER — POTASSIUM CHLORIDE CRYS ER 20 MEQ PO TBCR: 20.0000 meq | EXTENDED_RELEASE_TABLET | Freq: Every day | ORAL | 0 refills | Status: DC

## 2017-11-16 MED ORDER — FUROSEMIDE 20 MG PO TABS: 60.0000 mg | ORAL_TABLET | Freq: Every day | ORAL | 0 refills | Status: DC

## 2017-11-16 MED ORDER — FENTANYL CITRATE (PF) 100 MCG/2ML IJ SOLN
50.0000 ug | Freq: Once | INTRAMUSCULAR | Status: AC
  Administered 2017-11-16: 50 ug via INTRAVENOUS
  Filled 2017-11-16: qty 2

## 2017-11-16 NOTE — ED Triage Notes (Signed)
Patient complains of aching left sided chest pain that radiates into shoulder that started last night. Also reports shortness of breath since January since she stopped smoking cigarettes. Denies other complaints. Alert, oriented, and in no apparent distress at this time.

## 2017-11-16 NOTE — ED Notes (Signed)
Difficult IV start x 2  

## 2017-11-16 NOTE — ED Notes (Signed)
Pt ambulated around nurses station with pulse oximetry. Saturations maintained 99-100% on room air. Hr 94-102 bpm she tolerated well.

## 2017-11-16 NOTE — ED Provider Notes (Signed)
[facility] Provider Note   [name]: [id number] Arrival date & time: 11/16/17  0909     History   Chief Complaint Chief Complaint  Patient presents with  . Chest Pain    HPI Kathleen Walsh is a 37 y.o. female with history of lupus, sleep apnea, anxiety, depression, migraines who presents with a 24-month history of shortness of breath since she stopped smoking.  She reports a 1 day history of left-sided chest pain with radiation to her back.  She describes it as an aching sensation.  It is pleuritic.  Patient's shortness of breath is unchanged with exertion, she states.  She also reports the area of her repaired ventral hernia has been hurting lately.  She denies any nausea, vomiting, urinary symptoms, fever, cough.  Patient has had some bilateral leg swelling and she takes Lasix.  She denies any recent long trips, surgeries, known cancer, history of blood clots, exogenous estrogen use.  Patient was seen in April 2019 and a lung nodule was found.  Patient saw pulmonologist who felt it was benign, but recommended repeat CTs.  HPI  Past Medical History:  Diagnosis Date  . Abdominal wall pain   . Anemia   . Anxiety   . Arthritis    left ankle  . Blood dyscrasia    lupus anticoagulant during pregnancy  . Depression    takes cymbalta   . Headache(784.0)    migraines  . Sleep apnea    USES C-PAP    Patient Active Problem List   Diagnosis Date Noted  . Pulmonary nodule 08/08/2017  . OSA on CPAP 08/08/2017  . Acute back pain with sciatica, left 08/14/2016  . Trochanteric bursitis, left hip 08/12/2016  . Impingement syndrome of left ankle 07/20/2016  . 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 Pain in left ankle and joints of left foot 07/13/2016  . Chronic left-sided low back pain with left-sided sciatica 07/13/2016  . Abnormal EKG 04/12/2016  . Cardiomegaly 08/15/2013  . SOB (shortness of breath) 08/15/2013  . Pleuritic chest pain 08/15/2013    . Postpartum care following cesarean delivery and tubal sterilization (10/13) 02/11/2013  . Abdominal muscle strain 12/20/2011  . Constipation 07/06/2011  . Abdominal pain 07/06/2011  . DIABETES MELLITUS, GESTATIONAL 04/17/2009  . DYSPNEA 04/17/2009    Past Surgical History:  Procedure Laterality Date  . ABDOMINAL HYSTERECTOMY    . CESAREAN SECTION WITH BILATERAL TUBAL LIGATION Bilateral 02/11/2013   Procedure: Repeat CESAREAN SECTION WITH BILATERAL TUBAL LIGATION;  Surgeon: Lovenia Kim, MD;  Location: [facility];  Service: Obstetrics;  Laterality: Bilateral;  EDD: 03/01/13  . DEBRIDEMENT OF ABDOMINAL WALL ABSCESS N/A 12/03/2014   Procedure: INCISION OF ABDOMINAL WALL LIPOMA;  Surgeon: Greer Pickerel, MD;  Location: [facility];  Service: General;  Laterality: N/A;  . DILATION AND CURETTAGE OF UTERUS  2004  . DILITATION & CURRETTAGE/HYSTROSCOPY WITH NOVASURE ABLATION N/A 11/19/2014   Procedure: DILATATION & CURETTAGE/HYSTEROSCOPY WITH NOVASURE ABLATION;  Surgeon: Brien Few, MD;  Location: [facility];  Service: Gynecology;  Laterality: N/A;  . ENDOMETRIAL ABLATION  10/20/14  . FOOT SURGERY  2008  . HERNIA REPAIR  09/19/2011  . LAPAROSCOPY N/A 12/03/2014   Procedure: LAPAROSCOPY DIAGNOSTIC WITH LYSIS OF ADHESIONS;  Surgeon: Greer Pickerel, MD;  Location: [facility];  Service: General;  Laterality: N/A;  . TUBAL LIGATION    . VENTRAL HERNIA REPAIR  09/20/10   Laparoscopic, Dr Greer Pickerel  . WISDOM TOOTH EXTRACTION  OB History    Gravida  4   Para  2   Term  2   Preterm      AB  2   Living  2     SAB      TAB      Ectopic      Multiple      Live Births  1            Home Medications    Prior to Admission medications   Medication Sig Start Date End Date Taking? Authorizing Provider  buPROPion (WELLBUTRIN XL) 150 MG 24 hr tablet Take 150 mg by mouth daily. 09/03/17  Yes [provider]  DULoxetine (CYMBALTA) 60 MG capsule Take 60 mg by mouth 2 (two) times daily.     Yes [provider]  furosemide (LASIX) 40 MG tablet Take 40 mg by mouth every morning.    Yes [provider]  pantoprazole (PROTONIX) 40 MG tablet Take 40 mg by mouth daily. 09/04/17  Yes [provider]  SUMAtriptan (IMITREX) 100 MG tablet Take 100 mg by mouth 2 (two) times daily as needed for migraine. 07/29/17  Yes [provider]  topiramate (TOPAMAX) 50 MG tablet Take 50 mg by mouth 2 (two) times daily.   Yes [provider]  varenicline (CHANTIX) 1 MG tablet Take 1 mg by mouth 2 (two) times daily.   Yes [provider]  amitriptyline (ELAVIL) 10 MG tablet TAKE 1 TABLET BY MOUTH EVERYDAY AT BEDTIME Patient not taking: Reported on 08/08/2017 11/24/16   Suzan Slick, NP  furosemide (LASIX) 20 MG tablet Take 3 tablets (60 mg total) by mouth daily for 3 days. 11/16/17 11/19/17  Frederica Kuster, PA-C  ibuprofen (ADVIL,MOTRIN) 400 MG tablet Take 1 tablet (400 mg total) by mouth every 8 (eight) hours as needed. Patient not taking: Reported on 11/16/2017 06/23/16   Jola Schmidt, MD  ondansetron [facility] ODT) 8 MG disintegrating tablet Take 0.5-1 tablets (4-8 mg total) by mouth every 8 (eight) hours as needed for nausea or vomiting. Patient not taking: Reported on 11/16/2017 08/05/17   Fatima Blank, MD  pantoprazole (PROTONIX) 20 MG tablet Take 1 tablet (20 mg total) by mouth daily. Patient not taking: Reported on 11/16/2017 08/05/17   Fatima Blank, MD  potassium chloride SA (K-DUR,KLOR-CON) 20 MEQ tablet Take 1 tablet (20 mEq total) by mouth daily. 11/16/17   Frederica Kuster, PA-C    Family History Family History  Problem Relation Age of Onset  . Diabetes Mother   . Hypertension Mother   . Diabetes Father   . Hypertension Father     Social History Social History   Tobacco Use  . Smoking status: Former Smoker    Packs/day: 0.30    Types: Cigarettes    Start date: 05/02/2017  . Smokeless tobacco: Never Used  Substance Use  Topics  . Alcohol use: No    Comment: "once every three years"   . Drug use: No     Allergies   Amoxicillin   Review of Systems Review of Systems  Constitutional: Negative for chills and fever.  HENT: Negative for facial swelling and sore throat.   Respiratory: Positive for shortness of breath.   Cardiovascular: Positive for chest pain.  Gastrointestinal: Positive for abdominal pain. Negative for nausea and vomiting.  Genitourinary: Negative for dysuria.  Musculoskeletal: Negative for back pain.  Skin: Negative for rash and wound.  Neurological: Negative for headaches.  Psychiatric/Behavioral:  The patient is not nervous/anxious.      Physical Exam Updated Vital Signs BP (!) 107/56   Pulse 85   Temp 98.4 F (36.9 C) (Oral)   Resp (!) 21   LMP 08/31/2015   SpO2 100%   Physical Exam  Constitutional: She appears well-developed and well-nourished. No distress.  HENT:  Head: Normocephalic and atraumatic.  Mouth/Throat: Oropharynx is clear and moist. No oropharyngeal exudate.  Eyes: Pupils are equal, round, and reactive to light. Conjunctivae are normal. Right eye exhibits no discharge. Left eye exhibits no discharge. No scleral icterus.  Neck: Normal range of motion. Neck supple. No thyromegaly present.  Cardiovascular: Normal rate, regular rhythm, normal heart sounds and intact distal pulses. Exam reveals no gallop and no friction rub.  No murmur heard. Pulmonary/Chest: Effort normal and breath sounds normal. No stridor. Tachypnea (32) noted. No respiratory distress. She has no wheezes. She has no rales.  Abdominal: Soft. Bowel sounds are normal. She exhibits no distension. There is tenderness in the periumbilical area. There is no rebound and no guarding.    Musculoskeletal: She exhibits no edema.  Lymphadenopathy:    She has no cervical adenopathy.  Neurological: She is alert. Coordination normal.  Skin: Skin is warm and dry. No rash noted. She is not diaphoretic. No  pallor.  Psychiatric: She has a normal mood and affect.  Nursing note and vitals reviewed.    ED Treatments / Results  Labs (all labs ordered are listed, but only abnormal results are displayed) Labs Reviewed  BASIC METABOLIC PANEL - Abnormal; Notable for the following components:      Result Value   Glucose, Bld 120 (*)    Calcium 8.8 (*)    All other components within normal limits  CBC - Abnormal; Notable for the following components:   WBC 12.3 (*)    RBC 5.18 (*)    All other components within normal limits  HEPATIC FUNCTION PANEL - Abnormal; Notable for the following components:   AST 14 (*)    All other components within normal limits  LIPASE, BLOOD  BRAIN NATRIURETIC PEPTIDE  D-DIMER, QUANTITATIVE (NOT AT [facility])  I-STAT TROPONIN, ED  I-STAT BETA HCG BLOOD, ED ([facility], [facility], [facility] ONLY)  I-STAT TROPONIN, ED    EKG EKG Interpretation  Date/Time:  Thursday November 16 2017 09:17:34 EDT Ventricular Rate:  85 PR Interval:  158 QRS Duration: 82 QT Interval:  400 QTC Calculation: 476 R Axis:   82 Text Interpretation:  Normal sinus rhythm Non-specific ST-t changes Confirmed by Lajean Saver [phone]) on 11/16/2017 11:36:27 AM   Radiology Dg Chest 2 View  Result Date: 11/16/2017 CLINICAL DATA:  Intermittent shortness of breath for 5-6 months. Increased chest pain and shortness of breath. Lightheaded. EXAM: CHEST - 2 VIEW COMPARISON:  Two-view chest x-ray 08/05/2017 FINDINGS: The heart size is normal. There is mild prominence of the interstitium. No significant airspace consolidation is present. No effusions are present. The visualized soft tissues and bony thorax are unremarkable. IMPRESSION: 1. Mild prominence of the interstitium without definite edema or pulmonary vascular congestion. This may reflect reactive airways disease versus mild edema and low lung volumes. 2. No focal airspace disease. Electronically Signed   By: San Morelle M.D.   On: 11/16/2017 10:17     Procedures Procedures (including critical care time)  Medications Ordered in ED Medications  fentaNYL (SUBLIMAZE) injection 50 mcg (50 mcg Intravenous Given 11/16/17 1359)  furosemide (LASIX) injection 40 mg (40 mg Intravenous Given 11/16/17 1814)  fentaNYL (SUBLIMAZE) injection 50 mcg (50 mcg Intravenous Given 11/16/17 1814)     Initial Impression / Assessment and Plan / ED Course  I have reviewed the triage vital signs and the nursing notes.  Pertinent labs & imaging results that were available during my care of the patient were reviewed by me and considered in my medical decision making (see chart for details).     Patient presenting with chronic shortness of breath and acute on chronic chest pain.  Patient has had chest pain like this in the past.  Labs are unremarkable including delta troponin, d-dimer, except for mild leukocytosis at 12.3.  Chest x-ray shows mild prominence of the interstitium without definite edema or pulmonary vascular congestion which may reflect reactive airway disease versus mild edema and low lung volumes, no focal airspace disease.  EKG shows NSR and nonspecific ST-T changes.  Patient with oxygen saturations above 95% throughout her stay in the emergency department.  She ambulated with saturations maintained at 99 to 100% on room air and she tolerated it well.  Considering the chronicity of the symptoms and heart score 3, feel patient is safe to be discharged home with close follow-up to PCP.  Will increase Lasix to 60 mg for 3 days (and potassium) considering edema is a possibility, as patient has had some increased leg swelling lately.  Return precautions discussed.  Patient understands and agrees with plan.  Patient vitals stable throughout ED course and discharged in satisfactory condition. I discussed patient case with Dr. Lita Mains who guided the patient's management and agrees with plan.   Final Clinical Impressions(s) / ED Diagnoses   Final diagnoses:   Nonspecific chest pain  Shortness of breath    ED Discharge Orders        Ordered    furosemide (LASIX) 20 MG tablet  Daily     11/16/17 1808    potassium chloride SA (K-DUR,KLOR-CON) 20 MEQ tablet  Daily     11/16/17 1808       Frederica Kuster, PA-C 11/16/17 2122    Julianne Rice, MD 11/16/17 2138

## 2017-11-16 NOTE — Discharge Instructions (Signed)
Increase your Lasix to 60 mg for 3 days.  Take potassium for the next 3 days to prevent loss of potassium with this increase.  Your chest x-ray showed possible mild fluid on your lungs which the Lasix should improve.  Otherwise, your work-up was reassuring today.  Please call your doctor and/or pulmonology for recheck tomorrow.  Please return the emergency department immediately if you develop any new or worsening symptoms.

## 2017-11-16 NOTE — ED Notes (Signed)
IV team at bedside 

## 2017-11-17 DIAGNOSIS — R600 Localized edema: Secondary | ICD-10-CM | POA: Diagnosis not present

## 2017-11-17 DIAGNOSIS — Z6841 Body Mass Index (BMI) 40.0 and over, adult: Secondary | ICD-10-CM | POA: Diagnosis not present

## 2017-11-17 DIAGNOSIS — R079 Chest pain, unspecified: Secondary | ICD-10-CM | POA: Diagnosis not present

## 2017-11-17 DIAGNOSIS — R0602 Shortness of breath: Secondary | ICD-10-CM | POA: Diagnosis not present

## 2017-11-20 ENCOUNTER — Encounter: Payer: Self-pay | Admitting: Acute Care

## 2017-11-20 ENCOUNTER — Ambulatory Visit (INDEPENDENT_AMBULATORY_CARE_PROVIDER_SITE_OTHER): Payer: BLUE CROSS/BLUE SHIELD | Admitting: Acute Care

## 2017-11-20 VITALS — BP 158/98 | HR 98 | Ht 66.0 in | Wt 326.6 lb

## 2017-11-20 DIAGNOSIS — R911 Solitary pulmonary nodule: Secondary | ICD-10-CM | POA: Diagnosis not present

## 2017-11-20 DIAGNOSIS — G4733 Obstructive sleep apnea (adult) (pediatric): Secondary | ICD-10-CM

## 2017-11-20 DIAGNOSIS — R600 Localized edema: Secondary | ICD-10-CM | POA: Diagnosis not present

## 2017-11-20 DIAGNOSIS — Z9989 Dependence on other enabling machines and devices: Secondary | ICD-10-CM

## 2017-11-20 DIAGNOSIS — R0602 Shortness of breath: Secondary | ICD-10-CM | POA: Diagnosis not present

## 2017-11-20 NOTE — Progress Notes (Addendum)
History of Present Illness Kathleen Walsh is a 37 y.o. female former smoker ( Quit smoking 05/2017) with history of OSA on CPAP, lupus, sleep apnea, anxiety, depression, migraines who presents with a 20-monthhistory of shortness of breath since she stopped smoking. She is followed by Dr. AElsworth SohoHPI Seen in the ED 11/16/2017  with chronic shortness of breath and acute on chronic chest pain.  Patient has had chest pain like this in the past.  Labs are unremarkable including delta troponin, d-dimer, except for mild leukocytosis at 12.3.  Chest x-ray shows mild prominence of the interstitium without definite edema or pulmonary vascular congestion which may reflect reactive airway disease versus mild edema and low lung volumes, no focal airspace disease.  EKG shows NSR and nonspecific ST-T changes.  Patient with oxygen saturations above 95% throughout her stay in the emergency department.  She ambulated with saturations maintained at 99 to 100% on room air and she tolerated it well.  Considering the chronicity of the symptoms and heart score 3, it was felt  patient was  safe to be discharged home with close follow-up to PCP.  They did  increase Lasix to 60 mg for 3 days (and potassium) considering edema is a possibility, as patient has had some increased leg swelling lately.   11/21/2017  Acute OV for Dyspnea. Pt was seen in the ED 11/16/2017 for dyspnea and chest pain.  She was treated with lasix for 3 days, and she did not notice any improvement, despite less swelling in her legs.. She presents to the office today for follow up. She does not have a productive cough. She continues to have dyspnea. She states she does get some relief with head repositioning and yawning. She continues to have lower extremity edema L>R. D-dimer was negative in the ED 7/18, so CTA was not done. Previous CTA was done 07/2017 and there was incidental pulmonary nodule noted.( See below)  She has follow up CT scheduled for 01/2018. She is  very anxious and states that this is a change that she has been experiencing since January 2019. She states she knows she is a big girl, but she has been a big girl for a long time now and she has never been this short of breath before. She is compliant with her CPAP therapy. She denies any fever, orthopnea or hemoptysis.  Test Results:  CXR 11/16/2017 1. Mild prominence of the interstitium without definite edema or pulmonary vascular congestion. This may reflect reactive airways disease versus mild edema and low lung volumes. 2. No focal airspace disease.  07/2017 CTA Chest/Abdomen/Pelvis No evidence of thoracic aortic atherosclerosis, dissection or aneurysm. 6 mm pleural based nodule involving the peripheral RIGHT upper lobe adjacent to the major fissure. If the patient is at low risk, follow-up CT in 6-12 months is recommended with consideration of a follow-up CT 1 year thereafter. If the patient is at high risk, follow-up CT at 6-12 months then a 2nd follow-up CT 1 year thereafter is recommended.  3. Scattered areas of hyperlucency in both lungs indicating localized air trapping as is seen in patients with asthma and/or bronchitis. No acute cardiopulmonary disease otherwise. 4. Calcification involving the POSTERIOR longitudinal ligament at the T5 level causing borderline spinal stenosis.  07/2017 CTA Abdomen and Pelvis:  1. No evidence of abdominal aortic atherosclerosis, dissection or aneurysm. 2. No acute abnormalities involving the abdomen or pelvis. Large colonic stool burden. 3. Intestinal malrotation, with nearly the entire small bowel to the  RIGHT of midline and nearly the entire colon to the LEFT of midline. 4. No evidence of recurrent VENTRAL hernia after prior repair.   Echo 08/30/2013 Left ventricle: The cavity size was normal. Wall thickness was increased in a pattern of mild LVH. Systolic function was normal. The estimated ejection fraction was in the range of  60% to 65%.       Transthoracic echocardiography. M-mode, complete 2D, spectral Doppler, and color Doppler. Height: Height: 167.6cm. Height: 66in. Weight: Weight: 150kg. Weight: 330lb. Body mass index: BMI: 53.4kg/m^2. Body surface area:  BSA: 2.15m2. Blood pressure: 120/78.   CBC Latest Ref Rng & Units 11/16/2017 08/05/2017 04/03/2016  WBC 4.0 - 10.5 K/uL 12.3(H) 10.2 16.9(H)  Hemoglobin 12.0 - 15.0 g/dL 13.6 13.4 13.7  Hematocrit 36.0 - 46.0 % 44.4 42.0 43.6  Platelets 150 - 400 K/uL 251 248 216    BMP Latest Ref Rng & Units 11/16/2017 08/05/2017 04/03/2016  Glucose 70 - 99 mg/dL 120(H) 106(H) 121(H)  BUN 6 - 20 mg/dL '17 12 18  ' Creatinine 0.44 - 1.00 mg/dL 0.88 0.66 0.65  Sodium 135 - 145 mmol/L 138 139 139  Potassium 3.5 - 5.1 mmol/L 3.7 3.8 3.3(L)  Chloride 98 - 111 mmol/L 106 108 105  CO2 22 - 32 mmol/L 24 21(L) 24  Calcium 8.9 - 10.3 mg/dL 8.8(L) 8.5(L) 9.3    BNP    Component Value Date/Time   BNP 14.9 11/16/2017 0927    ProBNP    Component Value Date/Time   PROBNP 16.0 08/15/2013 1007    PFT No results found for: FEV1PRE, FEV1POST, FVCPRE, FVCPOST, TLC, DLCOUNC, PREFEV1FVCRT, PSTFEV1FVCRT  Dg Chest 2 View  Result Date: 11/16/2017 CLINICAL DATA:  Intermittent shortness of breath for 5-6 months. Increased chest pain and shortness of breath. Lightheaded. EXAM: CHEST - 2 VIEW COMPARISON:  Two-view chest x-ray 08/05/2017 FINDINGS: The heart size is normal. There is mild prominence of the interstitium. No significant airspace consolidation is present. No effusions are present. The visualized soft tissues and bony thorax are unremarkable. IMPRESSION: 1. Mild prominence of the interstitium without definite edema or pulmonary vascular congestion. This may reflect reactive airways disease versus mild edema and low lung volumes. 2. No focal airspace disease. Electronically Signed   By: CSan MorelleM.D.   On: 11/16/2017 10:17     Past medical hx Past  Medical History:  Diagnosis Date  . Abdominal wall pain   . Anemia   . Anxiety   . Arthritis    left ankle  . Blood dyscrasia    lupus anticoagulant during pregnancy  . Depression    takes cymbalta   . Headache(784.0)    migraines  . Sleep apnea    USES C-PAP     Social History   Tobacco Use  . Smoking status: Former Smoker    Packs/day: 0.75    Years: 23.00    Pack years: 17.25    Types: Cigarettes    Start date: 05/2017  . Smokeless tobacco: Never Used  Substance Use Topics  . Alcohol use: No    Comment: "once every three years"   . Drug use: No    Kathleen Walsh reports that she has quit smoking. Her smoking use included cigarettes. She started smoking about 6 months ago. She has a 17.25 pack-year smoking history. She has never used smokeless tobacco. She reports that she does not drink alcohol or use drugs.  Tobacco Cessation: Quit smoking 05/2017 with a 17.25 pack year smoking history  Past surgical hx, Family hx, Social hx all reviewed.  Current Outpatient Medications on File Prior to Visit  Medication Sig  . buPROPion (WELLBUTRIN XL) 150 MG 24 hr tablet Take 150 mg by mouth daily.  . DULoxetine (CYMBALTA) 60 MG capsule Take 60 mg by mouth 2 (two) times daily.  . furosemide (LASIX) 40 MG tablet Take 40 mg by mouth every morning.   . SUMAtriptan (IMITREX) 100 MG tablet Take 100 mg by mouth 2 (two) times daily as needed for migraine.  . topiramate (TOPAMAX) 50 MG tablet Take 50 mg by mouth 2 (two) times daily.  . varenicline (CHANTIX) 1 MG tablet Take 1 mg by mouth 2 (two) times daily.   No current facility-administered medications on file prior to visit.      Allergies  Allergen Reactions  . Amoxicillin Nausea And Vomiting    Has patient had a PCN reaction causing immediate rash, facial/tongue/throat swelling, SOB or lightheadedness with hypotension: No Has patient had a PCN reaction causing severe rash involving mucus membranes or skin necrosis: No Has patient  had a PCN reaction that required hospitalization: No Has patient had a PCN reaction occurring within the last 10 years: Yes If all of the above answers are "NO", then may proceed with Cephalosporin use.     Review Of Systems:  Constitutional:   No  weight loss, night sweats,  Fevers, chills, +fatigue, or  lassitude.  HEENT:   + headaches,  Difficulty swallowing,  Tooth/dental problems, or  Sore throat,                No sneezing, itching, ear ache, nasal congestion, post nasal drip,   CV:  + Intermittent chest pain,  No Orthopnea, PND, + swelling in lower extremities, No anasarca, dizziness, palpitations, syncope.   GI  No heartburn, indigestion, abdominal pain, nausea, vomiting, diarrhea, change in bowel habits, loss of appetite, bloody stools.   Resp: + shortness of breath with exertion less at rest.  No excess mucus, no productive cough,  No non-productive cough,  No coughing up of blood.  No change in color of mucus.  No wheezing.  No chest wall deformity  Skin: no rash or lesions.  GU: no dysuria, change in color of urine, no urgency or frequency.  No flank pain, no hematuria   MS:  No joint pain or swelling.  No decreased range of motion.  No back pain.  Psych:  No change in mood or affect. No depression or anxiety.  No memory loss.   Vital Signs BP (!) 158/98 (BP Location: Left Arm, Cuff Size: Large)   Pulse 98   Ht '5\' 6"'  (1.676 m)   Wt (!) 326 lb 9.6 oz (148.1 kg)   LMP 08/31/2015   SpO2 98%   BMI 52.71 kg/m    Physical Exam:  General- No distress,  A&Ox3, pleasant ENT: No sinus tenderness, TM clear, pale nasal mucosa, no oral exudate,no post nasal drip, no LAN Cardiac: S1, S2, regular rate and rhythm, no murmur Chest: No wheeze/ rales/ dullness; no accessory muscle use, no nasal flaring, no sternal retractions, diminished per bases Abd.: Soft Non-tender, ND, BS +, Obese Ext: No clubbing cyanosis, 1+ BLE edema L>R Neuro:  normal strength, MAE x 4, A&O x 3 Skin:  No rashes, warm and dry Psych: normal mood and behavior, anxious   Assessment/Plan  Pulmonary nodule Follow up CT scheduled 01/2018 , 6 months post original CT as patient has a smoking history.  DYSPNEA  Worsening dyspnea on exertion ED visit 11/16/2017 Plan: We will call [facility] to get the spirometry results [phone] done 7/19. We will order PFT's We will order a 2 D Echo We will schedule a 6 minute walk test. We will call you with the results of your tests. Continue lasix as you have been doing Follow up with Dr. Elsworth Soho  In  2 months  or before as needed.  Please contact office for sooner follow up if symptoms do not improve or worsen or seek emergency care  OSA on CPAP Compliant with therapy Plan: Continue on CPAP at bedtime. You appear to be benefiting from the treatment Goal is to wear for at least 6 hours each night for maximal clinical benefit. Continue to work on weight loss, as the link between excess weight  and sleep apnea is well established.  Do not drive if sleepy. Remember to clean mask, tubing, filter, and reservoir once weekly with soapy water.  Follow up with 2 months with Dr. Elsworth Soho or before as needed.   Addendum 11/30/2017 I have reviewed the patient's PFT's and echo.  PFT's are normal.>> We will do a therapeutic trial of Anoro to see if she gets any benefit despite normal PFT's 6 minute walk>> normal  Echo shows EF of 60-65%, mild LVH, doppler parameters consistent with both elevated ventricular end diastolic filling pressures and elevated left artrial filling pressures, mild atrial dilation. CXR is not consistent with cardiogenic pulmonary edema.  She did not have any improvement in her symptoms after increase of her lasix.  She continues to have L leg edema on Lasix 40 mg daily. I will refer to cards for evaluation of a possible cardiogenic cause to her dyspnea.>> ? CP stress test She may benefit from and ACEI of ARB for reduction of  her filling pressures.  She does have a history of Lupus anticoagulant, but not diagnosis >> I will check ESR and ANA.   SIX MIN WALK 11/21/2017  Medications Lasix, Cymbalta, Wellbutrin, Protonix,Topramax,Chantix= all taken at 0600 11/21/17  Supplimental Oxygen during Test? (L/min) No  Laps 8  Partial Lap (in Meters) 0  Baseline BP (sitting) 136/72  Baseline Heartrate 99  Baseline Dyspnea (Borg Scale) 7  Baseline Fatigue (Borg Scale) 4  Baseline SPO2 98  BP (sitting) 142/80  Heartrate 121  Dyspnea (Borg Scale) 9  Fatigue (Borg Scale) 4  SPO2 100  BP (sitting) 138/78  Heartrate 106  SPO2 100  Stopped or Paused before Six Minutes No  Distance Completed 384  Tech Comments: amb. steady pace, Patient was having some SHOB at the end of the 6 min walk     Magdalen Spatz, NP 11/21/2017  5:04 PM

## 2017-11-20 NOTE — Patient Instructions (Addendum)
Follow up CT chest 01/2018>> follow up pulmonary nodule We will call [facility] to get the spirometry results [phone] done 7/19. We will order PFT's We will order a 2 D Echo We will schedule a 6 minute walk test. We will call you with the results of your tests. Continue on CPAP at bedtime. You appear to be benefiting from the treatment Goal is to wear for at least 6 hours each night for maximal clinical benefit. Continue to work on weight loss, as the link between excess weight  and sleep apnea is well established.  Do not drive if sleepy. Remember to clean mask, tubing, filter, and reservoir once weekly with soapy water.  Follow up with Dr. Elsworth Soho  In  2 months  or before as needed.  Please contact office for sooner follow up if symptoms do not improve or worsen or seek emergency care

## 2017-11-21 ENCOUNTER — Encounter: Payer: Self-pay | Admitting: Acute Care

## 2017-11-21 ENCOUNTER — Telehealth (HOSPITAL_COMMUNITY): Payer: Self-pay | Admitting: Acute Care

## 2017-11-21 ENCOUNTER — Ambulatory Visit (INDEPENDENT_AMBULATORY_CARE_PROVIDER_SITE_OTHER): Payer: BLUE CROSS/BLUE SHIELD | Admitting: *Deleted

## 2017-11-21 DIAGNOSIS — R0602 Shortness of breath: Secondary | ICD-10-CM

## 2017-11-21 NOTE — Assessment & Plan Note (Signed)
Worsening dyspnea on exertion ED visit 11/16/2017 Plan: We will call [facility] to get the spirometry results [phone] done 7/19. We will order PFT's We will order a 2 D Echo We will schedule a 6 minute walk test. We will call you with the results of your tests. Continue lasix as you have been doing Follow up with Dr. Elsworth Soho  In  2 months  or before as needed.  Please contact office for sooner follow up if symptoms do not improve or worsen or seek emergency care

## 2017-11-21 NOTE — Assessment & Plan Note (Deleted)
Worsening dyspnea on exertion ED visit 11/16/2017 Plan: We will call [facility] to get the spirometry results [phone] done 7/19. We will order PFT's We will order a 2 D Echo We will schedule a 6 minute walk test. We will call you with the results of your tests. Continue lasix as you have been doing Follow up with Dr. Elsworth Soho  In  2 months  or before as needed.  Please contact office for sooner follow up if symptoms do not improve or worsen or seek emergency care

## 2017-11-21 NOTE — Telephone Encounter (Signed)
User: Cherie Dark A Date/time: 11/21/17 3:05 PM  Comment: Returned pt call and lmsg for her to CB to get sch for an echo.Vassie Moment  Context:  Outcome: Left Message  Phone number: [phone] Phone Type: Home Phone  Comm. type: Telephone Call type: Outgoing  Contact: [name] Relation to patient: Self    User: Cherie Dark A Date/time: 11/21/17 11:48 AM  Comment: Called pt and lmsg for her to CB to get sch for an echo.Vassie Moment  Context:  Outcome: Left Message  Phone number: [phone] Phone Type: Home Phone  Comm. type: Telephone Call type: Outgoing  Contact: [name] Relation to patient: Self

## 2017-11-21 NOTE — Assessment & Plan Note (Signed)
Compliant with therapy Plan: Continue on CPAP at bedtime. You appear to be benefiting from the treatment Goal is to wear for at least 6 hours each night for maximal clinical benefit. Continue to work on weight loss, as the link between excess weight  and sleep apnea is well established.  Do not drive if sleepy. Remember to clean mask, tubing, filter, and reservoir once weekly with soapy water.  Follow up with 2 months with Dr. Elsworth Soho or before as needed.

## 2017-11-21 NOTE — Assessment & Plan Note (Signed)
Follow up CT scheduled 01/2018 , 6 months post original CT as patient has a smoking history.

## 2017-11-21 NOTE — Progress Notes (Signed)
   SIX MIN WALK 11/21/2017  Medications Lasix, Cymbalta, Wellbutrin, Protonix,Topramax,Chantix= all taken at 0600 11/21/17  Supplimental Oxygen during Test? (L/min) No  Laps 8  Partial Lap (in Meters) 0  Baseline BP (sitting) 136/72  Baseline Heartrate 99  Baseline Dyspnea (Borg Scale) 7  Baseline Fatigue (Borg Scale) 4  Baseline SPO2 98  BP (sitting) 142/80  Heartrate 121  Dyspnea (Borg Scale) 9  Fatigue (Borg Scale) 4  SPO2 100  BP (sitting) 138/78  Heartrate 106  SPO2 100  Stopped or Paused before Six Minutes No  Distance Completed 384  Tech Comments: amb. steady pace, Patient was having some SHOB at the end of the 6 min walk

## 2017-11-22 ENCOUNTER — Ambulatory Visit (INDEPENDENT_AMBULATORY_CARE_PROVIDER_SITE_OTHER): Payer: BLUE CROSS/BLUE SHIELD | Admitting: Pulmonary Disease

## 2017-11-22 DIAGNOSIS — R0602 Shortness of breath: Secondary | ICD-10-CM

## 2017-11-22 LAB — PULMONARY FUNCTION TEST
DL/VA % pred: 110 %
DL/VA: 5.73 ml/min/mmHg/L
DLCO UNC: 29.12 ml/min/mmHg
DLCO cor % pred: 101 %
DLCO cor: 28.95 ml/min/mmHg
DLCO unc % pred: 101 %
FEF 25-75 Post: 3.28 L/sec
FEF 25-75 Pre: 2.72 L/sec
FEF2575-%Change-Post: 20 %
FEF2575-%Pred-Post: 96 %
FEF2575-%Pred-Pre: 79 %
FEV1-%Change-Post: 3 %
FEV1-%PRED-POST: 90 %
FEV1-%PRED-PRE: 87 %
FEV1-PRE: 2.96 L
FEV1-Post: 3.07 L
FEV1FVC-%Change-Post: 0 %
FEV1FVC-%Pred-Pre: 96 %
FEV6-%Change-Post: 2 %
FEV6-%PRED-POST: 93 %
FEV6-%Pred-Pre: 91 %
FEV6-POST: 3.8 L
FEV6-Pre: 3.7 L
FEV6FVC-%PRED-POST: 102 %
FEV6FVC-%Pred-Pre: 102 %
FVC-%CHANGE-POST: 2 %
FVC-%PRED-PRE: 89 %
FVC-%Pred-Post: 92 %
FVC-POST: 3.8 L
FVC-PRE: 3.7 L
POST FEV6/FVC RATIO: 100 %
PRE FEV1/FVC RATIO: 80 %
Post FEV1/FVC ratio: 81 %
Pre FEV6/FVC Ratio: 100 %
RV % PRED: 60 %
RV: 1.02 L
TLC % pred: 85 %
TLC: 4.72 L

## 2017-11-22 NOTE — Progress Notes (Signed)
PFT done today. 

## 2017-11-23 ENCOUNTER — Telehealth: Payer: Self-pay | Admitting: Pulmonary Disease

## 2017-11-23 NOTE — Telephone Encounter (Signed)
Called and spoke with Patient. Explained that Dr Elsworth Soho had not reviewed her results, but someone will call her, with results once available. Nothing further at this time.

## 2017-11-24 ENCOUNTER — Other Ambulatory Visit (INDEPENDENT_AMBULATORY_CARE_PROVIDER_SITE_OTHER): Payer: Self-pay | Admitting: Orthopedic Surgery

## 2017-11-24 ENCOUNTER — Other Ambulatory Visit: Payer: Self-pay

## 2017-11-24 ENCOUNTER — Ambulatory Visit (HOSPITAL_COMMUNITY): Payer: BLUE CROSS/BLUE SHIELD | Attending: Cardiovascular Disease

## 2017-11-24 DIAGNOSIS — Z87891 Personal history of nicotine dependence: Secondary | ICD-10-CM | POA: Insufficient documentation

## 2017-11-24 DIAGNOSIS — R0602 Shortness of breath: Secondary | ICD-10-CM | POA: Diagnosis not present

## 2017-11-24 DIAGNOSIS — R600 Localized edema: Secondary | ICD-10-CM | POA: Diagnosis not present

## 2017-11-24 DIAGNOSIS — Z6841 Body Mass Index (BMI) 40.0 and over, adult: Secondary | ICD-10-CM | POA: Insufficient documentation

## 2017-11-24 DIAGNOSIS — M329 Systemic lupus erythematosus, unspecified: Secondary | ICD-10-CM | POA: Diagnosis not present

## 2017-11-24 DIAGNOSIS — G4733 Obstructive sleep apnea (adult) (pediatric): Secondary | ICD-10-CM | POA: Diagnosis not present

## 2017-11-24 NOTE — Telephone Encounter (Signed)
Please advise 

## 2017-11-27 ENCOUNTER — Telehealth: Payer: Self-pay | Admitting: Pulmonary Disease

## 2017-11-27 NOTE — Telephone Encounter (Signed)
Attempted to call patient today regarding results; advised RA is out of the office until 12/01/17. I did not receive an answer at time of call. I have left a voicemail message for pt to return call. X1

## 2017-11-30 ENCOUNTER — Telehealth: Payer: Self-pay | Admitting: Acute Care

## 2017-11-30 ENCOUNTER — Other Ambulatory Visit (INDEPENDENT_AMBULATORY_CARE_PROVIDER_SITE_OTHER): Payer: BLUE CROSS/BLUE SHIELD

## 2017-11-30 DIAGNOSIS — R0602 Shortness of breath: Secondary | ICD-10-CM | POA: Diagnosis not present

## 2017-11-30 MED ORDER — UMECLIDINIUM-VILANTEROL 62.5-25 MCG/INH IN AEPB: 1.0000 | INHALATION_SPRAY | Freq: Every day | RESPIRATORY_TRACT | 0 refills | Status: DC

## 2017-11-30 NOTE — Telephone Encounter (Signed)
I have called the patient with the results of her echo and PFT's . I explained that the PFT's are normal, and that her echo shows Mild LVH, and that doppler parameters were consistent with elevated ventricular end -diastolic filling pressures and elevated left atrial filling pressures. I told her we would place a referral for cardiology to evaluate her for a possible cardiogenic cause to her dyspnea. Please place a referral to cardiology.>> r/o cardiogenic etiology  For  dyspnea  We will also do a therapeutic trial of Anoro to see if she gets any benefit with the inhaler.   Please place orders for ESR and ANA >> patient will come in today to have them drawn at [facility] lab. Please place Anoro Inhaler x 2 at front desk for therapeutic trial.  Thanks so much.

## 2017-11-30 NOTE — Telephone Encounter (Signed)
Referral for cards placed and anoro up front for pick up

## 2017-11-30 NOTE — Telephone Encounter (Signed)
Orders have been placed. Pt is aware. Nothing further was needed. 

## 2017-12-01 LAB — SEDIMENTATION RATE: Sed Rate: 102 mm/hr — ABNORMAL HIGH (ref 0–20)

## 2017-12-01 NOTE — Telephone Encounter (Signed)
Called and spoke with patient, she is requesting results from her PFT and ECHO. Dr. Elsworth Soho please advise, thank you.

## 2017-12-01 NOTE — Telephone Encounter (Signed)
Pt is returning call. Cb is [phone]

## 2017-12-01 NOTE — Telephone Encounter (Signed)
Attempted to call patient today regarding results. I did not receive an answer at time of call. I have left a voicemail message for pt to return call. X2  

## 2017-12-01 NOTE — Telephone Encounter (Signed)
These were ordered by Eric Form, not by me. I reviewed blood test. Echo shows normal pump function. PFTs appear normal

## 2017-12-01 NOTE — Telephone Encounter (Signed)
Please see telephone note from 11/30/2017. The echo results  were explained in detail to  the patient on 8/1. I spent 15 minutes on the phone with her. The sed rate and ANA were drawn yesterday afternoon at 5 pm.  Sed rate is elevated, but ANA is pending. I am off next week on vacation, so please have the ANA   called by the doc of the day Monday 8/5. She is positive for the lupus anticoagulant. This is part of a work up for dyspnea . Thanks so much

## 2017-12-01 NOTE — Telephone Encounter (Signed)
Called and spoke with patient regarding Echo and PFT results.  Informed the patient of results and recommendations today. Pt verbalized understanding and denied any questions or concerns at this time.  Nothing further needed.

## 2017-12-04 LAB — ANA: Anti Nuclear Antibody(ANA): NEGATIVE

## 2017-12-25 ENCOUNTER — Encounter: Payer: Self-pay | Admitting: Cardiology

## 2017-12-25 ENCOUNTER — Telehealth: Payer: Self-pay | Admitting: Acute Care

## 2017-12-25 ENCOUNTER — Encounter: Payer: Self-pay | Admitting: *Deleted

## 2017-12-25 ENCOUNTER — Ambulatory Visit: Payer: BLUE CROSS/BLUE SHIELD | Admitting: Cardiology

## 2017-12-25 VITALS — BP 132/78 | HR 85 | Ht 66.0 in | Wt 329.0 lb

## 2017-12-25 DIAGNOSIS — Z9989 Dependence on other enabling machines and devices: Secondary | ICD-10-CM

## 2017-12-25 DIAGNOSIS — R0602 Shortness of breath: Secondary | ICD-10-CM | POA: Diagnosis not present

## 2017-12-25 DIAGNOSIS — G4733 Obstructive sleep apnea (adult) (pediatric): Secondary | ICD-10-CM | POA: Diagnosis not present

## 2017-12-25 NOTE — Telephone Encounter (Signed)
Kathleen Walsh we had been calling the patient for weeks and she returned the call today. You noted that you were going to wait until her sed rate came back to discuss plan of care. The sed rate is back, do you want to add anything to the result note?  Notes recorded by Magdalen Spatz, NP on 12/01/2017 at 2:10 PM EDT Please let patient know this value is elevated. We will wait for the results of the sed rate and determine plan of care then. I will be out of town on vacation next week. Please have this result evaluated by the DOD and call the patient. Thanks so much.

## 2017-12-25 NOTE — Patient Instructions (Signed)
Medication Instructions: Your physician recommends that you continue on your current medications as directed.    If you need a refill on your cardiac medications before your next appointment, please call your pharmacy.   Labwork: None  Procedures/Testing: None  Follow-Up: Your physician wants you to follow-up in 2-3 months with Dr. Harrell Gave at [facility] office.   Special Instructions:    Thank you for choosing [facility] at [facility] ([place]!!

## 2017-12-25 NOTE — Progress Notes (Signed)
Cardiology Office Note:    Date:  12/25/2017   ID:  [name], DOB 02/14/1981, MRN [id number]  PCP:  Cyndi Bender, PA-C  Cardiologist:  Buford Dresser, MD PhD  Referring MD: Cyndi Bender, PA-C   Chief complaint: Profound shortness of breath  History of Present Illness:    KRINA MRAZ is a 37 y.o. female with a hx of former tobacco use (quit 05/2017), lupus anticoagulant (in pregnancy), obstructive sleep apnea on CPAP, morbid obesity (BMI 53) who is seen as a new consult at the request of Cyndi Bender for evaluation of shortness of breath.  Patient concerns: Breathing is very short, even with conversation. Under a lot of stress, grandmother lives with them and has dementia--has recently become violent. She does note that she was at the beach with her family for a week and had no issues with her breathing that entire week. Walked a lot at [employer] every day, breathing was good even with exertion.   First time she noticed her breathing being a major issue was in 07/2017, when she also went to the ER because of chest pain and shortness of breath. Notes from that visit reviewed. Unremarkable troponins, CTA only with small lung nodule (has scheduled f/u), had tenderness to palpation and therefore was attributed to muscle injury. However, her shortness of breath persisted, and she was again seen in the ER on 11/16/17 for this. Her shortness of breath was associated with a mild chest pain at that time. Workup was largely unremarkable.  She sometimes has to think about/make herself takes deep breaths. Heat and activity make it worse. She used to be able to be active with her kids, do housework, etc without issues. Now she feels short of breath with activity. 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 Can climb a flight of stairs, but feels short of breath at the top.  Legs are intermittently swollen, went from 20 mg to 40 mg of lasix  with some improvement. Left foot always worse than right. However, even with improvement in her leg edema, her shortness of breath has not changed.  Last night felt a flutter in her chest for the first time. Never noted heart racing. No appreciable chest pain since her last ER visit.  Inhaler didn't help, gave her migraines. No history of environmental/seasonal allergies. Husband still smokes, but only outside. Adjusting CPAP mask, had issues with the first but now not waking up rested with the second.  Pertinent PMH: questionable lupus anticoagulant during pregnancy. Had a major dental procedure in March 2019 with all her upper teeth removed and a plate put in. No fevers/chills with that, was on an antibiotic perioperatively.  She was seen by [facility] on 7.22.19. Results of that testing showed normal PFTs and normal 6 minute walk. Echo done on 11/24/17 was notable for EF 60-65%, mild LVH, diastolic dysfunction (not graded)  Past Medical History:  Diagnosis Date  . Abdominal wall pain   . Anemia   . Anxiety   . Arthritis    left ankle  . Blood dyscrasia    lupus anticoagulant during pregnancy  . Depression    takes cymbalta   . Headache(784.0)    migraines  . Sleep apnea    USES C-PAP    Past Surgical History:  Procedure Laterality Date  . ABDOMINAL HYSTERECTOMY    . CESAREAN SECTION WITH BILATERAL TUBAL LIGATION Bilateral 02/11/2013   Procedure: Repeat  CESAREAN SECTION WITH BILATERAL TUBAL LIGATION;  Surgeon: Lovenia Kim, MD;  Location: [facility];  Service: Obstetrics;  Laterality: Bilateral;  EDD: 03/01/13  . DEBRIDEMENT OF ABDOMINAL WALL ABSCESS N/A 12/03/2014   Procedure: INCISION OF ABDOMINAL WALL LIPOMA;  Surgeon: Greer Pickerel, MD;  Location: [facility];  Service: General;  Laterality: N/A;  . DILATION AND CURETTAGE OF UTERUS  2004  . DILITATION & CURRETTAGE/HYSTROSCOPY WITH NOVASURE ABLATION N/A 11/19/2014   Procedure: DILATATION & CURETTAGE/HYSTEROSCOPY WITH NOVASURE  ABLATION;  Surgeon: Brien Few, MD;  Location: [facility];  Service: Gynecology;  Laterality: N/A;  . ENDOMETRIAL ABLATION  10/20/14  . FOOT SURGERY  2008  . HERNIA REPAIR  09/19/2011  . LAPAROSCOPY N/A 12/03/2014   Procedure: LAPAROSCOPY DIAGNOSTIC WITH LYSIS OF ADHESIONS;  Surgeon: Greer Pickerel, MD;  Location: [facility];  Service: General;  Laterality: N/A;  . TUBAL LIGATION    . VENTRAL HERNIA REPAIR  09/20/10   Laparoscopic, Dr Greer Pickerel  . WISDOM TOOTH EXTRACTION      Current Medications: Current Outpatient Medications on File Prior to Visit  Medication Sig  . buPROPion (WELLBUTRIN XL) 150 MG 24 hr tablet Take 150 mg by mouth daily.  . DULoxetine (CYMBALTA) 60 MG capsule Take 60 mg by mouth 2 (two) times daily.  . furosemide (LASIX) 40 MG tablet Take 40 mg by mouth every morning.   . pantoprazole (PROTONIX) 40 MG tablet Take 40 mg by mouth daily. Take 1 tablet daily  . SUMAtriptan (IMITREX) 100 MG tablet Take 100 mg by mouth 2 (two) times daily as needed for migraine.  . topiramate (TOPAMAX) 50 MG tablet Take 50 mg by mouth daily.    No current facility-administered medications on file prior to visit.      Allergies:   Amoxicillin   Social History   Socioeconomic History  . Marital status: Married    Spouse name: Not on file  . Number of children: Not on file  . Years of education: Not on file  . Highest education level: Not on file  Occupational History  . Not on file  Social Needs  . Financial resource strain: Not on file  . Food insecurity:    Worry: Not on file    Inability: Not on file  . Transportation needs:    Medical: Not on file    Non-medical: Not on file  Tobacco Use  . Smoking status: Former Smoker    Packs/day: 0.75    Years: 23.00    Pack years: 17.25    Types: Cigarettes    Start date: 05/2017  . Smokeless tobacco: Never Used  Substance and Sexual Activity  . Alcohol use: No    Comment: "once every three years"   . Drug use: No  . Sexual  activity: Yes    Birth control/protection: Surgical  Lifestyle  . Physical activity:    Days per week: Not on file    Minutes per session: Not on file  . Stress: Not on file  Relationships  . Social connections:    Talks on phone: Not on file    Gets together: Not on file    Attends religious service: Not on file    Active member of club or organization: Not on file    Attends meetings of clubs or organizations: Not on file    Relationship status: Not on file  Other Topics Concern  . Not on file  Social History Narrative  . Not on file  Family History: The patient's family history includes Diabetes in her father and mother; Hypertension in her father and mother.  ROS:   Please see the history of present illness.  Additional pertinent ROS: Review of Systems  Constitutional: Negative for chills, diaphoresis, fever and weight loss.  HENT: Positive for hearing loss. Negative for ear pain.   Eyes: Negative for blurred vision and pain.  Respiratory: Positive for shortness of breath. Negative for cough, hemoptysis, sputum production and wheezing.   Cardiovascular: Positive for palpitations and leg swelling. Negative for chest pain, orthopnea, claudication and PND.  Gastrointestinal: Positive for abdominal pain. Negative for blood in stool and melena.  Genitourinary: Negative for dysuria and hematuria.  Musculoskeletal: Negative for falls and joint pain.  Skin: Negative for itching and rash.  Neurological: Positive for headaches. Negative for focal weakness and loss of consciousness.  Endo/Heme/Allergies: Negative for environmental allergies. Does not bruise/bleed easily.    EKGs/Labs/Other Studies Reviewed:    The following studies were reviewed today: Echo 7.26.19  - Left ventricle: Normal GLS -18.6. The cavity size was normal.   Wall thickness was increased in a pattern of mild LVH. Systolic   function was normal. The estimated ejection fraction was in the   range of  60% to 65%. Wall motion was normal; there were no   regional wall motion abnormalities. Doppler parameters are   consistent with both elevated ventricular end-diastolic filling   pressure and elevated left atrial filling pressure. - Left atrium: The atrium was mildly dilated. - Atrial septum: No defect or patent foramen ovale was identified.  EKG:  EKG is ordered today.  The ekg ordered today demonstrates normal sinus rhythm with nonspecific ST changes, similar to prior  Recent Labs: 11/16/2017: ALT 16; B Natriuretic Peptide 14.9; BUN 17; Creatinine, Ser 0.88; Hemoglobin 13.6; Platelets 251; Potassium 3.7; Sodium 138  Recent Lipid Panel No results found for: CHOL, TRIG, HDL, CHOLHDL, VLDL, LDLCALC, LDLDIRECT  Pertinent recent labs; ESR 102, negative ANA  Physical Exam:    VS:  BP 132/78   Pulse 85   Ht _0  (1.676 m)   Wt (!) 329 lb (149.2 kg)   LMP 08/31/2015   BMI 53.10 kg/m     Wt Readings from Last 3 Encounters:  12/25/17 (!) 329 lb (149.2 kg)  11/20/17 (!) 326 lb 9.6 oz (148.1 kg)  08/08/17 (!) 302 lb (137 kg)     GEN: Well nourished, well developed in no acute distress HEENT: Normal NECK: JVD 8 cm, no HJR; No carotid bruits LYMPHATICS: No lymphadenopathy CARDIAC: regular rhythm, normal S1 and S2, no murmurs, rubs, gallops. Radial and DP pulses 2+ bilaterally. RESPIRATORY:  Clear to auscultation without rales, wheezing or rhonchi  ABDOMEN: Soft, non-tender, non-distended MUSCULOSKELETAL:  Trace bilateral LE edema; No deformity  SKIN: Warm and dry NEUROLOGIC:  Alert and oriented x 3 PSYCHIATRIC:  Normal affect   ASSESSMENT:    1. SOB (shortness of breath)   2. OSA on CPAP    PLAN:    1. Shortness of breath: patient has progressive shortness of breath over the last several months. The pattern is puzzling. While it would be tempting to attribute it to stress/anxiety, given that it has worsened with home stress and improved with vacation, there are elements that do  not fit this picture. Her ESR is very elevated, though her ANA is normal. She did have tooth extraction in March, and her symptoms started in April. However, she does not have B symptoms or  other concerns for a subacute bacterial endocarditis or other process. Her pulmonary workup has been largely unremarkable. Of note, while she does have a significantly elevated BMI, she has been at or around this weight for some time without significant symptoms in the past. She likely has some chronic obesity hypoventilation syndrome, but given her normal PFTs and 6MWT, this does not explain her symptoms fully.  I want to re-review her echo images; namely, I would like to see if an RVSP can be estimated, or if there are any apparent RV abnormalities. She is the right demographic for a pulmonary hypertension type picture. Her JVP is only mildly up today, and her lungs are clear; with her recent echo, it is unlikely that this is due to a left sided heart issue.   Her exercise tolerance is minimal. I am not sure she could do enough of a CPX to be useful at this time, but I will keep it in consideration.  I am hesitant to jump to a right heart cath, but in terms of definitively looking at the cause for her shortness of breath, this would be the gold standard.  I would recommend the following: -she has follow up scheduled in October to re-fit her CPAP mask. I am interested to know if this improves her symptoms -with her elevated ESR, I would consider additional inflammatory workup. I would recheck an ESR and do a CRP. Could consider further autoimmune/inflammatory workup (of note, ANA was negative previously). Her lupus anticoagulant workup was positive in 2009 and then negative in 2010. -with her young age and no significant calcium seen on her chest CT, as well as multiple checks on her cardiac biomarkers during ER evaluations, I do not think this is likely to be a coronary artery disease issue. -if her palpitations  recur, will consider placing an event monitor at the next visit  2. OSA on CPAP: pending follow up to determine if refitting/replacing the mask will improve her sleep and symptoms.  Plan for follow up: 2-3 months  Medication Adjustments/Labs and Tests Ordered: Current medicines are reviewed at length with the patient today.  Concerns regarding medicines are outlined above.  Orders Placed This Encounter  Procedures  . EKG 12-Lead   No orders of the defined types were placed in this encounter.   Patient Instructions  Medication Instructions: Your physician recommends that you continue on your current medications as directed.    If you need a refill on your cardiac medications before your next appointment, please call your pharmacy.   Labwork: None  Procedures/Testing: None  Follow-Up: Your physician wants you to follow-up in 2-3 months with Dr. Harrell Gave at [facility] office.   Special Instructions:    Thank you for choosing [facility] at [facility]!!       Signed, Buford Dresser, MD PhD 12/25/2017 12:47 PM    [place]

## 2017-12-25 NOTE — Telephone Encounter (Signed)
Please call patient and let her know her ANA was negative, but her ESR was elevated when we checked it. We need to re-check her ESR  and also check a CRP as part of an Auto Immune work up. Please make sure we have a good phone number to call with results because we had called her multiple times to give  her these results and she did not return our calls.  She had an appointment with cards today to evaluate for a cardiogenic cause. They are planning on follow up in 2-3 weeks. She is scheduled to see Rexene Edison NP on 02/12/2018. Please place orders for repeat ESR and CRP ( Please make sure they have not already been drawn today at [facility]). Thanks so much

## 2017-12-25 NOTE — Telephone Encounter (Signed)
This patient has been scheduled 02/12/2018 as an OV. This is on a SDMV afternoon.I approved it. We did it at the end of the day. I wanted to make sure you knew.Can we do the last SDMV for 3:30 that day? Thanks so much.

## 2017-12-25 NOTE — Telephone Encounter (Signed)
Attempted to call patient today regarding SG recommendations. I did not receive an answer at time of call. I have left a voicemail message for pt to return call. X1

## 2017-12-26 ENCOUNTER — Telehealth: Payer: Self-pay | Admitting: Acute Care

## 2017-12-26 DIAGNOSIS — R0602 Shortness of breath: Secondary | ICD-10-CM

## 2017-12-26 NOTE — Telephone Encounter (Signed)
Called patient, made aware of results per NP Groce. Voiced understanding. Orders for ESR and CRP placed. Confirmed follow up appt in October with patient. Nothing further needed at this time.

## 2017-12-26 NOTE — Telephone Encounter (Signed)
See 8/27 phone note.

## 2017-12-27 NOTE — Telephone Encounter (Signed)
That is actually one of the days that you are switched to the am for [facility] so there shouldn't be a conflict.

## 2018-01-03 DIAGNOSIS — R600 Localized edema: Secondary | ICD-10-CM | POA: Diagnosis not present

## 2018-01-03 DIAGNOSIS — H9192 Unspecified hearing loss, left ear: Secondary | ICD-10-CM | POA: Diagnosis not present

## 2018-01-03 DIAGNOSIS — Z87891 Personal history of nicotine dependence: Secondary | ICD-10-CM | POA: Diagnosis not present

## 2018-01-03 DIAGNOSIS — R635 Abnormal weight gain: Secondary | ICD-10-CM | POA: Diagnosis not present

## 2018-01-03 DIAGNOSIS — Z1331 Encounter for screening for depression: Secondary | ICD-10-CM | POA: Diagnosis not present

## 2018-01-03 DIAGNOSIS — J342 Deviated nasal septum: Secondary | ICD-10-CM | POA: Diagnosis not present

## 2018-01-03 DIAGNOSIS — R700 Elevated erythrocyte sedimentation rate: Secondary | ICD-10-CM | POA: Diagnosis not present

## 2018-01-03 DIAGNOSIS — Z79899 Other long term (current) drug therapy: Secondary | ICD-10-CM | POA: Diagnosis not present

## 2018-01-03 DIAGNOSIS — R7303 Prediabetes: Secondary | ICD-10-CM | POA: Diagnosis not present

## 2018-01-03 DIAGNOSIS — R221 Localized swelling, mass and lump, neck: Secondary | ICD-10-CM | POA: Diagnosis not present

## 2018-01-03 DIAGNOSIS — Z6841 Body Mass Index (BMI) 40.0 and over, adult: Secondary | ICD-10-CM | POA: Diagnosis not present

## 2018-01-03 DIAGNOSIS — F418 Other specified anxiety disorders: Secondary | ICD-10-CM | POA: Diagnosis not present

## 2018-01-04 ENCOUNTER — Telehealth: Payer: Self-pay | Admitting: Acute Care

## 2018-01-04 NOTE — Telephone Encounter (Signed)
Called patient, patient is upstairs  On the 3rd floor with her husband at a appointment at the time and she will stop by office when they leave to discuss her cpap mask.

## 2018-01-04 NOTE — Telephone Encounter (Signed)
Patient stopped by office, patient reports that she is having a procedure done on her deviated septum and wanted advise on what type of mask she could use with her cpap just temporarily during her healing phase. She currently has a F30 full face mask that rests just below her nose but does not cover her nose. Patient was made aware MD Elsworth Soho will not be back until 09/16. Patient reports she would like to know sooner than the 16th. Patient reports procedure is next month but she does not yet know the date.   TP please advise, what type of mask would be beneficial to patient.

## 2018-01-05 NOTE — Telephone Encounter (Signed)
LMTCB

## 2018-01-05 NOTE — Telephone Encounter (Signed)
Per TP: would recommend checking with [name] NP since she saw her last.  Thank you.

## 2018-01-05 NOTE — Telephone Encounter (Signed)
Please let patient know she will NOT be able to wear her CPAP device until 6-8 weeks after her sinus surgery or until her ENT ( or whomever is doing the surgery) has cleared her to use it again. The positive pressure from CPAP does not promote healing of the nasal tissues after surgery  and can actually be harmful.Please make sure she has a follow up appointment once she is cleared to wear her CPAP again per her ENT. Thanks so much.

## 2018-01-05 NOTE — Telephone Encounter (Signed)
SG please advise. Thanks   

## 2018-01-05 NOTE — Telephone Encounter (Signed)
Patient returning call - she can be reached at [phone]

## 2018-01-05 NOTE — Telephone Encounter (Signed)
Spoke with pt. She is aware of [name]'s response. Nothing further was needed. 

## 2018-01-22 ENCOUNTER — Telehealth: Payer: Self-pay | Admitting: Acute Care

## 2018-01-22 NOTE — Telephone Encounter (Signed)
Spoke with patient. She was requesting SG's advice on her labs that were drawn from her PCP's office Cyndi Bender PA with [facility]). Advised her that we have not received a copy of these results yet and I would request these now. Patient verbalized understanding.   Labs have requested to the lab triage. Will hold in triage until the results have been received.

## 2018-01-24 NOTE — Telephone Encounter (Signed)
Fax has been received and is located in [place] look at [place].  Judson Roch - please advise. Thanks.

## 2018-01-24 NOTE — Telephone Encounter (Signed)
Pt is returning call. Cb is [phone].

## 2018-01-24 NOTE — Telephone Encounter (Signed)
Spoke with pt. She is aware of [name]'s response. Nothing further was needed at this time.

## 2018-01-24 NOTE — Telephone Encounter (Signed)
Please let her know that the labs show an elevated sed rate and c reactive protein. These are both inflammatory markers, but they are somewhat non-specific. We can discuss a further work up when she comes in 10/16.   Tell her her HGB A1C was 5.8 which is within the range for pre-diabetes. Weight loss and management of sugar intake is recommended. Her Thyroid Stimulating Hormone is within normal range.  Thanks so much

## 2018-01-24 NOTE — Telephone Encounter (Signed)
Attempted to call pt. I did not receive an answer. I have left a message for pt to return our call.  

## 2018-01-29 DIAGNOSIS — H938X9 Other specified disorders of ear, unspecified ear: Secondary | ICD-10-CM | POA: Diagnosis not present

## 2018-01-29 DIAGNOSIS — H9193 Unspecified hearing loss, bilateral: Secondary | ICD-10-CM | POA: Diagnosis not present

## 2018-01-29 DIAGNOSIS — H9190 Unspecified hearing loss, unspecified ear: Secondary | ICD-10-CM | POA: Diagnosis not present

## 2018-01-29 DIAGNOSIS — J342 Deviated nasal septum: Secondary | ICD-10-CM | POA: Diagnosis not present

## 2018-01-29 DIAGNOSIS — J343 Hypertrophy of nasal turbinates: Secondary | ICD-10-CM | POA: Diagnosis not present

## 2018-02-05 ENCOUNTER — Other Ambulatory Visit (INDEPENDENT_AMBULATORY_CARE_PROVIDER_SITE_OTHER): Payer: BLUE CROSS/BLUE SHIELD

## 2018-02-05 ENCOUNTER — Ambulatory Visit (INDEPENDENT_AMBULATORY_CARE_PROVIDER_SITE_OTHER): Payer: BLUE CROSS/BLUE SHIELD | Admitting: Acute Care

## 2018-02-05 ENCOUNTER — Encounter: Payer: Self-pay | Admitting: Acute Care

## 2018-02-05 VITALS — BP 126/72 | HR 92 | Ht 66.0 in | Wt 331.0 lb

## 2018-02-05 DIAGNOSIS — Z9989 Dependence on other enabling machines and devices: Secondary | ICD-10-CM

## 2018-02-05 DIAGNOSIS — R0602 Shortness of breath: Secondary | ICD-10-CM

## 2018-02-05 DIAGNOSIS — G4733 Obstructive sleep apnea (adult) (pediatric): Secondary | ICD-10-CM

## 2018-02-05 MED ORDER — ALBUTEROL SULFATE HFA 108 (90 BASE) MCG/ACT IN AERS: 2.0000 | INHALATION_SPRAY | Freq: Four times a day (QID) | RESPIRATORY_TRACT | 6 refills | Status: DC | PRN

## 2018-02-05 NOTE — Assessment & Plan Note (Signed)
Do not resume CPAP until your ENT clears you to wear it. After surgery Continue on CPAP at bedtime. You appear to be benefiting from the treatment Goal is to wear for at least 6 hours each night for maximal clinical benefit. Continue to work on weight loss, as the link between excess weight  and sleep apnea is well established.  Do not drive if sleepy. Remember to clean mask, tubing, filter, and reservoir once weekly with soapy water.  Follow up with [name] or Dr. Elsworth Soho  In 3-4  weeks or before as needed.

## 2018-02-05 NOTE — Progress Notes (Addendum)
History of Present Illness Kathleen Walsh is a 37 y.o. female with new onset dyspnea and OSA on CPAP . She is followed by Dr. Elsworth Walsh.  History of Present Illness Kathleen Walsh is a 37 y.o. female former smoker ( Quit smoking 05/2017) with history of OSA on CPAP, lupus, sleep apnea, anxiety, depression, migraines who presents with a 50-monthhistory of shortness of breath since she stopped smoking. She is followed by Dr. AElsworth Walsh 02/06/2018 Follow up appointment for dyspnea Pt. Presents for follow up. She states her dyspnea has improved a bit, but not much.She states it is worse with exertion and in the heat.She is compliant with her lasix 40 mg daily. She has noted her edema is better.She is having surgery for her deviated septum 02/14/2018. [facility] [place] use her CPAP x 2 weeks while her surgical site heals.She did see cards. Per their note there was no obvious cardiac cause to the patient's dyspnea.  They are considering further testing in the future after she has her nasal surgery. ( CPX) Need to determine the if RHC should be done to evaluate for PAH.She is scheduled for her CT chest 10/9 as follow up of pulmonary nodule. She denies fever, chest pain, orthopnea or hemoptysis.  Test Results: FENO 02/05/2018 >> 21 PPB EKG:  12/25/2017  The ekg ordered  demonstrates normal sinus rhythm with nonspecific ST changes, similar to prior  Recent Labs: 11/16/2017: ALT 16; B Natriuretic Peptide 14.9; BUN 17; Creatinine, Ser 0.88; Hemoglobin 13.6; Platelets 251; Potassium 3.7; Sodium 138   Auto immune Evaluation ESR 11/30/2017>> 102 ANA Titer 02/05/2018>> Negative dsDNA Ab>> Negative RF 02/05/2018>> Negative < 14 SSA, SSB 02/05/2018>> Negative < 1.0 CK 191/08/7827>>41 Cyclic Citrullin Peptide Ab 02/05/2018>> less than 16 ANCA 02/05/2018>> Pending CCP Ab 02/05/2018>> less than 16 ENA RNP Ab 02/05/2018>> 1.4 02/05/2018 ENA SM Ab Ser-aCnc 0.0 - 0.9 AI <0.2     Scleroderma SCL-70 0.0 - 0.9 AI <0.2   ENA SSA (RO) Ab 0.0 - 0.9 AI <0.2   ENA SSB (LA) Ab       Recent Lipid Panel  02/07/2018 HRCT No findings to suggest interstitial lung disease. Mild air trapping indicative of mild small airways disease. Multiple small pulmonary nodules measuring 5 mm or less, stable dating back to 2011, considered definitively benign requiring no future imaging follow-up. Hepatic steatosis.  CXR 11/16/2017 1. Mild prominence of the interstitium without definite edema or pulmonary vascular congestion. This may reflect reactive airways disease versus mild edema and low lung volumes. 2. No focal airspace disease.  07/2017 CTA Chest/Abdomen/Pelvis No evidence of thoracic aortic atherosclerosis, dissection or aneurysm. 6 mm pleural based nodule involving the peripheral RIGHT upper lobe adjacent to the major fissure. If the patient is at low risk, follow-up CT in 6-12 months is recommended with consideration of a follow-up CT 1 year thereafter. If the patient is at high risk, follow-up CT at 6-12 months then a 2nd follow-up CT 1 year thereafter is recommended.  3. Scattered areas of hyperlucency in both lungs indicating localized air trapping as is seen in patients with asthma and/or bronchitis. No acute cardiopulmonary disease otherwise. 4. Calcification involving the POSTERIOR longitudinal ligament at the T5 level causing borderline spinal stenosis.  07/2017 CTA Abdomen and Pelvis:  1. No evidence of abdominal aortic atherosclerosis, dissection or aneurysm. 2. No acute abnormalities involving the abdomen or pelvis. Large colonic stool burden. 3.  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 4. No evidence of recurrent VENTRAL hernia after prior repair.   Echo 08/30/2013 Left ventricle: The cavity size was normal. Wall thickness was increased in a pattern of  mild LVH. Systolic function was normal. The estimated ejection fraction was in the range of 60% to 65%.       Transthoracic echocardiography. M-mode, complete 2D, spectral Doppler, and color Doppler. Height: Height: 167.6cm. Height: 66in. Weight: Weight: 150kg. Weight: 330lb. Body mass index: BMI: 53.4kg/m^2. Body surface area:  BSA: 2.80m2. Blood pressure:  CBC Latest Ref Rng & Units 11/16/2017 08/05/2017 04/03/2016  WBC 4.0 - 10.5 K/uL 12.3(H) 10.2 16.9(H)  Hemoglobin 12.0 - 15.0 g/dL 13.6 13.4 13.7  Hematocrit 36.0 - 46.0 % 44.4 42.0 43.6  Platelets 150 - 400 K/uL 251 248 216    BMP Latest Ref Rng & Units 11/16/2017 08/05/2017 04/03/2016  Glucose 70 - 99 mg/dL 120(H) 106(H) 121(H)  BUN 6 - 20 mg/dL '17 12 18  '$ Creatinine 0.44 - 1.00 mg/dL 0.88 0.66 0.65  Sodium 135 - 145 mmol/L 138 139 139  Potassium 3.5 - 5.1 mmol/L 3.7 3.8 3.3(L)  Chloride 98 - 111 mmol/L 106 108 105  CO2 22 - 32 mmol/L 24 21(L) 24  Calcium 8.9 - 10.3 mg/dL 8.8(L) 8.5(L) 9.3    BNP    Component Value Date/Time   BNP 14.9 11/16/2017 0927    ProBNP    Component Value Date/Time   PROBNP 16.0 08/15/2013 1007    PFT    Component Value Date/Time   FEV1PRE 2.96 11/22/2017 1556   FEV1POST 3.07 11/22/2017 1556   FVCPRE 3.70 11/22/2017 1556   FVCPOST 3.80 11/22/2017 1556   TLC 4.72 11/22/2017 1556   DLCOUNC 29.12 11/22/2017 1556   PREFEV1FVCRT 80 11/22/2017 1556   PSTFEV1FVCRT 81 11/22/2017 1556    No results found.   Past medical hx Past Medical History:  Diagnosis Date  . Abdominal wall pain   . Anemia   . Anxiety   . Arthritis    left ankle  . Blood dyscrasia    lupus anticoagulant during pregnancy  . Depression    takes cymbalta   . Headache(784.0)    migraines  . Sleep apnea    USES C-PAP     Social History   Tobacco Use  . Smoking status: Former Smoker    Packs/day: 0.75    Years: 23.00    Pack years: 17.25    Types: Cigarettes    Start date: 05/2017  . Smokeless  tobacco: Never Used  Substance Use Topics  . Alcohol use: No    Comment: "once every three years"   . Drug use: No    [name] reports that she has quit smoking. Her smoking use included cigarettes. She started smoking about 9 months ago. She has a 17.25 pack-year smoking history. She has never used smokeless tobacco. She reports that she does not drink alcohol or use drugs.  Tobacco Cessation: Former smoker , quit 2019 17.25 pack year smoking history.  Past surgical hx, Family hx, Social hx all reviewed.  Current Outpatient Medications on File Prior to Visit  Medication Sig  . buPROPion (WELLBUTRIN XL) 300 MG 24 hr tablet Take 300 mg by mouth daily.  . DULoxetine (CYMBALTA) 60 MG capsule Take 60 mg by mouth 2 (two) times daily.  . furosemide (LASIX) 40 MG tablet Take 40  mg by mouth every morning.   . pantoprazole (PROTONIX) 40 MG tablet Take 40 mg by mouth daily. Take 1 tablet daily  . SUMAtriptan (IMITREX) 100 MG tablet Take 100 mg by mouth 2 (two) times daily as needed for migraine.  . topiramate (TOPAMAX) 100 MG tablet Take 150 mg by mouth 2 (two) times daily.   No current facility-administered medications on file prior to visit.      Allergies  Allergen Reactions  . Amoxicillin Nausea And Vomiting    Has patient had a PCN reaction causing immediate rash, facial/tongue/throat swelling, SOB or lightheadedness with hypotension: No Has patient had a PCN reaction causing severe rash involving mucus membranes or skin necrosis: No Has patient had a PCN reaction that required hospitalization: No Has patient had a PCN reaction occurring within the last 10 years: Yes If all of the above answers are "NO", then may proceed with Cephalosporin use.     Review Of Systems:  Constitutional:   No  weight loss, night sweats,  Fevers, chills, + fatigue, or  lassitude.  HEENT:   No headaches,  Difficulty swallowing,  Tooth/dental problems, or  Sore throat,                No sneezing,  itching, ear ache, nasal congestion, post nasal drip,   CV:  No chest pain,  Orthopnea, PND, trace swelling in lower extremities, anasarca, dizziness, palpitations, syncope.   GI  No heartburn, indigestion, abdominal pain, nausea, vomiting, diarrhea, change in bowel habits, loss of appetite, bloody stools.   Resp: + shortness of breath with exertion not  at rest.  No excess mucus, no productive cough,  No non-productive cough,  No coughing up of blood.  No change in color of mucus.  No wheezing.  No chest wall deformity  Skin: no rash or lesions.  GU: no dysuria, change in color of urine, no urgency or frequency.  No flank pain, no hematuria   MS:  No joint pain or swelling.  No decreased range of motion.  No back pain.  Psych:  No change in mood or affect. No depression or anxiety.  No memory loss.   Vital Signs BP 126/72 (BP Location: Left Arm, Patient Position: Sitting, Cuff Size: Large)   Pulse 92   Ht '5\' 6"'$  (1.676 m)   Wt (!) 331 lb (150.1 kg)   LMP 08/31/2015   SpO2 98%   BMI 53.42 kg/m    Physical Exam:  General- No distress,  A&Ox3, pleasant ENT: No sinus tenderness, TM clear, pale nasal mucosa, no oral exudate,no post nasal drip, no LAN Cardiac: S1, S2, regular rate and rhythm, no murmur Chest: No wheeze/ rales/ dullness; no accessory muscle use, no nasal flaring, no sternal retractions, diminished per bases Abd.: Soft Non-tender, ND, NT, BS + Ext: No clubbing cyanosis, trace edema BLE Neuro:  normal strength, MAE x 4, A&O x 3 Skin: No rashes, warm and dry Psych: normal mood and behavior   Assessment/Plan  OSA on CPAP Do not resume CPAP until your ENT clears you to wear it. After surgery Continue on CPAP at bedtime. You appear to be benefiting from the treatment Goal is to wear for at least 6 hours each night for maximal clinical benefit. Continue to work on weight loss, as the link between excess weight  and sleep apnea is well established.  Do not drive if  sleepy. Remember to clean mask, tubing, filter, and reservoir once weekly with soapy water.  Follow  up with Judson Roch or Dr. Elsworth Walsh  In 3-4  weeks or before as needed.   DYSPNEA Continued dyspnea with exertion FENO 21 PPB PFT's normal Cards referral found no  obvious cause. Plan: We will do an HRCT We will draw additional labs today( Auto immune) DS DNA RF SSA SSB CK Aldolase ANCA CCP Consider exercise stress test Follow up with cards>> ? RHC to eval for [facility] We will prescribe an albuterol Inhaler , 2 puffs as needed up to every 6 hours for SOB and wheezing Follow up after CT and labs result    Addendum 02/08/2018 HRCT negative for ILD.  Mild small airways disease ? Mild asthma vs PAH in patient with OSA  as cause of dyspnea Awaiting Auto Immune work up results  Magdalen Spatz, NP 02/06/2018  5:16 PM

## 2018-02-05 NOTE — Assessment & Plan Note (Addendum)
Continued dyspnea with exertion FENO 21 PPB PFT's normal Cards referral found no  obvious cause. HRCT negative for ILD Mild small airways disease Plan: We will do an HRCT We will draw additional labs today( Auto immune) DS DNA RF SSA SSB CK Aldolase ANCA CCP Consider exercise stress test Follow up with cards>> ? RHC to eval for PAH We will prescribe an albuterol Inhaler , 2 puffs as needed up to every 6 hours for SOB and wheezing Follow up after CT and labs result

## 2018-02-05 NOTE — Patient Instructions (Signed)
It is good to see you again today. We will change the CT scan you have scheduled for 10/9 to a HRCT. We will draw additional labs today We will order an albuterol inhaler  Follow up with cards as scheduled Follow up in 3-4 weeks after Ct and labs have resulted. Please contact office for sooner follow up if symptoms do not improve or worsen or seek emergency care

## 2018-02-06 ENCOUNTER — Encounter: Payer: Self-pay | Admitting: Acute Care

## 2018-02-06 LAB — ANA+ENA+DNA/DS+SCL 70+SJOSSA/B
ANA TITER 1: NEGATIVE
ENA RNP Ab: 1.4 AI — ABNORMAL HIGH (ref 0.0–0.9)
ENA SM Ab Ser-aCnc: <0.2 AI (ref 0.0–0.9)
ENA SSA (RO) Ab: <0.2 AI (ref 0.0–0.9)
ENA SSB (LA) Ab: <0.2 AI (ref 0.0–0.9)
Scleroderma SCL-70: <0.2 AI (ref 0.0–0.9)

## 2018-02-06 LAB — CK: CK TOTAL: 41 U/L (ref 7–177)

## 2018-02-07 ENCOUNTER — Other Ambulatory Visit: Payer: BLUE CROSS/BLUE SHIELD

## 2018-02-07 ENCOUNTER — Ambulatory Visit (INDEPENDENT_AMBULATORY_CARE_PROVIDER_SITE_OTHER)
Admission: RE | Admit: 2018-02-07 | Discharge: 2018-02-07 | Disposition: A | Discharge status: Home / Self Care | Payer: BLUE CROSS/BLUE SHIELD | Source: Ambulatory Visit | Attending: Acute Care | Admitting: Acute Care

## 2018-02-07 DIAGNOSIS — R0602 Shortness of breath: Secondary | ICD-10-CM | POA: Diagnosis not present

## 2018-02-07 LAB — ANCA SCREEN W REFLEX TITER: ANCA SCREEN: NEGATIVE

## 2018-02-07 LAB — CYCLIC CITRUL PEPTIDE ANTIBODY, IGG: Cyclic Citrullin Peptide Ab: <16 UNITS

## 2018-02-07 LAB — SJOGREN'S SYNDROME ANTIBODS(SSA + SSB)
SSA (RO) (ENA) ANTIBODY, IGG: NEGATIVE AI
SSB (La) (ENA) Antibody, IgG: <1 AI

## 2018-02-07 LAB — RHEUMATOID FACTOR

## 2018-02-07 LAB — ALDOLASE: Aldolase: 6.2 U/L (ref <=8.1)

## 2018-02-08 ENCOUNTER — Ambulatory Visit: Payer: BLUE CROSS/BLUE SHIELD | Admitting: Interventional Cardiology

## 2018-02-09 ENCOUNTER — Telehealth: Payer: Self-pay | Admitting: Acute Care

## 2018-02-09 DIAGNOSIS — R760 Raised antibody titer: Secondary | ICD-10-CM

## 2018-02-09 NOTE — Telephone Encounter (Signed)
Please let her know the labs for the auto immune work up are negative at this point.  We can review them with her at follow up. Thanks

## 2018-02-09 NOTE — Telephone Encounter (Signed)
Dr. Chase Caller, These are the results of the auto immune work up. Is there any significance to the 1.4 ENA RNP Ab, when everything else is negative? Thanks    Results for SHELISA, FERN (MRN [id number]) as of 02/09/2018 15:01  Ref. Range 11/24/2017 13:52 11/30/2017 16:58 12/25/2017 09:38 02/05/2018 17:21 02/07/2018 16:37  Anti Nuclear Antibody(ANA) Latest Ref Range: NEGATIVE   NEGATIVE     ANA Titer 1 Unknown    Negative   Cyclic Citrullin Peptide Ab Latest Units: UNITS    <16   dsDNA Ab Latest Ref Range: 0 - 9 IU/mL    <1   ENA RNP Ab Latest Ref Range: 0.0 - 0.9 AI    1.4 (H)   ENA SSA (RO) Ab Latest Ref Range: 0.0 - 0.9 AI    <0.2   ENA SSB (LA) Ab Latest Ref Range: 0.0 - 0.9 AI    <0.2   RA Latex Turbid. Latest Ref Range: <14 IU/mL    <14   Scleroderma SCL-70 Latest Ref Range: 0.0 - 0.9 AI    <0.2   ENA SM Ab Ser-aCnc Latest Ref Range: 0.0 - 0.9 AI    <0.2   SSA (Ro) (ENA) Antibody, IgG Latest Ref Range: <1.0 NEG AI    <1.0 NEG   SSB (La) (ENA) Antibody, IgG Latest Ref Range: <1.0 NEG AI    <1.0 NEG

## 2018-02-12 ENCOUNTER — Ambulatory Visit: Payer: BLUE CROSS/BLUE SHIELD | Admitting: Acute Care

## 2018-02-13 NOTE — Telephone Encounter (Signed)
Please let patient know we will refer her to rheumatology. Let her know I spoke with Dr. Chase Caller and he feels that although labs are essentially  Negative that one lab that was just slightly elevated can be a predictor of future risk. ( ENA RNP Ab of 1.4). We want to refer to rheumatology to make sure this has follow up. Thanks so much.

## 2018-02-13 NOTE — Telephone Encounter (Signed)
Advised pt of results. Pt understood and nothing further is needed.   

## 2018-02-13 NOTE — Telephone Encounter (Signed)
Being young and female even in other antibodies negative, positive antibodyu could predict future risk - I have seen discussion to that effect by rheum. So, best refer to rheumatology for opinion

## 2018-02-14 ENCOUNTER — Ambulatory Visit: Payer: BLUE CROSS/BLUE SHIELD | Admitting: Acute Care

## 2018-02-14 DIAGNOSIS — G4733 Obstructive sleep apnea (adult) (pediatric): Secondary | ICD-10-CM | POA: Diagnosis not present

## 2018-02-14 DIAGNOSIS — R0981 Nasal congestion: Secondary | ICD-10-CM | POA: Diagnosis not present

## 2018-02-14 DIAGNOSIS — Z79899 Other long term (current) drug therapy: Secondary | ICD-10-CM | POA: Diagnosis not present

## 2018-02-14 DIAGNOSIS — J342 Deviated nasal septum: Secondary | ICD-10-CM | POA: Diagnosis not present

## 2018-02-14 DIAGNOSIS — J3489 Other specified disorders of nose and nasal sinuses: Secondary | ICD-10-CM | POA: Diagnosis not present

## 2018-02-14 DIAGNOSIS — J343 Hypertrophy of nasal turbinates: Secondary | ICD-10-CM | POA: Diagnosis not present

## 2018-02-14 DIAGNOSIS — Z87891 Personal history of nicotine dependence: Secondary | ICD-10-CM | POA: Diagnosis not present

## 2018-02-14 DIAGNOSIS — F329 Major depressive disorder, single episode, unspecified: Secondary | ICD-10-CM | POA: Diagnosis not present

## 2018-02-16 ENCOUNTER — Telehealth: Payer: Self-pay | Admitting: Acute Care

## 2018-02-16 DIAGNOSIS — R911 Solitary pulmonary nodule: Secondary | ICD-10-CM

## 2018-02-16 NOTE — Telephone Encounter (Signed)
Called and spoke with patient, she was returning Kathleen Walsh's C call. Kathleen Walsh was calling her regarding SG recommendations Magdalen Spatz, NP     4:31 PM  Note    Please let patient know we will refer her to rheumatology. Let her know I spoke with Dr. Chase Caller and he feels that although labs are essentially  Negative that one lab that was just slightly elevated can be a predictor of future risk. ( ENA RNP Ab of 1.4). We want to refer to rheumatology to make sure this has follow up. Thanks so much.     Called and spoke with patient regarding results.  Informed the patient of results and recommendations today. Placed referral to rheumatology today. Pt verbalized understanding and denied any questions or concerns at this time.  Nothing further needed.

## 2018-02-16 NOTE — Telephone Encounter (Signed)
LMOM to call us back.

## 2018-02-20 NOTE — Telephone Encounter (Signed)
lmom to call back 

## 2018-02-21 NOTE — Telephone Encounter (Signed)
Called pt and advised message from the provider. Pt understood and verbalized understanding. Nothing further is needed.   Referral placed for rheumatology

## 2018-02-21 NOTE — Addendum Note (Signed)
Addended by: Jannette Spanner on: 02/21/2018 05:25 PM   Modules accepted: Orders

## 2018-03-12 ENCOUNTER — Ambulatory Visit: Payer: BLUE CROSS/BLUE SHIELD | Admitting: Cardiology

## 2018-04-03 DIAGNOSIS — G4733 Obstructive sleep apnea (adult) (pediatric): Secondary | ICD-10-CM | POA: Diagnosis not present

## 2018-05-07 ENCOUNTER — Encounter: Payer: Self-pay | Admitting: Emergency Medicine

## 2018-05-07 ENCOUNTER — Ambulatory Visit
Admission: EM | Admit: 2018-05-07 | Discharge: 2018-05-07 | Disposition: A | Discharge status: Home / Self Care | Payer: BLUE CROSS/BLUE SHIELD | Attending: Family Medicine | Admitting: Family Medicine

## 2018-05-07 DIAGNOSIS — S161XXA Strain of muscle, fascia and tendon at neck level, initial encounter: Secondary | ICD-10-CM | POA: Diagnosis not present

## 2018-05-07 DIAGNOSIS — H9202 Otalgia, left ear: Secondary | ICD-10-CM

## 2018-05-07 MED ORDER — CYCLOBENZAPRINE HCL 10 MG PO TABS: ORAL_TABLET | ORAL | 0 refills | Status: DC

## 2018-05-07 MED ORDER — CEFDINIR 300 MG PO CAPS: 300.0000 mg | ORAL_CAPSULE | Freq: Two times a day (BID) | ORAL | 0 refills | Status: DC

## 2018-05-07 NOTE — ED Provider Notes (Signed)
[facility]   [id number] 05/07/18 Arrival Time: 2683  ASSESSMENT & PLAN:  1. Otalgia of left ear   2. Neck strain, initial encounter     Meds ordered this encounter  Medications  . cefdinir (OMNICEF) 300 MG capsule    Sig: Take 1 capsule (300 mg total) by mouth 2 (two) times daily.    Dispense:  20 capsule    Refill:  0  . cyclobenzaprine (FLEXERIL) 10 MG tablet    Sig: Take 1 tablet by mouth before bed as needed for muscle spasm. Warning: May cause drowsiness.    Dispense:  10 tablet    Refill:  0   Given exam findings, esp of R TM, will empirically treat with Omnicef. If not improving she may benefit from ENT evaluation.  Likely muscle spasm of neck. Encouraged movement as she tolerates. May try a heating pad. Medication sedation precautions. OTC symptom care as needed.  May f/u with PCP or here if not seeing improvement over the next week.  Reviewed expectations re: course of current medical issues. Questions answered. Outlined signs and symptoms indicating need for more acute intervention. Patient verbalized understanding. After Visit Summary given.   SUBJECTIVE: History from: patient.  Kathleen Walsh is a 38 y.o. female who presents with complaint of bilateral (L>R) otalgia; without drainage; without bleeding. Described as "a full feeling in my ears". Onset gradual, over the past 3-4 weeks. Recent cold symptoms: none. Fever: no. No significant changes in hearing. Overall normal PO intake without n/v. Sick contacts: no.  Also reports gradual onset of R posterior neck discomfort/pain. Gradual onset a few weeks ago. Questions sleeping wrong on neck. Baseline mild discomfort with sharp pain upon certain movements. No trauma or injury. Does repeatedly pull up on overhead door at work. Questions relation. Does not carry a heavy bag on her R shoulder. No extremity sensation changes or weakness. No specific aggravating or alleviating factors reported. No headaches or  visual changes. No rashes.  OTC treatment: occasional analgesic without relief.  Social History   Tobacco Use  Smoking Status Former Smoker  . Packs/day: 0.75  . Years: 23.00  . Pack years: 17.25  . Types: Cigarettes  . Start date: 05/2017  Smokeless Tobacco Never Used    ROS: As per HPI. All other systems negative.    OBJECTIVE:  Vitals:   05/07/18 1427  BP: 139/83  Pulse: 92  Resp: 18  Temp: 97.9 F (36.6 C)  TempSrc: Oral  SpO2: 98%    General appearance: alert; no distress HEENT: [place]; AT Ear Canals: normal TM: bilateral: dull, bulging with moderate erythema of R TM Neck: no LAD; does report mild tenderness to palpation of R neck musculature extending over R trapezius; no midline tenderness CV: RRR Lungs: unlabored respirations, symmetrical air entry; cough: absent; no respiratory distress Skin: warm and dry Ext: no extremity edema Psychological: alert and cooperative; normal mood and affect  Allergies  Allergen Reactions  . Amoxicillin Nausea And Vomiting    Has patient had a PCN reaction causing immediate rash, facial/tongue/throat swelling, SOB or lightheadedness with hypotension: No Has patient had a PCN reaction causing severe rash involving mucus membranes or skin necrosis: No Has patient had a PCN reaction that required hospitalization: No Has patient had a PCN reaction occurring within the last 10 years: Yes If all of the above answers are "NO", then may proceed with Cephalosporin use.     Past Medical History:  Diagnosis Date  . Abdominal wall  pain   . Anemia   . Anxiety   . Arthritis    left ankle  . Blood dyscrasia    lupus anticoagulant during pregnancy  . Depression    takes cymbalta   . Headache(784.0)    migraines  . Sleep apnea    USES C-PAP   Family History  Problem Relation Age of Onset  . Diabetes Mother   . Hypertension Mother   . Diabetes Father   . Hypertension Father    Social History   Socioeconomic History  .  Marital status: Married    Spouse name: Not on file  . Number of children: Not on file  . Years of education: Not on file  . Highest education level: Not on file  Occupational History  . Not on file  Social Needs  . Financial resource strain: Not on file  . Food insecurity:    Worry: Not on file    Inability: Not on file  . Transportation needs:    Medical: Not on file    Non-medical: Not on file  Tobacco Use  . Smoking status: Former Smoker    Packs/day: 0.75    Years: 23.00    Pack years: 17.25    Types: Cigarettes    Start date: 05/2017  . Smokeless tobacco: Never Used  Substance and Sexual Activity  . Alcohol use: No    Comment: "once every three years"   . Drug use: No  . Sexual activity: Yes    Birth control/protection: Surgical  Lifestyle  . Physical activity:    Days per week: Not on file    Minutes per session: Not on file  . Stress: Not on file  Relationships  . Social connections:    Talks on phone: Not on file    Gets together: Not on file    Attends religious service: Not on file    Active member of club or organization: Not on file    Attends meetings of clubs or organizations: Not on file    Relationship status: Not on file  . Intimate partner violence:    Fear of current or ex partner: Not on file    Emotionally abused: Not on file    Physically abused: Not on file    Forced sexual activity: Not on file  Other Topics Concern  . Not on file  Social History Narrative  . Not on file            Vanessa Kick, MD 05/07/18 [phone]

## 2018-05-07 NOTE — ED Triage Notes (Signed)
Pt presents to [facility] for assessment of left ear pain x 1 month, states it feels "like I have water in my ear".  Also states at the same time she developed right neck pain.

## 2018-05-07 NOTE — ED Notes (Signed)
Patient able to ambulate independently  

## 2018-05-18 ENCOUNTER — Emergency Department (HOSPITAL_COMMUNITY)
Admission: EM | Admit: 2018-05-18 | Discharge: 2018-05-18 | Disposition: A | Discharge status: Home / Self Care | Payer: BLUE CROSS/BLUE SHIELD | Attending: Emergency Medicine | Admitting: Emergency Medicine

## 2018-05-18 ENCOUNTER — Emergency Department (HOSPITAL_COMMUNITY): Payer: BLUE CROSS/BLUE SHIELD

## 2018-05-18 DIAGNOSIS — Z87891 Personal history of nicotine dependence: Secondary | ICD-10-CM | POA: Diagnosis not present

## 2018-05-18 DIAGNOSIS — R072 Precordial pain: Secondary | ICD-10-CM

## 2018-05-18 DIAGNOSIS — R0602 Shortness of breath: Secondary | ICD-10-CM | POA: Diagnosis not present

## 2018-05-18 DIAGNOSIS — R079 Chest pain, unspecified: Secondary | ICD-10-CM | POA: Diagnosis not present

## 2018-05-18 LAB — BASIC METABOLIC PANEL
ANION GAP: 11 (ref 5–15)
BUN: 9 mg/dL (ref 6–20)
CO2: 20 mmol/L — ABNORMAL LOW (ref 22–32)
Calcium: 8.9 mg/dL (ref 8.9–10.3)
Chloride: 108 mmol/L (ref 98–111)
Creatinine, Ser: 0.68 mg/dL (ref 0.44–1.00)
GFR calc Af Amer: >60 mL/min (ref >60)
GFR calc non Af Amer: >60 mL/min (ref >60)
GLUCOSE: 98 mg/dL (ref 70–99)
Potassium: 3.7 mmol/L (ref 3.5–5.1)
Sodium: 139 mmol/L (ref 135–145)

## 2018-05-18 LAB — I-STAT TROPONIN, ED: Troponin i, poc: 0 ng/mL (ref 0.00–0.08)

## 2018-05-18 LAB — CBC
HCT: 45.1 % (ref 36.0–46.0)
Hemoglobin: 14 g/dL (ref 12.0–15.0)
MCH: 26 pg (ref 26.0–34.0)
MCHC: 31 g/dL (ref 30.0–36.0)
MCV: 83.8 fL (ref 80.0–100.0)
Platelets: 243 10*3/uL (ref 150–400)
RBC: 5.38 MIL/uL — ABNORMAL HIGH (ref 3.87–5.11)
RDW: 14.4 % (ref 11.5–15.5)
WBC: 13 10*3/uL — ABNORMAL HIGH (ref 4.0–10.5)
nRBC: 0 % (ref 0.0–0.2)

## 2018-05-18 LAB — I-STAT BETA HCG BLOOD, ED (MC, WL, AP ONLY): I-stat hCG, quantitative: <5 m[IU]/mL (ref <5)

## 2018-05-18 LAB — D-DIMER, QUANTITATIVE: D-Dimer, Quant: <0.27 ug/mL-FEU (ref 0.00–0.50)

## 2018-05-18 MED ORDER — LIDOCAINE 5 % EX OINT: 1.0000 "application " | TOPICAL_OINTMENT | Freq: Two times a day (BID) | CUTANEOUS | 0 refills | Status: DC | PRN

## 2018-05-18 MED ORDER — KETOROLAC TROMETHAMINE 30 MG/ML IJ SOLN
30.0000 mg | Freq: Once | INTRAMUSCULAR | Status: AC
  Administered 2018-05-18: 30 mg via INTRAVENOUS
  Filled 2018-05-18: qty 1

## 2018-05-18 MED ORDER — SODIUM CHLORIDE 0.9% FLUSH: 3.0000 mL | Freq: Once | INTRAVENOUS | Status: DC

## 2018-05-18 NOTE — Discharge Instructions (Signed)

## 2018-05-18 NOTE — ED Provider Notes (Signed)
Emergency Department Provider Note   I have reviewed the triage vital signs and the nursing notes.   HISTORY  Chief Complaint Chest Pain   HPI Kathleen HOLSHOUSER is a 38 y.o. female with PMH of DM, anemia, and elevated BMI presents to the emergency department for evaluation of acute onset left-sided chest pain.  Patient describes a pain behind the left breast which woke her from sleep this morning.  Pain is worse with movement or deep breathing.  She does note some associated dyspnea.  She has had pain like this in the past and states that other providers have attributed this to her being overweight.  She denies any other specific modifying factors.  No fevers or chills.  The chest pain radiates out from behind the left breast and to the left shoulder.  No known injury.   The patient also notes a "crick" in the right, lateral neck.  Patient states that the neck is painful when she turns her head.  She denies injury.  She has seen her PCP who prescribed a muscle relaxer which has not helped significantly.  Denies any fevers or headaches.   Past Medical History:  Diagnosis Date  . Abdominal wall pain   . Anemia   . Anxiety   . Arthritis    left ankle  . Blood dyscrasia    lupus anticoagulant during pregnancy  . Depression    takes cymbalta   . Headache(784.0)    migraines  . Sleep apnea    USES C-PAP    Patient Active Problem List   Diagnosis Date Noted  . Pulmonary nodule 08/08/2017  . OSA on CPAP 08/08/2017  . Acute back pain with sciatica, left 08/14/2016  . Trochanteric bursitis, left hip 08/12/2016  . Impingement syndrome of left ankle 07/20/2016  . 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 Pain in left ankle and joints of left foot 07/13/2016  . Chronic left-sided low back pain with left-sided sciatica 07/13/2016  . Abnormal EKG 04/12/2016  . Cardiomegaly 08/15/2013  . SOB (shortness of breath) 08/15/2013  . Pleuritic chest pain 08/15/2013  . Postpartum care following  cesarean delivery and tubal sterilization (10/13) 02/11/2013  . Abdominal muscle strain 12/20/2011  . Constipation 07/06/2011  . Abdominal pain 07/06/2011  . DIABETES MELLITUS, GESTATIONAL 04/17/2009  . DYSPNEA 04/17/2009    Past Surgical History:  Procedure Laterality Date  . ABDOMINAL HYSTERECTOMY    . CESAREAN SECTION WITH BILATERAL TUBAL LIGATION Bilateral 02/11/2013   Procedure: Repeat CESAREAN SECTION WITH BILATERAL TUBAL LIGATION;  Surgeon: Lovenia Kim, MD;  Location: [facility];  Service: Obstetrics;  Laterality: Bilateral;  EDD: 03/01/13  . DEBRIDEMENT OF ABDOMINAL WALL ABSCESS N/A 12/03/2014   Procedure: INCISION OF ABDOMINAL WALL LIPOMA;  Surgeon: Greer Pickerel, MD;  Location: [facility];  Service: General;  Laterality: N/A;  . DILATION AND CURETTAGE OF UTERUS  2004  . DILITATION & CURRETTAGE/HYSTROSCOPY WITH NOVASURE ABLATION N/A 11/19/2014   Procedure: DILATATION & CURETTAGE/HYSTEROSCOPY WITH NOVASURE ABLATION;  Surgeon: Brien Few, MD;  Location: [facility];  Service: Gynecology;  Laterality: N/A;  . ENDOMETRIAL ABLATION  10/20/14  . FOOT SURGERY  2008  . HERNIA REPAIR  09/19/2011  . LAPAROSCOPY N/A 12/03/2014   Procedure: LAPAROSCOPY DIAGNOSTIC WITH LYSIS OF ADHESIONS;  Surgeon: Greer Pickerel, MD;  Location: [facility];  Service: General;  Laterality: N/A;  . TUBAL LIGATION    . VENTRAL HERNIA REPAIR  09/20/10   Laparoscopic, Dr Greer Pickerel  . WISDOM TOOTH EXTRACTION  Allergies Amoxicillin  Family History  Problem Relation Age of Onset  . Diabetes Mother   . Hypertension Mother   . Diabetes Father   . Hypertension Father     Social History Social History   Tobacco Use  . Smoking status: Former Smoker    Packs/day: 0.75    Years: 23.00    Pack years: 17.25    Types: Cigarettes    Start date: 05/2017  . Smokeless tobacco: Never Used  Substance Use Topics  . Alcohol use: No    Comment: "once every three years"   . Drug use: No    Review of  Systems  Constitutional: No fever/chills Eyes: No visual changes. ENT: No sore throat. Cardiovascular: Positive chest pain. Respiratory: Positive shortness of breath. Gastrointestinal: No abdominal pain.  No nausea, no vomiting.  No diarrhea.  No constipation. Genitourinary: Negative for dysuria. Musculoskeletal: Negative for back pain. Skin: Negative for rash. Neurological: Negative for headaches, focal weakness or numbness.  10-point ROS otherwise negative.  ____________________________________________   PHYSICAL EXAM:  VITAL SIGNS: ED Triage Vitals  Enc Vitals Group     BP 05/18/18 1220 116/72     Pulse Rate 05/18/18 1220 81     Resp 05/18/18 1220 16     Temp 05/18/18 1220 98.1 F (36.7 C)     Temp Source 05/18/18 1220 Oral     SpO2 05/18/18 1220 98 %     Pain Score 05/18/18 1221 8   Constitutional: Alert and oriented. Well appearing and in no acute distress. Eyes: Conjunctivae are normal.  Head: Atraumatic. Nose: No congestion/rhinnorhea. Mouth/Throat: Mucous membranes are moist.  Neck: No stridor.  Cardiovascular: Normal rate, regular rhythm. Good peripheral circulation. Grossly normal heart sounds.   Respiratory: Normal respiratory effort.  No retractions. Lungs CTAB. Gastrointestinal: Soft and nontender. No distention.  Musculoskeletal: No lower extremity tenderness nor edema. No gross deformities of extremities. Point tenderness to palpation over the left chest wall and sternum.  Neurologic:  Normal speech and language. No gross focal neurologic deficits are appreciated.  Skin:  Skin is warm, dry and intact. No rash noted.  ____________________________________________   LABS (all labs ordered are listed, but only abnormal results are displayed)  Labs Reviewed  BASIC METABOLIC PANEL - Abnormal; Notable for the following components:      Result Value   CO2 20 (*)    All other components within normal limits  CBC - Abnormal; Notable for the following  components:   WBC 13.0 (*)    RBC 5.38 (*)    All other components within normal limits  D-DIMER, QUANTITATIVE (NOT AT [facility])  I-STAT TROPONIN, ED  I-STAT BETA HCG BLOOD, ED ([facility], [facility], [facility] ONLY)   ____________________________________________  EKG   EKG Interpretation  Date/Time:  Friday May 18 2018 12:23:06 EST Ventricular Rate:  82 PR Interval:  152 QRS Duration: 84 QT Interval:  408 QTC Calculation: 476 R Axis:   73 Text Interpretation:  Normal sinus rhythm Nonspecific ST abnormality Abnormal ECG No STEMI.  Confirmed by Nanda Quinton [phone]) on 05/18/2018 3:20:42 PM       ____________________________________________  RADIOLOGY  Dg Chest 2 View  Result Date: 05/18/2018 CLINICAL DATA:  Chest pain and shortness of breath EXAM: CHEST - 2 VIEW COMPARISON:  Chest radiograph November 16, 2017 and chest CT February 07, 2018 FINDINGS: There is no appreciable edema or consolidation. Heart is upper normal in size with pulmonary vascularity normal. No adenopathy evident. No pneumothorax. No bone lesions.  IMPRESSION: No edema or consolidation.  Stable cardiac silhouette. Electronically Signed   By: Lowella Grip III M.D.   On: 05/18/2018 12:43    ____________________________________________   PROCEDURES  Procedure(s) performed:   Procedures  None ____________________________________________   INITIAL IMPRESSION / ASSESSMENT AND PLAN / ED COURSE  Pertinent labs & imaging results that were available during my care of the patient were reviewed by me and considered in my medical decision making (see chart for details).  Patient presents to the emergency department for evaluation of chest pain.  Pain appears to be musculoskeletal and is reproducible on exam.  She does describe some dyspnea and pleuritic quality to the pain.  Labs from triage including troponin and chest x-ray are without acute abnormality.  Her EKG shows no acute findings. I have added a d-dimer and will treat with  Toradol.   D-dimer negative. No enzyme trending with many hours of constant pain. Suspect MSK etiology with reproducible nature of pain.   At this time, I do not feel there is any life-threatening condition present. I have reviewed and discussed all results (EKG, imaging, lab, urine as appropriate), exam findings with patient. I have reviewed nursing notes and appropriate previous records.  I feel the patient is safe to be discharged home without further emergent workup. Discussed usual and customary return precautions. Patient and family (if present) verbalize understanding and are comfortable with this plan.  Patient will follow-up with their primary care provider. If they do not have a primary care provider, information for follow-up has been provided to them. All questions have been answered.  ____________________________________________  FINAL CLINICAL IMPRESSION(S) / ED DIAGNOSES  Final diagnoses:  Precordial chest pain     MEDICATIONS GIVEN DURING THIS VISIT:  Medications  ketorolac (TORADOL) 30 MG/ML injection 30 mg (30 mg Intravenous Given 05/18/18 1613)     NEW OUTPATIENT MEDICATIONS STARTED DURING THIS VISIT:  Discharge Medication List as of 05/18/2018  4:29 PM    START taking these medications   Details  lidocaine (XYLOCAINE) 5 % ointment Apply 1 application topically 2 (two) times daily as needed., Starting Fri 05/18/2018, Print        Note:  This document was prepared using Dragon voice recognition software and may include unintentional dictation errors.  Nanda Quinton, MD Emergency Medicine    , [facility], MD 05/19/18 [phone]

## 2018-08-09 DIAGNOSIS — F418 Other specified anxiety disorders: Secondary | ICD-10-CM | POA: Diagnosis not present

## 2018-08-09 DIAGNOSIS — R600 Localized edema: Secondary | ICD-10-CM | POA: Diagnosis not present

## 2018-09-20 DIAGNOSIS — R7303 Prediabetes: Secondary | ICD-10-CM | POA: Diagnosis not present

## 2018-09-20 DIAGNOSIS — F418 Other specified anxiety disorders: Secondary | ICD-10-CM | POA: Diagnosis not present

## 2018-09-20 DIAGNOSIS — R600 Localized edema: Secondary | ICD-10-CM | POA: Diagnosis not present

## 2018-09-20 DIAGNOSIS — R635 Abnormal weight gain: Secondary | ICD-10-CM | POA: Diagnosis not present

## 2018-10-29 DIAGNOSIS — R600 Localized edema: Secondary | ICD-10-CM | POA: Diagnosis not present

## 2018-10-29 DIAGNOSIS — I872 Venous insufficiency (chronic) (peripheral): Secondary | ICD-10-CM | POA: Diagnosis not present

## 2018-10-29 DIAGNOSIS — M7061 Trochanteric bursitis, right hip: Secondary | ICD-10-CM | POA: Diagnosis not present

## 2018-10-29 DIAGNOSIS — F418 Other specified anxiety disorders: Secondary | ICD-10-CM | POA: Diagnosis not present

## 2018-12-28 DIAGNOSIS — L989 Disorder of the skin and subcutaneous tissue, unspecified: Secondary | ICD-10-CM | POA: Diagnosis not present

## 2018-12-28 DIAGNOSIS — G43009 Migraine without aura, not intractable, without status migrainosus: Secondary | ICD-10-CM | POA: Diagnosis not present

## 2018-12-28 DIAGNOSIS — F418 Other specified anxiety disorders: Secondary | ICD-10-CM | POA: Diagnosis not present

## 2018-12-28 DIAGNOSIS — R600 Localized edema: Secondary | ICD-10-CM | POA: Diagnosis not present

## 2019-01-09 DIAGNOSIS — R0789 Other chest pain: Secondary | ICD-10-CM | POA: Diagnosis not present

## 2019-01-09 DIAGNOSIS — B079 Viral wart, unspecified: Secondary | ICD-10-CM | POA: Diagnosis not present

## 2019-01-09 DIAGNOSIS — Z79899 Other long term (current) drug therapy: Secondary | ICD-10-CM | POA: Diagnosis not present

## 2019-01-09 DIAGNOSIS — Z6841 Body Mass Index (BMI) 40.0 and over, adult: Secondary | ICD-10-CM | POA: Diagnosis not present

## 2019-06-01 DIAGNOSIS — H00025 Hordeolum internum left lower eyelid: Secondary | ICD-10-CM | POA: Diagnosis not present

## 2019-06-04 ENCOUNTER — Encounter (HOSPITAL_COMMUNITY): Payer: Self-pay | Admitting: Emergency Medicine

## 2019-06-04 ENCOUNTER — Emergency Department (HOSPITAL_COMMUNITY)
Admission: EM | Admit: 2019-06-04 | Discharge: 2019-06-05 | Disposition: A | Discharge status: Home / Self Care | Payer: BC Managed Care – PPO | Attending: Emergency Medicine | Admitting: Emergency Medicine

## 2019-06-04 ENCOUNTER — Other Ambulatory Visit: Payer: Self-pay

## 2019-06-04 DIAGNOSIS — R102 Pelvic and perineal pain: Secondary | ICD-10-CM | POA: Diagnosis not present

## 2019-06-04 DIAGNOSIS — Z87891 Personal history of nicotine dependence: Secondary | ICD-10-CM | POA: Insufficient documentation

## 2019-06-04 DIAGNOSIS — Z79899 Other long term (current) drug therapy: Secondary | ICD-10-CM | POA: Diagnosis not present

## 2019-06-04 LAB — COMPREHENSIVE METABOLIC PANEL
ALT: 18 U/L (ref 0–44)
AST: 13 U/L — ABNORMAL LOW (ref 15–41)
Albumin: 3.5 g/dL (ref 3.5–5.0)
Alkaline Phosphatase: 80 U/L (ref 38–126)
Anion gap: 10 (ref 5–15)
BUN: 15 mg/dL (ref 6–20)
CO2: 24 mmol/L (ref 22–32)
Calcium: 9.2 mg/dL (ref 8.9–10.3)
Chloride: 107 mmol/L (ref 98–111)
Creatinine, Ser: 0.88 mg/dL (ref 0.44–1.00)
GFR calc Af Amer: >60 mL/min (ref >60)
GFR calc non Af Amer: >60 mL/min (ref >60)
Glucose, Bld: 166 mg/dL — ABNORMAL HIGH (ref 70–99)
Potassium: 4.2 mmol/L (ref 3.5–5.1)
Sodium: 141 mmol/L (ref 135–145)
Total Bilirubin: 0.1 mg/dL — ABNORMAL LOW (ref 0.3–1.2)
Total Protein: 6.8 g/dL (ref 6.5–8.1)

## 2019-06-04 LAB — URINALYSIS, ROUTINE W REFLEX MICROSCOPIC
Bilirubin Urine: NEGATIVE
Glucose, UA: NEGATIVE mg/dL
Hgb urine dipstick: NEGATIVE
Ketones, ur: NEGATIVE mg/dL
Leukocytes,Ua: NEGATIVE
Nitrite: NEGATIVE
Protein, ur: NEGATIVE mg/dL
Specific Gravity, Urine: 1.023 (ref 1.005–1.030)
pH: 5 (ref 5.0–8.0)

## 2019-06-04 LAB — CBC
HCT: 46.9 % — ABNORMAL HIGH (ref 36.0–46.0)
Hemoglobin: 14.6 g/dL (ref 12.0–15.0)
MCH: 25.9 pg — ABNORMAL LOW (ref 26.0–34.0)
MCHC: 31.1 g/dL (ref 30.0–36.0)
MCV: 83.3 fL (ref 80.0–100.0)
Platelets: 254 10*3/uL (ref 150–400)
RBC: 5.63 MIL/uL — ABNORMAL HIGH (ref 3.87–5.11)
RDW: 14.7 % (ref 11.5–15.5)
WBC: 14.3 10*3/uL — ABNORMAL HIGH (ref 4.0–10.5)
nRBC: 0 % (ref 0.0–0.2)

## 2019-06-04 LAB — LIPASE, BLOOD: Lipase: 26 U/L (ref 11–51)

## 2019-06-04 LAB — I-STAT BETA HCG BLOOD, ED (MC, WL, AP ONLY): I-stat hCG, quantitative: <5 m[IU]/mL (ref <5)

## 2019-06-04 MED ORDER — SODIUM CHLORIDE 0.9% FLUSH: 3.0000 mL | Freq: Once | INTRAVENOUS | Status: DC

## 2019-06-04 NOTE — ED Triage Notes (Signed)
Pt presents to ED POV. Pt c/o "pain in ovaries." RLQ and LLQ pain. Pt reports partially hysterectomy and reports previous issues with abd pain but worsened today.

## 2019-06-05 ENCOUNTER — Emergency Department (HOSPITAL_COMMUNITY): Payer: BC Managed Care – PPO

## 2019-06-05 DIAGNOSIS — R1030 Lower abdominal pain, unspecified: Secondary | ICD-10-CM | POA: Diagnosis not present

## 2019-06-05 DIAGNOSIS — R109 Unspecified abdominal pain: Secondary | ICD-10-CM | POA: Diagnosis not present

## 2019-06-05 MED ORDER — HYDROMORPHONE HCL 1 MG/ML IJ SOLN
1.0000 mg | Freq: Once | INTRAMUSCULAR | Status: AC
  Administered 2019-06-05: 1 mg via INTRAVENOUS
  Filled 2019-06-05: qty 1

## 2019-06-05 MED ORDER — HYDROCODONE-ACETAMINOPHEN 5-325 MG PO TABS: 1.0000 | ORAL_TABLET | Freq: Four times a day (QID) | ORAL | 0 refills | Status: DC | PRN

## 2019-06-05 MED ORDER — IOHEXOL 300 MG/ML  SOLN
100.0000 mL | Freq: Once | INTRAMUSCULAR | Status: AC | PRN
  Administered 2019-06-05: 02:00:00 100 mL via INTRAVENOUS

## 2019-06-05 MED ORDER — FENTANYL CITRATE (PF) 100 MCG/2ML IJ SOLN
100.0000 ug | Freq: Once | INTRAMUSCULAR | Status: AC
  Administered 2019-06-05: 02:00:00 100 ug via INTRAVENOUS
  Filled 2019-06-05: qty 2

## 2019-06-05 MED ORDER — ONDANSETRON HCL 4 MG/2ML IJ SOLN
4.0000 mg | Freq: Once | INTRAMUSCULAR | Status: AC
  Administered 2019-06-05: 01:00:00 4 mg via INTRAVENOUS
  Filled 2019-06-05: qty 2

## 2019-06-05 NOTE — ED Notes (Signed)
Pt transported to CT ?

## 2019-06-05 NOTE — ED Provider Notes (Signed)
[facility] EMERGENCY DEPARTMENT Provider Note   [name]: [id number] Arrival date & time: 06/04/19  1905     History Chief Complaint  Patient presents with  . Abdominal Pain    Kathleen Walsh is a 39 y.o. female.  The history is provided by the patient.  Abdominal Pain Pain location:  RLQ and LLQ Pain quality: sharp   Pain radiates to:  Back Pain severity:  Severe Onset quality:  Sudden Duration:  6 hours Timing:  Constant Progression:  Worsening Chronicity:  New Relieved by:  Nothing Worsened by:  Movement and palpation Associated symptoms: no cough, no dysuria and no vaginal bleeding    Patient reports sudden onset of lower abdominal pain.  She reports it feels similar "ovary pain" no fevers or vomiting.  No cough or shortness of breath.  No vaginal bleeding or dysuria. Patient reports "partial hysterectomy "previously but still has her ovaries    Past Medical History:  Diagnosis Date  . Abdominal wall pain   . Anemia   . Anxiety   . Arthritis    left ankle  . Blood dyscrasia    lupus anticoagulant during pregnancy  . Depression    takes cymbalta   . Headache(784.0)    migraines  . Sleep apnea    USES C-PAP    Patient Active Problem List   Diagnosis Date Noted  . Pulmonary nodule 08/08/2017  . OSA on CPAP 08/08/2017  . Acute back pain with sciatica, left 08/14/2016  . Trochanteric bursitis, left hip 08/12/2016  . Impingement syndrome of left ankle 07/20/2016  . 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 Pain in left ankle and joints of left foot 07/13/2016  . Chronic left-sided low back pain with left-sided sciatica 07/13/2016  . Abnormal EKG 04/12/2016  . Cardiomegaly 08/15/2013  . SOB (shortness of breath) 08/15/2013  . Pleuritic chest pain 08/15/2013  . Postpartum care following cesarean delivery and tubal sterilization (10/13) 02/11/2013  . Abdominal muscle strain 12/20/2011  . Constipation 07/06/2011  . Abdominal pain 07/06/2011  .  DIABETES MELLITUS, GESTATIONAL 04/17/2009  . DYSPNEA 04/17/2009    Past Surgical History:  Procedure Laterality Date  . ABDOMINAL HYSTERECTOMY    . CESAREAN SECTION WITH BILATERAL TUBAL LIGATION Bilateral 02/11/2013   Procedure: Repeat CESAREAN SECTION WITH BILATERAL TUBAL LIGATION;  Surgeon: Lovenia Kim, MD;  Location: [facility];  Service: Obstetrics;  Laterality: Bilateral;  EDD: 03/01/13  . DEBRIDEMENT OF ABDOMINAL WALL ABSCESS N/A 12/03/2014   Procedure: INCISION OF ABDOMINAL WALL LIPOMA;  Surgeon: Greer Pickerel, MD;  Location: [facility];  Service: General;  Laterality: N/A;  . DILATION AND CURETTAGE OF UTERUS  2004  . DILITATION & CURRETTAGE/HYSTROSCOPY WITH NOVASURE ABLATION N/A 11/19/2014   Procedure: DILATATION & CURETTAGE/HYSTEROSCOPY WITH NOVASURE ABLATION;  Surgeon: Brien Few, MD;  Location: [facility];  Service: Gynecology;  Laterality: N/A;  . ENDOMETRIAL ABLATION  10/20/14  . FOOT SURGERY  2008  . HERNIA REPAIR  09/19/2011  . LAPAROSCOPY N/A 12/03/2014   Procedure: LAPAROSCOPY DIAGNOSTIC WITH LYSIS OF ADHESIONS;  Surgeon: Greer Pickerel, MD;  Location: [facility];  Service: General;  Laterality: N/A;  . TUBAL LIGATION    . VENTRAL HERNIA REPAIR  09/20/10   Laparoscopic, Dr Greer Pickerel  . WISDOM TOOTH EXTRACTION       OB History    Gravida  4   Para  2   Term  2   Preterm      AB  2   Living  2     SAB      TAB      Ectopic      Multiple      Live Births  1           Family History  Problem Relation Age of Onset  . Diabetes Mother   . Hypertension Mother   . Diabetes Father   . Hypertension Father     Social History   Tobacco Use  . Smoking status: Former Smoker    Packs/day: 0.75    Years: 23.00    Pack years: 17.25    Types: Cigarettes    Start date: 05/2017  . Smokeless tobacco: Never Used  Substance Use Topics  . Alcohol use: No    Comment: "once every three years"   . Drug use: No    Home Medications Prior to Admission medications    Medication Sig Start Date End Date Taking? Authorizing Provider  albuterol (PROVENTIL HFA;VENTOLIN HFA) 108 (90 Base) MCG/ACT inhaler Inhale 2 puffs into the lungs every 6 (six) hours as needed for wheezing or shortness of breath. 02/05/18   Magdalen Spatz, NP  buPROPion (WELLBUTRIN XL) 300 MG 24 hr tablet Take 300 mg by mouth daily. 01/03/18   [provider]  cefdinir (OMNICEF) 300 MG capsule Take 1 capsule (300 mg total) by mouth 2 (two) times daily. 05/07/18   Vanessa Kick, MD  cyclobenzaprine (FLEXERIL) 10 MG tablet Take 1 tablet by mouth before bed as needed for muscle spasm. Warning: May cause drowsiness. 05/07/18   Vanessa Kick, MD  DULoxetine (CYMBALTA) 60 MG capsule Take 60 mg by mouth 2 (two) times daily.    [provider]  furosemide (LASIX) 40 MG tablet Take 40 mg by mouth every morning.     [provider]  lidocaine (XYLOCAINE) 5 % ointment Apply 1 application topically 2 (two) times daily as needed. 05/18/18   Long, [facility], MD  pantoprazole (PROTONIX) 40 MG tablet Take 40 mg by mouth daily. Take 1 tablet daily    [provider]  SUMAtriptan (IMITREX) 100 MG tablet Take 100 mg by mouth 2 (two) times daily as needed for migraine. 07/29/17   [provider]  topiramate (TOPAMAX) 100 MG tablet Take 150 mg by mouth 2 (two) times daily.    [provider]    Allergies    Amoxicillin  Review of Systems   Review of Systems  Respiratory: Negative for cough.   Gastrointestinal: Positive for abdominal pain.  Genitourinary: Negative for dysuria and vaginal bleeding.  All other systems reviewed and are negative.   Physical Exam Updated Vital Signs BP 140/90   Pulse 92   Temp 98.1 F (36.7 C) (Oral)   Resp 20   LMP 08/31/2015   SpO2 98%   Physical Exam CONSTITUTIONAL: Well developed/well nourished HEAD: Normocephalic/atraumatic EYES: EOMI/PERRL, no icterus ENMT: Mucous membranes moist NECK: supple no meningeal signs  SPINE/BACK:entire spine nontender CV: S1/S2 noted, no murmurs/rubs/gallops noted LUNGS: Lungs are clear to auscultation bilaterally, no apparent distress ABDOMEN: soft, obese, no rebound or guarding, bowel sounds noted throughout abdomen, bilateral lower quadrant tenderness, periumbilical tenderness noted GU:no cva tenderness NEURO: Pt is awake/alert/appropriate, moves all extremitiesx4.  No facial droop.   EXTREMITIES: pulses normal/equal, full ROM SKIN: warm, color normal PSYCH: no abnormalities of mood noted, alert and oriented to situation  ED Results / Procedures / Treatments   Labs (all labs ordered are listed, but only abnormal results are displayed)  Labs Reviewed  COMPREHENSIVE METABOLIC PANEL - Abnormal; Notable for the following components:      Result Value   Glucose, Bld 166 (*)    AST 13 (*)    Total Bilirubin 0.1 (*)    All other components within normal limits  CBC - Abnormal; Notable for the following components:   WBC 14.3 (*)    RBC 5.63 (*)    HCT 46.9 (*)    MCH 25.9 (*)    All other components within normal limits  LIPASE, BLOOD  URINALYSIS, ROUTINE W REFLEX MICROSCOPIC  I-STAT BETA HCG BLOOD, ED ([facility], [facility], [facility] ONLY)    EKG None  Radiology CT ABDOMEN PELVIS WO CONTRAST  Result Date: 06/05/2019 CLINICAL DATA:  Abdominal pain EXAM: CT ABDOMEN AND PELVIS WITHOUT CONTRAST TECHNIQUE: Multidetector CT imaging of the abdomen and pelvis was performed following the standard protocol without IV contrast. Patient received 43 mL of Omnipaque 300 on initial attempt to inject contrast material although the IV infiltrated. The patient was counseled with regards to treatment of the infiltrate and expected outcome. COMPARISON:  06/30/2015 FINDINGS: Lower chest: Lung bases are free of acute infiltrate or sizable effusion. Hepatobiliary: No focal liver abnormality is seen. No gallstones, gallbladder wall thickening, or biliary dilatation. Pancreas: Unremarkable. No pancreatic  ductal dilatation or surrounding inflammatory changes. Spleen: Normal in size without focal abnormality. Adrenals/Urinary Tract: Contrast is noted within the renal collecting systems consistent with the prior attempted contrast injection. The bladder is distended with opacified and unopacified urine. 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 No obstructive changes are seen. The appendix is not visualized and has likely been surgically removed. No inflammatory changes are seen. Vascular/Lymphatic: No significant vascular findings are present. No enlarged abdominal or pelvic lymph nodes. Reproductive: Status post hysterectomy. No adnexal masses. Other: No abdominal wall hernia or abnormality. No abdominopelvic ascites. Musculoskeletal: No acute or significant osseous findings. 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 These changes are stable from the prior exam. No obstructive changes are seen. No acute abnormality noted Electronically Signed   By: Inez Catalina M.D.   On: 06/05/2019 02:56   US Pelvis Complete  Result Date: 06/05/2019 CLINICAL DATA:  Lower abdominal pain for 1 day EXAM: TRANSABDOMINAL ULTRASOUND OF PELVIS DOPPLER ULTRASOUND OF OVARIES TECHNIQUE: Transabdominal ultrasound examination of the pelvis was performed including evaluation of the uterus, ovaries, adnexal regions, and pelvic cul-de-sac. Color and duplex Doppler ultrasound was utilized to evaluate blood flow to the ovaries. COMPARISON:  None. FINDINGS: Uterus Surgically removed Right ovary Not visualized Left ovary Not visualized. Other: None IMPRESSION: Status post hysterectomy. The ovaries are not visualized and appeared within normal limits on recent CT. Electronically Signed   By: Inez Catalina M.D.   On: 06/05/2019 03:53   US  PELVIC DOPPLER (TORSION R/O OR MASS ARTERIAL FLOW)  Result Date: 06/05/2019 CLINICAL DATA:  Lower abdominal pain for 1 day EXAM: TRANSABDOMINAL ULTRASOUND OF PELVIS DOPPLER ULTRASOUND OF OVARIES TECHNIQUE: Transabdominal ultrasound examination of the pelvis was performed including evaluation of the uterus, ovaries, adnexal regions, and pelvic cul-de-sac. Color and duplex Doppler ultrasound was utilized to evaluate blood flow to the ovaries. COMPARISON:  None. FINDINGS: Uterus Surgically removed Right ovary Not visualized Left ovary Not visualized. Other: None IMPRESSION: Status post hysterectomy. The ovaries are not visualized  and appeared within normal limits on recent CT. Electronically Signed   By: Inez Catalina M.D.   On: 06/05/2019 03:53    Procedures Procedures  Medications Ordered in ED Medications  sodium chloride flush (NS) 0.9 % injection 3 mL (has no administration in time range)  fentaNYL (SUBLIMAZE) injection 100 mcg (100 mcg Intravenous Given 06/05/19 0130)  ondansetron (ZOFRAN) injection 4 mg (4 mg Intravenous Given 06/05/19 0129)  iohexol (OMNIPAQUE) 300 MG/ML solution 100 mL (100 mLs Intravenous Contrast Given 06/05/19 0142)  HYDROmorphone (DILAUDID) injection 1 mg (1 mg Intravenous Given 06/05/19 0358)    ED Course  I have reviewed the triage vital signs and the nursing notes.  Pertinent labs & imaging results that were available during my care of the patient were reviewed by me and considered in my medical decision making (see chart for details).    MDM Rules/Calculators/A&P                      1:07 AM Patient felt that this was likely ovarian pain.  However on exam she has bilateral lower quadrant tenderness as well as periumbilical tenderness.  She has an elevated white count.  We will proceed with CT imaging 3:15 AM CT scan does not reveal any acute process.  The appendix was not  identified but no secondary signs of appendicitis.  Patient does not think she has had an  appendectomy Patient reports pain is unchanged still has bilateral lower abdominal pain.  We will proceed with pelvic ultrasound 4:34 AM Extensive work-up is unrevealing.  Unclear cause of abdominal pain. Patient overall does feel improved.  She is in no acute distress. She feels comfortable discharge home. She reports these types of pain episodes take 1 to 2 days to improve.    Patient is appropriate for d/c home.  I doubt acute abdominal emergency at this time.  We discussed strict ER return precautions including abdominal pain that migrates to RLQ, fever >100.60F with repetitive vomiting over next 8-12 hours  Final Clinical Impression(s) / ED Diagnoses Final diagnoses:  Pelvic pain    Rx / DC Orders ED Discharge Orders         Ordered    HYDROcodone-acetaminophen (NORCO/VICODIN) 5-325 MG tablet  Every 6 hours PRN     06/05/19 0432           Ripley Fraise, MD 06/05/19 [phone]

## 2019-06-05 NOTE — Progress Notes (Signed)
Pt had 43 mls Omni 300 extravasate in right AC. IV removed, arm elevated, and verbal instructions give to pt by Dr Golden Circle as he assessed. RN notified and extravasation discharge orders placed.

## 2019-06-05 NOTE — Progress Notes (Signed)
Called to see pt regarding Iv contrast infiltration.   Small lump noted above antecubital fossa secondary to the infiltrate.  No pain noted. no bruising seen.  Pt counseled regarding cool compress and expected course.

## 2019-06-05 NOTE — ED Notes (Signed)
RN received call from CT, IV blew when they were trying to use the contrast.  Provider notified, Ice applied.   RN attempted a second IV, unsuccessful.  IV team order placed.

## 2019-06-05 NOTE — Discharge Instructions (Addendum)

## 2019-06-28 ENCOUNTER — Other Ambulatory Visit: Payer: Self-pay | Admitting: Obstetrics and Gynecology

## 2019-07-02 ENCOUNTER — Ambulatory Visit
Admission: EM | Admit: 2019-07-02 | Discharge: 2019-07-02 | Disposition: A | Discharge status: Home / Self Care | Payer: BC Managed Care – PPO | Attending: Physician Assistant | Admitting: Physician Assistant

## 2019-07-02 DIAGNOSIS — M778 Other enthesopathies, not elsewhere classified: Secondary | ICD-10-CM | POA: Diagnosis not present

## 2019-07-02 MED ORDER — MELOXICAM 7.5 MG PO TABS: 7.5000 mg | ORAL_TABLET | Freq: Every day | ORAL | 0 refills | Status: DC

## 2019-07-02 NOTE — Discharge Instructions (Signed)
Start Mobic. Do not take ibuprofen (motrin/advil)/ naproxen (aleve) while on mobic. Ice compress, rest. If symptoms does not improving in 1-2 weeks, follow up with PCP/sports medicine for further evaluation.

## 2019-07-02 NOTE — ED Provider Notes (Signed)
[name]    [name]: [name] Arrival date & time: 07/02/19  [place]      History   Chief Complaint Chief Complaint  Patient presents with  . Shoulder Pain    HPI Kathleen Walsh is a 39 y.o. female.   39 year old female comes in for worsening of chronic left shoulder pain. Denies injury/trauma. Pain is mostly to anterior shoulder, can radiate to the neck/chest. States symptoms were at first intermittent, now constant, and worse with range of motion and heavy lifting.  Has intermittent numbness and tingling of the fingers.  Denies loss of grip strength. Work requires heavy lifting/strenuous activity.  Has not taken anything for the same.     Past Medical History:  Diagnosis Date  . Abdominal wall pain   . Anemia   . Anxiety   . Arthritis    left ankle  . Blood dyscrasia    lupus anticoagulant during pregnancy  . Depression    takes cymbalta   . Headache(784.0)    migraines  . Sleep apnea    USES C-PAP    Patient Active Problem List   Diagnosis Date Noted  . Pulmonary nodule 08/08/2017  . OSA on CPAP 08/08/2017  . Acute back pain with sciatica, left 08/14/2016  . Trochanteric bursitis, left hip 08/12/2016  . Impingement syndrome of left ankle 07/20/2016  . 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 Pain in left ankle and joints of left foot 07/13/2016  . Chronic left-sided low back pain with left-sided sciatica 07/13/2016  . Abnormal EKG 04/12/2016  . Cardiomegaly 08/15/2013  . SOB (shortness of breath) 08/15/2013  . Pleuritic chest pain 08/15/2013  . Postpartum care following cesarean delivery and tubal sterilization (10/13) 02/11/2013  . Abdominal muscle strain 12/20/2011  . Constipation 07/06/2011  . Abdominal pain 07/06/2011  . DIABETES MELLITUS, GESTATIONAL 04/17/2009  . DYSPNEA 04/17/2009    Past Surgical History:  Procedure Laterality Date  . ABDOMINAL HYSTERECTOMY    . CESAREAN SECTION WITH BILATERAL TUBAL LIGATION Bilateral 02/11/2013   Procedure: Repeat CESAREAN SECTION WITH BILATERAL TUBAL LIGATION;  Surgeon: Lovenia Kim, MD;  Location: [facility];  Service: Obstetrics;  Laterality: Bilateral;  EDD: 03/01/13  . DEBRIDEMENT OF ABDOMINAL WALL ABSCESS N/A 12/03/2014   Procedure: INCISION OF ABDOMINAL WALL LIPOMA;  Surgeon: Greer Pickerel, MD;  Location: [facility];  Service: General;  Laterality: N/A;  . DILATION AND CURETTAGE OF UTERUS  2004  . DILITATION & CURRETTAGE/HYSTROSCOPY WITH NOVASURE ABLATION N/A 11/19/2014   Procedure: DILATATION & CURETTAGE/HYSTEROSCOPY WITH NOVASURE ABLATION;  Surgeon: Brien Few, MD;  Location: [facility];  Service: Gynecology;  Laterality: N/A;  . ENDOMETRIAL ABLATION  10/20/14  . FOOT SURGERY  2008  . HERNIA REPAIR  09/19/2011  . LAPAROSCOPY N/A 12/03/2014   Procedure: LAPAROSCOPY DIAGNOSTIC WITH LYSIS OF ADHESIONS;  Surgeon: Greer Pickerel, MD;  Location: [facility];  Service: General;  Laterality: N/A;  . TUBAL LIGATION    . VENTRAL HERNIA REPAIR  09/20/10   Laparoscopic, Dr Greer Pickerel  . WISDOM TOOTH EXTRACTION      OB History    Gravida  4   Para  2   Term  2   Preterm      AB  2   Living  2     SAB      TAB      Ectopic      Multiple      Live Births  1  Home Medications    Prior to Admission medications   Medication Sig Start Date End Date Taking? Authorizing Provider  albuterol (PROVENTIL HFA;VENTOLIN HFA) 108 (90 Base) MCG/ACT inhaler Inhale 2 puffs into the lungs every 6 (six) hours as needed for wheezing or shortness of breath. 02/05/18   Magdalen Spatz, NP  buPROPion (WELLBUTRIN XL) 300 MG 24 hr tablet Take 300 mg by mouth daily. 01/03/18   [provider]  DULoxetine (CYMBALTA) 60 MG capsule Take 60 mg by mouth 2 (two) times daily.    [provider]  furosemide (LASIX) 40 MG tablet Take 40 mg by mouth every morning.     [provider]  meloxicam (MOBIC) 7.5 MG tablet Take 1 tablet (7.5 mg total) by mouth daily. 07/02/19   [name],  V,  PA-C  SUMAtriptan (IMITREX) 100 MG tablet Take 100 mg by mouth 2 (two) times daily as needed for migraine. 07/29/17   [provider]  topiramate (TOPAMAX) 100 MG tablet Take 150 mg by mouth 2 (two) times daily.    [provider]    Family History Family History  Problem Relation Age of Onset  . Diabetes Mother   . Hypertension Mother   . Diabetes Father   . Hypertension Father     Social History Social History   Tobacco Use  . Smoking status: Former Smoker    Packs/day: 0.75    Years: 23.00    Pack years: 17.25    Types: Cigarettes    Start date: 05/2017  . Smokeless tobacco: Never Used  Substance Use Topics  . Alcohol use: No    Comment: "once every three years"   . Drug use: No     Allergies   Amoxicillin   Review of Systems Review of Systems  Reason unable to perform ROS: See HPI as above.     Physical Exam Triage Vital Signs ED Triage Vitals  Enc Vitals Group     BP 07/02/19 1701 (!) 148/90     Pulse Rate 07/02/19 1701 95     Resp 07/02/19 1701 18     Temp 07/02/19 1701 98.8 F (37.1 C)     Temp Source 07/02/19 1701 Oral     SpO2 07/02/19 1701 97 %     Weight --      Height --      Head Circumference --      Peak Flow --      Pain Score 07/02/19 1702 4     Pain Loc --      Pain Edu? --      Excl. in [place]? --    No data found.  Updated Vital Signs BP (!) 148/90 (BP Location: Right Arm)   Pulse 95   Temp 98.8 F (37.1 C) (Oral)   Resp 18   LMP 08/31/2015   SpO2 97%   Physical Exam Constitutional:      General: She is not in acute distress.    Appearance: Normal appearance. She is well-developed. She is not toxic-appearing or diaphoretic.  HENT:     Head: Normocephalic and atraumatic.  Eyes:     Conjunctiva/sclera: Conjunctivae normal.     Pupils: Pupils are equal, round, and reactive to light.  Cardiovascular:     Rate and Rhythm: Normal rate and regular rhythm.  Pulmonary:     Effort: Pulmonary effort is normal.  No respiratory distress.     Comments: Speaking in full sentences without difficulty. LCTAB Musculoskeletal:  Cervical back: Normal range of motion and neck supple.     Comments: No swelling, erythema, warmth, contusion.  No tenderness to palpation of spinous processes.  Tenderness to palpation of left neck, middle trapezius.  Tenderness to palpation of left chest, anterior shoulder, along bicep tendon insertion.  No tenderness to palpation along the deltoid, triceps.  No tenderness to palpation of the elbow.  Full range of motion of shoulder, neck, elbow.  Strength 5/5 BUE.  Sensation intact ankle bilaterally.  Radial pulse 2+, cap refill less than 2 seconds.  Skin:    General: Skin is warm and dry.  Neurological:     Mental Status: She is alert and oriented to person, place, and time.      UC Treatments / Results  Labs (all labs ordered are listed, but only abnormal results are displayed) Labs Reviewed - No data to display  EKG   Radiology No results found.  Procedures Procedures (including critical care time)  Medications Ordered in UC Medications - No data to display  Initial Impression / Assessment and Plan / UC Course  I have reviewed the triage vital signs and the nursing notes.  Pertinent labs & imaging results that were available during my care of the patient were reviewed by me and considered in my medical decision making (see chart for details).    Discussed history and exam most consistent with tendinitis.  NSAIDs, ice compress, rest.  Return precautions given.  Patient expresses understanding increased pain.  Final Clinical Impressions(s) / UC Diagnoses   Final diagnoses:  Left shoulder tendinitis   ED Prescriptions    Medication Sig Dispense Auth. Provider   meloxicam (MOBIC) 7.5 MG tablet Take 1 tablet (7.5 mg total) by mouth daily. 15 tablet Ok Edwards, PA-C     I have reviewed the PDMP during this encounter.   Ok Edwards, PA-C 07/02/19 1757

## 2019-07-02 NOTE — ED Triage Notes (Signed)
Pt c/o lt shoulder pain for over the past 19months. States worsen in past 3wks. Denies injury, states works at [employer].

## 2019-07-10 DIAGNOSIS — R7303 Prediabetes: Secondary | ICD-10-CM | POA: Diagnosis not present

## 2019-07-10 DIAGNOSIS — G4733 Obstructive sleep apnea (adult) (pediatric): Secondary | ICD-10-CM | POA: Diagnosis not present

## 2019-07-10 DIAGNOSIS — M25512 Pain in left shoulder: Secondary | ICD-10-CM | POA: Diagnosis not present

## 2019-07-16 DIAGNOSIS — M25512 Pain in left shoulder: Secondary | ICD-10-CM | POA: Diagnosis not present

## 2019-07-16 DIAGNOSIS — M542 Cervicalgia: Secondary | ICD-10-CM | POA: Diagnosis not present

## 2019-07-22 DIAGNOSIS — M542 Cervicalgia: Secondary | ICD-10-CM | POA: Diagnosis not present

## 2019-07-22 DIAGNOSIS — M7522 Bicipital tendinitis, left shoulder: Secondary | ICD-10-CM | POA: Diagnosis not present

## 2019-07-22 DIAGNOSIS — M6281 Muscle weakness (generalized): Secondary | ICD-10-CM | POA: Diagnosis not present

## 2019-07-22 DIAGNOSIS — M25612 Stiffness of left shoulder, not elsewhere classified: Secondary | ICD-10-CM | POA: Diagnosis not present

## 2019-07-29 DIAGNOSIS — I10 Essential (primary) hypertension: Secondary | ICD-10-CM | POA: Diagnosis not present

## 2019-07-29 DIAGNOSIS — F418 Other specified anxiety disorders: Secondary | ICD-10-CM | POA: Diagnosis not present

## 2019-07-29 DIAGNOSIS — Z6841 Body Mass Index (BMI) 40.0 and over, adult: Secondary | ICD-10-CM | POA: Diagnosis not present

## 2019-07-31 DIAGNOSIS — M25612 Stiffness of left shoulder, not elsewhere classified: Secondary | ICD-10-CM | POA: Diagnosis not present

## 2019-07-31 DIAGNOSIS — M6281 Muscle weakness (generalized): Secondary | ICD-10-CM | POA: Diagnosis not present

## 2019-07-31 DIAGNOSIS — M7522 Bicipital tendinitis, left shoulder: Secondary | ICD-10-CM | POA: Diagnosis not present

## 2019-07-31 DIAGNOSIS — M542 Cervicalgia: Secondary | ICD-10-CM | POA: Diagnosis not present

## 2019-08-05 DIAGNOSIS — M25612 Stiffness of left shoulder, not elsewhere classified: Secondary | ICD-10-CM | POA: Diagnosis not present

## 2019-08-05 DIAGNOSIS — M7522 Bicipital tendinitis, left shoulder: Secondary | ICD-10-CM | POA: Diagnosis not present

## 2019-08-05 DIAGNOSIS — M6281 Muscle weakness (generalized): Secondary | ICD-10-CM | POA: Diagnosis not present

## 2019-08-05 DIAGNOSIS — M542 Cervicalgia: Secondary | ICD-10-CM | POA: Diagnosis not present

## 2019-08-06 DIAGNOSIS — Z6841 Body Mass Index (BMI) 40.0 and over, adult: Secondary | ICD-10-CM | POA: Diagnosis not present

## 2019-08-06 DIAGNOSIS — F418 Other specified anxiety disorders: Secondary | ICD-10-CM | POA: Diagnosis not present

## 2019-08-06 DIAGNOSIS — L918 Other hypertrophic disorders of the skin: Secondary | ICD-10-CM | POA: Diagnosis not present

## 2019-08-13 DIAGNOSIS — G4733 Obstructive sleep apnea (adult) (pediatric): Secondary | ICD-10-CM | POA: Diagnosis not present

## 2019-08-13 DIAGNOSIS — R0602 Shortness of breath: Secondary | ICD-10-CM | POA: Diagnosis not present

## 2019-08-14 DIAGNOSIS — G4733 Obstructive sleep apnea (adult) (pediatric): Secondary | ICD-10-CM | POA: Diagnosis not present

## 2019-08-14 DIAGNOSIS — R0602 Shortness of breath: Secondary | ICD-10-CM | POA: Diagnosis not present

## 2019-08-19 DIAGNOSIS — F418 Other specified anxiety disorders: Secondary | ICD-10-CM | POA: Diagnosis not present

## 2019-08-19 DIAGNOSIS — G4733 Obstructive sleep apnea (adult) (pediatric): Secondary | ICD-10-CM | POA: Diagnosis not present

## 2019-08-19 DIAGNOSIS — Z79899 Other long term (current) drug therapy: Secondary | ICD-10-CM | POA: Diagnosis not present

## 2019-08-19 DIAGNOSIS — Z9989 Dependence on other enabling machines and devices: Secondary | ICD-10-CM | POA: Diagnosis not present

## 2019-08-19 DIAGNOSIS — I10 Essential (primary) hypertension: Secondary | ICD-10-CM | POA: Diagnosis not present

## 2019-08-28 DIAGNOSIS — G4733 Obstructive sleep apnea (adult) (pediatric): Secondary | ICD-10-CM | POA: Diagnosis not present

## 2019-08-29 ENCOUNTER — Other Ambulatory Visit: Payer: Self-pay | Admitting: Orthopaedic Surgery

## 2019-08-29 DIAGNOSIS — M25612 Stiffness of left shoulder, not elsewhere classified: Secondary | ICD-10-CM | POA: Diagnosis not present

## 2019-08-29 DIAGNOSIS — M25512 Pain in left shoulder: Secondary | ICD-10-CM

## 2019-09-13 ENCOUNTER — Other Ambulatory Visit: Payer: Self-pay

## 2019-09-13 ENCOUNTER — Ambulatory Visit
Admission: RE | Admit: 2019-09-13 | Discharge: 2019-09-13 | Disposition: A | Discharge status: Home / Self Care | Payer: BC Managed Care – PPO | Source: Ambulatory Visit | Attending: Orthopaedic Surgery | Admitting: Orthopaedic Surgery

## 2019-09-13 DIAGNOSIS — M75102 Unspecified rotator cuff tear or rupture of left shoulder, not specified as traumatic: Secondary | ICD-10-CM | POA: Diagnosis not present

## 2019-09-13 DIAGNOSIS — M25512 Pain in left shoulder: Secondary | ICD-10-CM

## 2019-09-19 DIAGNOSIS — M25612 Stiffness of left shoulder, not elsewhere classified: Secondary | ICD-10-CM | POA: Diagnosis not present

## 2019-09-23 NOTE — Patient Instructions (Addendum)
DUE TO COVID-19 ONLY ONE VISITOR IS ALLOWED TO COME WITH YOU AND STAY IN THE WAITING ROOM ONLY DURING PRE OP AND PROCEDURE DAY OF SURGERY. THE 1 VISITOR MAY VISIT WITH YOU AFTER SURGERY IN YOUR PRIVATE ROOM DURING VISITING HOURS ONLY!  YOU NEED TO HAVE A COVID 19 TEST ON: 09/28/19 @ 12:00 pm , THIS TEST MUST BE DONE BEFORE SURGERY, COME  [place], [place] , [id number].  ([place]) ONCE YOUR COVID TEST IS COMPLETED, PLEASE BEGIN THE QUARANTINE INSTRUCTIONS AS OUTLINED IN YOUR HANDOUT.                Kathleen Walsh    Your procedure is scheduled on: 10/02/19   Report to [facility] Main  Entrance   Report to admitting at: 11:30 AM     Call this number if you have problems the morning of surgery [phone]    Remember:   NO SOLID FOOD AFTER MIDNIGHT THE NIGHT PRIOR TO SURGERY. NOTHING BY MOUTH EXCEPT CLEAR LIQUIDS UNTIL: 10:30 am . PLEASE FINISH ENSURE DRINK PER SURGEON ORDER  WHICH NEEDS TO BE COMPLETED AT: 10:30 am .   CLEAR LIQUID DIET   Foods Allowed                                                                     Foods Excluded  Coffee and tea, regular and decaf                             liquids that you cannot  Plain Jell-O any favor except red or purple                                           see through such as: Fruit ices (not with fruit pulp)                                     milk, soups, orange juice  Iced Popsicles                                    All solid food Carbonated beverages, regular and diet                                    Cranberry, grape and apple juices Sports drinks like Gatorade Lightly seasoned clear broth or consume(fat free) Sugar, honey syrup  Sample Menu Breakfast                                Lunch                                     Supper Cranberry juice  Beef broth                            Chicken broth Jell-O                                     Grape juice                            Apple juice Coffee or tea                        Jell-O                                      Popsicle                                                Coffee or tea                        Coffee or tea  _____________________________________________________________________  BRUSH YOUR TEETH MORNING OF SURGERY AND RINSE YOUR MOUTH OUT, NO CHEWING GUM CANDY OR MINTS.     Take these medicines the morning of surgery with A SIP OF WATER: bupropion,duloxetine,topamax.Use inhalers. Sumatriptan as needed.                                 You may not have any metal on your body including hair pins and              piercings  Do not wear jewelry, make-up, lotions, powders or perfumes, deodorant             Do not wear nail polish on your fingernails.  Do not shave  48 hours prior to surgery.               Do not bring valuables to the hospital. [place].  Contacts, dentures or bridgework may not be worn into surgery.  Leave suitcase in the car. After surgery it may be brought to your room.     Patients discharged the day of surgery will not be allowed to drive home. IF YOU ARE HAVING SURGERY AND GOING HOME THE SAME DAY, YOU MUST HAVE AN ADULT TO DRIVE YOU HOME AND BE WITH YOU FOR 24 HOURS. YOU MAY GO HOME BY TAXI OR UBER OR ORTHERWISE, BUT AN ADULT MUST ACCOMPANY YOU HOME AND STAY WITH YOU FOR 24 HOURS.  Name and phone number of your driver:  Special Instructions: N/A              Please read over the following fact sheets you were given: _____________________________________________________________________  [facility] - Preparing for Surgery Before surgery, you can play an important role.  Because skin is not sterile, your skin needs to be as free of germs as possible.  You can reduce the number of germs on your skin by washing with CHG (  chlorahexidine gluconate) soap before surgery.  CHG is an antiseptic cleaner which kills germs and bonds  with the skin to continue killing germs even after washing. Please DO NOT use if you have an allergy to CHG or antibacterial soaps.  If your skin becomes reddened/irritated stop using the CHG and inform your nurse when you arrive at Short Stay. Do not shave (including legs and underarms) for at least 48 hours prior to the first CHG shower.  You may shave your face/neck. Please follow these instructions carefully:  1.  Shower with CHG Soap the night before surgery and the  morning of Surgery.  2.  If you choose to wash your hair, wash your hair first as usual with your  normal  shampoo.  3.  After you shampoo, rinse your hair and body thoroughly to remove the  shampoo.                           4.  Use CHG as you would any other liquid soap.  You can apply chg directly  to the skin and wash                       Gently with a scrungie or clean washcloth.  5.  Apply the CHG Soap to your body ONLY FROM THE NECK DOWN.   Do not use on face/ open                           Wound or open sores. Avoid contact with eyes, ears mouth and genitals (private parts).                       Wash face,  Genitals (private parts) with your normal soap.             6.  Wash thoroughly, paying special attention to the area where your surgery  will be performed.  7.  Thoroughly rinse your body with warm water from the neck down.  8.  DO NOT shower/wash with your normal soap after using and rinsing off  the CHG Soap.                9.  Pat yourself dry with a clean towel.            10.  Wear clean pajamas.            11.  Place clean sheets on your bed the night of your first shower and do not  sleep with pets. Day of Surgery : Do not apply any lotions/deodorants the morning of surgery.  Please wear clean clothes to the hospital/surgery center.  FAILURE TO FOLLOW THESE INSTRUCTIONS MAY RESULT IN THE CANCELLATION OF YOUR SURGERY PATIENT SIGNATURE_________________________________  NURSE  SIGNATURE__________________________________  ________________________________________________________________________

## 2019-09-25 ENCOUNTER — Other Ambulatory Visit: Payer: Self-pay

## 2019-09-25 ENCOUNTER — Encounter (HOSPITAL_COMMUNITY)
Admission: RE | Admit: 2019-09-25 | Discharge: 2019-09-25 | Disposition: A | Discharge status: Home / Self Care | Payer: BC Managed Care – PPO | Source: Ambulatory Visit | Attending: Orthopaedic Surgery | Admitting: Orthopaedic Surgery

## 2019-09-25 ENCOUNTER — Encounter (HOSPITAL_COMMUNITY): Payer: Self-pay

## 2019-09-25 DIAGNOSIS — M19012 Primary osteoarthritis, left shoulder: Secondary | ICD-10-CM | POA: Diagnosis not present

## 2019-09-25 DIAGNOSIS — M7542 Impingement syndrome of left shoulder: Secondary | ICD-10-CM | POA: Insufficient documentation

## 2019-09-25 DIAGNOSIS — G4730 Sleep apnea, unspecified: Secondary | ICD-10-CM | POA: Insufficient documentation

## 2019-09-25 DIAGNOSIS — Z79899 Other long term (current) drug therapy: Secondary | ICD-10-CM | POA: Diagnosis not present

## 2019-09-25 DIAGNOSIS — I10 Essential (primary) hypertension: Secondary | ICD-10-CM | POA: Diagnosis not present

## 2019-09-25 DIAGNOSIS — Z01818 Encounter for other preprocedural examination: Secondary | ICD-10-CM | POA: Diagnosis not present

## 2019-09-25 DIAGNOSIS — Z87891 Personal history of nicotine dependence: Secondary | ICD-10-CM | POA: Diagnosis not present

## 2019-09-25 DIAGNOSIS — R7303 Prediabetes: Secondary | ICD-10-CM | POA: Insufficient documentation

## 2019-09-25 HISTORY — DX: Essential (primary) hypertension: I10

## 2019-09-25 HISTORY — DX: Prediabetes: R73.03

## 2019-09-25 LAB — CBC
HCT: 46.9 % — ABNORMAL HIGH (ref 36.0–46.0)
Hemoglobin: 14.5 g/dL (ref 12.0–15.0)
MCH: 26.2 pg (ref 26.0–34.0)
MCHC: 30.9 g/dL (ref 30.0–36.0)
MCV: 84.8 fL (ref 80.0–100.0)
Platelets: 270 10*3/uL (ref 150–400)
RBC: 5.53 MIL/uL — ABNORMAL HIGH (ref 3.87–5.11)
RDW: 14.4 % (ref 11.5–15.5)
WBC: 13.4 10*3/uL — ABNORMAL HIGH (ref 4.0–10.5)
nRBC: 0 % (ref 0.0–0.2)

## 2019-09-25 LAB — HEMOGLOBIN A1C
Hgb A1c MFr Bld: 6.6 % — ABNORMAL HIGH (ref 4.8–5.6)
Mean Plasma Glucose: 142.72 mg/dL

## 2019-09-25 NOTE — Progress Notes (Signed)
PCP - [place] Cardiologist - Dr. Jola Schmidt  Chest x-ray -  EKG -  Stress Test -  ECHO - 2019 EPIC Cardiac Cath -   Sleep Study - yes CPAP - yes  Fasting Blood Sugar -  Checks Blood Sugar _____ times a day  Blood Thinner Instructions: Aspirin Instructions: Last Dose:  Anesthesia review:   Patient denies shortness of breath, fever, cough and chest pain at PAT appointment   Patient verbalized understanding of instructions that were given to them at the PAT appointment. Patient was also instructed that they will need to review over the PAT instructions again at home before surgery.

## 2019-09-27 DIAGNOSIS — G4733 Obstructive sleep apnea (adult) (pediatric): Secondary | ICD-10-CM | POA: Diagnosis not present

## 2019-09-27 NOTE — H&P (Signed)
PREOPERATIVE H&P  Chief Complaint: LEFT SHOULDER CARTILEDGE DISORDER, OSTEARTHRITIS, IMPINGMENT SYNDROME, BICEP TENODITIS  HPI: Kathleen Walsh is a 39 y.o. female who is scheduled for LEFT SHOULDER ARTHROSCOPY WITH SUBACROMIAL DECOMPRESSION AND DISTAL CLAVICLE EXCISION, BICEP TENODESIS.   Patient has a past medical history significant for sleep apnea, pre-diabetes, HTN, blood dyscrasias, and anemia.   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  We have been following her for left shoulder pain.  She made no progress with injections, physical therapy and anti-inflammatories.    Her symptoms are rated as moderate to severe, and have been worsening.  This is significantly impairing activities of daily living.    Please see clinic note for further details on this patient's care.    She has elected for surgical management.   Past Medical History:  Diagnosis Date  . Abdominal wall pain   . Anemia   . Anxiety   . Arthritis    left ankle  . Blood dyscrasia    lupus anticoagulant during pregnancy  . Depression    takes cymbalta   . Headache(784.0)    migraines  . Hypertension   . Pre-diabetes   . Sleep apnea    USES C-PAP   Past Surgical History:  Procedure Laterality Date  . ABDOMINAL HYSTERECTOMY    . ANKLE ARTHROSCOPY WITH REPAIR SUBLUXING TENDON Left   . CESAREAN SECTION WITH BILATERAL TUBAL LIGATION Bilateral 02/11/2013   Procedure: Repeat CESAREAN SECTION WITH BILATERAL TUBAL LIGATION;  Surgeon: Lovenia Kim, MD;  Location: [facility];  Service: Obstetrics;  Laterality: Bilateral;  EDD: 03/01/13  . DEBRIDEMENT OF ABDOMINAL WALL ABSCESS N/A 12/03/2014   Procedure: INCISION OF ABDOMINAL WALL LIPOMA;  Surgeon: Greer Pickerel, MD;  Location: [facility];  Service: General;  Laterality: N/A;  . DILATION AND CURETTAGE OF UTERUS  2004  . DILITATION & CURRETTAGE/HYSTROSCOPY WITH NOVASURE ABLATION N/A 11/19/2014   Procedure: DILATATION & CURETTAGE/HYSTEROSCOPY  WITH NOVASURE ABLATION;  Surgeon: Brien Few, MD;  Location: [facility];  Service: Gynecology;  Laterality: N/A;  . ENDOMETRIAL ABLATION  10/20/14  . FOOT SURGERY  2008  . HERNIA REPAIR  09/19/2011  . LAPAROSCOPY N/A 12/03/2014   Procedure: LAPAROSCOPY DIAGNOSTIC WITH LYSIS OF ADHESIONS;  Surgeon: Greer Pickerel, MD;  Location: [facility];  Service: General;  Laterality: N/A;  . NASAL SEPTUM SURGERY    . TUBAL LIGATION    . VENTRAL HERNIA REPAIR  09/20/10   Laparoscopic, Dr Greer Pickerel  . WISDOM TOOTH EXTRACTION     Social History   Socioeconomic History  . Marital status: Married    Spouse name: Not on file  . Number of children: Not on file  . Years of education: Not on file  . Highest education level: Not on file  Occupational History  . Not on file  Tobacco Use  . Smoking status: Former Smoker    Packs/day: 0.75    Years: 23.00    Pack years: 17.25    Types: Cigarettes    Start date: 05/2017  . Smokeless tobacco: Never Used  Substance and Sexual Activity  . Alcohol use: No    Comment: "once every three years"   . Drug use: No  . Sexual activity: Yes    Birth control/protection: Surgical  Other Topics Concern  . Not on file  Social History Narrative  . Not on file   Social Determinants of Health   Financial Resource Strain:   . Difficulty of Paying  Living Expenses:   Food Insecurity:   . Worried About Charity fundraiser in the Last Year:   . Arboriculturist in the Last Year:   Transportation Needs:   . Film/video editor (Medical):   Marland Kitchen Lack of Transportation (Non-Medical):   Physical Activity:   . Days of Exercise per Week:   . Minutes of Exercise per Session:   Stress:   . Feeling of Stress :   Social Connections:   . Frequency of Communication with Friends and Family:   . Frequency of Social Gatherings with Friends and Family:   . Attends Religious Services:   . Active Member of Clubs or Organizations:   . Attends Archivist Meetings:   Marland Kitchen Marital  Status:    Family History  Problem Relation Age of Onset  . Diabetes Mother   . Hypertension Mother   . Diabetes Father   . Hypertension Father    Allergies  Allergen Reactions  . Amoxicillin Nausea And Vomiting    Has patient had a PCN reaction causing immediate rash, facial/tongue/throat swelling, SOB or lightheadedness with hypotension: No Has patient had a PCN reaction causing severe rash involving mucus membranes or skin necrosis: No Has patient had a PCN reaction that required hospitalization: No Has patient had a PCN reaction occurring within the last 10 years: Yes If all of the above answers are "NO", then may proceed with Cephalosporin use.    Prior to Admission medications   Medication Sig Start Date End Date Taking? Authorizing Provider  albuterol (PROVENTIL HFA;VENTOLIN HFA) 108 (90 Base) MCG/ACT inhaler Inhale 2 puffs into the lungs every 6 (six) hours as needed for wheezing or shortness of breath. 02/05/18  Yes Magdalen Spatz, NP  busPIRone (BUSPAR) 15 MG tablet Take 15 mg by mouth daily. 07/16/19  Yes [provider]  diclofenac (VOLTAREN) 75 MG EC tablet Take 75 mg by mouth 2 (two) times daily. 08/29/19  Yes [provider]  loratadine (CLARITIN) 10 MG tablet Take 10 mg by mouth daily.   Yes [provider]  SUMAtriptan (IMITREX) 100 MG tablet Take 100 mg by mouth 2 (two) times daily as needed for migraine. 07/29/17  Yes [provider]  topiramate (TOPAMAX) 50 MG tablet Take 50 mg by mouth daily.    Yes [provider]  triamterene-hydrochlorothiazide (MAXZIDE-25) 37.5-25 MG tablet Take 1 tablet by mouth daily. 08/21/19  Yes [provider]  TRINTELLIX 20 MG TABS tablet Take 20 mg by mouth daily. 09/20/19  Yes [provider]  meloxicam (MOBIC) 7.5 MG tablet Take 1 tablet (7.5 mg total) by mouth daily. Patient not taking: Reported on 09/23/2019 07/02/19   Ok Edwards, PA-C    ROS: All other systems have been  reviewed and were otherwise negative with the exception of those mentioned in the HPI and as above.  Physical Exam: General: Alert, no acute distress Cardiovascular: No pedal edema Respiratory: No cyanosis, no use of accessory musculature GI: No organomegaly, abdomen is soft and non-tender Skin: No lesions in the area of chief complaint Neurologic: Sensation intact distally Psychiatric: Patient is competent for consent with normal mood and affect Lymphatic: No axillary or cervical lymphadenopathy  MUSCULOSKELETAL:  Left shoulder: active forward elevation to 90 degrees , passive forward elevation to 120 degrees, this is limited due to pain. She does have some weakness with supraspinatus testing but does  report pain with all cuff strength testing. Positive O'Brien's, positive impingement. Positive AC  tenderness to palpation.   Imaging: MRI reviewed demonstrates bursal-sided tear of the mid portion of the distal infraspinatus, about 25% depth reported on the radiology report.  Some fluid around the biceps.  Type II acromion.    Assessment: Left shoulder impingement and partial thickness cuff tear.    Plan: Plan for Procedure(s): LEFT SHOULDER ARTHROSCOPY WITH SUBACROMIAL DECOMPRESSION AND DISTAL CLAVICLE EXCISION, BICEP TENODESIS  Dr. Griffin Basil and the patient discussed her options at length.  She has failed nonoperative measures.  She is very frustrated by her shoulder.  We talked about risks, benefits and alternatives of an arthroscopic subacromial decompression, distal clavicle excision, possible biceps tenodesis versus rotator cuff repair.  The patient elected to proceed.    The risks benefits and alternatives were discussed with the patient including but not limited to the risks of nonoperative treatment, versus surgical intervention including infection, bleeding, nerve injury,  blood clots, cardiopulmonary complications, morbidity, mortality, among others, and they were willing to  proceed.   The patient acknowledged the explanation, agreed to proceed with the plan and consent was signed.   Operative Plan: Left shoulder scope with SAD, DCE, possible BT, and possible cuff repair Discharge Medications: Tylenol, Diclofenac, Oxycodone, Zofran DVT Prophylaxis: +/- due to patient's history of antiphospholipid antibody Physical Therapy: Outpatient PT Special Discharge needs: [place], PA-C  09/27/2019 3:26 PM

## 2019-09-28 ENCOUNTER — Other Ambulatory Visit (HOSPITAL_COMMUNITY)
Admission: RE | Admit: 2019-09-28 | Discharge: 2019-09-28 | Disposition: A | Discharge status: Home / Self Care | Payer: BC Managed Care – PPO | Source: Ambulatory Visit | Attending: Orthopaedic Surgery | Admitting: Orthopaedic Surgery

## 2019-09-28 DIAGNOSIS — Z01812 Encounter for preprocedural laboratory examination: Secondary | ICD-10-CM | POA: Insufficient documentation

## 2019-09-28 DIAGNOSIS — Z20822 Contact with and (suspected) exposure to covid-19: Secondary | ICD-10-CM | POA: Diagnosis not present

## 2019-09-28 LAB — SARS CORONAVIRUS 2 (TAT 6-24 HRS): SARS Coronavirus 2: NEGATIVE

## 2019-10-02 ENCOUNTER — Ambulatory Visit (HOSPITAL_COMMUNITY): Payer: BC Managed Care – PPO

## 2019-10-02 ENCOUNTER — Other Ambulatory Visit: Payer: Self-pay

## 2019-10-02 ENCOUNTER — Ambulatory Visit (HOSPITAL_COMMUNITY)
Admission: RE | Admit: 2019-10-02 | Discharge: 2019-10-02 | Disposition: A | Discharge status: Home / Self Care | Payer: BC Managed Care – PPO | Attending: Orthopaedic Surgery | Admitting: Orthopaedic Surgery

## 2019-10-02 ENCOUNTER — Encounter (HOSPITAL_COMMUNITY): Payer: Self-pay | Admitting: Orthopaedic Surgery

## 2019-10-02 ENCOUNTER — Encounter (HOSPITAL_COMMUNITY)
Admission: RE | Disposition: A | Discharge status: Home / Self Care | Payer: Self-pay | Source: Home / Self Care | Attending: Orthopaedic Surgery

## 2019-10-02 DIAGNOSIS — Z79899 Other long term (current) drug therapy: Secondary | ICD-10-CM | POA: Diagnosis not present

## 2019-10-02 DIAGNOSIS — M24112 Other articular cartilage disorders, left shoulder: Secondary | ICD-10-CM | POA: Diagnosis not present

## 2019-10-02 DIAGNOSIS — M7522 Bicipital tendinitis, left shoulder: Secondary | ICD-10-CM | POA: Diagnosis not present

## 2019-10-02 DIAGNOSIS — Z880 Allergy status to penicillin: Secondary | ICD-10-CM | POA: Diagnosis not present

## 2019-10-02 DIAGNOSIS — M7542 Impingement syndrome of left shoulder: Secondary | ICD-10-CM | POA: Diagnosis not present

## 2019-10-02 DIAGNOSIS — G4730 Sleep apnea, unspecified: Secondary | ICD-10-CM | POA: Insufficient documentation

## 2019-10-02 DIAGNOSIS — G8918 Other acute postprocedural pain: Secondary | ICD-10-CM | POA: Diagnosis not present

## 2019-10-02 DIAGNOSIS — M19012 Primary osteoarthritis, left shoulder: Secondary | ICD-10-CM | POA: Diagnosis not present

## 2019-10-02 DIAGNOSIS — I10 Essential (primary) hypertension: Secondary | ICD-10-CM | POA: Diagnosis not present

## 2019-10-02 DIAGNOSIS — X58XXXA Exposure to other specified factors, initial encounter: Secondary | ICD-10-CM | POA: Diagnosis not present

## 2019-10-02 DIAGNOSIS — S43432A Superior glenoid labrum lesion of left shoulder, initial encounter: Secondary | ICD-10-CM | POA: Diagnosis not present

## 2019-10-02 DIAGNOSIS — Z791 Long term (current) use of non-steroidal anti-inflammatories (NSAID): Secondary | ICD-10-CM | POA: Diagnosis not present

## 2019-10-02 DIAGNOSIS — G43909 Migraine, unspecified, not intractable, without status migrainosus: Secondary | ICD-10-CM | POA: Diagnosis not present

## 2019-10-02 DIAGNOSIS — Z87891 Personal history of nicotine dependence: Secondary | ICD-10-CM | POA: Insufficient documentation

## 2019-10-02 DIAGNOSIS — F329 Major depressive disorder, single episode, unspecified: Secondary | ICD-10-CM | POA: Diagnosis not present

## 2019-10-02 DIAGNOSIS — R7303 Prediabetes: Secondary | ICD-10-CM | POA: Insufficient documentation

## 2019-10-02 DIAGNOSIS — M25812 Other specified joint disorders, left shoulder: Secondary | ICD-10-CM | POA: Insufficient documentation

## 2019-10-02 SURGERY — SHOULDER ARTHROSCOPY WITH SUBACROMIAL DECOMPRESSION AND DISTAL CLAVICLE EXCISION
Anesthesia: General | Site: Shoulder | Laterality: Left

## 2019-10-02 MED ORDER — EPINEPHRINE PF 1 MG/ML IJ SOLN
INTRAMUSCULAR | Status: DC | PRN
  Administered 2019-10-02: 2 mg

## 2019-10-02 MED ORDER — SUGAMMADEX SODIUM 200 MG/2ML IV SOLN
INTRAVENOUS | Status: DC | PRN
  Administered 2019-10-02: 600 mg via INTRAVENOUS

## 2019-10-02 MED ORDER — ORAL CARE MOUTH RINSE: 15.0000 mL | Freq: Once | OROMUCOSAL | Status: AC

## 2019-10-02 MED ORDER — LIDOCAINE 2% (20 MG/ML) 5 ML SYRINGE
INTRAMUSCULAR | Status: DC | PRN
  Administered 2019-10-02: 80 mg via INTRAVENOUS
  Administered 2019-10-02: 20 mg via INTRAVENOUS

## 2019-10-02 MED ORDER — MIDAZOLAM HCL 2 MG/2ML IJ SOLN
1.0000 mg | INTRAMUSCULAR | Status: DC
  Administered 2019-10-02: 1 mg via INTRAVENOUS
  Filled 2019-10-02: qty 2

## 2019-10-02 MED ORDER — ACETAMINOPHEN 10 MG/ML IV SOLN
1000.0000 mg | Freq: Once | INTRAVENOUS | Status: AC
  Administered 2019-10-02: 1000 mg via INTRAVENOUS

## 2019-10-02 MED ORDER — FENTANYL CITRATE (PF) 100 MCG/2ML IJ SOLN: 25.0000 ug | INTRAMUSCULAR | Status: DC | PRN

## 2019-10-02 MED ORDER — ONDANSETRON HCL 4 MG/2ML IJ SOLN
INTRAMUSCULAR | Status: DC | PRN
  Administered 2019-10-02: 4 mg via INTRAVENOUS

## 2019-10-02 MED ORDER — SUGAMMADEX SODIUM 500 MG/5ML IV SOLN
INTRAVENOUS | Status: AC
  Filled 2019-10-02: qty 5

## 2019-10-02 MED ORDER — ACETAMINOPHEN 500 MG PO TABS
1000.0000 mg | ORAL_TABLET | Freq: Three times a day (TID) | ORAL | 0 refills | Status: AC
Start: 2019-10-02 — End: 2019-10-16

## 2019-10-02 MED ORDER — PHENYLEPHRINE HCL-NACL 10-0.9 MG/250ML-% IV SOLN
INTRAVENOUS | Status: DC | PRN
Start: 2019-10-02 — End: 2019-10-02
  Administered 2019-10-02: 20 ug/min via INTRAVENOUS

## 2019-10-02 MED ORDER — PROPOFOL 10 MG/ML IV BOLUS
INTRAVENOUS | Status: DC | PRN
  Administered 2019-10-02: 200 mg via INTRAVENOUS

## 2019-10-02 MED ORDER — ROCURONIUM BROMIDE 10 MG/ML (PF) SYRINGE
PREFILLED_SYRINGE | INTRAVENOUS | Status: AC
  Filled 2019-10-02: qty 10

## 2019-10-02 MED ORDER — LABETALOL HCL 5 MG/ML IV SOLN
INTRAVENOUS | Status: AC
  Filled 2019-10-02: qty 4

## 2019-10-02 MED ORDER — CLINDAMYCIN PHOSPHATE 900 MG/50ML IV SOLN
900.0000 mg | INTRAVENOUS | Status: AC
  Administered 2019-10-02: 900 mg via INTRAVENOUS
  Filled 2019-10-02: qty 50

## 2019-10-02 MED ORDER — SODIUM CHLORIDE 0.9 % IR SOLN
Status: DC | PRN
  Administered 2019-10-02: 6000 mL

## 2019-10-02 MED ORDER — CHLORHEXIDINE GLUCONATE 0.12 % MT SOLN
15.0000 mL | Freq: Once | OROMUCOSAL | Status: AC
  Administered 2019-10-02: 15 mL via OROMUCOSAL

## 2019-10-02 MED ORDER — DICLOFENAC SODIUM 75 MG PO TBEC: 75.0000 mg | DELAYED_RELEASE_TABLET | Freq: Two times a day (BID) | ORAL | 0 refills | Status: AC

## 2019-10-02 MED ORDER — ONDANSETRON HCL 4 MG/2ML IJ SOLN
4.0000 mg | Freq: Once | INTRAMUSCULAR | Status: AC | PRN
  Administered 2019-10-02: 4 mg via INTRAVENOUS

## 2019-10-02 MED ORDER — DEXAMETHASONE SODIUM PHOSPHATE 10 MG/ML IJ SOLN
INTRAMUSCULAR | Status: DC | PRN
  Administered 2019-10-02: 4 mg via INTRAVENOUS

## 2019-10-02 MED ORDER — ACETAMINOPHEN 10 MG/ML IV SOLN
INTRAVENOUS | Status: AC
  Filled 2019-10-02: qty 100

## 2019-10-02 MED ORDER — FENTANYL CITRATE (PF) 100 MCG/2ML IJ SOLN
INTRAMUSCULAR | Status: AC
  Filled 2019-10-02: qty 2

## 2019-10-02 MED ORDER — BUPIVACAINE LIPOSOME 1.3 % IJ SUSP
INTRAMUSCULAR | Status: DC | PRN
Start: 2019-10-02 — End: 2019-10-02
  Administered 2019-10-02: 10 mL via PERINEURAL

## 2019-10-02 MED ORDER — LIDOCAINE 2% (20 MG/ML) 5 ML SYRINGE
INTRAMUSCULAR | Status: AC
  Filled 2019-10-02: qty 5

## 2019-10-02 MED ORDER — RIVAROXABAN 10 MG PO TABS
10.0000 mg | ORAL_TABLET | Freq: Every day | ORAL | 0 refills | Status: DC
Start: 2019-10-02 — End: 2020-11-25

## 2019-10-02 MED ORDER — METHOCARBAMOL 500 MG PO TABS: 500.0000 mg | ORAL_TABLET | Freq: Three times a day (TID) | ORAL | 0 refills | Status: DC | PRN

## 2019-10-02 MED ORDER — EPINEPHRINE PF 1 MG/ML IJ SOLN
INTRAMUSCULAR | Status: AC
  Filled 2019-10-02: qty 4

## 2019-10-02 MED ORDER — PHENYLEPHRINE HCL (PRESSORS) 10 MG/ML IV SOLN
INTRAVENOUS | Status: AC
  Filled 2019-10-02: qty 1

## 2019-10-02 MED ORDER — ONDANSETRON HCL 4 MG/2ML IJ SOLN
INTRAMUSCULAR | Status: AC
  Filled 2019-10-02: qty 2

## 2019-10-02 MED ORDER — DEXAMETHASONE SODIUM PHOSPHATE 10 MG/ML IJ SOLN
INTRAMUSCULAR | Status: AC
  Filled 2019-10-02: qty 1

## 2019-10-02 MED ORDER — BUPIVACAINE-EPINEPHRINE (PF) 0.5% -1:200000 IJ SOLN
INTRAMUSCULAR | Status: DC | PRN
  Administered 2019-10-02: 15 mL via PERINEURAL

## 2019-10-02 MED ORDER — ONDANSETRON HCL 4 MG PO TABS: 4.0000 mg | ORAL_TABLET | Freq: Three times a day (TID) | ORAL | 1 refills | Status: AC | PRN

## 2019-10-02 MED ORDER — SUCCINYLCHOLINE CHLORIDE 200 MG/10ML IV SOSY
PREFILLED_SYRINGE | INTRAVENOUS | Status: DC | PRN
  Administered 2019-10-02: 140 mg via INTRAVENOUS

## 2019-10-02 MED ORDER — OXYCODONE HCL 5 MG PO TABS: ORAL_TABLET | ORAL | 0 refills | Status: AC

## 2019-10-02 MED ORDER — LACTATED RINGERS IV SOLN: INTRAVENOUS | Status: DC | PRN

## 2019-10-02 MED ORDER — LACTATED RINGERS IV SOLN: INTRAVENOUS | Status: DC

## 2019-10-02 MED ORDER — ONDANSETRON HCL 4 MG/2ML IJ SOLN
4.0000 mg | Freq: Once | INTRAMUSCULAR | Status: AC
  Administered 2019-10-02: 4 mg via INTRAVENOUS

## 2019-10-02 MED ORDER — ROCURONIUM BROMIDE 10 MG/ML (PF) SYRINGE
PREFILLED_SYRINGE | INTRAVENOUS | Status: DC | PRN
  Administered 2019-10-02: 20 mg via INTRAVENOUS
  Administered 2019-10-02: 60 mg via INTRAVENOUS

## 2019-10-02 MED ORDER — FENTANYL CITRATE (PF) 100 MCG/2ML IJ SOLN
50.0000 ug | INTRAMUSCULAR | Status: DC
  Administered 2019-10-02 (×3): 50 ug via INTRAVENOUS
  Filled 2019-10-02: qty 2

## 2019-10-02 SURGICAL SUPPLY — 78 items
BENZOIN TINCTURE PRP APPL 2/3 (GAUZE/BANDAGES/DRESSINGS) IMPLANT
BLADE EXCALIBUR 4.0MM X 13CM (MISCELLANEOUS) ×1
BLADE EXCALIBUR 4.0X13 (MISCELLANEOUS) ×2 IMPLANT
BLADE SURG 15 STRL LF DISP TIS (BLADE) IMPLANT
BLADE SURG 15 STRL SS (BLADE)
BURR OVAL 8 FLU 4.0MM X 13CM (MISCELLANEOUS)
BURR OVAL 8 FLU 4.0X13 (MISCELLANEOUS) IMPLANT
BURR OVAL 8 FLU 5.0MM X 13CM (MISCELLANEOUS) ×1
BURR OVAL 8 FLU 5.0X13 (MISCELLANEOUS) ×1 IMPLANT
CANNULA TWIST IN 8.25X7CM (CANNULA) IMPLANT
CLOSURE STERI-STRIP 1/2X4 (GAUZE/BANDAGES/DRESSINGS) ×1
CLOSURE WOUND 1/2 X4 (GAUZE/BANDAGES/DRESSINGS)
CLSR STERI-STRIP ANTIMIC 1/2X4 (GAUZE/BANDAGES/DRESSINGS) ×1 IMPLANT
COVER WAND RF STERILE (DRAPES) IMPLANT
DECANTER SPIKE VIAL GLASS SM (MISCELLANEOUS) IMPLANT
DISSECTOR  3.8MM X 13CM (MISCELLANEOUS)
DISSECTOR 3.8MM X 13CM (MISCELLANEOUS) ×1 IMPLANT
DRAPE IMP U-DRAPE 54X76 (DRAPES) ×3 IMPLANT
DRAPE OEC MINIVIEW 54X84 (DRAPES) IMPLANT
DRAPE ORTHO SPLIT 77X108 STRL (DRAPES) ×6
DRAPE STERI 35X30 U-POUCH (DRAPES) ×3 IMPLANT
DRAPE SURG ORHT 6 SPLT 77X108 (DRAPES) ×2 IMPLANT
DRAPE U-SHAPE 47X51 STRL (DRAPES) ×3 IMPLANT
DRSG PAD ABDOMINAL 8X10 ST (GAUZE/BANDAGES/DRESSINGS) ×7 IMPLANT
DURAPREP 26ML APPLICATOR (WOUND CARE) ×3 IMPLANT
ELECT MENISCUS 165MM 90D (ELECTRODE) ×3 IMPLANT
ELECT REM PT RETURN 15FT ADLT (MISCELLANEOUS) ×3 IMPLANT
GAUZE SPONGE 4X4 12PLY STRL (GAUZE/BANDAGES/DRESSINGS) ×3 IMPLANT
GAUZE XEROFORM 1X8 LF (GAUZE/BANDAGES/DRESSINGS) ×3 IMPLANT
GLOVE BIO SURGEON STRL SZ 6.5 (GLOVE) ×4 IMPLANT
GLOVE BIO SURGEON STRL SZ8 (GLOVE) ×3 IMPLANT
GLOVE BIO SURGEONS STRL SZ 6.5 (GLOVE) ×2
GLOVE BIOGEL PI IND STRL 6.5 (GLOVE) ×1 IMPLANT
GLOVE BIOGEL PI IND STRL 8 (GLOVE) ×2 IMPLANT
GLOVE BIOGEL PI INDICATOR 6.5 (GLOVE) ×2
GLOVE BIOGEL PI INDICATOR 8 (GLOVE) ×4
GLOVE ECLIPSE 8.0 STRL XLNG CF (GLOVE) ×3 IMPLANT
GOWN STRL REUS W/ TWL LRG LVL3 (GOWN DISPOSABLE) ×1 IMPLANT
GOWN STRL REUS W/ TWL XL LVL3 (GOWN DISPOSABLE) ×1 IMPLANT
GOWN STRL REUS W/TWL LRG LVL3 (GOWN DISPOSABLE) ×6 IMPLANT
GOWN STRL REUS W/TWL XL LVL3 (GOWN DISPOSABLE) ×3
IV NS IRRIG 3000ML ARTHROMATIC (IV SOLUTION) ×12 IMPLANT
KIT BASIN (CUSTOM PROCEDURE TRAY) ×3 IMPLANT
KIT STABILIZATION SHOULDER (MISCELLANEOUS) ×3 IMPLANT
MANIFOLD NEPTUNE II (INSTRUMENTS) ×3 IMPLANT
NDL SCORPION MULTI FIRE (NEEDLE) IMPLANT
NDL SUT 6 .5 CRC .975X.05 MAYO (NEEDLE) IMPLANT
NEEDLE MAYO TAPER (NEEDLE)
NEEDLE SCORPION MULTI FIRE (NEEDLE) IMPLANT
PACK DSU ARTHROSCOPY (CUSTOM PROCEDURE TRAY) ×3 IMPLANT
PASSER SUT SWANSON 36MM LOOP (INSTRUMENTS) IMPLANT
PENCIL SMOKE EVACUATOR (MISCELLANEOUS) IMPLANT
PORT APPOLLO RF 90DEGREE MULTI (SURGICAL WAND) ×3 IMPLANT
RESTRAINT HEAD UNIVERSAL NS (MISCELLANEOUS) ×3 IMPLANT
RETRIEVER SUT HEWSON (MISCELLANEOUS) IMPLANT
SLING ARM FOAM STRAP LRG (SOFTGOODS) IMPLANT
SLING ARM FOAM STRAP MED (SOFTGOODS) IMPLANT
SLING ARM FOAM STRAP SML (SOFTGOODS) IMPLANT
SLING ARM FOAM STRAP XLG (SOFTGOODS) ×2 IMPLANT
SLING ARM IMMOBILIZER MED (SOFTGOODS) IMPLANT
SPONGE LAP 4X18 RFD (DISPOSABLE) IMPLANT
STRIP CLOSURE SKIN 1/2X4 (GAUZE/BANDAGES/DRESSINGS) IMPLANT
SUCTION FRAZIER HANDLE 12FR (TUBING)
SUCTION TUBE FRAZIER 12FR DISP (TUBING) IMPLANT
SUT ETHIBOND 2 OS 4 DA (SUTURE) IMPLANT
SUT ETHILON 3 0 PS 1 (SUTURE) IMPLANT
SUT FIBERWIRE #2 38 T-5 BLUE (SUTURE)
SUT TIGER TAPE 7 IN WHITE (SUTURE) IMPLANT
SUT VIC AB 0 CT1 27 (SUTURE)
SUT VIC AB 0 CT1 27XBRD ANBCTR (SUTURE) IMPLANT
SUT VIC AB 3-0 FS2 27 (SUTURE) ×3 IMPLANT
SUTURE FIBERWR #2 38 T-5 BLUE (SUTURE) IMPLANT
TAPE CLOTH SURG 6X10 WHT LF (GAUZE/BANDAGES/DRESSINGS) ×2 IMPLANT
TAPE FIBER 2MM 7IN #2 BLUE (SUTURE) IMPLANT
TOWEL OR 17X26 10 PK STRL BLUE (TOWEL DISPOSABLE) ×3 IMPLANT
TUBING ARTHROSCOPY IRRIG 16FT (MISCELLANEOUS) ×3 IMPLANT
WATER STERILE IRR 1000ML POUR (IV SOLUTION) ×3 IMPLANT
YANKAUER SUCT BULB TIP NO VENT (SUCTIONS) IMPLANT

## 2019-10-02 NOTE — Transfer of Care (Signed)
Immediate Anesthesia Transfer of Care Note  Patient: GWENETH AMPARANO  Procedure(s) Performed: LEFT SHOULDER ARTHROSCOPY WITH SUBACROMIAL DECOMPRESSION AND DISTAL CLAVICLE EXCISION, (Left Shoulder)  Patient Location: PACU  Anesthesia Type:General  Level of Consciousness: drowsy and responds to stimulation  Airway & Oxygen Therapy: Patient Spontanous Breathing and Patient connected to face mask oxygen  Post-op Assessment: Report given to RN and Post -op Vital signs reviewed and stable  Post vital signs: Reviewed and stable  Last Vitals:  Vitals Value Taken Time  BP    Temp    Pulse 93 10/02/19 1423  Resp 26 10/02/19 1423  SpO2 94 % 10/02/19 1423  Vitals shown include unvalidated device data.  Last Pain:  Vitals:   10/02/19 1154  TempSrc:   PainSc: 7       Patients Stated Pain Goal: 4 ([id number])  Complications: No apparent anesthesia complications

## 2019-10-02 NOTE — Anesthesia Postprocedure Evaluation (Signed)
Anesthesia Post Note  Patient: Kathleen Walsh  Procedure(s) Performed: LEFT SHOULDER ARTHROSCOPY WITH SUBACROMIAL DECOMPRESSION AND DISTAL CLAVICLE EXCISION, (Left Shoulder)     Patient location during evaluation: PACU Anesthesia Type: General Level of consciousness: awake and alert Pain management: pain level controlled Vital Signs Assessment: post-procedure vital signs reviewed and stable Respiratory status: spontaneous breathing, nonlabored ventilation, respiratory function stable and patient connected to nasal cannula oxygen Cardiovascular status: blood pressure returned to baseline and stable Postop Assessment: no apparent nausea or vomiting Anesthetic complications: no    Last Vitals:  Vitals:   10/02/19 1615 10/02/19 1645  BP:  132/70  Pulse:    Resp:  18  Temp:  36.9 C  SpO2: 90%     Last Pain:  Vitals:   10/02/19 1645  TempSrc:   PainSc: 7                  Tiajuana Amass

## 2019-10-02 NOTE — Interval H&P Note (Signed)
History and Physical Interval Note:  10/02/2019 12:18 PM  Kathleen Walsh  has presented today for surgery, with the diagnosis of LEFT SHOULDER [place], OSTEARTHRITIS, IMPINGMENT SYNDROME, BICEP TENODITIS.  The various methods of treatment have been discussed with the patient and family. After consideration of risks, benefits and other options for treatment, the patient has consented to  Procedure(s): LEFT SHOULDER ARTHROSCOPY WITH SUBACROMIAL DECOMPRESSION AND DISTAL CLAVICLE EXCISION, BICEP TENODESIS (Left) as a surgical intervention.  The patient's history has been reviewed, patient examined, no change in status, stable for surgery.  I have reviewed the patient's chart and labs.  Questions were answered to the patient's satisfaction.     Hiram Gash

## 2019-10-02 NOTE — Anesthesia Procedure Notes (Signed)
Anesthesia Regional Block: Interscalene brachial plexus block   Pre-Anesthetic Checklist: ,, timeout performed, Correct Patient, Correct Site, Correct Laterality, Correct Procedure, Correct Position, site marked, Risks and benefits discussed,  Surgical consent,  Pre-op evaluation,  At surgeon's request and post-op pain management  Laterality: Left  Prep: chloraprep       Needles:  Injection technique: Single-shot  Needle Type: Echogenic Stimulator Needle     Needle Length: 9cm  Needle Gauge: 21     Additional Needles:   Procedures:, nerve stimulator,,, ultrasound used (permanent image in chart),,,,   Nerve Stimulator or Paresthesia:  Response: deltoid and bicep, 0.5 mA,   Additional Responses:   Narrative:  Start time: 10/02/2019 12:23 PM End time: 10/02/2019 12:30 PM Injection made incrementally with aspirations every 5 mL.  Performed by: Personally  Anesthesiologist: Suzette Battiest, MD

## 2019-10-02 NOTE — Anesthesia Procedure Notes (Signed)
Procedure Name: Intubation Date/Time: 10/02/2019 1:12 PM Performed by: Mitzie Na, CRNA Pre-anesthesia Checklist: Patient identified, Emergency Drugs available, Suction available and Patient being monitored Patient Re-evaluated:Patient Re-evaluated prior to induction Oxygen Delivery Method: Circle system utilized Preoxygenation: Pre-oxygenation with 100% oxygen Induction Type: IV induction Laryngoscope Size: Mac and 4 Grade View: Grade I Tube type: Oral Tube size: 7.5 mm Number of attempts: 1 Airway Equipment and Method: Stylet and Oral airway Placement Confirmation: ETT inserted through vocal cords under direct vision,  positive ETCO2 and breath sounds checked- equal and bilateral Secured at: 23 cm Tube secured with: Tape Dental Injury: Teeth and Oropharynx as per pre-operative assessment  Comments: Intubated by lauren ferm, srna under supervision of md and crna

## 2019-10-02 NOTE — Progress Notes (Signed)
Assisted Dr. Rob Fitzgerald with left, ultrasound guided, interscalene  block. Side rails up, monitors on throughout procedure. See vital signs in flow sheet. Tolerated Procedure well. 

## 2019-10-02 NOTE — Anesthesia Preprocedure Evaluation (Signed)
Anesthesia Evaluation  Patient identified by MRN, date of birth, ID band Patient awake    Reviewed: Allergy & Precautions, NPO status , Patient's Chart, lab work & pertinent test results  Airway Mallampati: III  TM Distance: >3 FB Neck ROM: Full    Dental  (+) Dental Advisory Given   Pulmonary sleep apnea , former smoker,    breath sounds clear to auscultation       Cardiovascular hypertension, Pt. on medications  Rhythm:Regular Rate:Normal     Neuro/Psych  Neuromuscular disease    GI/Hepatic negative GI ROS, Neg liver ROS,   Endo/Other  Morbid obesity  Renal/GU negative Renal ROS     Musculoskeletal  (+) Arthritis ,   Abdominal   Peds  Hematology negative hematology ROS (+)   Anesthesia Other Findings   Reproductive/Obstetrics                             Lab Results  Component Value Date   WBC 13.4 (H) 09/25/2019   HGB 14.5 09/25/2019   HCT 46.9 (H) 09/25/2019   MCV 84.8 09/25/2019   PLT 270 09/25/2019   Lab Results  Component Value Date   CREATININE 0.88 06/04/2019   BUN 15 06/04/2019   NA 141 06/04/2019   K 4.2 06/04/2019   CL 107 06/04/2019   CO2 24 06/04/2019    Anesthesia Physical Anesthesia Plan  ASA: III  Anesthesia Plan: General   Post-op Pain Management:  Regional for Post-op pain   Induction: Intravenous  PONV Risk Score and Plan: 3 and Dexamethasone, Ondansetron and Treatment may vary due to age or medical condition  Airway Management Planned: Oral ETT  Additional Equipment: None  Intra-op Plan:   Post-operative Plan: Extubation in OR  Informed Consent: I have reviewed the patients History and Physical, chart, labs and discussed the procedure including the risks, benefits and alternatives for the proposed anesthesia with the patient or authorized representative who has indicated his/her understanding and acceptance.     Dental advisory  given  Plan Discussed with: CRNA  Anesthesia Plan Comments:         Anesthesia Quick Evaluation

## 2019-10-03 NOTE — Op Note (Signed)
Orthopaedic Surgery Operative Note ([name]: [id number])  Kathleen Walsh  1980/10/05 Date of Surgery: 10/02/2019   Diagnoses:  Left shoulder impingement and AC arthrosis with biceps tendinitis and SLAP tear  Procedure: Arthroscopic extensive debridement Arthroscopic subacromial decompression Arthroscopic distal clavicle excision   Operative Finding Exam under anesthesia: Full motion no limitation Articular space: No loose bodies, capsule intact, anterior labral fraying that was mild, SLAP tear, Chondral surfaces:Intact, no sign of chondral degeneration on the glenoid or humeral head Biceps: SLAP tear noted, biceps was relatively otherwise preserved, tenotomy performed Subscapularis: Normal Superior Cuff: Normal Bursal side: Normal  Successful completion of the planned procedure.  Patient's shoulder overall is in relatively good shape with the exception of the SLAP tear and the above-mentioned findings.  Cuff is intact.  1 week in a sling.   Post-operative plan: The patient will be non-weightbearing in a sling for 1 week.  The patient will be discharged home.  DVT prophylaxis not indicated in ambulatory upper extremity patient without known risk factors.   Pain control with PRN pain medication preferring oral medicines.  Follow up plan will be scheduled in approximately 7 days for incision check and XR.  Post-Op Diagnosis: Same Surgeons:Primary: Hiram Gash, MD Assistants:Caroline McBane PA-C Location: [facility] ROOM 06 Anesthesia: General with Exparel interscalene block Antibiotics: Ancef 3 g Tourniquet time: None Estimated Blood Loss: Minimal Complications: None Specimens: None Implants: * No implants in log *  Indications for Surgery:   Kathleen Walsh is a 39 y.o. female with continued shoulder pain refractory to nonoperative measures for extended period of time.    The risks and benefits were explained at length including but not limited to continued pain, cuff failure, biceps  tenodesis failure, stiffness, need for further surgery and infection.   Procedure:   Patient was correctly identified in the preoperative holding area and operative site marked.  Patient brought to OR and positioned beachchair on an [place] table ensuring that all bony prominences were padded and the head was in an appropriate location.  Anesthesia was induced and the operative shoulder was prepped and draped in the usual sterile fashion.  Timeout was called preincision.  A standard posterior viewing portal was made after localizing the portal with a spinal needle.  An anterior accessory portal was also made.  After clearing the articular space the camera was positioned in the subacromial space.  Findings above.    Extensive debridement was performed of the anterior interval tissue, labral fraying and the bursa.  Biceps tenotomy was performed and we debrided back the stump.  Subacromial decompression: We made a lateral portal with spinal needle guidance. We then proceeded to debride bursal tissue extensively with a shaver and arthrocare device. At that point we continued to identify the borders of the acromion and identify the spur. We then carefully preserved the deltoid fascia and used a burr to convert the acromion to a Type 1 flat acromion without issue.  Distal Clavicle resection:  The scope was placed in the subacromial space from the posterior portal.  A hemostat was placed through the anterior portal and we spread at the [facility] joint.  A burr was then inserted and 10 mm of distal clavicle was resected taking care to avoid damage to the capsule around the joint and avoiding overhanging bone posteriorly.    The incisions were closed with absorbable monocryl and steri strips.  A sterile dressing was placed along with a sling. The patient was awoken from general anesthesia and taken  to the PACU in stable condition without complication.   Noemi Chapel, PA-C, present and scrubbed throughout the case,  critical for completion in a timely fashion, and for retraction, instrumentation, closure.

## 2019-10-11 DIAGNOSIS — M19012 Primary osteoarthritis, left shoulder: Secondary | ICD-10-CM | POA: Diagnosis not present

## 2019-10-17 DIAGNOSIS — M25612 Stiffness of left shoulder, not elsewhere classified: Secondary | ICD-10-CM | POA: Diagnosis not present

## 2019-10-17 DIAGNOSIS — M25512 Pain in left shoulder: Secondary | ICD-10-CM | POA: Diagnosis not present

## 2019-10-17 DIAGNOSIS — M6281 Muscle weakness (generalized): Secondary | ICD-10-CM | POA: Diagnosis not present

## 2019-10-17 DIAGNOSIS — M7542 Impingement syndrome of left shoulder: Secondary | ICD-10-CM | POA: Diagnosis not present

## 2019-10-24 DIAGNOSIS — M25612 Stiffness of left shoulder, not elsewhere classified: Secondary | ICD-10-CM | POA: Diagnosis not present

## 2019-10-24 DIAGNOSIS — M25512 Pain in left shoulder: Secondary | ICD-10-CM | POA: Diagnosis not present

## 2019-10-24 DIAGNOSIS — M7542 Impingement syndrome of left shoulder: Secondary | ICD-10-CM | POA: Diagnosis not present

## 2019-10-24 DIAGNOSIS — M6281 Muscle weakness (generalized): Secondary | ICD-10-CM | POA: Diagnosis not present

## 2019-10-28 DIAGNOSIS — M6281 Muscle weakness (generalized): Secondary | ICD-10-CM | POA: Diagnosis not present

## 2019-10-28 DIAGNOSIS — M7542 Impingement syndrome of left shoulder: Secondary | ICD-10-CM | POA: Diagnosis not present

## 2019-10-28 DIAGNOSIS — M25512 Pain in left shoulder: Secondary | ICD-10-CM | POA: Diagnosis not present

## 2019-10-28 DIAGNOSIS — G4733 Obstructive sleep apnea (adult) (pediatric): Secondary | ICD-10-CM | POA: Diagnosis not present

## 2019-10-28 DIAGNOSIS — M25612 Stiffness of left shoulder, not elsewhere classified: Secondary | ICD-10-CM | POA: Diagnosis not present

## 2019-11-05 DIAGNOSIS — M25512 Pain in left shoulder: Secondary | ICD-10-CM | POA: Diagnosis not present

## 2019-11-07 DIAGNOSIS — M7542 Impingement syndrome of left shoulder: Secondary | ICD-10-CM | POA: Diagnosis not present

## 2019-11-07 DIAGNOSIS — M25612 Stiffness of left shoulder, not elsewhere classified: Secondary | ICD-10-CM | POA: Diagnosis not present

## 2019-11-07 DIAGNOSIS — M6281 Muscle weakness (generalized): Secondary | ICD-10-CM | POA: Diagnosis not present

## 2019-11-07 DIAGNOSIS — M25512 Pain in left shoulder: Secondary | ICD-10-CM | POA: Diagnosis not present

## 2019-11-13 DIAGNOSIS — M25612 Stiffness of left shoulder, not elsewhere classified: Secondary | ICD-10-CM | POA: Diagnosis not present

## 2019-11-13 DIAGNOSIS — M7542 Impingement syndrome of left shoulder: Secondary | ICD-10-CM | POA: Diagnosis not present

## 2019-11-13 DIAGNOSIS — M6281 Muscle weakness (generalized): Secondary | ICD-10-CM | POA: Diagnosis not present

## 2019-11-13 DIAGNOSIS — M25512 Pain in left shoulder: Secondary | ICD-10-CM | POA: Diagnosis not present

## 2019-11-14 DIAGNOSIS — M6281 Muscle weakness (generalized): Secondary | ICD-10-CM | POA: Diagnosis not present

## 2019-11-14 DIAGNOSIS — M7542 Impingement syndrome of left shoulder: Secondary | ICD-10-CM | POA: Diagnosis not present

## 2019-11-14 DIAGNOSIS — M25612 Stiffness of left shoulder, not elsewhere classified: Secondary | ICD-10-CM | POA: Diagnosis not present

## 2019-11-14 DIAGNOSIS — M25512 Pain in left shoulder: Secondary | ICD-10-CM | POA: Diagnosis not present

## 2019-11-19 DIAGNOSIS — M19012 Primary osteoarthritis, left shoulder: Secondary | ICD-10-CM | POA: Diagnosis not present

## 2019-11-19 DIAGNOSIS — M25612 Stiffness of left shoulder, not elsewhere classified: Secondary | ICD-10-CM | POA: Diagnosis not present

## 2019-11-19 DIAGNOSIS — M7522 Bicipital tendinitis, left shoulder: Secondary | ICD-10-CM | POA: Diagnosis not present

## 2019-11-19 DIAGNOSIS — M7542 Impingement syndrome of left shoulder: Secondary | ICD-10-CM | POA: Diagnosis not present

## 2019-11-26 DIAGNOSIS — M6281 Muscle weakness (generalized): Secondary | ICD-10-CM | POA: Diagnosis not present

## 2019-11-26 DIAGNOSIS — M19012 Primary osteoarthritis, left shoulder: Secondary | ICD-10-CM | POA: Diagnosis not present

## 2019-11-26 DIAGNOSIS — M25612 Stiffness of left shoulder, not elsewhere classified: Secondary | ICD-10-CM | POA: Diagnosis not present

## 2019-11-26 DIAGNOSIS — M7542 Impingement syndrome of left shoulder: Secondary | ICD-10-CM | POA: Diagnosis not present

## 2019-11-27 DIAGNOSIS — G4733 Obstructive sleep apnea (adult) (pediatric): Secondary | ICD-10-CM | POA: Diagnosis not present

## 2019-11-28 DIAGNOSIS — M6281 Muscle weakness (generalized): Secondary | ICD-10-CM | POA: Diagnosis not present

## 2019-11-28 DIAGNOSIS — M25612 Stiffness of left shoulder, not elsewhere classified: Secondary | ICD-10-CM | POA: Diagnosis not present

## 2019-11-28 DIAGNOSIS — M7542 Impingement syndrome of left shoulder: Secondary | ICD-10-CM | POA: Diagnosis not present

## 2019-11-28 DIAGNOSIS — M25512 Pain in left shoulder: Secondary | ICD-10-CM | POA: Diagnosis not present

## 2019-12-03 DIAGNOSIS — M7542 Impingement syndrome of left shoulder: Secondary | ICD-10-CM | POA: Diagnosis not present

## 2019-12-03 DIAGNOSIS — M19012 Primary osteoarthritis, left shoulder: Secondary | ICD-10-CM | POA: Diagnosis not present

## 2019-12-03 DIAGNOSIS — M7522 Bicipital tendinitis, left shoulder: Secondary | ICD-10-CM | POA: Diagnosis not present

## 2019-12-03 DIAGNOSIS — M6281 Muscle weakness (generalized): Secondary | ICD-10-CM | POA: Diagnosis not present

## 2019-12-10 DIAGNOSIS — M7542 Impingement syndrome of left shoulder: Secondary | ICD-10-CM | POA: Diagnosis not present

## 2019-12-10 DIAGNOSIS — M6281 Muscle weakness (generalized): Secondary | ICD-10-CM | POA: Diagnosis not present

## 2019-12-10 DIAGNOSIS — M25512 Pain in left shoulder: Secondary | ICD-10-CM | POA: Diagnosis not present

## 2019-12-10 DIAGNOSIS — M25612 Stiffness of left shoulder, not elsewhere classified: Secondary | ICD-10-CM | POA: Diagnosis not present

## 2019-12-17 DIAGNOSIS — M7542 Impingement syndrome of left shoulder: Secondary | ICD-10-CM | POA: Diagnosis not present

## 2019-12-17 DIAGNOSIS — M6281 Muscle weakness (generalized): Secondary | ICD-10-CM | POA: Diagnosis not present

## 2019-12-17 DIAGNOSIS — M19012 Primary osteoarthritis, left shoulder: Secondary | ICD-10-CM | POA: Diagnosis not present

## 2019-12-17 DIAGNOSIS — M7522 Bicipital tendinitis, left shoulder: Secondary | ICD-10-CM | POA: Diagnosis not present

## 2019-12-25 DIAGNOSIS — G43009 Migraine without aura, not intractable, without status migrainosus: Secondary | ICD-10-CM | POA: Diagnosis not present

## 2019-12-25 DIAGNOSIS — R600 Localized edema: Secondary | ICD-10-CM | POA: Diagnosis not present

## 2019-12-25 DIAGNOSIS — E119 Type 2 diabetes mellitus without complications: Secondary | ICD-10-CM | POA: Diagnosis not present

## 2019-12-25 DIAGNOSIS — M79606 Pain in leg, unspecified: Secondary | ICD-10-CM | POA: Diagnosis not present

## 2019-12-25 DIAGNOSIS — R42 Dizziness and giddiness: Secondary | ICD-10-CM | POA: Diagnosis not present

## 2019-12-28 DIAGNOSIS — G4733 Obstructive sleep apnea (adult) (pediatric): Secondary | ICD-10-CM | POA: Diagnosis not present

## 2019-12-30 DIAGNOSIS — M25612 Stiffness of left shoulder, not elsewhere classified: Secondary | ICD-10-CM | POA: Diagnosis not present

## 2019-12-30 DIAGNOSIS — M6281 Muscle weakness (generalized): Secondary | ICD-10-CM | POA: Diagnosis not present

## 2019-12-30 DIAGNOSIS — M25512 Pain in left shoulder: Secondary | ICD-10-CM | POA: Diagnosis not present

## 2019-12-30 DIAGNOSIS — M7542 Impingement syndrome of left shoulder: Secondary | ICD-10-CM | POA: Diagnosis not present

## 2020-01-07 DIAGNOSIS — M25612 Stiffness of left shoulder, not elsewhere classified: Secondary | ICD-10-CM | POA: Diagnosis not present

## 2020-01-07 DIAGNOSIS — M6281 Muscle weakness (generalized): Secondary | ICD-10-CM | POA: Diagnosis not present

## 2020-01-07 DIAGNOSIS — M25512 Pain in left shoulder: Secondary | ICD-10-CM | POA: Diagnosis not present

## 2020-01-07 DIAGNOSIS — M7542 Impingement syndrome of left shoulder: Secondary | ICD-10-CM | POA: Diagnosis not present

## 2020-01-09 DIAGNOSIS — M25512 Pain in left shoulder: Secondary | ICD-10-CM | POA: Diagnosis not present

## 2020-01-10 DIAGNOSIS — D72829 Elevated white blood cell count, unspecified: Secondary | ICD-10-CM | POA: Diagnosis not present

## 2020-01-13 DIAGNOSIS — U071 COVID-19: Secondary | ICD-10-CM | POA: Diagnosis not present

## 2020-01-21 DIAGNOSIS — M6281 Muscle weakness (generalized): Secondary | ICD-10-CM | POA: Diagnosis not present

## 2020-01-21 DIAGNOSIS — M25512 Pain in left shoulder: Secondary | ICD-10-CM | POA: Diagnosis not present

## 2020-01-21 DIAGNOSIS — M25612 Stiffness of left shoulder, not elsewhere classified: Secondary | ICD-10-CM | POA: Diagnosis not present

## 2020-01-21 DIAGNOSIS — M7542 Impingement syndrome of left shoulder: Secondary | ICD-10-CM | POA: Diagnosis not present

## 2020-01-28 DIAGNOSIS — G4733 Obstructive sleep apnea (adult) (pediatric): Secondary | ICD-10-CM | POA: Diagnosis not present

## 2020-02-19 DIAGNOSIS — I10 Essential (primary) hypertension: Secondary | ICD-10-CM | POA: Diagnosis not present

## 2020-02-19 DIAGNOSIS — G43009 Migraine without aura, not intractable, without status migrainosus: Secondary | ICD-10-CM | POA: Diagnosis not present

## 2020-02-19 DIAGNOSIS — F418 Other specified anxiety disorders: Secondary | ICD-10-CM | POA: Diagnosis not present

## 2020-02-19 DIAGNOSIS — R110 Nausea: Secondary | ICD-10-CM | POA: Diagnosis not present

## 2020-03-04 DIAGNOSIS — Z1152 Encounter for screening for COVID-19: Secondary | ICD-10-CM | POA: Diagnosis not present

## 2020-03-04 DIAGNOSIS — B349 Viral infection, unspecified: Secondary | ICD-10-CM | POA: Diagnosis not present

## 2020-03-14 ENCOUNTER — Emergency Department (HOSPITAL_COMMUNITY): Payer: BC Managed Care – PPO

## 2020-03-14 ENCOUNTER — Other Ambulatory Visit: Payer: Self-pay

## 2020-03-14 ENCOUNTER — Emergency Department (HOSPITAL_COMMUNITY)
Admission: EM | Admit: 2020-03-14 | Discharge: 2020-03-15 | Disposition: A | Discharge status: Home / Self Care | Payer: BC Managed Care – PPO | Attending: Emergency Medicine | Admitting: Emergency Medicine

## 2020-03-14 DIAGNOSIS — Z87891 Personal history of nicotine dependence: Secondary | ICD-10-CM | POA: Diagnosis not present

## 2020-03-14 DIAGNOSIS — M79601 Pain in right arm: Secondary | ICD-10-CM | POA: Insufficient documentation

## 2020-03-14 DIAGNOSIS — R110 Nausea: Secondary | ICD-10-CM | POA: Insufficient documentation

## 2020-03-14 DIAGNOSIS — I10 Essential (primary) hypertension: Secondary | ICD-10-CM | POA: Diagnosis not present

## 2020-03-14 DIAGNOSIS — R0602 Shortness of breath: Secondary | ICD-10-CM | POA: Diagnosis not present

## 2020-03-14 DIAGNOSIS — R202 Paresthesia of skin: Secondary | ICD-10-CM | POA: Diagnosis not present

## 2020-03-14 DIAGNOSIS — R0789 Other chest pain: Secondary | ICD-10-CM | POA: Diagnosis not present

## 2020-03-14 DIAGNOSIS — I517 Cardiomegaly: Secondary | ICD-10-CM | POA: Diagnosis not present

## 2020-03-14 DIAGNOSIS — R079 Chest pain, unspecified: Secondary | ICD-10-CM

## 2020-03-14 DIAGNOSIS — J9811 Atelectasis: Secondary | ICD-10-CM | POA: Diagnosis not present

## 2020-03-14 DIAGNOSIS — J90 Pleural effusion, not elsewhere classified: Secondary | ICD-10-CM | POA: Diagnosis not present

## 2020-03-14 LAB — CBC
HCT: 41.9 % (ref 36.0–46.0)
Hemoglobin: 12.7 g/dL (ref 12.0–15.0)
MCH: 25.7 pg — ABNORMAL LOW (ref 26.0–34.0)
MCHC: 30.3 g/dL (ref 30.0–36.0)
MCV: 84.8 fL (ref 80.0–100.0)
Platelets: 240 10*3/uL (ref 150–400)
RBC: 4.94 MIL/uL (ref 3.87–5.11)
RDW: 14.8 % (ref 11.5–15.5)
WBC: 13 10*3/uL — ABNORMAL HIGH (ref 4.0–10.5)
nRBC: 0 % (ref 0.0–0.2)

## 2020-03-14 LAB — TROPONIN I (HIGH SENSITIVITY): Troponin I (High Sensitivity): 3 ng/L (ref <18)

## 2020-03-14 LAB — BASIC METABOLIC PANEL WITH GFR
Anion gap: 12 (ref 5–15)
BUN: 13 mg/dL (ref 6–20)
CO2: 22 mmol/L (ref 22–32)
Calcium: 8.5 mg/dL — ABNORMAL LOW (ref 8.9–10.3)
Chloride: 103 mmol/L (ref 98–111)
Creatinine, Ser: 0.64 mg/dL (ref 0.44–1.00)
GFR, Estimated: >60 mL/min
Glucose, Bld: 151 mg/dL — ABNORMAL HIGH (ref 70–99)
Potassium: 3.8 mmol/L (ref 3.5–5.1)
Sodium: 137 mmol/L (ref 135–145)

## 2020-03-14 LAB — D-DIMER, QUANTITATIVE: D-Dimer, Quant: <0.27 ug{FEU}/mL (ref 0.00–0.50)

## 2020-03-14 MED ORDER — HYDROMORPHONE HCL 1 MG/ML IJ SOLN
1.0000 mg | Freq: Once | INTRAMUSCULAR | Status: AC
  Administered 2020-03-14: 1 mg via INTRAVENOUS
  Filled 2020-03-14: qty 1

## 2020-03-14 NOTE — ED Provider Notes (Signed)
[facility] Emergency Department Provider Note MRN:  [id number]  Arrival date & time: 03/14/20     Chief Complaint   Chest Pain   History of Present Illness   Kathleen Walsh is a 39 y.o. year-old female with a history of lupus anticoagulant disorder presenting to the ED with chief complaint of chest pain.  Central chest pressure that woke her from sleep this evening, located in the center of the chest, radiating to the right arm, described as a dull pressure.  Worse with breathing.  Associated with mild nausea, no vomiting, no shortness of breath, no abdominal pain, no other complaints.  Symptoms are currently 9 out of 10 in severity, constant, no exacerbating or alleviating factors.  Review of Systems  A complete 10 system review of systems was obtained and all systems are negative except as noted in the HPI and PMH.   Patient's Health History    Past Medical History:  Diagnosis Date  . Abdominal wall pain   . Anemia   . Anxiety   . Arthritis    left ankle  . Blood dyscrasia    lupus anticoagulant during pregnancy  . Depression    takes cymbalta   . Headache(784.0)    migraines  . Hypertension   . Pre-diabetes   . Sleep apnea    USES C-PAP    Past Surgical History:  Procedure Laterality Date  . ABDOMINAL HYSTERECTOMY    . ANKLE ARTHROSCOPY WITH REPAIR SUBLUXING TENDON Left   . CESAREAN SECTION WITH BILATERAL TUBAL LIGATION Bilateral 02/11/2013   Procedure: Repeat CESAREAN SECTION WITH BILATERAL TUBAL LIGATION;  Surgeon: Lovenia Kim, MD;  Location: [facility];  Service: Obstetrics;  Laterality: Bilateral;  EDD: 03/01/13  . DEBRIDEMENT OF ABDOMINAL WALL ABSCESS N/A 12/03/2014   Procedure: INCISION OF ABDOMINAL WALL LIPOMA;  Surgeon: Greer Pickerel, MD;  Location: [facility];  Service: General;  Laterality: N/A;  . DILATION AND CURETTAGE OF UTERUS  2004  . DILITATION & CURRETTAGE/HYSTROSCOPY WITH NOVASURE ABLATION N/A 11/19/2014   Procedure: DILATATION &  CURETTAGE/HYSTEROSCOPY WITH NOVASURE ABLATION;  Surgeon: Brien Few, MD;  Location: [facility];  Service: Gynecology;  Laterality: N/A;  . ENDOMETRIAL ABLATION  10/20/14  . FOOT SURGERY  2008  . HERNIA REPAIR  09/19/2011  . LAPAROSCOPY N/A 12/03/2014   Procedure: LAPAROSCOPY DIAGNOSTIC WITH LYSIS OF ADHESIONS;  Surgeon: Greer Pickerel, MD;  Location: [facility];  Service: General;  Laterality: N/A;  . NASAL SEPTUM SURGERY    . TUBAL LIGATION    . VENTRAL HERNIA REPAIR  09/20/10   Laparoscopic, Dr Greer Pickerel  . WISDOM TOOTH EXTRACTION      Family History  Problem Relation Age of Onset  . Diabetes Mother   . Hypertension Mother   . Diabetes Father   . Hypertension Father     Social History   Socioeconomic History  . Marital status: Married    Spouse name: Not on file  . Number of children: Not on file  . Years of education: Not on file  . Highest education level: Not on file  Occupational History  . Not on file  Tobacco Use  . Smoking status: Former Smoker    Packs/day: 0.75    Years: 23.00    Pack years: 17.25    Types: Cigarettes    Start date: 05/2017  . Smokeless tobacco: Never Used  Vaping Use  . Vaping Use: Never used  Substance and Sexual Activity  . Alcohol use: No  Comment: "once every three years"   . Drug use: No  . Sexual activity: Yes    Birth control/protection: Surgical  Other Topics Concern  . Not on file  Social History Narrative  . Not on file   Social Determinants of Health   Financial Resource Strain:   . Difficulty of Paying Living Expenses: Not on file  Food Insecurity:   . Worried About Charity fundraiser in the Last Year: Not on file  . Ran Out of Food in the Last Year: Not on file  Transportation Needs:   . Lack of Transportation (Medical): Not on file  . Lack of Transportation (Non-Medical): Not on file  Physical Activity:   . Days of Exercise per Week: Not on file  . Minutes of Exercise per Session: Not on file  Stress:   . Feeling of  Stress : Not on file  Social Connections:   . Frequency of Communication with Friends and Family: Not on file  . Frequency of Social Gatherings with Friends and Family: Not on file  . Attends Religious Services: Not on file  . Active Member of Clubs or Organizations: Not on file  . Attends Archivist Meetings: Not on file  . Marital Status: Not on file  Intimate Partner Violence:   . Fear of Current or Ex-Partner: Not on file  . Emotionally Abused: Not on file  . Physically Abused: Not on file  . Sexually Abused: Not on file     Physical Exam  There were no vitals filed for this visit.  CONSTITUTIONAL: Well-appearing, obese, NAD NEURO:  Alert and oriented x 3, no focal deficits EYES:  eyes equal and reactive ENT/NECK:  no LAD, no JVD CARDIO: Regular rate, well-perfused, normal S1 and S2 PULM:  CTAB no wheezing or rhonchi GI/GU:  normal bowel sounds, non-distended, non-tender MSK/SPINE:  No gross deformities, no edema SKIN:  no rash, atraumatic PSYCH:  Appropriate speech and behavior  *Additional and/or pertinent findings included in MDM below  Diagnostic and Interventional Summary    EKG Interpretation  Date/Time:  Saturday March 14 2020 22:12:46 EST Ventricular Rate:  94 PR Interval:    QRS Duration: 100 QT Interval:  381 QTC Calculation: 477 R Axis:   61 Text Interpretation: Sinus rhythm Minimal ST depression, inferior leads Confirmed by Gerlene Fee [phone]) on 03/14/2020 10:38:24 PM      Labs Reviewed  CBC - Abnormal; Notable for the following components:      Result Value   WBC 13.0 (*)    MCH 25.7 (*)    All other components within normal limits  BASIC METABOLIC PANEL  D-DIMER, QUANTITATIVE (NOT AT [facility])  TROPONIN I (HIGH SENSITIVITY)    DG Chest 2 View    (Results Pending)    Medications  HYDROmorphone (DILAUDID) injection 1 mg (has no administration in time range)     Procedures  /  Critical Care Procedures  ED Course and Medical  Decision Making  I have reviewed the triage vital signs, the nursing notes, and pertinent available records from the EMR.  Listed above are laboratory and imaging tests that I personally ordered, reviewed, and interpreted and then considered in my medical decision making (see below for details).  History of obesity, lupus anticoagulant here with chest pain that is in some aspects typical but also atypical with pleuritic nature.  Considering ACS versus PE, could also be explained by GERD.  Will need troponin x2, also obtaining D-dimer.  Overall patient's  heart score is 2, with negative work-up PA candidate for discharge.  Signed out to oncoming provider shift change.       Barth Kirks. Sedonia Small, MD [place] [email or web address]  Final Clinical Impressions(s) / ED Diagnoses     ICD-10-CM   1. Chest pain, unspecified type  R07.9     ED Discharge Orders    None       Discharge Instructions Discussed with and Provided to Patient:   Discharge Instructions   None       Maudie Flakes, MD 03/14/20 2241

## 2020-03-14 NOTE — ED Triage Notes (Signed)
Pt BIB [facility] from home c/o worsening chest pain while sleeping. Patient rates pain 9 out of 10 describes pain as tightness in central chest with right arm radiation. States CP worsens with inspirations. Also c/o SOB.   EMS gave 324 ASA and 1 NTG in route.  BP 148/90 HR 96 100% RA

## 2020-03-15 LAB — TROPONIN I (HIGH SENSITIVITY): Troponin I (High Sensitivity): 4 ng/L (ref <18)

## 2020-03-15 MED ORDER — HYDROCODONE-ACETAMINOPHEN 5-325 MG PO TABS: 1.0000 | ORAL_TABLET | ORAL | 0 refills | Status: DC | PRN

## 2020-03-15 MED ORDER — MORPHINE SULFATE (PF) 4 MG/ML IV SOLN
4.0000 mg | Freq: Once | INTRAVENOUS | Status: AC
  Administered 2020-03-15: 4 mg via INTRAVENOUS
  Filled 2020-03-15: qty 1

## 2020-03-15 NOTE — ED Provider Notes (Signed)
Care assumed from Dr. Sedonia Small, patient with history of lupus anticoagulant being evaluated for chest pain.  She was signed out to me pending repeat troponin.  Repeat troponin showed no significant change.  Patient continues to complain of pain and she is given additional morphine.  She is discharged with prescription for a small number of hydrocodone-acetaminophen.  Follow-up with PCP.   Delora Fuel, MD 60/67/70 [phone]

## 2020-03-15 NOTE — ED Notes (Signed)
Discharge instructions discussed with pt. Pt verbalized understanding with no questions at this time. Pt ambulatory to wheelchair. Going home with husband at bedside.

## 2020-03-15 NOTE — Discharge Instructions (Addendum)
Return if you are having any problems. 

## 2020-03-15 NOTE — ED Notes (Signed)
Lab called requesting update on pending troponin. Lab sample sent 0017. Per lab tech, couple minutes left.

## 2020-04-17 DIAGNOSIS — Z20822 Contact with and (suspected) exposure to covid-19: Secondary | ICD-10-CM | POA: Diagnosis not present

## 2020-05-09 DIAGNOSIS — Z1152 Encounter for screening for COVID-19: Secondary | ICD-10-CM | POA: Diagnosis not present

## 2020-05-11 DIAGNOSIS — U071 COVID-19: Secondary | ICD-10-CM | POA: Diagnosis not present

## 2020-06-09 DIAGNOSIS — N644 Mastodynia: Secondary | ICD-10-CM | POA: Diagnosis not present

## 2020-06-12 ENCOUNTER — Other Ambulatory Visit: Payer: Self-pay | Admitting: Obstetrics and Gynecology

## 2020-06-12 DIAGNOSIS — N644 Mastodynia: Secondary | ICD-10-CM

## 2020-06-18 ENCOUNTER — Ambulatory Visit
Admission: RE | Admit: 2020-06-18 | Discharge: 2020-06-18 | Disposition: A | Discharge status: Home / Self Care | Payer: BC Managed Care – PPO | Source: Ambulatory Visit | Attending: Obstetrics and Gynecology | Admitting: Obstetrics and Gynecology

## 2020-06-18 ENCOUNTER — Other Ambulatory Visit: Payer: Self-pay

## 2020-06-18 DIAGNOSIS — N644 Mastodynia: Secondary | ICD-10-CM

## 2020-06-18 DIAGNOSIS — R922 Inconclusive mammogram: Secondary | ICD-10-CM | POA: Diagnosis not present

## 2020-06-18 DIAGNOSIS — N6311 Unspecified lump in the right breast, upper outer quadrant: Secondary | ICD-10-CM | POA: Diagnosis not present

## 2020-07-27 DIAGNOSIS — J019 Acute sinusitis, unspecified: Secondary | ICD-10-CM | POA: Diagnosis not present

## 2020-07-27 DIAGNOSIS — R059 Cough, unspecified: Secondary | ICD-10-CM | POA: Diagnosis not present

## 2020-07-29 ENCOUNTER — Encounter (HOSPITAL_COMMUNITY): Payer: Self-pay

## 2020-07-29 ENCOUNTER — Other Ambulatory Visit: Payer: Self-pay

## 2020-07-29 ENCOUNTER — Emergency Department (HOSPITAL_COMMUNITY)
Admission: EM | Admit: 2020-07-29 | Discharge: 2020-07-29 | Disposition: A | Discharge status: Home / Self Care | Payer: BC Managed Care – PPO | Attending: Emergency Medicine | Admitting: Emergency Medicine

## 2020-07-29 ENCOUNTER — Emergency Department (HOSPITAL_COMMUNITY): Payer: BC Managed Care – PPO

## 2020-07-29 DIAGNOSIS — I10 Essential (primary) hypertension: Secondary | ICD-10-CM | POA: Diagnosis not present

## 2020-07-29 DIAGNOSIS — M79662 Pain in left lower leg: Secondary | ICD-10-CM | POA: Diagnosis not present

## 2020-07-29 DIAGNOSIS — R0981 Nasal congestion: Secondary | ICD-10-CM | POA: Insufficient documentation

## 2020-07-29 DIAGNOSIS — M79661 Pain in right lower leg: Secondary | ICD-10-CM | POA: Insufficient documentation

## 2020-07-29 DIAGNOSIS — Z8616 Personal history of COVID-19: Secondary | ICD-10-CM | POA: Diagnosis not present

## 2020-07-29 DIAGNOSIS — H9203 Otalgia, bilateral: Secondary | ICD-10-CM | POA: Insufficient documentation

## 2020-07-29 DIAGNOSIS — Z87891 Personal history of nicotine dependence: Secondary | ICD-10-CM | POA: Diagnosis not present

## 2020-07-29 DIAGNOSIS — Z79899 Other long term (current) drug therapy: Secondary | ICD-10-CM | POA: Diagnosis not present

## 2020-07-29 DIAGNOSIS — R110 Nausea: Secondary | ICD-10-CM | POA: Insufficient documentation

## 2020-07-29 DIAGNOSIS — R509 Fever, unspecified: Secondary | ICD-10-CM | POA: Insufficient documentation

## 2020-07-29 DIAGNOSIS — R519 Headache, unspecified: Secondary | ICD-10-CM | POA: Diagnosis not present

## 2020-07-29 LAB — CBC WITH DIFFERENTIAL/PLATELET
Abs Immature Granulocytes: 0.2 10*3/uL — ABNORMAL HIGH (ref 0.00–0.07)
Basophils Absolute: 0 10*3/uL (ref 0.0–0.1)
Basophils Relative: 1 %
Eosinophils Absolute: 0 10*3/uL (ref 0.0–0.5)
Eosinophils Relative: 0 %
HCT: 45.3 % (ref 36.0–46.0)
Hemoglobin: 14.4 g/dL (ref 12.0–15.0)
Immature Granulocytes: 4 %
Lymphocytes Relative: 15 %
Lymphs Abs: 0.8 10*3/uL (ref 0.7–4.0)
MCH: 26.1 pg (ref 26.0–34.0)
MCHC: 31.8 g/dL (ref 30.0–36.0)
MCV: 82.1 fL (ref 80.0–100.0)
Monocytes Absolute: 0.6 10*3/uL (ref 0.1–1.0)
Monocytes Relative: 11 %
Neutro Abs: 3.7 10*3/uL (ref 1.7–7.7)
Neutrophils Relative %: 69 %
Platelets: 198 10*3/uL (ref 150–400)
RBC: 5.52 MIL/uL — ABNORMAL HIGH (ref 3.87–5.11)
RDW: 14.8 % (ref 11.5–15.5)
WBC: 5.4 10*3/uL (ref 4.0–10.5)
nRBC: 0 % (ref 0.0–0.2)

## 2020-07-29 LAB — CK: Total CK: 31 U/L — ABNORMAL LOW (ref 38–234)

## 2020-07-29 LAB — MAGNESIUM: Magnesium: 2 mg/dL (ref 1.7–2.4)

## 2020-07-29 LAB — BASIC METABOLIC PANEL
Anion gap: 9 (ref 5–15)
BUN: 12 mg/dL (ref 6–20)
CO2: 22 mmol/L (ref 22–32)
Calcium: 8.6 mg/dL — ABNORMAL LOW (ref 8.9–10.3)
Chloride: 101 mmol/L (ref 98–111)
Creatinine, Ser: 0.7 mg/dL (ref 0.44–1.00)
GFR, Estimated: >60 mL/min (ref >60)
Glucose, Bld: 179 mg/dL — ABNORMAL HIGH (ref 70–99)
Potassium: 3.5 mmol/L (ref 3.5–5.1)
Sodium: 132 mmol/L — ABNORMAL LOW (ref 135–145)

## 2020-07-29 MED ORDER — ACETAMINOPHEN 500 MG PO TABS
1000.0000 mg | ORAL_TABLET | Freq: Once | ORAL | Status: AC
  Administered 2020-07-29: 1000 mg via ORAL
  Filled 2020-07-29: qty 2

## 2020-07-29 MED ORDER — KETOROLAC TROMETHAMINE 15 MG/ML IJ SOLN
15.0000 mg | Freq: Once | INTRAMUSCULAR | Status: AC
  Administered 2020-07-29: 15 mg via INTRAVENOUS
  Filled 2020-07-29: qty 1

## 2020-07-29 MED ORDER — SODIUM CHLORIDE 0.9 % IV BOLUS
1000.0000 mL | Freq: Once | INTRAVENOUS | Status: AC
  Administered 2020-07-29: 1000 mL via INTRAVENOUS

## 2020-07-29 MED ORDER — PROCHLORPERAZINE EDISYLATE 10 MG/2ML IJ SOLN
10.0000 mg | Freq: Once | INTRAMUSCULAR | Status: AC
  Administered 2020-07-29: 10 mg via INTRAVENOUS
  Filled 2020-07-29: qty 2

## 2020-07-29 MED ORDER — DIPHENHYDRAMINE HCL 50 MG/ML IJ SOLN
12.5000 mg | Freq: Once | INTRAMUSCULAR | Status: AC
  Administered 2020-07-29: 12.5 mg via INTRAVENOUS
  Filled 2020-07-29: qty 1

## 2020-07-29 NOTE — ED Notes (Signed)
Pt resting with lights out.

## 2020-07-29 NOTE — ED Provider Notes (Signed)
[facility] Provider Note   [name]: [id number] Arrival date & time: 07/29/20  0242     History Chief Complaint  Patient presents with  . Headache    Kathleen Walsh is a 40 y.o. female with a history of hypertension, anemia, anxiety, & sleep apnea who presents to the emergency department with complaints of headache for the past 3 days as well as bilateral lower extremity cramping for the past 2 days.  Patient reports gradual onset headache to the frontal region (left greater than right) that has been waxing and waning in severity since onset.  Pain is worse with loud noises.  No alleviating factors.  She has had some associated bilateral ear pain, nasal congestion, sinus discomfort, and subjective fevers.  She was seen by her primary care provider who started her on doxycycline to treat for a sinus infection.  She has been taking this as prescribed without improvement in her symptoms.  She states she has a history of migraine, she has tried taking her Maxalt without relief.  This headache feels different from her prior migraines.  She also reports that over the past 2 days she has developed cramping in the bilateral lower extremities, constant, worse with movement and ambulation, no alleviating factors.  She states that she has had poor p.o. intake with some nausea.  She denies visual disturbance, numbness, weakness, dizziness, syncope, seizure, abdominal pain, chest pain, or dyspnea.  She had COVID in early January of this year.Denies recent trauma.  She is no longer on xarelto.   HPI     Past Medical History:  Diagnosis Date  . Abdominal wall pain   . Anemia   . Anxiety   . Arthritis    left ankle  . Blood dyscrasia    lupus anticoagulant during pregnancy  . Depression    takes cymbalta   . Headache(784.0)    migraines  . Hypertension   . Pre-diabetes   . Sleep apnea    USES C-PAP    Patient Active Problem List   Diagnosis Date Noted  .  Pulmonary nodule 08/08/2017  . OSA on CPAP 08/08/2017  . Acute back pain with sciatica, left 08/14/2016  . Trochanteric bursitis, left hip 08/12/2016  . Impingement syndrome of left ankle 07/20/2016  . 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 Pain in left ankle and joints of left foot 07/13/2016  . Chronic left-sided low back pain with left-sided sciatica 07/13/2016  . Abnormal EKG 04/12/2016  . Cardiomegaly 08/15/2013  . SOB (shortness of breath) 08/15/2013  . Pleuritic chest pain 08/15/2013  . Postpartum care following cesarean delivery and tubal sterilization (10/13) 02/11/2013  . Abdominal muscle strain 12/20/2011  . Constipation 07/06/2011  . Abdominal pain 07/06/2011  . DIABETES MELLITUS, GESTATIONAL 04/17/2009  . DYSPNEA 04/17/2009    Past Surgical History:  Procedure Laterality Date  . ABDOMINAL HYSTERECTOMY    . ANKLE ARTHROSCOPY WITH REPAIR SUBLUXING TENDON Left   . CESAREAN SECTION WITH BILATERAL TUBAL LIGATION Bilateral 02/11/2013   Procedure: Repeat CESAREAN SECTION WITH BILATERAL TUBAL LIGATION;  Surgeon: Lovenia Kim, MD;  Location: [facility];  Service: Obstetrics;  Laterality: Bilateral;  EDD: 03/01/13  . DEBRIDEMENT OF ABDOMINAL WALL ABSCESS N/A 12/03/2014   Procedure: INCISION OF ABDOMINAL WALL LIPOMA;  Surgeon: Greer Pickerel, MD;  Location: [facility];  Service: General;  Laterality: N/A;  . DILATION AND CURETTAGE OF UTERUS  2004  . DILITATION & CURRETTAGE/HYSTROSCOPY WITH NOVASURE ABLATION N/A 11/19/2014   Procedure:  DILATATION & CURETTAGE/HYSTEROSCOPY WITH NOVASURE ABLATION;  Surgeon: Brien Few, MD;  Location: [facility];  Service: Gynecology;  Laterality: N/A;  . ENDOMETRIAL ABLATION  10/20/14  . FOOT SURGERY  2008  . HERNIA REPAIR  09/19/2011  . LAPAROSCOPY N/A 12/03/2014   Procedure: LAPAROSCOPY DIAGNOSTIC WITH LYSIS OF ADHESIONS;  Surgeon: Greer Pickerel, MD;  Location: [facility];  Service: General;  Laterality: N/A;  . NASAL SEPTUM SURGERY    . TUBAL LIGATION    . VENTRAL  HERNIA REPAIR  09/20/10   Laparoscopic, Dr Greer Pickerel  . WISDOM TOOTH EXTRACTION       OB History    Gravida  4   Para  2   Term  2   Preterm      AB  2   Living  2     SAB      IAB      Ectopic      Multiple      Live Births  1           Family History  Problem Relation Age of Onset  . Diabetes Mother   . Hypertension Mother   . Diabetes Father   . Hypertension Father     Social History   Tobacco Use  . Smoking status: Former Smoker    Packs/day: 0.75    Years: 23.00    Pack years: 17.25    Types: Cigarettes    Start date: 05/2017  . Smokeless tobacco: Never Used  Vaping Use  . Vaping Use: Never used  Substance Use Topics  . Alcohol use: No    Comment: "once every three years"   . Drug use: No    Home Medications Prior to Admission medications   Medication Sig Start Date End Date Taking? Authorizing Provider  albuterol (PROVENTIL HFA;VENTOLIN HFA) 108 (90 Base) MCG/ACT inhaler Inhale 2 puffs into the lungs every 6 (six) hours as needed for wheezing or shortness of breath. 02/05/18   Magdalen Spatz, NP  busPIRone (BUSPAR) 15 MG tablet Take 15 mg by mouth daily. 07/16/19   [provider]  HYDROcodone-acetaminophen (NORCO) 5-325 MG tablet Take 1 tablet by mouth every 4 (four) hours as needed for moderate pain. 57/32/20   Delora Fuel, MD  loratadine (CLARITIN) 10 MG tablet Take 10 mg by mouth daily.    [provider]  methocarbamol (ROBAXIN) 500 MG tablet Take 1 tablet (500 mg total) by mouth every 8 (eight) hours as needed for muscle spasms. 10/02/19   McBane, Maylene Roes, PA-C  rivaroxaban (XARELTO) 10 MG TABS tablet Take 1 tablet (10 mg total) by mouth daily. 10/02/19 11/01/19  Ethelda Chick, PA-C  SUMAtriptan (IMITREX) 100 MG tablet Take 100 mg by mouth 2 (two) times daily as needed for migraine. 07/29/17   [provider]  topiramate (TOPAMAX) 50 MG tablet Take 50 mg by mouth daily.     [provider]   triamterene-hydrochlorothiazide (MAXZIDE-25) 37.5-25 MG tablet Take 1 tablet by mouth daily. 08/21/19   [provider]  TRINTELLIX 20 MG TABS tablet Take 20 mg by mouth daily. 09/20/19   [provider]    Allergies    Amoxicillin  Review of Systems   Review of Systems  Constitutional: Positive for appetite change and fever (subjective).  HENT: Positive for congestion, ear pain and sinus pain. Negative for sore throat.   Eyes: Negative for visual disturbance.  Respiratory: Negative for shortness of breath.   Cardiovascular: Negative for  chest pain and leg swelling.  Gastrointestinal: Positive for nausea. Negative for abdominal pain.  Musculoskeletal: Positive for arthralgias and myalgias.  Skin: Negative for color change, rash and wound.  Neurological: Positive for headaches. Negative for dizziness, syncope, weakness and numbness.  All other systems reviewed and are negative.   Physical Exam Updated Vital Signs BP (!) 148/101 (BP Location: Right Arm)   Pulse (!) 101   Temp 98.6 F (37 C)   LMP 08/31/2015   SpO2 96%   Physical Exam Vitals and nursing note reviewed.  Constitutional:      General: She is not in acute distress.    Appearance: She is well-developed. She is not ill-appearing or toxic-appearing.  HENT:     Head: Normocephalic and atraumatic.     Right Ear: Ear canal normal. Tympanic membrane is not perforated, erythematous, retracted or bulging.     Left Ear: Ear canal normal. Tympanic membrane is not perforated, erythematous, retracted or bulging.     Ears:     Comments: No mastoid erythema/swelling/tenderness.     Nose: Congestion present.     Right Sinus: Maxillary sinus tenderness and frontal sinus tenderness present.     Left Sinus: Maxillary sinus tenderness and frontal sinus tenderness present.     Mouth/Throat:     Pharynx: Uvula midline. No oropharyngeal exudate or posterior oropharyngeal erythema.     Comments: Posterior oropharynx  is symmetric appearing. Patient tolerating own secretions without difficulty. No trismus. No drooling. No hot potato voice. No swelling beneath the tongue, submandibular compartment is soft.  Eyes:     General: Vision grossly intact. Gaze aligned appropriately.        Right eye: No discharge.        Left eye: No discharge.     Extraocular Movements: Extraocular movements intact.     Conjunctiva/sclera: Conjunctivae normal.     Pupils: Pupils are equal, round, and reactive to light.     Comments: PERRL. No proptosis.   Cardiovascular:     Rate and Rhythm: Normal rate and regular rhythm.     Pulses:          Dorsalis pedis pulses are 2+ on the right side and 2+ on the left side.       Posterior tibial pulses are 2+ on the right side and 2+ on the left side.     Heart sounds: No murmur heard.   Pulmonary:     Effort: Pulmonary effort is normal. No respiratory distress.     Breath sounds: Normal breath sounds. No wheezing, rhonchi or rales.  Abdominal:     General: There is no distension.     Palpations: Abdomen is soft.     Tenderness: There is no abdominal tenderness.  Musculoskeletal:     Cervical back: Normal range of motion and neck supple. No edema or rigidity.     Comments: Lower extremities: No obvious deformity, appreciable swelling, edema, erythema, ecchymosis, warmth, or open wounds. Patient has intact AROM to bilateral hips, knees, ankles, and all digits. Mild diffuse tenderness bilaterally. No focal bony tenderness. Compartments are soft.   Lymphadenopathy:     Cervical: No cervical adenopathy.  Skin:    General: Skin is warm and dry.     Capillary Refill: Capillary refill takes less than 2 seconds.     Findings: No rash.  Neurological:     Mental Status: She is alert.     Comments: Alert. Clear speech. No facial droop. CNIII-XII grossly intact. Bilateral  upper and lower extremities' sensation grossly intact. 5/5 symmetric strength with grip strength and with plantar and  dorsi flexion bilaterally . Normal finger to nose bilaterally. Negative pronator drift. Ambulatory.   Psychiatric:        Mood and Affect: Mood normal.        Behavior: Behavior normal.     ED Results / Procedures / Treatments   Labs (all labs ordered are listed, but only abnormal results are displayed) Labs Reviewed  BASIC METABOLIC PANEL - Abnormal; Notable for the following components:      Result Value   Sodium 132 (*)    Glucose, Bld 179 (*)    Calcium 8.6 (*)    All other components within normal limits  CK - Abnormal; Notable for the following components:   Total CK 31 (*)    All other components within normal limits  CBC WITH DIFFERENTIAL/PLATELET - Abnormal; Notable for the following components:   RBC 5.52 (*)    Abs Immature Granulocytes 0.20 (*)    All other components within normal limits  MAGNESIUM    EKG None  Radiology CT Head Wo Contrast  Result Date: 07/29/2020 CLINICAL DATA:  Headache EXAM: CT HEAD WITHOUT CONTRAST TECHNIQUE: Contiguous axial images were obtained from the base of the skull through the vertex without intravenous contrast. COMPARISON:  None. FINDINGS: Brain: Normal anatomic configuration. No abnormal intra or extra-axial mass lesion or fluid collection. No abnormal mass effect or midline shift. No evidence of acute intracranial hemorrhage or infarct. Ventricular size is normal. Cerebellum unremarkable. Vascular: Unremarkable Skull: Intact Sinuses/Orbits: Paranasal sinuses are clear. Orbits are unremarkable. Other: Mastoid air cells and middle ear cavities are clear. IMPRESSION: No acute intracranial abnormality.  Normal examination. Electronically Signed   By: Fidela Salisbury MD   On: 07/29/2020 03:51    Procedures Procedures   Medications Ordered in ED Medications  sodium chloride 0.9 % bolus 1,000 mL (0 mLs Intravenous Stopped 07/29/20 0538)  prochlorperazine (COMPAZINE) injection 10 mg (10 mg Intravenous Given 07/29/20 0349)  diphenhydrAMINE  (BENADRYL) injection 12.5 mg (12.5 mg Intravenous Given 07/29/20 0349)  ketorolac (TORADOL) 15 MG/ML injection 15 mg (15 mg Intravenous Given 07/29/20 0510)  acetaminophen (TYLENOL) tablet 1,000 mg (1,000 mg Oral Given 07/29/20 0510)    ED Course  I have reviewed the triage vital signs and the nursing notes.  Pertinent labs & imaging results that were available during my care of the patient were reviewed by me and considered in my medical decision making (see chart for details).    MDM Rules/Calculators/A&P                         Patient presents to the ED with complaints of headache & leg cramping. She is nontoxic, vitals without significant abnormality, initial BP elevated however improved with repeat vital signs.   Additional history obtained:  Additional history obtained from chart review & nursing note review.   Lab Tests:  I Ordered, reviewed, and interpreted labs, which included:  CBC: No significant abnormalities noted. BMP: Mildly hyponatremic and hypocalcemic with elevated glucose.  Renal function is preserved. Magnesium: Within normal limits CK: No significant elevation.  Imaging Studies ordered:  I ordered imaging studies which included CT head without contrast, I independently reviewed, formal radiology impression shows: No acute intracranial abnormality.  Normal examination.  ED Course:  04:50: RE-EVAL: No change S/p compazine/benadryl. Will give toradol& tylenol.   05:30: RE-EVAL: Patient states she is feeling  better, her pain is improved, states is about a 4-5 out of 10 at this time, we discussed further medications and upon this discussion she is requesting to be discharged and preferred to go home to rest.  She remains without focal neurologic deficits, do not suspect ischemic CVA.  No sudden onset headache feel that [facility] is less likely.  CT head without bleed or findings of extension of a sinus infection/abscess.  Patient has no meningismus.  No visual disturbance.  Her  laboratory testing has all been fairly reassuring.  No significant electrolyte derangement or CK elevation in terms of her bilateral lower extremity cramping.  She additionally has no edema, pain is bilateral, feel it a DVT would be less likely.  Her lower extremities are well-perfused with 2+ symmetric pulses do not suspect ischemia.  No signs of infection to the lower extremities. No recent trauma or focal bony tenderness to raise concern for fx/dislocation. NVI distally.   With reassuring work-up, patient feeling improved, patient requesting to go home feel she is reasonable for discharge at this time.  Continue doxycycline per her primary care provider for possible sinusitis, suspect symptoms could be viral as well though.  She had a COVID-19 illness within the past 3 months therefore no repeat Covid testing. I discussed results, treatment plan, need for follow-up, and return precautions with the patient. Provided opportunity for questions, patient confirmed understanding and is in agreement with plan.   Portions of this note were generated with [employer]. Dictation errors may occur despite best attempts at proofreading  Final Clinical Impression(s) / ED Diagnoses Final diagnoses:  Frontal headache    Rx / DC Orders ED Discharge Orders    None       Amaryllis Dyke, PA-C 51/88/41 [id number]    Delora Fuel, MD 30/16/01 [phone]

## 2020-07-29 NOTE — ED Triage Notes (Signed)
Pt states she has had a headache x3 days. Pt seen for same and diagnosed with a sinus infection two days ago. States she is taking her antibiotic as prescribed. Pt c/o headache, sinus congestion, leg pains and ear aches.

## 2020-07-29 NOTE — Discharge Instructions (Addendum)
You were seen in the emergency department today for a headache with leg cramping.  Your work-up was overall reassuring.  Your CT scan of your head did not show any significant abnormalities.  Your blood test did show that your sodium and calcium were a bit low and that your blood sugar was elevated, please have these rechecked by your primary care provider.  Follow-up with your primary care provider within 3 days for reevaluation.  Return to the ER for new or worsening symptoms including but not limited to sudden change in headache, worsening pain, change in your vision, numbness, weakness, dizziness, inability to keep fluids down, neck stiffness, discoloration of the legs, increased pain in your legs, or any other concerns that you may have.

## 2020-07-29 NOTE — ED Notes (Signed)
Pt ambulating to bathroom.

## 2020-07-29 NOTE — ED Notes (Addendum)
Pain remains at 8/10 "I feel jittery"

## 2020-08-20 DIAGNOSIS — E1165 Type 2 diabetes mellitus with hyperglycemia: Secondary | ICD-10-CM | POA: Diagnosis not present

## 2020-08-20 DIAGNOSIS — D6861 Antiphospholipid syndrome: Secondary | ICD-10-CM | POA: Diagnosis not present

## 2020-08-20 DIAGNOSIS — I10 Essential (primary) hypertension: Secondary | ICD-10-CM | POA: Diagnosis not present

## 2020-08-20 DIAGNOSIS — E119 Type 2 diabetes mellitus without complications: Secondary | ICD-10-CM | POA: Diagnosis not present

## 2020-08-20 DIAGNOSIS — D72829 Elevated white blood cell count, unspecified: Secondary | ICD-10-CM | POA: Diagnosis not present

## 2020-09-17 DIAGNOSIS — Z79899 Other long term (current) drug therapy: Secondary | ICD-10-CM | POA: Diagnosis not present

## 2020-09-17 DIAGNOSIS — G43009 Migraine without aura, not intractable, without status migrainosus: Secondary | ICD-10-CM | POA: Diagnosis not present

## 2020-09-17 DIAGNOSIS — I10 Essential (primary) hypertension: Secondary | ICD-10-CM | POA: Diagnosis not present

## 2020-09-17 DIAGNOSIS — Z6841 Body Mass Index (BMI) 40.0 and over, adult: Secondary | ICD-10-CM | POA: Diagnosis not present

## 2020-09-17 DIAGNOSIS — E1165 Type 2 diabetes mellitus with hyperglycemia: Secondary | ICD-10-CM | POA: Diagnosis not present

## 2020-09-21 DIAGNOSIS — E875 Hyperkalemia: Secondary | ICD-10-CM | POA: Diagnosis not present

## 2020-09-29 ENCOUNTER — Other Ambulatory Visit: Payer: Self-pay | Admitting: Orthopaedic Surgery

## 2020-09-29 DIAGNOSIS — M25511 Pain in right shoulder: Secondary | ICD-10-CM | POA: Diagnosis not present

## 2020-10-08 ENCOUNTER — Ambulatory Visit
Admission: RE | Admit: 2020-10-08 | Discharge: 2020-10-08 | Disposition: A | Discharge status: Home / Self Care | Payer: BC Managed Care – PPO | Source: Ambulatory Visit | Attending: Orthopaedic Surgery | Admitting: Orthopaedic Surgery

## 2020-10-08 ENCOUNTER — Other Ambulatory Visit: Payer: Self-pay

## 2020-10-08 DIAGNOSIS — M25511 Pain in right shoulder: Secondary | ICD-10-CM | POA: Diagnosis not present

## 2020-10-08 DIAGNOSIS — M75101 Unspecified rotator cuff tear or rupture of right shoulder, not specified as traumatic: Secondary | ICD-10-CM | POA: Diagnosis not present

## 2020-10-27 DIAGNOSIS — M25511 Pain in right shoulder: Secondary | ICD-10-CM | POA: Diagnosis not present

## 2020-11-10 DIAGNOSIS — I10 Essential (primary) hypertension: Secondary | ICD-10-CM | POA: Diagnosis not present

## 2020-11-10 DIAGNOSIS — M25511 Pain in right shoulder: Secondary | ICD-10-CM | POA: Diagnosis not present

## 2020-11-10 DIAGNOSIS — Z01818 Encounter for other preprocedural examination: Secondary | ICD-10-CM | POA: Diagnosis not present

## 2020-11-10 DIAGNOSIS — E1165 Type 2 diabetes mellitus with hyperglycemia: Secondary | ICD-10-CM | POA: Diagnosis not present

## 2020-11-30 NOTE — Progress Notes (Signed)
DUE TO COVID-19 ONLY ONE VISITOR IS ALLOWED TO COME WITH YOU AND STAY IN THE WAITING ROOM ONLY DURING PRE OP AND PROCEDURE DAY OF SURGERY. THE 1 VISITOR  MAY VISIT WITH YOU AFTER SURGERY IN YOUR PRIVATE ROOM DURING VISITING HOURS ONLY!  YOU NEED TO HAVE A COVID 19 TEST ON_______ '@_______'$ , THIS TEST MUST BE DONE BEFORE SURGERY,  COVID TESTING SITE [address] [place] [address], IT IS ON THE RIGHT GOING OUT [address] APPROXIMATELY  2 MINUTES PAST [employer] ON THE RIGHT. ONCE YOUR COVID TEST IS COMPLETED,  PLEASE BEGIN THE QUARANTINE INSTRUCTIONS AS OUTLINED IN YOUR HANDOUT.                Kathleen Walsh  11/30/2020   Your procedure is scheduled on:            12/09/2020   Report to [facility] Main  Entrance   Report to admitting at      [phone]     Call this number if you have problems the morning of surgery [phone]    REMEMBER: NO  SOLID FOOD CANDY OR GUM AFTER MIDNIGHT. CLEAR LIQUIDS UNTIL     0530am       . NOTHING BY MOUTH EXCEPT CLEAR LIQUIDS UNTIL  0530am     . PLEASE FINISH ENSURE DRINK PER SURGEON ORDER  WHICH NEEDS TO BE COMPLETED AT  0530am   .      CLEAR LIQUID DIET   Foods Allowed                                                                    Coffee and tea, regular and decaf                            Fruit ices (not with fruit pulp)                                      Iced Popsicles                                    Carbonated beverages, regular and diet                                    Cranberry, grape and apple juices Sports drinks like Gatorade Lightly seasoned clear broth or consume(fat free) Sugar, honey syrup ___________________________________________________________________      BRUSH YOUR TEETH MORNING OF SURGERY AND RINSE YOUR MOUTH OUT, NO CHEWING GUM CANDY OR MINTS.     Take these medicines the morning of surgery with A SIP OF WATER:  none   DO NOT TAKE ANY DIABETIC MEDICATIONS DAY OF YOUR SURGERY                                You may not have any metal on your body including hair pins and  piercings  Do not wear jewelry, make-up, lotions, powders or perfumes, deodorant             Do not wear nail polish on your fingernails.  Do not shave  48 hours prior to surgery.              Men may shave face and neck.   Do not bring valuables to the hospital. [place].  Contacts, dentures or bridgework may not be worn into surgery.  Leave suitcase in the car. After surgery it may be brought to your room.     Patients discharged the day of surgery will not be allowed to drive home. IF YOU ARE HAVING SURGERY AND GOING HOME THE SAME DAY, YOU MUST HAVE AN ADULT TO DRIVE YOU HOME AND BE WITH YOU FOR 24 HOURS. YOU MAY GO HOME BY TAXI OR UBER OR ORTHERWISE, BUT AN ADULT MUST ACCOMPANY YOU HOME AND STAY WITH YOU FOR 24 HOURS.  Name and phone number of your driver:  Special Instructions: N/A              Please read over the following fact sheets you were given: _____________________________________________________________________  [facility] - Preparing for Surgery Before surgery, you can play an important role.  Because skin is not sterile, your skin needs to be as free of germs as possible.  You can reduce the number of germs on your skin by washing with CHG (chlorahexidine gluconate) soap before surgery.  CHG is an antiseptic cleaner which kills germs and bonds with the skin to continue killing germs even after washing. Please DO NOT use if you have an allergy to CHG or antibacterial soaps.  If your skin becomes reddened/irritated stop using the CHG and inform your nurse when you arrive at Short Stay. Do not shave (including legs and underarms) for at least 48 hours prior to the first CHG shower.  You may shave your face/neck. Please follow these instructions carefully:  1.  Shower with CHG Soap the night before surgery and the  morning of  Surgery.  2.  If you choose to wash your hair, wash your hair first as usual with your  normal  shampoo.  3.  After you shampoo, rinse your hair and body thoroughly to remove the  shampoo.                           4.  Use CHG as you would any other liquid soap.  You can apply chg directly  to the skin and wash                       Gently with a scrungie or clean washcloth.  5.  Apply the CHG Soap to your body ONLY FROM THE NECK DOWN.   Do not use on face/ open                           Wound or open sores. Avoid contact with eyes, ears mouth and genitals (private parts).                       Wash face,  Genitals (private parts) with your normal soap.             6.  Wash thoroughly, paying special attention to the area where your surgery  will be performed.  7.  Thoroughly rinse your body with warm water from the neck down.  8.  DO NOT shower/wash with your normal soap after using and rinsing off  the CHG Soap.                9.  Pat yourself dry with a clean towel.            10.  Wear clean pajamas.            11.  Place clean sheets on your bed the night of your first shower and do not  sleep with pets. Day of Surgery : Do not apply any lotions/deodorants the morning of surgery.  Please wear clean clothes to the hospital/surgery center.  FAILURE TO FOLLOW THESE INSTRUCTIONS MAY RESULT IN THE CANCELLATION OF YOUR SURGERY PATIENT SIGNATURE_________________________________  NURSE SIGNATURE__________________________________  ________________________________________________________________________

## 2020-12-01 ENCOUNTER — Encounter (HOSPITAL_COMMUNITY)
Admission: RE | Admit: 2020-12-01 | Discharge: 2020-12-01 | Disposition: A | Discharge status: Home / Self Care | Payer: BC Managed Care – PPO | Source: Ambulatory Visit | Attending: Orthopaedic Surgery | Admitting: Orthopaedic Surgery

## 2020-12-01 ENCOUNTER — Other Ambulatory Visit: Payer: Self-pay

## 2020-12-01 ENCOUNTER — Encounter (HOSPITAL_COMMUNITY): Payer: Self-pay

## 2020-12-01 DIAGNOSIS — M7551 Bursitis of right shoulder: Secondary | ICD-10-CM | POA: Diagnosis not present

## 2020-12-01 DIAGNOSIS — Z7901 Long term (current) use of anticoagulants: Secondary | ICD-10-CM | POA: Insufficient documentation

## 2020-12-01 DIAGNOSIS — I10 Essential (primary) hypertension: Secondary | ICD-10-CM | POA: Diagnosis not present

## 2020-12-01 DIAGNOSIS — E119 Type 2 diabetes mellitus without complications: Secondary | ICD-10-CM | POA: Diagnosis not present

## 2020-12-01 DIAGNOSIS — Z01812 Encounter for preprocedural laboratory examination: Secondary | ICD-10-CM | POA: Diagnosis not present

## 2020-12-01 DIAGNOSIS — Z79899 Other long term (current) drug therapy: Secondary | ICD-10-CM | POA: Diagnosis not present

## 2020-12-01 HISTORY — DX: Other complications of anesthesia, initial encounter: T88.59XA

## 2020-12-01 HISTORY — DX: Nausea with vomiting, unspecified: R11.2

## 2020-12-01 HISTORY — DX: Type 2 diabetes mellitus without complications: E11.9

## 2020-12-01 HISTORY — DX: Other specified postprocedural states: Z98.890

## 2020-12-01 HISTORY — DX: Personal history of diseases of the blood and blood-forming organs and certain disorders involving the immune mechanism: Z86.2

## 2020-12-01 LAB — BASIC METABOLIC PANEL
Anion gap: 10 (ref 5–15)
BUN: 17 mg/dL (ref 6–20)
CO2: 24 mmol/L (ref 22–32)
Calcium: 8.8 mg/dL — ABNORMAL LOW (ref 8.9–10.3)
Chloride: 103 mmol/L (ref 98–111)
Creatinine, Ser: 0.62 mg/dL (ref 0.44–1.00)
GFR, Estimated: >60 mL/min (ref >60)
Glucose, Bld: 137 mg/dL — ABNORMAL HIGH (ref 70–99)
Potassium: 4.2 mmol/L (ref 3.5–5.1)
Sodium: 137 mmol/L (ref 135–145)

## 2020-12-01 LAB — CBC
HCT: 50.2 % — ABNORMAL HIGH (ref 36.0–46.0)
Hemoglobin: 14.5 g/dL (ref 12.0–15.0)
MCH: 25.7 pg — ABNORMAL LOW (ref 26.0–34.0)
MCHC: 28.9 g/dL — ABNORMAL LOW (ref 30.0–36.0)
MCV: 89 fL (ref 80.0–100.0)
Platelets: 206 10*3/uL (ref 150–400)
RBC: 5.64 MIL/uL — ABNORMAL HIGH (ref 3.87–5.11)
RDW: 14.9 % (ref 11.5–15.5)
WBC: 11.1 10*3/uL — ABNORMAL HIGH (ref 4.0–10.5)
nRBC: 0 % (ref 0.0–0.2)

## 2020-12-01 LAB — GLUCOSE, CAPILLARY: Glucose-Capillary: 128 mg/dL — ABNORMAL HIGH (ref 70–99)

## 2020-12-01 NOTE — Progress Notes (Signed)
HGB!C done 11/10/20 at [place] per office and on chart results was 8.5.

## 2020-12-01 NOTE — Progress Notes (Addendum)
Anesthesia Review:  PCP: DR Daiva Eves at [facility] for clearance [phone]- requested LOV, clearance, hgba1c  and ekg They are to fax.  On chart along with clearance OV note dated 11/10/20  Cardiologist : none  Chest x-ray :  PFT- 2019  EKG :03/14/2020 and also done at [place] 11/10/20 on chart  Echo :2019  Stress test: Cardiac Cath :  Activity level: can do a flgiht of stairs without difficulty  Sleep Study/ CPAP : has cpap  Fasting Blood Sugar :      / Checks Blood Sugar -- times a day:   Blood Thinner/ Instructions /Last Dose: ASA / Instructions/ Last Dose :   DM- type 2 - checks glucose 3-4 x daily  Lupus anticoagulant disorder  With left shoulder surgery ( 10/2019)  at [facility] had nerve block and pt stated she bacame "panickly" after nerve block then had problems with N/V.  Hgba1c-8.5  per office date done was 11/10/20  Cbc done 12/01/20 routed to Dr Griffin Basil along with note stating hgbaic result from office on 11/10/20.

## 2020-12-03 NOTE — Progress Notes (Signed)
Anesthesia Chart Review   Case: [id number] Date/Time: 12/09/20 0815   Procedure: SHOULDER ARTHROSCOPY WITH SUBACROMIAL DECOMPRESSION AND DISTAL CLAVICLE EXCISION (Right)   Anesthesia type: Choice   Pre-op diagnosis: RIGHT SHOULDER BURSA TEAR, TENDINOSIS   Location: [facility] 06 / [facility]   Surgeons: Hiram Gash, MD       DISCUSSION:40 y.o. with h/o PONV, HTN, DM II, right shoulder tendinosis scheduled for above procedure 12/09/2020 with Dr. Ophelia Charter.   Pt seen by PCP 11/10/2020 for preoperative evaluation.  Per OV note A1C 8.5 which is improved from 9.6. Clearance from PCP which states pt is optimized for surgery from medical and cardiac standpoint. Evaluate CBG DOS.  VS: BP (!) 145/88   Pulse 78   Temp 36.8 C (Oral)   Resp 16   Ht '5\' 6"'$  (1.676 m)   Wt (!) 154.2 kg   LMP 08/31/2015   SpO2 98%   BMI 54.88 kg/m   PROVIDERS: Practice, [place]: Labs reviewed: Acceptable for surgery. (all labs ordered are listed, but only abnormal results are displayed)  Labs Reviewed  BASIC METABOLIC PANEL - Abnormal; Notable for the following components:      Result Value   Glucose, Bld 137 (*)    Calcium 8.8 (*)    All other components within normal limits  CBC - Abnormal; Notable for the following components:   WBC 11.1 (*)    RBC 5.64 (*)    HCT 50.2 (*)    MCH 25.7 (*)    MCHC 28.9 (*)    All other components within normal limits  GLUCOSE, CAPILLARY - Abnormal; Notable for the following components:   Glucose-Capillary 128 (*)    All other components within normal limits     IMAGES:   EKG: 03/14/2020 Rate 94 bpm  Sinus rhythm  Minimal ST depression, inferior leads  CV: Echo 11/24/2017 Study Conclusions   - Left ventricle: Normal GLS -18.6. The cavity size was normal.    Wall thickness was increased in a pattern of mild LVH. Systolic    function was normal. The estimated ejection fraction was in the    range of 60% to 65%. Wall motion was normal;  there were no    regional wall motion abnormalities. Doppler parameters are    consistent with both elevated ventricular end-diastolic filling    pressure and elevated left atrial filling pressure.  - Left atrium: The atrium was mildly dilated.  - Atrial septum: No defect or patent foramen ovale was identified.  Past Medical History:  Diagnosis Date   Abdominal wall pain    Anemia    Anxiety    Blood dyscrasia    lupus anticoagulant during pregnancy   Complication of anesthesia    pt states became " panicky" after left shoulder surgery 10/2019   Depression    takes cymbalta    Diabetes mellitus without complication (HCC)    [id number])    migraines   Hx of lupus anticoagulant disorder    Hypertension    PONV (postoperative nausea and vomiting)    Sleep apnea    USES C-PAP    Past Surgical History:  Procedure Laterality Date   ABDOMINAL HYSTERECTOMY     ANKLE ARTHROSCOPY WITH REPAIR SUBLUXING TENDON Left    CESAREAN SECTION WITH BILATERAL TUBAL LIGATION Bilateral 02/11/2013   Procedure: Repeat CESAREAN SECTION WITH BILATERAL TUBAL LIGATION;  Surgeon: Lovenia Kim, MD;  Location: [facility];  Service: Obstetrics;  Laterality:  Bilateral;  EDD: 03/01/13   DEBRIDEMENT OF ABDOMINAL WALL ABSCESS N/A 12/03/2014   Procedure: INCISION OF ABDOMINAL WALL LIPOMA;  Surgeon: Greer Pickerel, MD;  Location: [facility];  Service: General;  Laterality: N/A;   DILATION AND CURETTAGE OF UTERUS  2004   DILITATION & CURRETTAGE/HYSTROSCOPY WITH NOVASURE ABLATION N/A 11/19/2014   Procedure: DILATATION & CURETTAGE/HYSTEROSCOPY WITH NOVASURE ABLATION;  Surgeon: Brien Few, MD;  Location: [facility];  Service: Gynecology;  Laterality: N/A;   ENDOMETRIAL ABLATION  10/20/2014   FOOT SURGERY  2008   HERNIA REPAIR  09/19/2011   LAPAROSCOPY N/A 12/03/2014   Procedure: LAPAROSCOPY DIAGNOSTIC WITH LYSIS OF ADHESIONS;  Surgeon: Greer Pickerel, MD;  Location: [facility];  Service: General;  Laterality: N/A;   left  shoulder surgery      NASAL SEPTUM SURGERY     TUBAL LIGATION     VENTRAL HERNIA REPAIR  09/20/2010   Laparoscopic, Dr Greer Pickerel   WISDOM TOOTH EXTRACTION      MEDICATIONS:  albuterol (PROVENTIL HFA;VENTOLIN HFA) 108 (90 Base) MCG/ACT inhaler   dapagliflozin propanediol (FARXIGA) 10 MG TABS tablet   Dulaglutide (TRULICITY [place])   hydrochlorothiazide (HYDRODIURIL) 25 MG tablet   HYDROcodone-acetaminophen (NORCO) 5-325 MG tablet   lisinopril (ZESTRIL) 20 MG tablet   methocarbamol (ROBAXIN) 500 MG tablet   rizatriptan (MAXALT) 10 MG tablet   rosuvastatin (CRESTOR) 10 MG tablet   No current facility-administered medications for this encounter.   Konrad Felix, PA-C [facility] [phone]

## 2020-12-08 NOTE — H&P (Signed)
PREOPERATIVE H&P  Chief Complaint: RIGHT SHOULDER BURSA TEAR, TENDINOSIS  HPI: Kathleen Walsh is a 40 y.o. female who is scheduled for, Procedure(s): SHOULDER ARTHROSCOPY WITH SUBACROMIAL DECOMPRESSION AND DISTAL CLAVICLE EXCISION.   Patient has a past medical history significant for PONV, HTN, DM II.   Patient is a 40 year-old who works at [employer].  Previous left shoulder scope, subacromial decompression, distal clavicle excision and biceps tenotomy on June 2nd.  She has been doing really well in regards to her left shoulder.  She notes that her right shoulder has been bothering her for a couple of months.  It feels heavier.  It is a cramping sensation.  Constant ache.  She is not able to do her work duties due to pain in her right arm.  Range of motion is very limited.  She is right hand dominant.  She denies any traumatic injury to the right shoulder.    Her symptoms are rated as moderate to severe, and have been worsening.  This is significantly impairing activities of daily living.    Please see clinic note for further details on this patient's care.    She has elected for surgical management.   Past Medical History:  Diagnosis Date   Abdominal wall pain    Anemia    Anxiety    Blood dyscrasia    lupus anticoagulant during pregnancy   Complication of anesthesia    pt states became " panicky" after left shoulder surgery 10/2019   Depression    takes cymbalta    Diabetes mellitus without complication (HCC)    [id number])    migraines   Hx of lupus anticoagulant disorder    Hypertension    PONV (postoperative nausea and vomiting)    Sleep apnea    USES C-PAP   Past Surgical History:  Procedure Laterality Date   ABDOMINAL HYSTERECTOMY     ANKLE ARTHROSCOPY WITH REPAIR SUBLUXING TENDON Left    CESAREAN SECTION WITH BILATERAL TUBAL LIGATION Bilateral 02/11/2013   Procedure: Repeat CESAREAN SECTION WITH BILATERAL TUBAL LIGATION;  Surgeon: Lovenia Kim, MD;   Location: [facility];  Service: Obstetrics;  Laterality: Bilateral;  EDD: 03/01/13   DEBRIDEMENT OF ABDOMINAL WALL ABSCESS N/A 12/03/2014   Procedure: INCISION OF ABDOMINAL WALL LIPOMA;  Surgeon: Greer Pickerel, MD;  Location[facility];  Service: General;  Laterality: N/A;   DILATION AND CURETTAGE OF UTERUS  2004   DILITATION & CURRETTAGE/HYSTROSCOPY WITH NOVASURE ABLATION N/A 11/19/2014   Procedure: DILATATION & CURETTAGE/HYSTEROSCOPY WITH NOVASURE ABLATION;  Surgeon: Brien Few, MD;  Location: [facility];  Service: Gynecology;  Laterality: N/A;   ENDOMETRIAL ABLATION  10/20/2014   FOOT SURGERY  2008   HERNIA REPAIR  09/19/2011   LAPAROSCOPY N/A 12/03/2014   Procedure: LAPAROSCOPY DIAGNOSTIC WITH LYSIS OF ADHESIONS;  Surgeon: Greer Pickerel, MD;  Location[facility];  Service: General;  Laterality: N/A;   left shoulder surgery      NASAL SEPTUM SURGERY     TUBAL LIGATION     VENTRAL HERNIA REPAIR  09/20/2010   Laparoscopic, Dr Greer Pickerel   WISDOM TOOTH EXTRACTION     Social History   Socioeconomic History   Marital status: Married    Spouse name: Not on file   Number of children: Not on file   Years of education: Not on file   Highest education level: Not on file  Occupational History   Not on file  Tobacco Use   Smoking status: Former  Packs/day: 0.75    Years: 23.00    Pack years: 17.25    Types: Cigarettes    Start date: 05/2017   Smokeless tobacco: Never  Vaping Use   Vaping Use: Never used  Substance and Sexual Activity   Alcohol use: No    Comment: "once every three years"    Drug use: No   Sexual activity: Yes    Birth control/protection: Surgical  Other Topics Concern   Not on file  Social History Narrative   Not on file   Social Determinants of Health   Financial Resource Strain: Not on file  Food Insecurity: Not on file  Transportation Needs: Not on file  Physical Activity: Not on file  Stress: Not on file  Social Connections: Not on file   Family History   Problem Relation Age of Onset   Diabetes Mother    Hypertension Mother    Diabetes Father    Hypertension Father    Allergies  Allergen Reactions   Amoxicillin Nausea And Vomiting    Has patient had a PCN reaction causing immediate rash, facial/tongue/throat swelling, SOB or lightheadedness with hypotension: No Has patient had a PCN reaction causing severe rash involving mucus membranes or skin necrosis: No Has patient had a PCN reaction that required hospitalization: No Has patient had a PCN reaction occurring within the last 10 years: Yes If all of the above answers are "NO", then may proceed with Cephalosporin use.    Prior to Admission medications   Medication Sig Start Date End Date Taking? Authorizing Provider  dapagliflozin propanediol (FARXIGA) 10 MG TABS tablet Take 10 mg by mouth daily.   Yes [provider]  Dulaglutide (TRULICITY [place]) Inject into the skin every Thursday. .75 mg  every Thursday   Yes [provider]  hydrochlorothiazide (HYDRODIURIL) 25 MG tablet Take 25 mg by mouth daily.   Yes [provider]  lisinopril (ZESTRIL) 20 MG tablet Take 20 mg by mouth daily.   Yes [provider]  rizatriptan (MAXALT) 10 MG tablet Take 10 mg by mouth as needed for migraine. May repeat in 2 hours if needed   Yes [provider]  rosuvastatin (CRESTOR) 10 MG tablet Take 10 mg by mouth at bedtime.   Yes [provider]  albuterol (PROVENTIL HFA;VENTOLIN HFA) 108 (90 Base) MCG/ACT inhaler Inhale 2 puffs into the lungs every 6 (six) hours as needed for wheezing or shortness of breath. Patient not taking: Reported on 11/25/2020 02/05/18   Magdalen Spatz, NP  HYDROcodone-acetaminophen (NORCO) 5-325 MG tablet Take 1 tablet by mouth every 4 (four) hours as needed for moderate pain. Patient not taking: No sig reported [id number]   Delora Fuel, MD  methocarbamol (ROBAXIN) 500 MG tablet Take 1 tablet (500 mg total) by mouth every 8 (eight)  hours as needed for muscle spasms. Patient not taking: No sig reported 10/02/19   Ethelda Chick, PA-C    ROS: All other systems have been reviewed and were otherwise negative with the exception of those mentioned in the HPI and as above.  Physical Exam: General: Alert, no acute distress Cardiovascular: No pedal edema Respiratory: No cyanosis, no use of accessory musculature GI: No organomegaly, abdomen is soft and non-tender Skin: No lesions in the area of chief complaint Neurologic: Sensation intact distally Psychiatric: Patient is competent for consent with normal mood and affect Lymphatic: No axillary or cervical lymphadenopathy  MUSCULOSKELETAL:  Right shoulder: Positive AC tenderness to palpation, impingement and O'Brien's.  Cuff testing is weak throughout.    Imaging: Partial thickness supraspinatus tear noted.  This is mostly bursal side.  There are some changes in the infraspinatus as well.  There is subdeltoid bursitis.  Assessment: RIGHT SHOULDER BURSA TEAR, TENDINOSIS  Plan: Plan for Procedure(s): SHOULDER ARTHROSCOPY WITH SUBACROMIAL DECOMPRESSION AND DISTAL CLAVICLE EXCISION  The risks benefits and alternatives were discussed with the patient including but not limited to the risks of nonoperative treatment, versus surgical intervention including infection, bleeding, nerve injury,  blood clots, cardiopulmonary complications, morbidity, mortality, among others, and they were willing to proceed.   The patient acknowledged the explanation, agreed to proceed with the plan and consent was signed.   Operative Plan: left shoulder subacromial decompression, distal clavicle excision and biceps tenotomy with extensive debridement Discharge Medications: Tylenol, Diclofenac, Oxycodone, Zofran, Robaxin DVT Prophylaxis: Aspirin Physical Therapy: Outpatient PT Special Discharge needs: Sling   Ethelda Chick, PA-C  12/08/2020 5:08 PM

## 2020-12-09 ENCOUNTER — Ambulatory Visit (HOSPITAL_COMMUNITY): Payer: BC Managed Care – PPO | Admitting: Anesthesiology

## 2020-12-09 ENCOUNTER — Encounter (HOSPITAL_COMMUNITY)
Admission: RE | Disposition: A | Discharge status: Home / Self Care | Payer: Self-pay | Source: Home / Self Care | Attending: Orthopaedic Surgery

## 2020-12-09 ENCOUNTER — Encounter (HOSPITAL_COMMUNITY): Payer: Self-pay | Admitting: Orthopaedic Surgery

## 2020-12-09 ENCOUNTER — Ambulatory Visit (HOSPITAL_COMMUNITY)
Admission: RE | Admit: 2020-12-09 | Discharge: 2020-12-09 | Disposition: A | Discharge status: Home / Self Care | Payer: BC Managed Care – PPO | Attending: Orthopaedic Surgery | Admitting: Orthopaedic Surgery

## 2020-12-09 ENCOUNTER — Ambulatory Visit (HOSPITAL_COMMUNITY): Payer: BC Managed Care – PPO | Admitting: Physician Assistant

## 2020-12-09 DIAGNOSIS — E119 Type 2 diabetes mellitus without complications: Secondary | ICD-10-CM | POA: Insufficient documentation

## 2020-12-09 DIAGNOSIS — Z79899 Other long term (current) drug therapy: Secondary | ICD-10-CM | POA: Diagnosis not present

## 2020-12-09 DIAGNOSIS — X58XXXA Exposure to other specified factors, initial encounter: Secondary | ICD-10-CM | POA: Diagnosis not present

## 2020-12-09 DIAGNOSIS — Z7984 Long term (current) use of oral hypoglycemic drugs: Secondary | ICD-10-CM | POA: Diagnosis not present

## 2020-12-09 DIAGNOSIS — M24111 Other articular cartilage disorders, right shoulder: Secondary | ICD-10-CM | POA: Diagnosis not present

## 2020-12-09 DIAGNOSIS — Z880 Allergy status to penicillin: Secondary | ICD-10-CM | POA: Diagnosis not present

## 2020-12-09 DIAGNOSIS — M67813 Other specified disorders of tendon, right shoulder: Secondary | ICD-10-CM | POA: Insufficient documentation

## 2020-12-09 DIAGNOSIS — M19011 Primary osteoarthritis, right shoulder: Secondary | ICD-10-CM | POA: Diagnosis not present

## 2020-12-09 DIAGNOSIS — I10 Essential (primary) hypertension: Secondary | ICD-10-CM | POA: Diagnosis not present

## 2020-12-09 DIAGNOSIS — M7541 Impingement syndrome of right shoulder: Secondary | ICD-10-CM | POA: Insufficient documentation

## 2020-12-09 DIAGNOSIS — M7521 Bicipital tendinitis, right shoulder: Secondary | ICD-10-CM | POA: Diagnosis not present

## 2020-12-09 DIAGNOSIS — S43431A Superior glenoid labrum lesion of right shoulder, initial encounter: Secondary | ICD-10-CM | POA: Insufficient documentation

## 2020-12-09 DIAGNOSIS — Z87891 Personal history of nicotine dependence: Secondary | ICD-10-CM | POA: Diagnosis not present

## 2020-12-09 LAB — GLUCOSE, CAPILLARY
Glucose-Capillary: 145 mg/dL — ABNORMAL HIGH (ref 70–99)
Glucose-Capillary: 184 mg/dL — ABNORMAL HIGH (ref 70–99)

## 2020-12-09 SURGERY — SHOULDER ARTHROSCOPY WITH SUBACROMIAL DECOMPRESSION AND DISTAL CLAVICLE EXCISION
Anesthesia: General | Site: Shoulder | Laterality: Right

## 2020-12-09 MED ORDER — PROPOFOL 10 MG/ML IV BOLUS
INTRAVENOUS | Status: DC | PRN
  Administered 2020-12-09: 200 mg via INTRAVENOUS

## 2020-12-09 MED ORDER — DROPERIDOL 2.5 MG/ML IJ SOLN
INTRAMUSCULAR | Status: AC
  Filled 2020-12-09: qty 2

## 2020-12-09 MED ORDER — BUPIVACAINE HCL (PF) 0.25 % IJ SOLN
INTRAMUSCULAR | Status: DC | PRN
  Administered 2020-12-09: 30 mL

## 2020-12-09 MED ORDER — ONDANSETRON HCL 4 MG PO TABS: 4.0000 mg | ORAL_TABLET | Freq: Three times a day (TID) | ORAL | 0 refills | Status: AC | PRN

## 2020-12-09 MED ORDER — OXYCODONE HCL 5 MG/5ML PO SOLN: 5.0000 mg | Freq: Once | ORAL | Status: DC | PRN

## 2020-12-09 MED ORDER — BUPIVACAINE HCL (PF) 0.25 % IJ SOLN
INTRAMUSCULAR | Status: AC
  Filled 2020-12-09: qty 30

## 2020-12-09 MED ORDER — MIDAZOLAM HCL 2 MG/2ML IJ SOLN
INTRAMUSCULAR | Status: AC
  Filled 2020-12-09: qty 2

## 2020-12-09 MED ORDER — PROPOFOL 10 MG/ML IV BOLUS
INTRAVENOUS | Status: AC
  Filled 2020-12-09: qty 20

## 2020-12-09 MED ORDER — PHENYLEPHRINE HCL (PRESSORS) 10 MG/ML IV SOLN
INTRAVENOUS | Status: AC
  Filled 2020-12-09: qty 1

## 2020-12-09 MED ORDER — ROCURONIUM BROMIDE 10 MG/ML (PF) SYRINGE
PREFILLED_SYRINGE | INTRAVENOUS | Status: AC
  Filled 2020-12-09: qty 10

## 2020-12-09 MED ORDER — DICLOFENAC SODIUM 75 MG PO TBEC: 75.0000 mg | DELAYED_RELEASE_TABLET | Freq: Two times a day (BID) | ORAL | 0 refills | Status: DC

## 2020-12-09 MED ORDER — FENTANYL CITRATE (PF) 100 MCG/2ML IJ SOLN
INTRAMUSCULAR | Status: AC
  Filled 2020-12-09: qty 2

## 2020-12-09 MED ORDER — PROMETHAZINE HCL 25 MG/ML IJ SOLN: 6.2500 mg | INTRAMUSCULAR | Status: DC | PRN

## 2020-12-09 MED ORDER — LABETALOL HCL 5 MG/ML IV SOLN
INTRAVENOUS | Status: DC | PRN
  Administered 2020-12-09: 5 mg via INTRAVENOUS

## 2020-12-09 MED ORDER — METHOCARBAMOL 500 MG PO TABS: 500.0000 mg | ORAL_TABLET | Freq: Three times a day (TID) | ORAL | 0 refills | Status: DC | PRN

## 2020-12-09 MED ORDER — KETOROLAC TROMETHAMINE 30 MG/ML IJ SOLN
INTRAMUSCULAR | Status: DC | PRN
Start: 2020-12-09 — End: 2020-12-09
  Administered 2020-12-09: 30 mg via INTRAVENOUS

## 2020-12-09 MED ORDER — LACTATED RINGERS IV SOLN: INTRAVENOUS | Status: DC

## 2020-12-09 MED ORDER — OXYCODONE HCL 5 MG PO TABS: ORAL_TABLET | ORAL | 0 refills | Status: AC

## 2020-12-09 MED ORDER — DEXAMETHASONE SODIUM PHOSPHATE 10 MG/ML IJ SOLN
INTRAMUSCULAR | Status: DC | PRN
Start: 2020-12-09 — End: 2020-12-09
  Administered 2020-12-09: 10 mg via INTRAVENOUS

## 2020-12-09 MED ORDER — CHLORHEXIDINE GLUCONATE 0.12 % MT SOLN
15.0000 mL | Freq: Once | OROMUCOSAL | Status: AC
  Administered 2020-12-09: 15 mL via OROMUCOSAL

## 2020-12-09 MED ORDER — DROPERIDOL 2.5 MG/ML IJ SOLN
INTRAMUSCULAR | Status: DC | PRN
  Administered 2020-12-09: .625 mg via INTRAVENOUS

## 2020-12-09 MED ORDER — FENTANYL CITRATE (PF) 100 MCG/2ML IJ SOLN
INTRAMUSCULAR | Status: DC | PRN
  Administered 2020-12-09: 50 ug via INTRAVENOUS
  Administered 2020-12-09: 100 ug via INTRAVENOUS
  Administered 2020-12-09: 50 ug via INTRAVENOUS

## 2020-12-09 MED ORDER — ACETAMINOPHEN 500 MG PO TABS: 1000.0000 mg | ORAL_TABLET | Freq: Three times a day (TID) | ORAL | 0 refills | Status: AC

## 2020-12-09 MED ORDER — ONDANSETRON HCL 4 MG/2ML IJ SOLN
INTRAMUSCULAR | Status: DC | PRN
  Administered 2020-12-09: 4 mg via INTRAVENOUS

## 2020-12-09 MED ORDER — MIDAZOLAM HCL 2 MG/2ML IJ SOLN
INTRAMUSCULAR | Status: DC | PRN
  Administered 2020-12-09: 2 mg via INTRAVENOUS

## 2020-12-09 MED ORDER — MEPERIDINE HCL 50 MG/ML IJ SOLN: 6.2500 mg | INTRAMUSCULAR | Status: DC | PRN

## 2020-12-09 MED ORDER — ASPIRIN 81 MG PO CHEW: 81.0000 mg | CHEWABLE_TABLET | Freq: Two times a day (BID) | ORAL | 0 refills | Status: AC

## 2020-12-09 MED ORDER — ORAL CARE MOUTH RINSE: 15.0000 mL | Freq: Once | OROMUCOSAL | Status: AC

## 2020-12-09 MED ORDER — SUGAMMADEX SODIUM 500 MG/5ML IV SOLN
INTRAVENOUS | Status: DC | PRN
  Administered 2020-12-09: 300 mg via INTRAVENOUS

## 2020-12-09 MED ORDER — KETOROLAC TROMETHAMINE 30 MG/ML IJ SOLN
INTRAMUSCULAR | Status: AC
  Filled 2020-12-09: qty 1

## 2020-12-09 MED ORDER — SODIUM CHLORIDE 0.9 % IR SOLN
Status: DC | PRN
  Administered 2020-12-09 (×2): 3000 mL

## 2020-12-09 MED ORDER — ROCURONIUM BROMIDE 10 MG/ML (PF) SYRINGE
PREFILLED_SYRINGE | INTRAVENOUS | Status: DC | PRN
Start: 2020-12-09 — End: 2020-12-09
  Administered 2020-12-09: 40 mg via INTRAVENOUS

## 2020-12-09 MED ORDER — CEFAZOLIN IN SODIUM CHLORIDE 3-0.9 GM/100ML-% IV SOLN
3.0000 g | INTRAVENOUS | Status: AC
  Administered 2020-12-09: 3 g via INTRAVENOUS
  Filled 2020-12-09: qty 100

## 2020-12-09 MED ORDER — MIDAZOLAM HCL 2 MG/2ML IJ SOLN: 1.0000 mg | INTRAMUSCULAR | Status: DC

## 2020-12-09 MED ORDER — HYDROMORPHONE HCL 1 MG/ML IJ SOLN
0.2500 mg | INTRAMUSCULAR | Status: DC | PRN
  Administered 2020-12-09: 0.5 mg via INTRAVENOUS

## 2020-12-09 MED ORDER — SUCCINYLCHOLINE CHLORIDE 200 MG/10ML IV SOSY
PREFILLED_SYRINGE | INTRAVENOUS | Status: DC | PRN
  Administered 2020-12-09: 200 mg via INTRAVENOUS

## 2020-12-09 MED ORDER — LIDOCAINE 2% (20 MG/ML) 5 ML SYRINGE
INTRAMUSCULAR | Status: DC | PRN
  Administered 2020-12-09: 100 mg via INTRAVENOUS

## 2020-12-09 MED ORDER — EPINEPHRINE PF 1 MG/ML IJ SOLN
INTRAMUSCULAR | Status: DC | PRN
  Administered 2020-12-09: 2 mg

## 2020-12-09 MED ORDER — SUGAMMADEX SODIUM 500 MG/5ML IV SOLN
INTRAVENOUS | Status: AC
  Filled 2020-12-09: qty 5

## 2020-12-09 MED ORDER — EPINEPHRINE PF 1 MG/ML IJ SOLN
INTRAMUSCULAR | Status: AC
  Filled 2020-12-09: qty 2

## 2020-12-09 MED ORDER — OXYCODONE HCL 5 MG PO TABS
5.0000 mg | ORAL_TABLET | Freq: Once | ORAL | Status: DC | PRN
Start: 2020-12-09 — End: 2020-12-09

## 2020-12-09 MED ORDER — FENTANYL CITRATE (PF) 100 MCG/2ML IJ SOLN
50.0000 ug | INTRAMUSCULAR | Status: DC
Start: 2020-12-09 — End: 2020-12-09

## 2020-12-09 MED ORDER — HYDROMORPHONE HCL 1 MG/ML IJ SOLN
INTRAMUSCULAR | Status: AC
  Filled 2020-12-09: qty 1

## 2020-12-09 MED ORDER — DEXAMETHASONE SODIUM PHOSPHATE 10 MG/ML IJ SOLN
INTRAMUSCULAR | Status: AC
  Filled 2020-12-09: qty 1

## 2020-12-09 MED ORDER — ONDANSETRON HCL 4 MG/2ML IJ SOLN
INTRAMUSCULAR | Status: AC
  Filled 2020-12-09: qty 2

## 2020-12-09 MED ORDER — LABETALOL HCL 5 MG/ML IV SOLN
INTRAVENOUS | Status: AC
  Filled 2020-12-09: qty 4

## 2020-12-09 MED ORDER — LIDOCAINE 2% (20 MG/ML) 5 ML SYRINGE
INTRAMUSCULAR | Status: AC
  Filled 2020-12-09: qty 5

## 2020-12-09 SURGICAL SUPPLY — 53 items
BAG COUNTER SPONGE SURGICOUNT (BAG) IMPLANT
BLADE EXCALIBUR 4.0X13 (MISCELLANEOUS) ×2 IMPLANT
BLADE SURG 15 STRL LF DISP TIS (BLADE) IMPLANT
BLADE SURG 15 STRL SS (BLADE)
BURR OVAL 8 FLU 4.0X13 (MISCELLANEOUS) IMPLANT
BURR OVAL 8 FLU 5.0X13 (MISCELLANEOUS) ×2 IMPLANT
CANNULA TWIST IN 8.25X7CM (CANNULA) IMPLANT
CHLORAPREP W/TINT 26 (MISCELLANEOUS) ×2 IMPLANT
CLSR STERI-STRIP ANTIMIC 1/2X4 (GAUZE/BANDAGES/DRESSINGS) ×2 IMPLANT
DECANTER SPIKE VIAL GLASS SM (MISCELLANEOUS) IMPLANT
DISSECTOR  3.8MM X 13CM (MISCELLANEOUS) ×2
DISSECTOR 3.8MM X 13CM (MISCELLANEOUS) ×1 IMPLANT
DRAPE IMP U-DRAPE 54X76 (DRAPES) ×2 IMPLANT
DRAPE ORTHO SPLIT 77X108 STRL (DRAPES) ×4
DRAPE STERI 35X30 U-POUCH (DRAPES) ×2 IMPLANT
DRAPE SURG ORHT 6 SPLT 77X108 (DRAPES) ×2 IMPLANT
DRAPE U-SHAPE 47X51 STRL (DRAPES) ×2 IMPLANT
DRSG PAD ABDOMINAL 8X10 ST (GAUZE/BANDAGES/DRESSINGS) ×4 IMPLANT
DW OUTFLOW CASSETTE/TUBE SET (MISCELLANEOUS) ×2 IMPLANT
ELECT MENISCUS 165MM 90D (ELECTRODE) ×2 IMPLANT
ELECT REM PT RETURN 15FT ADLT (MISCELLANEOUS) ×2 IMPLANT
GAUZE SPONGE 4X4 12PLY STRL (GAUZE/BANDAGES/DRESSINGS) ×2 IMPLANT
GLOVE SRG 8 PF TXTR STRL LF DI (GLOVE) ×1 IMPLANT
GLOVE SURG ENC MOIS LTX SZ6.5 (GLOVE) ×4 IMPLANT
GLOVE SURG NEOPR MICRO LF SZ8 (GLOVE) ×2 IMPLANT
GLOVE SURG UNDER POLY LF SZ6.5 (GLOVE) ×2 IMPLANT
GLOVE SURG UNDER POLY LF SZ8 (GLOVE) ×2
GOWN STRL REUS W/ TWL LRG LVL3 (GOWN DISPOSABLE) ×2 IMPLANT
GOWN STRL REUS W/TWL LRG LVL3 (GOWN DISPOSABLE) ×4
IV NS IRRIG 3000ML ARTHROMATIC (IV SOLUTION) ×8 IMPLANT
KIT BASIN OR (CUSTOM PROCEDURE TRAY) ×2 IMPLANT
KIT STABILIZATION SHOULDER (MISCELLANEOUS) ×2 IMPLANT
MANIFOLD NEPTUNE II (INSTRUMENTS) ×2 IMPLANT
NEEDLE SCORPION MULTI FIRE (NEEDLE) IMPLANT
PACK ARTHROSCOPY WL (CUSTOM PROCEDURE TRAY) ×2 IMPLANT
PORT APPOLLO RF 90DEGREE MULTI (SURGICAL WAND) ×2 IMPLANT
RESTRAINT HEAD UNIVERSAL NS (MISCELLANEOUS) ×2 IMPLANT
SLING ARM FOAM STRAP LRG (SOFTGOODS) IMPLANT
SLING ARM FOAM STRAP MED (SOFTGOODS) IMPLANT
SLING ARM FOAM STRAP SML (SOFTGOODS) IMPLANT
SLING ARM FOAM STRAP XLG (SOFTGOODS) IMPLANT
SLING ARM IMMOBILIZER MED (SOFTGOODS) IMPLANT
SPONGE T-LAP 4X18 ~~LOC~~+RFID (SPONGE) ×2 IMPLANT
SUT ETHILON 3 0 PS 1 (SUTURE) IMPLANT
SUT FIBERWIRE #2 38 T-5 BLUE (SUTURE)
SUT MNCRL AB 4-0 PS2 18 (SUTURE) ×2 IMPLANT
SUT TIGER TAPE 7 IN WHITE (SUTURE) IMPLANT
SUTURE FIBERWR #2 38 T-5 BLUE (SUTURE) IMPLANT
TAPE FIBER 2MM 7IN #2 BLUE (SUTURE) IMPLANT
TOWEL OR 17X26 10 PK STRL BLUE (TOWEL DISPOSABLE) ×2 IMPLANT
TUBE SUCTION HIGH CAP CLEAR NV (SUCTIONS) ×2 IMPLANT
TUBING ARTHROSCOPY IRRIG 16FT (MISCELLANEOUS) ×2 IMPLANT
WATER STERILE IRR 1000ML POUR (IV SOLUTION) ×2 IMPLANT

## 2020-12-09 NOTE — Anesthesia Postprocedure Evaluation (Signed)
Anesthesia Post Note  Patient: Kathleen Walsh  Procedure(s) Performed: SHOULDER ARTHROSCOPY WITH SUBACROMIAL DECOMPRESSION AND DISTAL CLAVICLE EXCISION (Right: Shoulder)     Patient location during evaluation: PACU Anesthesia Type: General Level of consciousness: awake and alert Pain management: pain level controlled Vital Signs Assessment: post-procedure vital signs reviewed and stable Respiratory status: spontaneous breathing, nonlabored ventilation and respiratory function stable Cardiovascular status: blood pressure returned to baseline and stable Postop Assessment: no apparent nausea or vomiting Anesthetic complications: no   No notable events documented.  Last Vitals:  Vitals:   12/09/20 1400 12/09/20 1415  BP: (!) 132/93 (!) 152/80  Pulse: 84 82  Resp: (!) 8 (!) 23  Temp:    SpO2: (!) 88% 96%    Last Pain:  Vitals:   12/09/20 1358  TempSrc:   PainSc: [place] 

## 2020-12-09 NOTE — Interval H&P Note (Signed)
.  dtvup

## 2020-12-09 NOTE — Op Note (Signed)
Orthopaedic Surgery Operative Note ([name]: [id number])  [name]  [birth year] Date of Surgery: 12/09/2020   Diagnoses:  Right shoulder impingement and AC arthrosis with SLAP tear  Procedure: Arthroscopic extensive debridement Arthroscopic subacromial decompression Arthroscopic biceps tenodesis Arthroscopic distal clavicle excision   Operative Finding Right shoulder had full motion, there is intact rotator cuff.  There was a type II SLAP tear.  Cartilage surfaces were intact.  Subscap and superior cuff are intact bursal articular sided.  We performed a tenotomy and good decompression distal clavicle excision.   Successful completion of the planned procedure.   Post-operative plan: The patient will be non-weightbearing in a sling for a week with early therapy.  The patient will be discharged home.  DVT prophylaxis not indicated in ambulatory upper extremity patient without known risk factors.   Pain control with PRN pain medication preferring oral medicines.  Follow up plan will be scheduled in approximately 7 days for incision check and XR.  Post-Op Diagnosis: Same Surgeons:Primary: Hiram Gash, MD Assistants:Caroline McBane PA-C Location: [facility] Anesthesia: General with local as patient declined and Exparel block Antibiotics: Ancef 3 g Tourniquet time: None Estimated Blood Loss: Minimal Complications: None Specimens: None Implants: * No implants in log *  Indications for Surgery:   KHALESSI SCHLARB is a 40 y.o. female with continued shoulder pain refractory to nonoperative measures for extended period of time.    The risks and benefits were explained at length including but not limited to continued pain, cuff failure, biceps tenodesis failure, stiffness, need for further surgery and infection.   Procedure:   Patient was correctly identified in the preoperative holding area and operative site marked.  Patient brought to OR and positioned beachchair on an [place] table  ensuring that all bony prominences were padded and the head was in an appropriate location.  Anesthesia was induced and the operative shoulder was prepped and draped in the usual sterile fashion.  Timeout was called preincision.  A standard posterior viewing portal was made after localizing the portal with a spinal needle.  An anterior accessory portal was also made.  After clearing the articular space the camera was positioned in the subacromial space.  Findings above.    Extensive debridement was performed of the anterior interval tissue, labral fraying and the bursa.  Biceps tenotomy was performed and SLAP debridement was performed.  Subacromial decompression: We made a lateral portal with spinal needle guidance. We then proceeded to debride bursal tissue extensively with a shaver and arthrocare device. At that point we continued to identify the borders of the acromion and identify the spur. We then carefully preserved the deltoid fascia and used a burr to convert the acromion to a Type 1 flat acromion without issue.  Distal Clavicle resection:  The scope was placed in the subacromial space from the posterior portal.  A hemostat was placed through the anterior portal and we spread at the [facility] joint.  A burr was then inserted and 10 mm of distal clavicle was resected taking care to avoid damage to the capsule around the joint and avoiding overhanging bone posteriorly.    The incisions were closed with absorbable monocryl and steri strips.  A sterile dressing was placed along with a sling. The patient was awoken from general anesthesia and taken to the PACU in stable condition without complication.   Noemi Chapel, PA-C, present and scrubbed throughout the case, critical for completion in a timely fashion, and for retraction, instrumentation, closure.

## 2020-12-09 NOTE — Progress Notes (Signed)
Accidentally returned versed that [place], CRNA had pulled instead of the versed that I pulled. Bethena Roys charge nurse  aware, no further action needed at this time .

## 2020-12-09 NOTE — Transfer of Care (Signed)
Immediate Anesthesia Transfer of Care Note  Patient: Kathleen Walsh  Procedure(s) Performed: SHOULDER ARTHROSCOPY WITH SUBACROMIAL DECOMPRESSION AND DISTAL CLAVICLE EXCISION (Right: Shoulder)  Patient Location: PACU  Anesthesia Type:General  Level of Consciousness: drowsy  Airway & Oxygen Therapy: Patient Spontanous Breathing and Patient connected to face mask oxygen  Post-op Assessment: Report given to RN and Post -op Vital signs reviewed and stable  Post vital signs: Reviewed and stable  Last Vitals:  Vitals Value Taken Time  BP 156/96 12/09/20 1323  Temp    Pulse 90 12/09/20 1324  Resp 0 12/09/20 1324  SpO2 97 % 12/09/20 1324  Vitals shown include unvalidated device data.  Last Pain:  Vitals:   12/09/20 0926  TempSrc:   PainSc: 7          Complications: No notable events documented.

## 2020-12-09 NOTE — Anesthesia Preprocedure Evaluation (Signed)
Anesthesia Evaluation  Patient identified by MRN, date of birth, ID band Patient awake    Reviewed: Allergy & Precautions, NPO status , Patient's Chart, lab work & pertinent test results  History of Anesthesia Complications (+) PONV  Airway Mallampati: III  TM Distance: >3 FB Neck ROM: Full    Dental  (+) Dental Advisory Given   Pulmonary sleep apnea , former smoker,    breath sounds clear to auscultation       Cardiovascular hypertension, Pt. on medications  Rhythm:Regular Rate:Normal     Neuro/Psych  Headaches, Anxiety Depression  Neuromuscular disease    GI/Hepatic negative GI ROS, Neg liver ROS,   Endo/Other  diabetesMorbid obesity  Renal/GU negative Renal ROS     Musculoskeletal  (+) Arthritis , Osteoarthritis,    Abdominal (+) + obese,   Peds  Hematology negative hematology ROS (+)   Anesthesia Other Findings   Reproductive/Obstetrics                             Lab Results  Component Value Date   WBC 11.1 (H) 12/01/2020   HGB 14.5 12/01/2020   HCT 50.2 (H) 12/01/2020   MCV 89.0 12/01/2020   PLT 206 12/01/2020   Lab Results  Component Value Date   CREATININE 0.62 12/01/2020   BUN 17 12/01/2020   NA 137 12/01/2020   K 4.2 12/01/2020   CL 103 12/01/2020   CO2 24 12/01/2020    Anesthesia Physical  Anesthesia Plan  ASA: III  Anesthesia Plan: General   Post-op Pain Management:  Regional for Post-op pain   Induction: Intravenous  PONV Risk Score and Plan: 4 or greater and Dexamethasone, Ondansetron, Treatment may vary due to age or medical condition, Midazolam and Droperidol  Airway Management Planned: Oral ETT and LMA  Additional Equipment: None  Intra-op Plan:   Post-operative Plan: Extubation in OR  Informed Consent: I have reviewed the patients History and Physical, chart, labs and discussed the procedure including the risks, benefits and alternatives for  the proposed anesthesia with the patient or authorized representative who has indicated his/her understanding and acceptance.     Dental advisory given  Plan Discussed with: CRNA  Anesthesia Plan Comments:         Anesthesia Quick Evaluation

## 2020-12-09 NOTE — Discharge Instructions (Signed)
Ophelia Charter MD, MPH Noemi Chapel, PA-C [place] [address], [address] [phone] (tel)   [phone] (fax)   POST-OPERATIVE INSTRUCTIONS - SHOULDER ARTHROSCOPY  WOUND CARE You may remove the Operative Dressing on Post-Op Day #3 (72hrs after surgery).   Alternatively if you would like you can leave dressing on until follow-up if within 7-8 days but keep it dry. Leave steri-strips in place until they fall off on their own, usually 2 weeks postop. There may be a small amount of fluid/bleeding leaking at the surgical site.  This is normal; the shoulder is filled with fluid during the procedure and can leak for 24-48hrs after surgery.  You may change/reinforce the bandage as needed.  Use the Cryocuff or Ice as often as possible for the first 7 days, then as needed for pain relief. Always keep a towel, ACE wrap or other barrier between the cooling unit and your skin.  You may shower on Post-Op Day #3. Gently pat the area dry. Do not soak the shoulder in water or submerge it. Keep dry incisions as dry as possible. Do not go swimming in the pool or ocean until 4 weeks after surgery or when otherwise instructed.    EXERCISES/BRACING Sling should be used at all times until follow-up.  You can remove sling for hygiene.    Please continue to ambulate and do not stay sitting or lying for too long. Perform foot and wrist pumps to assist in circulation.  POST-OP MEDICATIONS- Multimodal approach to pain control In general your pain will be controlled with a combination of substances.  Prescriptions unless otherwise discussed are electronically sent to your pharmacy.  This is a carefully made plan we use to minimize narcotic use.     Diclofenac - Anti-inflammatory medication taken on a scheduled basis Acetaminophen - Non-narcotic pain medicine taken on a scheduled basis  Oxycodone - This is a strong narcotic, to be used only on an "as needed" basis for SEVERE  pain. Aspirin '81mg'$  - This medicine is used to minimize the risk of blood clots after surgery. Robaxin - this is a muscle relaxer, take as needed for muscle spasms Zofran - take as needed for nausea   FOLLOW-UP If you develop a Fever (?101.5), Redness or Drainage from the surgical incision site, please call our office to arrange for an evaluation. Please call the office to schedule a follow-up appointment for your wound check 10-14 days post-operatively.    HELPFUL INFORMATION  If you had a block, it will wear off between 8-24 hrs postop typically.  This is period when your pain may go from nearly zero to the pain you would have had postop without the block.  This is an abrupt transition but nothing dangerous is happening.  You may take an extra dose of narcotic when this happens.  You may be more comfortable sleeping in a semi-seated position the first few nights following surgery.  Keep a pillow propped under the elbow and forearm for comfort.  If you have a recliner type of chair it might be beneficial.  If not that is fine too, but it would be helpful to sleep propped up with pillows behind your operated shoulder as well under your elbow and forearm.  This will reduce pulling on the suture lines.  When dressing, put your operative arm in the sleeve first.  When getting undressed, take your operative arm out last.  Loose fitting, button-down shirts are recommended.  Often in the first days  after surgery you may be more comfortable keeping your operative arm under your shirt and not through the sleeve.  You may return to work/school in the next couple of days when you feel up to it.  Desk work and typing in the sling is fine.  We suggest you use the pain medication the first night prior to going to bed, in order to ease any pain when the anesthesia wears off. You should avoid taking pain medications on an empty stomach as it will make you nauseous.  You should wean off your narcotic  medicines as soon as you are able.  Most patients will be off or using minimal narcotics before their first postop appointment.   Do not drink alcoholic beverages or take illicit drugs when taking pain medications.  It is against the law to drive while taking narcotics.  In some states it is against the law to drive while your arm is in a sling.   Pain medication may make you constipated.  Below are a few solutions to try in this order: Decrease the amount of pain medication if you aren't having pain. Drink lots of decaffeinated fluids. Drink prune juice and/or eat dried prunes  If the first 3 don't work start with additional solutions Take Colace - an over-the-counter stool softener Take Senokot - an over-the-counter laxative Take Miralax - a stronger over-the-counter laxative  For more information including helpful videos and documents visit our website:   [email or web address]

## 2020-12-09 NOTE — Anesthesia Procedure Notes (Signed)
Procedure Name: Intubation Date/Time: 12/09/2020 12:17 PM Performed by: Sharlette Dense, CRNA Pre-anesthesia Checklist: Patient identified, Emergency Drugs available, Suction available and Patient being monitored Patient Re-evaluated:Patient Re-evaluated prior to induction Oxygen Delivery Method: Circle system utilized Preoxygenation: Pre-oxygenation with 100% oxygen Induction Type: IV induction Ventilation: Mask ventilation without difficulty Laryngoscope Size: Miller and 2 Grade View: Grade II Tube type: Oral Tube size: 7.5 mm Number of attempts: 1 Airway Equipment and Method: Stylet and Oral airway Placement Confirmation: ETT inserted through vocal cords under direct vision, positive ETCO2 and breath sounds checked- equal and bilateral Secured at: 22 cm Tube secured with: Tape Dental Injury: Teeth and Oropharynx as per pre-operative assessment

## 2020-12-10 NOTE — Addendum Note (Signed)
Addendum  created 12/10/20 1008 by Sharlette Dense, CRNA   Charge Capture section accepted

## 2020-12-17 DIAGNOSIS — M25511 Pain in right shoulder: Secondary | ICD-10-CM | POA: Diagnosis not present

## 2020-12-17 DIAGNOSIS — M6281 Muscle weakness (generalized): Secondary | ICD-10-CM | POA: Diagnosis not present

## 2020-12-17 DIAGNOSIS — M19011 Primary osteoarthritis, right shoulder: Secondary | ICD-10-CM | POA: Diagnosis not present

## 2020-12-17 DIAGNOSIS — M7521 Bicipital tendinitis, right shoulder: Secondary | ICD-10-CM | POA: Diagnosis not present

## 2020-12-17 DIAGNOSIS — M25611 Stiffness of right shoulder, not elsewhere classified: Secondary | ICD-10-CM | POA: Diagnosis not present

## 2020-12-22 DIAGNOSIS — M7521 Bicipital tendinitis, right shoulder: Secondary | ICD-10-CM | POA: Diagnosis not present

## 2020-12-22 DIAGNOSIS — M25611 Stiffness of right shoulder, not elsewhere classified: Secondary | ICD-10-CM | POA: Diagnosis not present

## 2020-12-22 DIAGNOSIS — M25511 Pain in right shoulder: Secondary | ICD-10-CM | POA: Diagnosis not present

## 2020-12-22 DIAGNOSIS — M6281 Muscle weakness (generalized): Secondary | ICD-10-CM | POA: Diagnosis not present

## 2020-12-31 DIAGNOSIS — M7521 Bicipital tendinitis, right shoulder: Secondary | ICD-10-CM | POA: Diagnosis not present

## 2020-12-31 DIAGNOSIS — M25611 Stiffness of right shoulder, not elsewhere classified: Secondary | ICD-10-CM | POA: Diagnosis not present

## 2020-12-31 DIAGNOSIS — M6281 Muscle weakness (generalized): Secondary | ICD-10-CM | POA: Diagnosis not present

## 2020-12-31 DIAGNOSIS — M25511 Pain in right shoulder: Secondary | ICD-10-CM | POA: Diagnosis not present

## 2021-01-07 DIAGNOSIS — M6281 Muscle weakness (generalized): Secondary | ICD-10-CM | POA: Diagnosis not present

## 2021-01-07 DIAGNOSIS — M7521 Bicipital tendinitis, right shoulder: Secondary | ICD-10-CM | POA: Diagnosis not present

## 2021-01-07 DIAGNOSIS — M25611 Stiffness of right shoulder, not elsewhere classified: Secondary | ICD-10-CM | POA: Diagnosis not present

## 2021-01-07 DIAGNOSIS — M25511 Pain in right shoulder: Secondary | ICD-10-CM | POA: Diagnosis not present

## 2021-01-12 DIAGNOSIS — M7521 Bicipital tendinitis, right shoulder: Secondary | ICD-10-CM | POA: Diagnosis not present

## 2021-01-12 DIAGNOSIS — M25611 Stiffness of right shoulder, not elsewhere classified: Secondary | ICD-10-CM | POA: Diagnosis not present

## 2021-01-12 DIAGNOSIS — M6281 Muscle weakness (generalized): Secondary | ICD-10-CM | POA: Diagnosis not present

## 2021-01-12 DIAGNOSIS — M25511 Pain in right shoulder: Secondary | ICD-10-CM | POA: Diagnosis not present

## 2021-01-14 DIAGNOSIS — M6281 Muscle weakness (generalized): Secondary | ICD-10-CM | POA: Diagnosis not present

## 2021-01-14 DIAGNOSIS — M25611 Stiffness of right shoulder, not elsewhere classified: Secondary | ICD-10-CM | POA: Diagnosis not present

## 2021-01-14 DIAGNOSIS — M7521 Bicipital tendinitis, right shoulder: Secondary | ICD-10-CM | POA: Diagnosis not present

## 2021-01-14 DIAGNOSIS — M25511 Pain in right shoulder: Secondary | ICD-10-CM | POA: Diagnosis not present

## 2021-01-19 DIAGNOSIS — M25511 Pain in right shoulder: Secondary | ICD-10-CM | POA: Diagnosis not present

## 2021-01-19 DIAGNOSIS — M25611 Stiffness of right shoulder, not elsewhere classified: Secondary | ICD-10-CM | POA: Diagnosis not present

## 2021-01-19 DIAGNOSIS — M6281 Muscle weakness (generalized): Secondary | ICD-10-CM | POA: Diagnosis not present

## 2021-01-19 DIAGNOSIS — M7521 Bicipital tendinitis, right shoulder: Secondary | ICD-10-CM | POA: Diagnosis not present

## 2021-01-26 DIAGNOSIS — G4733 Obstructive sleep apnea (adult) (pediatric): Secondary | ICD-10-CM | POA: Diagnosis not present

## 2021-01-28 DIAGNOSIS — M6281 Muscle weakness (generalized): Secondary | ICD-10-CM | POA: Diagnosis not present

## 2021-01-28 DIAGNOSIS — M7521 Bicipital tendinitis, right shoulder: Secondary | ICD-10-CM | POA: Diagnosis not present

## 2021-01-28 DIAGNOSIS — M25611 Stiffness of right shoulder, not elsewhere classified: Secondary | ICD-10-CM | POA: Diagnosis not present

## 2021-01-28 DIAGNOSIS — M25511 Pain in right shoulder: Secondary | ICD-10-CM | POA: Diagnosis not present

## 2021-02-02 DIAGNOSIS — M6281 Muscle weakness (generalized): Secondary | ICD-10-CM | POA: Diagnosis not present

## 2021-02-02 DIAGNOSIS — M25611 Stiffness of right shoulder, not elsewhere classified: Secondary | ICD-10-CM | POA: Diagnosis not present

## 2021-02-02 DIAGNOSIS — M25511 Pain in right shoulder: Secondary | ICD-10-CM | POA: Diagnosis not present

## 2021-02-02 DIAGNOSIS — M7521 Bicipital tendinitis, right shoulder: Secondary | ICD-10-CM | POA: Diagnosis not present

## 2021-02-05 DIAGNOSIS — M25611 Stiffness of right shoulder, not elsewhere classified: Secondary | ICD-10-CM | POA: Diagnosis not present

## 2021-02-05 DIAGNOSIS — M25511 Pain in right shoulder: Secondary | ICD-10-CM | POA: Diagnosis not present

## 2021-02-05 DIAGNOSIS — M6281 Muscle weakness (generalized): Secondary | ICD-10-CM | POA: Diagnosis not present

## 2021-02-05 DIAGNOSIS — M7521 Bicipital tendinitis, right shoulder: Secondary | ICD-10-CM | POA: Diagnosis not present

## 2021-02-16 DIAGNOSIS — M25611 Stiffness of right shoulder, not elsewhere classified: Secondary | ICD-10-CM | POA: Diagnosis not present

## 2021-02-16 DIAGNOSIS — M25511 Pain in right shoulder: Secondary | ICD-10-CM | POA: Diagnosis not present

## 2021-02-16 DIAGNOSIS — M6281 Muscle weakness (generalized): Secondary | ICD-10-CM | POA: Diagnosis not present

## 2021-02-16 DIAGNOSIS — M7521 Bicipital tendinitis, right shoulder: Secondary | ICD-10-CM | POA: Diagnosis not present

## 2021-02-23 DIAGNOSIS — M25611 Stiffness of right shoulder, not elsewhere classified: Secondary | ICD-10-CM | POA: Diagnosis not present

## 2021-02-23 DIAGNOSIS — M62511 Muscle wasting and atrophy, not elsewhere classified, right shoulder: Secondary | ICD-10-CM | POA: Diagnosis not present

## 2021-02-23 DIAGNOSIS — M7521 Bicipital tendinitis, right shoulder: Secondary | ICD-10-CM | POA: Diagnosis not present

## 2021-02-23 DIAGNOSIS — M6281 Muscle weakness (generalized): Secondary | ICD-10-CM | POA: Diagnosis not present

## 2021-05-20 DIAGNOSIS — Z23 Encounter for immunization: Secondary | ICD-10-CM | POA: Diagnosis not present

## 2021-05-20 DIAGNOSIS — E7800 Pure hypercholesterolemia, unspecified: Secondary | ICD-10-CM | POA: Diagnosis not present

## 2021-05-20 DIAGNOSIS — I10 Essential (primary) hypertension: Secondary | ICD-10-CM | POA: Diagnosis not present

## 2021-05-20 DIAGNOSIS — R600 Localized edema: Secondary | ICD-10-CM | POA: Diagnosis not present

## 2021-05-20 DIAGNOSIS — E1165 Type 2 diabetes mellitus with hyperglycemia: Secondary | ICD-10-CM | POA: Diagnosis not present

## 2021-05-20 DIAGNOSIS — Z1331 Encounter for screening for depression: Secondary | ICD-10-CM | POA: Diagnosis not present

## 2021-07-26 DIAGNOSIS — Z90710 Acquired absence of both cervix and uterus: Secondary | ICD-10-CM | POA: Diagnosis not present

## 2021-07-26 DIAGNOSIS — R109 Unspecified abdominal pain: Secondary | ICD-10-CM | POA: Diagnosis not present

## 2021-07-26 DIAGNOSIS — R102 Pelvic and perineal pain: Secondary | ICD-10-CM | POA: Diagnosis not present

## 2021-07-26 DIAGNOSIS — E1165 Type 2 diabetes mellitus with hyperglycemia: Secondary | ICD-10-CM | POA: Diagnosis not present

## 2021-07-26 DIAGNOSIS — R1031 Right lower quadrant pain: Secondary | ICD-10-CM | POA: Diagnosis not present

## 2021-08-12 DIAGNOSIS — K5900 Constipation, unspecified: Secondary | ICD-10-CM | POA: Diagnosis not present

## 2021-08-12 DIAGNOSIS — R1030 Lower abdominal pain, unspecified: Secondary | ICD-10-CM | POA: Diagnosis not present

## 2021-08-12 DIAGNOSIS — R110 Nausea: Secondary | ICD-10-CM | POA: Diagnosis not present

## 2021-08-26 ENCOUNTER — Encounter: Payer: Self-pay | Admitting: Internal Medicine

## 2021-09-14 ENCOUNTER — Encounter: Payer: Self-pay | Admitting: Internal Medicine

## 2021-09-14 ENCOUNTER — Ambulatory Visit: Payer: BC Managed Care – PPO | Admitting: Internal Medicine

## 2021-09-14 ENCOUNTER — Other Ambulatory Visit (INDEPENDENT_AMBULATORY_CARE_PROVIDER_SITE_OTHER): Payer: BC Managed Care – PPO

## 2021-09-14 VITALS — BP 124/82 | HR 87 | Ht 66.0 in | Wt 341.0 lb

## 2021-09-14 DIAGNOSIS — R110 Nausea: Secondary | ICD-10-CM

## 2021-09-14 DIAGNOSIS — R194 Change in bowel habit: Secondary | ICD-10-CM

## 2021-09-14 DIAGNOSIS — Z8379 Family history of other diseases of the digestive system: Secondary | ICD-10-CM

## 2021-09-14 DIAGNOSIS — R1030 Lower abdominal pain, unspecified: Secondary | ICD-10-CM

## 2021-09-14 DIAGNOSIS — K5900 Constipation, unspecified: Secondary | ICD-10-CM

## 2021-09-14 LAB — COMPREHENSIVE METABOLIC PANEL
ALT: 13 U/L (ref 0–35)
AST: 7 U/L (ref 0–37)
Albumin: 4 g/dL (ref 3.5–5.2)
Alkaline Phosphatase: 69 U/L (ref 39–117)
BUN: 11 mg/dL (ref 6–23)
CO2: 27 mEq/L (ref 19–32)
Calcium: 9.2 mg/dL (ref 8.4–10.5)
Chloride: 104 mEq/L (ref 96–112)
Creatinine, Ser: 0.64 mg/dL (ref 0.40–1.20)
GFR: 109.98 mL/min (ref >60.00)
Glucose, Bld: 143 mg/dL — ABNORMAL HIGH (ref 70–99)
Potassium: 4 mEq/L (ref 3.5–5.1)
Sodium: 139 mEq/L (ref 135–145)
Total Bilirubin: 0.4 mg/dL (ref 0.2–1.2)
Total Protein: 6.9 g/dL (ref 6.0–8.3)

## 2021-09-14 LAB — CBC WITH DIFFERENTIAL/PLATELET
Basophils Absolute: 0 10*3/uL (ref 0.0–0.1)
Basophils Relative: 0.2 % (ref 0.0–3.0)
Eosinophils Absolute: 0.2 10*3/uL (ref 0.0–0.7)
Eosinophils Relative: 1.4 % (ref 0.0–5.0)
HCT: 42.5 % (ref 36.0–46.0)
Hemoglobin: 13.6 g/dL (ref 12.0–15.0)
Lymphocytes Relative: 12.5 % (ref 12.0–46.0)
Lymphs Abs: 1.6 10*3/uL (ref 0.7–4.0)
MCHC: 32.1 g/dL (ref 30.0–36.0)
MCV: 79 fl (ref 78.0–100.0)
Monocytes Absolute: 0.6 10*3/uL (ref 0.1–1.0)
Monocytes Relative: 4.4 % (ref 3.0–12.0)
Neutro Abs: 10.7 10*3/uL — ABNORMAL HIGH (ref 1.4–7.7)
Neutrophils Relative %: 81.5 % — ABNORMAL HIGH (ref 43.0–77.0)
Platelets: 249 10*3/uL (ref 150.0–400.0)
RBC: 5.38 Mil/uL — ABNORMAL HIGH (ref 3.87–5.11)
RDW: 16.4 % — ABNORMAL HIGH (ref 11.5–15.5)
WBC: 13.1 10*3/uL — ABNORMAL HIGH (ref 4.0–10.5)

## 2021-09-14 LAB — HIGH SENSITIVITY CRP: CRP, High Sensitivity: 38.7 mg/L — ABNORMAL HIGH (ref 0.000–5.000)

## 2021-09-14 LAB — TSH: TSH: 1.62 u[IU]/mL (ref 0.35–5.50)

## 2021-09-14 LAB — LIPASE: Lipase: 25 U/L (ref 11.0–59.0)

## 2021-09-14 MED ORDER — LINACLOTIDE 145 MCG PO CAPS: 145.0000 ug | ORAL_CAPSULE | Freq: Every day | ORAL | 3 refills | Status: DC

## 2021-09-14 MED ORDER — ONDANSETRON HCL 8 MG PO TABS: 8.0000 mg | ORAL_TABLET | Freq: Three times a day (TID) | ORAL | 3 refills | Status: DC | PRN

## 2021-09-14 NOTE — Patient Instructions (Signed)
If you are age 42 or older, your body mass index should be between 23-30. Your Body mass index is 55.04 kg/m?Marland Kitchen If this is out of the aforementioned range listed, please consider follow up with your Primary Care Provider. ? ?If you are age 34 or younger, your body mass index should be between 19-25. Your Body mass index is 55.04 kg/m?Marland Kitchen If this is out of the aformentioned range listed, please consider follow up with your Primary Care Provider.  ? ?________________________________________________________ ? ?The [facility] providers would like to encourage you to use [facility] to communicate with providers for non-urgent requests or questions.  Due to long hold times on the telephone, sending your provider a message by [facility] may be a faster and more efficient way to get a response.  Please allow 48 business hours for a response.  Please remember that this is for non-urgent requests.  ?_______________________________________________________ ? ?Your provider has requested that you go to the basement level for lab work before leaving today. Press "B" on the elevator. The lab is located at the first door on the left as you exit the elevator. ? ?We have sent the following medications to your pharmacy for you to pick up at your convenience:  Zofran, Linzess ? ?You have been scheduled for a CT scan of the abdomen and pelvis at [facility] ([address]---this is in the same building as [facility]).  ? ?You are scheduled on 09/17/2021 at 3:00pm. You should arrive 15 minutes prior to your appointment time for registration. Please follow the written instructions below on the day of your exam: ? ?WARNING: IF YOU ARE ALLERGIC TO IODINE/X-RAY DYE, PLEASE NOTIFY RADIOLOGY IMMEDIATELY AT [phone]! YOU WILL BE GIVEN A 13 HOUR PREMEDICATION PREP. ? ?1) Do not eat anything after 11:00am (4 hours prior to your test) ?2) You have been given 2 bottles of oral contrast to drink. The solution may  taste better if refrigerated, but do NOT add ice or any other liquid to this solution. Shake well before drinking. ?  ? Drink 1 bottle of contrast @ 1:00pm (2 hours prior to your exam) ? Drink 1 bottle of contrast @ 2:00pm (1 hour prior to your exam) ? ?You may take any medications as prescribed with a small amount of water, if necessary. If you take any of the following medications: METFORMIN, GLUCOPHAGE, GLUCOVANCE, AVANDAMET, RIOMET, FORTAMET, ACTOPLUS MET, JANUMET, [place] or METAGLIP, you MAY be asked to HOLD this medication 48 hours AFTER the exam. ? ?The purpose of you drinking the oral contrast is to aid in the visualization of your intestinal tract. The contrast solution may cause some diarrhea. Depending on your individual set of symptoms, you may also receive an intravenous injection of x-ray contrast/dye. Plan on being at [facility] for 30 minutes or longer, depending on the type of exam you are having performed. ? ?This test typically takes 30-45 minutes to complete. ? ?If you have any questions regarding your exam or if you need to reschedule, you may call the CT department at [phone] between the hours of 8:00 am and 5:00 pm, Monday-Friday. ? ?________________________________________________________ ?You have been scheduled for an endoscopy and colonoscopy. Please follow the written instructions given to you at your visit today. ?Please pick up your prep supplies at the pharmacy within the next 1-3 days. ?If you use inhalers (even only as needed), please bring them with you on the day of your procedure. ? ?

## 2021-09-14 NOTE — Progress Notes (Signed)
? ?Chief Complaint: Abdominal pain  ? ?HPI : 41 year old female with history of DM, OSA, depression presents with abdominal pain ? ?She has had abdominal pain over the last year. The pain is located in the lower abdomen. When she has a BM, she does not have significant improvement in her ab pain. The pain gets better when she stretches out. Every day she has issue with nausea. She will go from being constipated to the following week she will have diarrhea. She used to go every day to have a BM. Dad has IBS and UC. Grandmother had Crohn's disease. Last colonoscopy in 2008 and EGD in 2019 were done at [facility] that showed some benign polyps. These were done for the same issues where she was having trouble using the bathroom, but she feels like her symptoms are more severe now. She was taking Zofran, which seemed to help somewhat, but she has now run out of Zofran. She was taking Miralax a capful a day, but this stopped being as effective. She tried to increase her dosage to two Miralax doses per day but she is still having very little stools. Denies blood in the stools. She has experienced vomiting as well. Denies blood in vomitus and dysphagia. Every once a while, she will get some heartburn, but this in general rare depending what she eats. Denies alcohol use. ? ?She works as a back end Scientist, clinical (histocompatibility and immunogenetics) at [employer].  ? ?Wt Readings from Last 3 Encounters:  ?09/14/21 (!) 341 lb (154.7 kg)  ?12/09/20 (!) 340 lb (154.2 kg)  ?12/01/20 (!) 340 lb (154.2 kg)  ? ?Past Medical History:  ?Diagnosis Date  ? Abdominal wall pain   ? Anemia   ? Anxiety   ? Blood dyscrasia   ? lupus anticoagulant during pregnancy  ? Complication of anesthesia   ? pt states became " panicky" after left shoulder surgery 10/2019  ? Depression   ? takes cymbalta   ? Diabetes mellitus without complication ([place])   ? Headache(784.0)   ? migraines  ? Hx of lupus anticoagulant disorder   ? Hypertension   ? PONV (postoperative nausea and vomiting)   ? Sleep apnea    ? USES C-PAP  ? ? ? ?Past Surgical History:  ?Procedure Laterality Date  ? ABDOMINAL HYSTERECTOMY    ? ANKLE ARTHROSCOPY WITH REPAIR SUBLUXING TENDON Left   ? CESAREAN SECTION WITH BILATERAL TUBAL LIGATION Bilateral 02/11/2013  ? Procedure: Repeat CESAREAN SECTION WITH BILATERAL TUBAL LIGATION;  Surgeon: Lovenia Kim, MD;  Location: [facility];  Service: Obstetrics;  Laterality: Bilateral;  EDD: 03/01/13  ? DEBRIDEMENT OF ABDOMINAL WALL ABSCESS N/A 12/03/2014  ? Procedure: INCISION OF ABDOMINAL WALL LIPOMA;  Surgeon: Greer Pickerel, MD;  Location: [facility];  Service: General;  Laterality: N/A;  ? [place] OF UTERUS  2004  ? DILITATION & CURRETTAGE/HYSTROSCOPY WITH NOVASURE ABLATION N/A 11/19/2014  ? Procedure: DILATATION & CURETTAGE/HYSTEROSCOPY WITH NOVASURE ABLATION;  Surgeon: Brien Few, MD;  Location: [facility];  Service: Gynecology;  Laterality: N/A;  ? ENDOMETRIAL ABLATION  10/20/2014  ? FOOT SURGERY  2008  ? HERNIA REPAIR  09/19/2011  ? LAPAROSCOPY N/A 12/03/2014  ? Procedure: LAPAROSCOPY DIAGNOSTIC WITH LYSIS OF ADHESIONS;  Surgeon: Greer Pickerel, MD;  Location: [facility];  Service: General;  Laterality: N/A;  ? left shoulder surgery     ? NASAL SEPTUM SURGERY    ? TUBAL LIGATION    ? VENTRAL HERNIA REPAIR  09/20/2010  ? Laparoscopic,  Dr Greer Pickerel  ? WISDOM TOOTH EXTRACTION    ? ?Family History  ?Problem Relation Age of Onset  ? Diabetes Mother   ? Hypertension Mother   ? Diabetes Father   ? Hypertension Father   ? ?Social History  ? ?Tobacco Use  ? Smoking status: Former  ?  Packs/day: 0.75  ?  Years: 23.00  ?  Pack years: 17.25  ?  Types: Cigarettes  ?  Start date: 05/2017  ? Smokeless tobacco: Never  ?Vaping Use  ? Vaping Use: Never used  ?Substance Use Topics  ? Alcohol use: No  ?  Comment: "once every three years"   ? Drug use: No  ? ?Current Outpatient Medications  ?Medication Sig Dispense Refill  ? dapagliflozin propanediol (FARXIGA) 10 MG TABS tablet Take 10 mg by mouth daily.    ?  diclofenac (VOLTAREN) 75 MG EC tablet Take 1 tablet (75 mg total) by mouth 2 (two) times daily. For pain and inflammation 60 tablet 0  ? Dulaglutide (TRULICITY Lupus) Inject into the skin every Thursday. .75 mg  every Thursday    ? hydrochlorothiazide (HYDRODIURIL) 25 MG tablet Take 25 mg by mouth daily.    ? lisinopril (ZESTRIL) 20 MG tablet Take 20 mg by mouth daily.    ? methocarbamol (ROBAXIN) 500 MG tablet Take 1 tablet (500 mg total) by mouth every 8 (eight) hours as needed for muscle spasms. 20 tablet 0  ? rizatriptan (MAXALT) 10 MG tablet Take 10 mg by mouth as needed for migraine. May repeat in 2 hours if needed    ? rosuvastatin (CRESTOR) 10 MG tablet Take 10 mg by mouth at bedtime.    ? ?No current facility-administered medications for this visit.  ? ?Allergies  ?Allergen Reactions  ? Amoxicillin Nausea And Vomiting  ?  Has patient had a PCN reaction causing immediate rash, facial/tongue/throat swelling, SOB or lightheadedness with hypotension: No ?Has patient had a PCN reaction causing severe rash involving mucus membranes or skin necrosis: No ?Has patient had a PCN reaction that required hospitalization: No ?Has patient had a PCN reaction occurring within the last 10 years: Yes ?If all of the above answers are "NO", then may proceed with Cephalosporin use. ?  ? ? ? ?Review of Systems: ?All systems reviewed and negative except where noted in HPI.  ? ?Physical Exam: ?Ht '5\' 6"'$  (1.676 m)   Wt (!) 341 lb (154.7 kg)   LMP 08/31/2015   BMI 55.04 kg/m?  ?Constitutional: Pleasant,well-developed, female in no acute distress. ?HEENT: Normocephalic and atraumatic. Conjunctivae are normal. No scleral icterus. ?Cardiovascular: Normal rate, regular rhythm.  ?Pulmonary/chest: Effort normal and breath sounds normal. No wheezing, rales or rhonchi. ?Abdominal: Soft, nondistended, tender in the epigastric area and LLQ. Bowel sounds active throughout. There are no masses palpable. No hepatomegaly. ?Extremities: No  edema ?Neurological: Alert and oriented to person place and time. ?Skin: Skin is warm and dry. No rashes noted. ?Psychiatric: Normal mood and affect. Behavior is normal. ? ?Labs 11/2020: CBC with elevated WBC of 11.1. BMP unremarkable ? ?CTA A/P w/contrast 08/05/17: ?1. No evidence of abdominal aortic atherosclerosis, dissection or aneurysm. ?2. No acute abnormalities involving the abdomen or pelvis. Large colonic stool burden. ?3. 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 ?4. No evidence of recurrent VENTRAL hernia after prior repair. ? ?Pelvic U/S 06/05/19: ?IMPRESSION: ?Status post hysterectomy. The ovaries are not visualized and ?  appeared within normal limits on recent CT. ? ?CT A/P w/o contrast 06/05/19: ?IMPRESSION: ?There again noted changes of bowel malrotation with the cecum identified in the midline in the majority of the small bowel seen within the right abdomen. These changes are stable from the prior exam. No obstructive changes are seen. ?No acute abnormality noted ? ?ASSESSMENT AND PLAN: ?N&V ?Lower abdominal pain ?Constipation ?Epigastric and lower abdominal pain ?Change in bowel habits ?Patient presents with worsened constipation, lower abdominal pain, and N&V over the last year. Her symptoms may be due to underlying constipation. Miralax has become less effective over time. Will plan to start her on Linzess to see if this helps with her symptoms. Will also check some labs and plan for CT A/P for further evaluation. Due to the changes in her bowel habits and her epigastric ab pain, will plan for EGD and colonoscopy for further evaluation as well. ?- Zofran PRN ?- Aim to drink 8 cups, walk 30 minutes per day, daily fiber supplement ?- Start Linzess 145 mcg QD ?- Check CBC, CMP, lipase, CRP, TSH, TTG IgA, IgA ?- CT A/P w/contrast ?- EGD/colonoscopy [facility] ? ?Christia Reading, MD ? ?I spent 60 minutes of time, including in  depth chart review, independent review of results as outlined above, communicating results with the patient directly, face-to-face time with the patient, coordinating care, ordering studies and medications as appropriate,

## 2021-09-15 LAB — IGA: Immunoglobulin A: 136 mg/dL (ref 47–310)

## 2021-09-17 ENCOUNTER — Inpatient Hospital Stay: Admission: RE | Admit: 2021-09-17 | Payer: BC Managed Care – PPO | Source: Ambulatory Visit

## 2021-10-01 ENCOUNTER — Ambulatory Visit (INDEPENDENT_AMBULATORY_CARE_PROVIDER_SITE_OTHER)
Admission: RE | Admit: 2021-10-01 | Discharge: 2021-10-01 | Disposition: A | Discharge status: Home / Self Care | Payer: BC Managed Care – PPO | Source: Ambulatory Visit | Attending: Internal Medicine | Admitting: Internal Medicine

## 2021-10-01 DIAGNOSIS — Z8379 Family history of other diseases of the digestive system: Secondary | ICD-10-CM

## 2021-10-01 DIAGNOSIS — R161 Splenomegaly, not elsewhere classified: Secondary | ICD-10-CM | POA: Diagnosis not present

## 2021-10-01 DIAGNOSIS — R1030 Lower abdominal pain, unspecified: Secondary | ICD-10-CM

## 2021-10-01 DIAGNOSIS — R110 Nausea: Secondary | ICD-10-CM

## 2021-10-01 DIAGNOSIS — K5900 Constipation, unspecified: Secondary | ICD-10-CM

## 2021-10-01 DIAGNOSIS — R194 Change in bowel habit: Secondary | ICD-10-CM

## 2021-10-01 DIAGNOSIS — K760 Fatty (change of) liver, not elsewhere classified: Secondary | ICD-10-CM | POA: Diagnosis not present

## 2021-10-01 MED ORDER — IOHEXOL 300 MG/ML  SOLN
100.0000 mL | Freq: Once | INTRAMUSCULAR | Status: AC | PRN
  Administered 2021-10-01: 100 mL via INTRAVENOUS

## 2021-11-03 ENCOUNTER — Encounter: Payer: Self-pay | Admitting: Internal Medicine

## 2021-11-03 ENCOUNTER — Other Ambulatory Visit (INDEPENDENT_AMBULATORY_CARE_PROVIDER_SITE_OTHER): Payer: BC Managed Care – PPO

## 2021-11-03 ENCOUNTER — Ambulatory Visit: Payer: BC Managed Care – PPO | Admitting: Internal Medicine

## 2021-11-03 VITALS — BP 140/70 | HR 100 | Ht 65.5 in | Wt 334.2 lb

## 2021-11-03 DIAGNOSIS — R112 Nausea with vomiting, unspecified: Secondary | ICD-10-CM

## 2021-11-03 DIAGNOSIS — R161 Splenomegaly, not elsewhere classified: Secondary | ICD-10-CM

## 2021-11-03 DIAGNOSIS — R1030 Lower abdominal pain, unspecified: Secondary | ICD-10-CM

## 2021-11-03 DIAGNOSIS — K760 Fatty (change of) liver, not elsewhere classified: Secondary | ICD-10-CM

## 2021-11-03 DIAGNOSIS — Z1159 Encounter for screening for other viral diseases: Secondary | ICD-10-CM | POA: Diagnosis not present

## 2021-11-03 DIAGNOSIS — K5900 Constipation, unspecified: Secondary | ICD-10-CM | POA: Diagnosis not present

## 2021-11-03 LAB — PROTIME-INR
INR: 1.1 ratio — ABNORMAL HIGH (ref 0.8–1.0)
Prothrombin Time: 12.2 s (ref 9.6–13.1)

## 2021-11-03 LAB — C-REACTIVE PROTEIN: CRP: 5 mg/dL (ref 0.5–20.0)

## 2021-11-03 NOTE — Patient Instructions (Addendum)
If you are age 41 or older, your body mass index should be between 23-30. Your Body mass index is 54.78 kg/m. If this is out of the aforementioned range listed, please consider follow up with your Primary Care Provider.  If you are age 52 or younger, your body mass index should be between 19-25. Your Body mass index is 54.78 kg/m. If this is out of the aformentioned range listed, please consider follow up with your Primary Care Provider.   Your provider has requested that you go to the basement level for lab work before leaving today. Press "B" on the elevator. The lab is located at the first door on the left as you exit the elevator.  You will be contacted by [facility] in the next 2 days to arrange a Ultrasound.  The number on your caller ID will be [phone], please answer when they call.  If you have not heard from them in 2 days please call [phone] to schedule.     We will send a referral to [facility] and they will call you to make an appointment. Also contact [facility] at [phone] to get signed up for a seminar for more information on Bariatric Surgery.   The [facility] providers would like to encourage you to use [facility] to communicate with providers for non-urgent requests or questions.  Due to long hold times on the telephone, sending your provider a message by [facility] may be a faster and more efficient way to get a response.  Please allow 48 business hours for a response.  Please remember that this is for non-urgent requests.   Thank you for entrusting me with your care and for choosing [facility], Dr. Christia Reading

## 2021-11-03 NOTE — Progress Notes (Signed)
Chief Complaint: Abdominal pain   HPI : 41 year old female with history of DM, OSA, depression presents for follow up of abdominal pain  Interval History: Having N&V. Zofran has been helping. Denies hematemesis. Does have some BLE edema and abdominal swelling. Has had strep throat and mono infections in the past. Her last mono infection was about a year ago. She denies alcohol use or exposure to viral hepatitis. She did not start her Linzess medication yet. She on average has a BM once every few days.  Wt Readings from Last 3 Encounters:  11/03/21 (!) 334 lb 4 oz (151.6 kg)  09/14/21 (!) 341 lb (154.7 kg)  12/09/20 (!) 340 lb (154.2 kg)   Current Outpatient Medications  Medication Sig Dispense Refill   dapagliflozin propanediol (FARXIGA) 10 MG TABS tablet Take 10 mg by mouth daily.     Dulaglutide (TRULICITY [place]) Inject into the skin every Thursday. .75 mg  every Thursday     hydrochlorothiazide (HYDRODIURIL) 25 MG tablet Take 25 mg by mouth daily.     lisinopril (ZESTRIL) 20 MG tablet Take 20 mg by mouth daily.     ondansetron (ZOFRAN) 8 MG tablet Take 1 tablet (8 mg total) by mouth 3 (three) times daily as needed for nausea or vomiting. 60 tablet 3   pantoprazole (PROTONIX) 40 MG tablet Take 40 mg by mouth daily.     rizatriptan (MAXALT) 10 MG tablet Take 10 mg by mouth as needed for migraine. May repeat in 2 hours if needed     rosuvastatin (CRESTOR) 10 MG tablet Take 10 mg by mouth at bedtime.     linaclotide (LINZESS) 145 MCG CAPS capsule Take 1 capsule (145 mcg total) by mouth daily before breakfast. (Patient not taking: Reported on 11/03/2021) 90 capsule 3   No current facility-administered medications for this visit.   Review of Systems: All systems reviewed and negative except where noted in HPI.   Physical Exam: BP 140/70 (BP Location: Left Arm, Patient Position: Sitting, Cuff Size: Large)   Pulse 100   Ht 5' 5.5" (1.664 m) Comment: height measured without shoes  Wt (!) 334  lb 4 oz (151.6 kg)   LMP 08/31/2015   BMI 54.78 kg/m  Constitutional: Pleasant,well-developed, female in no acute distress. HEENT: Normocephalic and atraumatic. Conjunctivae are normal. No scleral icterus. Cardiovascular: Normal rate, regular rhythm.  Pulmonary/chest: Effort normal and breath sounds normal. No wheezing, rales or rhonchi. Abdominal: Soft, nondistended, non-tender. Bowel sounds active throughout. There are no masses palpable. No hepatomegaly. Extremities: Trace BLE edema Neurological: Alert and oriented to person place and time. Skin: Skin is warm and dry. No rashes noted. Psychiatric: Normal mood and affect. Behavior is normal.  Labs 11/2020: CBC with elevated WBC of 11.1. BMP unremarkable  Labs 08/2021: CBC with elevated WBC of 13.1. CMP with nml LFTs and mildly elevated glucose of 143. Lipase nml. TSH nml. CRP is elevated at 38.7.  CTA A/P w/contrast 08/05/17: 1. No evidence of abdominal aortic atherosclerosis, dissection or aneurysm. 2. No acute abnormalities involving the abdomen or pelvis. Large colonic stool burden. 3. 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 4. No evidence of recurrent VENTRAL hernia after prior repair.  Pelvic U/S 06/05/19: IMPRESSION: Status post hysterectomy. The ovaries are not visualized and appeared within normal limits on recent CT.  CT A/P w/o contrast 06/05/19: IMPRESSION: There again noted changes of bowel malrotation  with the cecum identified in the midline in the majority of the small bowel seen within the right abdomen. These changes are stable from the prior exam. No obstructive changes are seen. No acute abnormality noted  CT A/P w/contrast 10/01/21: IMPRESSION: 1. No suspicious CT etiology for abdominal pain is identified. 2. Hepatic steatosis with morphologic changes which could reflect underlying cirrhosis. 3. Mild  splenomegaly.  ASSESSMENT AND PLAN: N&V Lower abdominal pain Constipation Obesity Elevated CRP and WBC Epigastric and lower abdominal pain Fatty liver, possible cirrhosis on imaging Mild splenomegaly Patient presents for follow up of constipation, lower abdominal pain, and N&V over the last year. Her symptoms may be due to underlying constipation. Will see if Linzess helps with her symptoms. CT A/P did reveal fatty liver with possible cirrhosis and mild splenomegaly. Due to concern for possible cirrhosis, will perform a liver work up today and perform elastography to get a better idea of the patient's liver stiffness. Patient had elevated CRP on last set of lab checks, which may be due to underlying infection or inflammation. Will plan to recheck her CRP today. Patient is already scheduled for EGD/colonoscopy - Zofran PRN - Aim to drink 8 cups, walk 30 minutes per day, daily fiber supplement - Start Linzess 145 mcg QD - Check CRP, INR, ANA, ASMA, AMA, IgG, hepatitis A antibody, hep B surface antibody and antigen, Hep C antibody, ferritin, iron/TIBC, alpha-1 antitrypsin, ceruloplasmin - Check elastography - Referral bariatric surgery - Already scheduled for EGD/colonoscopy [facility] in 10/2021  Christia Reading, MD  I spent 43 minutes of time, including in depth chart review, independent review of results as outlined above, communicating results with the patient directly, face-to-face time with the patient, coordinating care, ordering studies and medications as appropriate, and documentation.

## 2021-11-05 LAB — ALPHA-1-ANTITRYPSIN: A-1 Antitrypsin, Ser: 166 mg/dL (ref 83–199)

## 2021-11-05 LAB — CERULOPLASMIN: Ceruloplasmin: 33 mg/dL (ref 18–53)

## 2021-11-05 LAB — HEPATITIS C ANTIBODY: Hepatitis C Ab: NONREACTIVE

## 2021-11-05 LAB — HEPATITIS B SURFACE ANTIGEN: Hepatitis B Surface Ag: NONREACTIVE

## 2021-11-05 LAB — HEPATITIS A ANTIBODY, TOTAL: Hepatitis A AB,Total: NONREACTIVE

## 2021-11-05 LAB — ANTI-SMOOTH MUSCLE ANTIBODY, IGG: Actin (Smooth Muscle) Antibody (IGG): <20 U (ref <20)

## 2021-11-05 LAB — ANA: Anti Nuclear Antibody (ANA): NEGATIVE

## 2021-11-05 LAB — MITOCHONDRIAL ANTIBODIES: Mitochondrial M2 Ab, IgG: <=20 U (ref <=20.0)

## 2021-11-05 LAB — HEPATITIS B SURFACE ANTIBODY,QUALITATIVE: Hep B S Ab: NONREACTIVE

## 2021-11-05 LAB — IGG: IgG (Immunoglobin G), Serum: 750 mg/dL (ref 600–1640)

## 2021-11-08 DIAGNOSIS — I10 Essential (primary) hypertension: Secondary | ICD-10-CM | POA: Diagnosis not present

## 2021-11-08 DIAGNOSIS — E7800 Pure hypercholesterolemia, unspecified: Secondary | ICD-10-CM | POA: Diagnosis not present

## 2021-11-08 DIAGNOSIS — E1165 Type 2 diabetes mellitus with hyperglycemia: Secondary | ICD-10-CM | POA: Diagnosis not present

## 2021-11-08 DIAGNOSIS — R600 Localized edema: Secondary | ICD-10-CM | POA: Diagnosis not present

## 2021-11-15 ENCOUNTER — Encounter (HOSPITAL_COMMUNITY): Payer: Self-pay | Admitting: Internal Medicine

## 2021-11-15 NOTE — Progress Notes (Signed)
Attempted to obtain medical history via telephone, unable to reach at this time. HIPAA compliant voicemail message left requesting return call to pre surgical testing department. 

## 2021-11-16 ENCOUNTER — Ambulatory Visit (HOSPITAL_COMMUNITY)
Admission: RE | Admit: 2021-11-16 | Discharge: 2021-11-16 | Disposition: A | Discharge status: Home / Self Care | Payer: BC Managed Care – PPO | Source: Ambulatory Visit | Attending: Internal Medicine | Admitting: Internal Medicine

## 2021-11-16 DIAGNOSIS — R161 Splenomegaly, not elsewhere classified: Secondary | ICD-10-CM | POA: Insufficient documentation

## 2021-11-16 DIAGNOSIS — K760 Fatty (change of) liver, not elsewhere classified: Secondary | ICD-10-CM | POA: Insufficient documentation

## 2021-11-16 DIAGNOSIS — R112 Nausea with vomiting, unspecified: Secondary | ICD-10-CM | POA: Insufficient documentation

## 2021-11-16 DIAGNOSIS — R1030 Lower abdominal pain, unspecified: Secondary | ICD-10-CM | POA: Insufficient documentation

## 2021-11-16 DIAGNOSIS — K5900 Constipation, unspecified: Secondary | ICD-10-CM | POA: Insufficient documentation

## 2021-11-22 ENCOUNTER — Ambulatory Visit (HOSPITAL_COMMUNITY): Payer: BC Managed Care – PPO | Admitting: Anesthesiology

## 2021-11-22 ENCOUNTER — Encounter (HOSPITAL_COMMUNITY)
Admission: RE | Disposition: A | Discharge status: Home / Self Care | Payer: Self-pay | Source: Ambulatory Visit | Attending: Internal Medicine

## 2021-11-22 ENCOUNTER — Encounter (HOSPITAL_COMMUNITY): Payer: Self-pay | Admitting: Internal Medicine

## 2021-11-22 ENCOUNTER — Ambulatory Visit (HOSPITAL_COMMUNITY)
Admission: RE | Admit: 2021-11-22 | Discharge: 2021-11-22 | Disposition: A | Discharge status: Home / Self Care | Payer: BC Managed Care – PPO | Source: Ambulatory Visit | Attending: Internal Medicine | Admitting: Internal Medicine

## 2021-11-22 DIAGNOSIS — K317 Polyp of stomach and duodenum: Secondary | ICD-10-CM | POA: Diagnosis not present

## 2021-11-22 DIAGNOSIS — R1030 Lower abdominal pain, unspecified: Secondary | ICD-10-CM

## 2021-11-22 DIAGNOSIS — I10 Essential (primary) hypertension: Secondary | ICD-10-CM | POA: Diagnosis not present

## 2021-11-22 DIAGNOSIS — R1310 Dysphagia, unspecified: Secondary | ICD-10-CM | POA: Diagnosis not present

## 2021-11-22 DIAGNOSIS — D123 Benign neoplasm of transverse colon: Secondary | ICD-10-CM | POA: Insufficient documentation

## 2021-11-22 DIAGNOSIS — R194 Change in bowel habit: Secondary | ICD-10-CM

## 2021-11-22 DIAGNOSIS — K2970 Gastritis, unspecified, without bleeding: Secondary | ICD-10-CM | POA: Insufficient documentation

## 2021-11-22 DIAGNOSIS — R1013 Epigastric pain: Secondary | ICD-10-CM | POA: Diagnosis not present

## 2021-11-22 DIAGNOSIS — D120 Benign neoplasm of cecum: Secondary | ICD-10-CM | POA: Diagnosis not present

## 2021-11-22 DIAGNOSIS — Z87891 Personal history of nicotine dependence: Secondary | ICD-10-CM | POA: Diagnosis not present

## 2021-11-22 DIAGNOSIS — K648 Other hemorrhoids: Secondary | ICD-10-CM | POA: Diagnosis not present

## 2021-11-22 DIAGNOSIS — K5900 Constipation, unspecified: Secondary | ICD-10-CM

## 2021-11-22 DIAGNOSIS — Z6841 Body Mass Index (BMI) 40.0 and over, adult: Secondary | ICD-10-CM | POA: Diagnosis not present

## 2021-11-22 DIAGNOSIS — K635 Polyp of colon: Secondary | ICD-10-CM | POA: Diagnosis not present

## 2021-11-22 DIAGNOSIS — K2270 Barrett's esophagus without dysplasia: Secondary | ICD-10-CM | POA: Diagnosis not present

## 2021-11-22 DIAGNOSIS — R110 Nausea: Secondary | ICD-10-CM

## 2021-11-22 DIAGNOSIS — E119 Type 2 diabetes mellitus without complications: Secondary | ICD-10-CM | POA: Insufficient documentation

## 2021-11-22 DIAGNOSIS — D122 Benign neoplasm of ascending colon: Secondary | ICD-10-CM

## 2021-11-22 DIAGNOSIS — G4730 Sleep apnea, unspecified: Secondary | ICD-10-CM | POA: Diagnosis not present

## 2021-11-22 DIAGNOSIS — Z8379 Family history of other diseases of the digestive system: Secondary | ICD-10-CM

## 2021-11-22 HISTORY — PX: POLYPECTOMY: SHX5525

## 2021-11-22 HISTORY — PX: BIOPSY: SHX5522

## 2021-11-22 HISTORY — PX: COLONOSCOPY WITH PROPOFOL: SHX5780

## 2021-11-22 HISTORY — PX: ESOPHAGOGASTRODUODENOSCOPY (EGD) WITH PROPOFOL: SHX5813

## 2021-11-22 LAB — GLUCOSE, CAPILLARY: Glucose-Capillary: 119 mg/dL — ABNORMAL HIGH (ref 70–99)

## 2021-11-22 SURGERY — COLONOSCOPY WITH PROPOFOL
Anesthesia: Monitor Anesthesia Care

## 2021-11-22 MED ORDER — LACTATED RINGERS IV SOLN
INTRAVENOUS | Status: DC
  Administered 2021-11-22: 1000 mL via INTRAVENOUS

## 2021-11-22 MED ORDER — PROPOFOL 500 MG/50ML IV EMUL
INTRAVENOUS | Status: AC
  Filled 2021-11-22: qty 50

## 2021-11-22 MED ORDER — PROPOFOL 10 MG/ML IV BOLUS
INTRAVENOUS | Status: AC
  Filled 2021-11-22: qty 20

## 2021-11-22 MED ORDER — SODIUM CHLORIDE 0.9 % IV SOLN: INTRAVENOUS | Status: DC

## 2021-11-22 MED ORDER — PANTOPRAZOLE SODIUM 40 MG PO TBEC: 40.0000 mg | DELAYED_RELEASE_TABLET | Freq: Two times a day (BID) | ORAL | 1 refills | Status: DC

## 2021-11-22 MED ORDER — PROPOFOL 500 MG/50ML IV EMUL
INTRAVENOUS | Status: DC | PRN
  Administered 2021-11-22: 60 mg via INTRAVENOUS
  Administered 2021-11-22: 140 mg via INTRAVENOUS
  Administered 2021-11-22: 155 ug/kg/min via INTRAVENOUS
  Administered 2021-11-22: 10 mg via INTRAVENOUS

## 2021-11-22 MED ORDER — LIDOCAINE HCL (CARDIAC) PF 100 MG/5ML IV SOSY
PREFILLED_SYRINGE | INTRAVENOUS | Status: DC | PRN
  Administered 2021-11-22: 100 mg via INTRATRACHEAL

## 2021-11-22 SURGICAL SUPPLY — 25 items

## 2021-11-22 NOTE — Anesthesia Preprocedure Evaluation (Signed)
Anesthesia Evaluation  Patient identified by MRN, date of birth, ID band Patient awake    Reviewed: Allergy & Precautions, NPO status , Patient's Chart, lab work & pertinent test results  History of Anesthesia Complications (+) PONV and history of anesthetic complications  Airway Mallampati: I  TM Distance: >3 FB Neck ROM: Full    Dental  (+) Edentulous Upper, Dental Advisory Given,    Pulmonary shortness of breath, sleep apnea and Continuous Positive Airway Pressure Ventilation , former smoker,    breath sounds clear to auscultation       Cardiovascular hypertension, Pt. on medications (-) angina(-) Past MI and (-) CHF  Rhythm:Regular  - Left ventricle: Normal GLS -18.6. The cavity size was normal.  Wall thickness was increased in a pattern of mild LVH. Systolic  function was normal. The estimated ejection fraction was in the  range of 60% to 65%. Wall motion was normal; there were no  regional wall motion abnormalities. Doppler parameters are  consistent with both elevated ventricular end-diastolic filling  pressure and elevated left atrial filling pressure.  - Left atrium: The atrium was mildly dilated.  - Atrial septum: No defect or patent foramen ovale was identified.    Neuro/Psych  Headaches, PSYCHIATRIC DISORDERS Anxiety Depression  Neuromuscular disease    GI/Hepatic negative GI ROS, Neg liver ROS,   Endo/Other  diabetesMorbid obesity  Renal/GU Lab Results      Component                Value               Date                      CREATININE               0.64                09/14/2021                Musculoskeletal negative musculoskeletal ROS (+)   Abdominal   Peds  Hematology negative hematology ROS (+) Lab Results      Component                Value               Date                      WBC                      13.1 (H)            09/14/2021                HGB                      13.6                 09/14/2021                HCT                      42.5                09/14/2021                MCV  79.0                09/14/2021                PLT                      249.0               09/14/2021              Anesthesia Other Findings   Reproductive/Obstetrics                             Anesthesia Physical Anesthesia Plan  ASA: 3  Anesthesia Plan: MAC   Post-op Pain Management: Minimal or no pain anticipated   Induction: Intravenous  PONV Risk Score and Plan: 3 and Propofol infusion and Treatment may vary due to age or medical condition  Airway Management Planned: Nasal Cannula  Additional Equipment: None  Intra-op Plan:   Post-operative Plan:   Informed Consent: I have reviewed the patients History and Physical, chart, labs and discussed the procedure including the risks, benefits and alternatives for the proposed anesthesia with the patient or authorized representative who has indicated his/her understanding and acceptance.     Dental advisory given  Plan Discussed with: CRNA  Anesthesia Plan Comments:         Anesthesia Quick Evaluation

## 2021-11-22 NOTE — Transfer of Care (Signed)
Immediate Anesthesia Transfer of Care Note  Patient: Kathleen Walsh  Procedure(s) Performed: COLONOSCOPY WITH PROPOFOL ESOPHAGOGASTRODUODENOSCOPY (EGD) WITH PROPOFOL BIOPSY POLYPECTOMY  Patient Location: PACU  Anesthesia Type:MAC  Level of Consciousness: awake, alert  and oriented  Airway & Oxygen Therapy: Patient Spontanous Breathing and Patient connected to face mask oxygen  Post-op Assessment: Report given to RN, Post -op Vital signs reviewed and stable and Patient moving all extremities X 4  Post vital signs: Reviewed and stable  Last Vitals:  Vitals Value Taken Time  BP 125/95   Temp    Pulse 91   Resp 13   SpO2 100     Last Pain:  Vitals:   11/22/21 0814  TempSrc: Oral  PainSc: 0-No pain         Complications: No notable events documented.

## 2021-11-22 NOTE — Discharge Instructions (Signed)
YOU HAD AN ENDOSCOPIC PROCEDURE TODAY: Refer to the procedure report and other information in the discharge instructions given to you for any specific questions about what was found during the examination. If this information does not answer your questions, please call [place] office at [phone] to clarify.  ° °YOU SHOULD EXPECT: Some feelings of bloating in the abdomen. Passage of more gas than usual. Walking can help get rid of the air that was put into your GI tract during the procedure and reduce the bloating. If you had a lower endoscopy (such as a colonoscopy or flexible sigmoidoscopy) you may notice spotting of blood in your stool or on the toilet paper. Some abdominal soreness may be present for a day or two, also. ° °DIET: Your first meal following the procedure should be a light meal and then it is ok to progress to your normal diet. A half-sandwich or bowl of soup is an example of a good first meal. Heavy or fried foods are harder to digest and may make you feel nauseous or bloated. Drink plenty of fluids but you should avoid alcoholic beverages for 24 hours. If you had a esophageal dilation, please see attached instructions for diet.   ° °ACTIVITY: Your care partner should take you home directly after the procedure. You should plan to take it easy, moving slowly for the rest of the day. You can resume normal activity the day after the procedure however YOU SHOULD NOT DRIVE, use power tools, machinery or perform tasks that involve climbing or major physical exertion for 24 hours (because of the sedation medicines used during the test).  ° °SYMPTOMS TO REPORT IMMEDIATELY: °A gastroenterologist can be reached at any hour. Please call [phone]  for any of the following symptoms:  °Following lower endoscopy (colonoscopy, flexible sigmoidoscopy) °Excessive amounts of blood in the stool  °Significant tenderness, worsening of abdominal pains  °Swelling of the abdomen that is new, acute  °Fever of 100° or  higher  °Following upper endoscopy (EGD, EUS, ERCP, esophageal dilation) °Vomiting of blood or coffee ground material  °New, significant abdominal pain  °New, significant chest pain or pain under the shoulder blades  °Painful or persistently difficult swallowing  °New shortness of breath  °Black, tarry-looking or red, bloody stools ° °FOLLOW UP:  °If any biopsies were taken you will be contacted by phone or by letter within the next 1-3 weeks. Call [phone]  if you have not heard about the biopsies in 3 weeks.  °Please also call with any specific questions about appointments or follow up tests. ° °

## 2021-11-22 NOTE — Op Note (Signed)
[facility] Patient Name: Kathleen Walsh Procedure Date: 11/22/2021 MRN: [id number] Attending MD: Georgian Co ,  Date of Birth: [birth year] [name]: [id number] Age: 41 Admit Type: Outpatient Procedure:                Upper GI endoscopy Indications:              Epigastric abdominal pain, Nausea with vomiting Providers:                Adline Mango" Georga Hacking, RN, Despina Pole, Technician Referring MD:             [facility] Medicines:                Monitored Anesthesia Care Complications:            No immediate complications. Estimated Blood Loss:     Estimated blood loss was minimal. Procedure:                Pre-Anesthesia Assessment:                           - Prior to the procedure, a History and Physical                            was performed, and patient medications and                            allergies were reviewed. The patient's tolerance of                            previous anesthesia was also reviewed. The risks                            and benefits of the procedure and the sedation                            options and risks were discussed with the patient.                            All questions were answered, and informed consent                            was obtained. Prior Anticoagulants: The patient has                            taken no previous anticoagulant or antiplatelet                            agents. ASA Grade Assessment: III - A patient with                            severe systemic disease. After reviewing the risks  and benefits, the patient was deemed in                            satisfactory condition to undergo the procedure.                           After obtaining informed consent, the endoscope was                            passed under direct vision. Throughout the                            procedure, the patient's blood  pressure, pulse, and                            oxygen saturations were monitored continuously. The                            [id number]-[id number] ([id number]) Olympus endoscope was introduced                            through the mouth, and advanced to the second part                            of duodenum. The upper GI endoscopy was                            accomplished without difficulty. The patient                            tolerated the procedure well. Scope In: Scope Out: Findings:      The examined esophagus was normal.      Localized inflammation characterized by congestion (edema) and erythema       was found in the gastric antrum. Biopsies were taken with a cold forceps       for histology.      Six 3 to 8 mm sessile polyps with no bleeding and no stigmata of recent       bleeding were found in the gastric body. These polyps were removed with       a cold snare. Resection and retrieval were complete.      The examined duodenum was normal. Biopsies were taken with a cold       forceps for histology. Impression:               - Normal esophagus.                           - Gastritis. Biopsied.                           - Six gastric polyps. Resected and retrieved.                           - Normal examined duodenum. Biopsied. Moderate Sedation:      Not Applicable - Patient had care per Anesthesia. Recommendation:           -  Use Protonix (pantoprazole) 40 mg PO BID for 8                            weeks.                           - Await pathology results.                           - Perform a colonoscopy today. Procedure Code(s):        --- Professional ---                           [phone], Esophagogastroduodenoscopy, flexible,                            transoral; with removal of tumor(s), polyp(s), or                            other lesion(s) by snare technique                           43239, 41, Esophagogastroduodenoscopy, flexible,                            transoral; with  biopsy, single or multiple Diagnosis Code(s):        --- Professional ---                           K29.70, Gastritis, unspecified, without bleeding                           K31.7, Polyp of stomach and duodenum                           R10.13, Epigastric pain CPT copyright 2019 American Medical Association. All rights reserved. The codes documented in this report are preliminary and upon coder review may  be revised to meet current compliance requirements. Sonny Masters "Christia Reading,  11/22/2021 10:59:39 AM Number of Addenda: 0

## 2021-11-22 NOTE — Op Note (Addendum)
[facility] Patient Name: Kathleen Walsh Procedure Date: 11/22/2021 MRN: [id number] Attending MD: Georgian Co ,  Date of Birth: 09-02-1980 [name]: [id number] Age: 41 Admit Type: Outpatient Procedure:                Colonoscopy Indications:              Change in bowel habits Providers:                Sonny Masters "Christia Reading Referring MD:             [facility] Medicines:                Monitored Anesthesia Care Complications:            No immediate complications. Estimated Blood Loss:     Estimated blood loss was minimal. Procedure:                Pre-Anesthesia Assessment:                           - Prior to the procedure, a History and Physical                            was performed, and patient medications and                            allergies were reviewed. The patient's tolerance of                            previous anesthesia was also reviewed. The risks                            and benefits of the procedure and the sedation                            options and risks were discussed with the patient.                            All questions were answered, and informed consent                            was obtained. Prior Anticoagulants: The patient has                            taken no previous anticoagulant or antiplatelet                            agents. ASA Grade Assessment: III - A patient with                            severe systemic disease. After reviewing the risks                            and benefits, the patient was deemed in  satisfactory condition to undergo the procedure.                           After obtaining informed consent, the colonoscope                            was passed under direct vision. Throughout the                            procedure, the patient's blood pressure, pulse, and                            oxygen saturations were monitored continuously. The                             [id number]-HQ190L ([id number]) Olympus colonoscope was                            introduced through the anus and advanced to the the                            terminal ileum. The colonoscopy was performed                            without difficulty. The patient tolerated the                            procedure well. The quality of the bowel                            preparation was good. The terminal ileum, ileocecal                            valve, appendiceal orifice, and rectum were                            photographed. Scope In: 10:25:52 AM Scope Out: 10:50:30 AM Total Procedure Duration: 0 hours 24 minutes 38 seconds  Findings:      The terminal ileum appeared normal.      Two sessile polyps were found in the ascending colon and cecum. The       polyps were 3 to 6 mm in size. These polyps were removed with a cold       snare. Resection and retrieval were complete.      A 2 mm polyp was found in the transverse colon. The polyp was sessile.       The polyp was removed with a cold biopsy forceps. Resection and       retrieval were complete.      A 4 mm polyp was found in the transverse colon. The polyp was sessile.       The polyp was removed with a cold snare. Resection and retrieval were       complete.      Non-bleeding internal hemorrhoids were found during retroflexion. Impression:               - The  examined portion of the ileum was normal.                           - Two 3 to 6 mm polyps in the ascending colon and                            in the cecum, removed with a cold snare. Resected                            and retrieved.                           - One 2 mm polyp in the transverse colon, removed                            with a cold biopsy forceps. Resected and retrieved.                           - One 4 mm polyp in the transverse colon, removed                            with a cold snare. Resected and retrieved.                           -  Non-bleeding internal hemorrhoids. Moderate Sedation:      Not Applicable - Patient had care per Anesthesia. Recommendation:           - Discharge patient to home (with escort).                           - Await pathology results.                           - The findings and recommendations were discussed                            with the patient.                           - Return to GI clinic in 6 weeks. Procedure Code(s):        --- Professional ---                           [phone], Colonoscopy, flexible; with removal of                            tumor(s), polyp(s), or other lesion(s) by snare                            technique Diagnosis Code(s):        --- Professional ---                           K64.8, Other hemorrhoids  K63.5, Polyp of colon                           R19.4, Change in bowel habit CPT copyright 2019 American Medical Association. All rights reserved. The codes documented in this report are preliminary and upon coder review may  be revised to meet current compliance requirements. Sonny Masters "Christia Reading,  11/22/2021 11:04:30 AM Number of Addenda: 0

## 2021-11-22 NOTE — H&P (Signed)
GASTROENTEROLOGY PROCEDURE H&P NOTE   Primary Care Physician: Practice, [facility]    Reason for Procedure:   N&V, epigastric and lower abdominal pain, changes in bowel habits  Plan:    EGD/colonoscopy  Patient is appropriate for endoscopic procedure(s) in the hospital setting.  The nature of the procedure, as well as the risks, benefits, and alternatives were carefully and thoroughly reviewed with the patient. Ample time for discussion and questions allowed. The patient understood, was satisfied, and agreed to proceed.     HPI: Kathleen Walsh is a 41 y.o. female who presents for EGD/colonoscopy for evaluation of N&V, epigastric ab pain, and change in bowel habits .  Patient was most recently seen in the Gastroenterology Clinic on 11/03/21.  No interval change in medical history since that appointment. Please refer to that note for full details regarding GI history and clinical presentation.   Past Medical History:  Diagnosis Date   Abdominal wall pain    Anemia    Anxiety    Blood dyscrasia    lupus anticoagulant during pregnancy   Complication of anesthesia    pt states became " panicky" after left shoulder surgery 10/2019   Depression    takes cymbalta    Diabetes mellitus without complication (HCC)    JIRCVELF(810.1)    migraines   Hx of lupus anticoagulant disorder    Hyperlipidemia    Hypertension    Obesity    PONV (postoperative nausea and vomiting)    Sleep apnea    USES C-PAP    Past Surgical History:  Procedure Laterality Date   ABDOMINAL HYSTERECTOMY  2016   ANKLE ARTHROSCOPY WITH REPAIR SUBLUXING TENDON Left    CESAREAN SECTION WITH BILATERAL TUBAL LIGATION Bilateral 02/11/2013   Procedure: Repeat CESAREAN SECTION WITH BILATERAL TUBAL LIGATION;  Surgeon: Lovenia Kim, MD;  Location: [facility];  Service: Obstetrics;  Laterality: Bilateral;  EDD: 03/01/13   DEBRIDEMENT OF ABDOMINAL WALL ABSCESS N/A 12/03/2014   Procedure: INCISION OF  ABDOMINAL WALL LIPOMA;  Surgeon: Greer Pickerel, MD;  Location: [facility];  Service: General;  Laterality: N/A;   DILATION AND CURETTAGE OF UTERUS  2004   DILITATION & CURRETTAGE/HYSTROSCOPY WITH NOVASURE ABLATION N/A 11/19/2014   Procedure: DILATATION & CURETTAGE/HYSTEROSCOPY WITH NOVASURE ABLATION;  Surgeon: Brien Few, MD;  Location: [facility];  Service: Gynecology;  Laterality: N/A;   ENDOMETRIAL ABLATION  10/20/2014   FOOT SURGERY  2008   HERNIA REPAIR  09/19/2011   LAPAROSCOPY N/A 12/03/2014   Procedure: LAPAROSCOPY DIAGNOSTIC WITH LYSIS OF ADHESIONS;  Surgeon: Greer Pickerel, MD;  Location: [facility];  Service: General;  Laterality: N/A;   left shoulder surgery      NASAL SEPTUM SURGERY     TUBAL LIGATION     VENTRAL HERNIA REPAIR  09/20/2010   Laparoscopic, Dr Greer Pickerel   WISDOM TOOTH EXTRACTION      Prior to Admission medications   Medication Sig Start Date End Date Taking? Authorizing Provider  dapagliflozin propanediol (FARXIGA) 10 MG TABS tablet Take 10 mg by mouth daily.   Yes [provider]  hydrochlorothiazide (HYDRODIURIL) 25 MG tablet Take 25 mg by mouth daily.   Yes [provider]  levocetirizine (XYZAL) 5 MG tablet Take 5 mg by mouth daily as needed for allergies.   Yes [provider]  linaclotide Rolan Lipa) 145 MCG CAPS capsule Take 1 capsule (145 mcg total) by mouth daily before breakfast. 09/14/21  Yes Sharyn Creamer, MD  lisinopril (ZESTRIL)  20 MG tablet Take 20 mg by mouth daily.   Yes [provider]  ondansetron (ZOFRAN) 8 MG tablet Take 1 tablet (8 mg total) by mouth 3 (three) times daily as needed for nausea or vomiting. 09/14/21  Yes Sharyn Creamer, MD  pantoprazole (PROTONIX) 40 MG tablet Take 40 mg by mouth daily. 08/10/21  Yes [provider]  rizatriptan (MAXALT) 10 MG tablet Take 10 mg by mouth as needed for migraine. May repeat in 2 hours if needed   Yes [provider]  rosuvastatin (CRESTOR) 20 MG tablet Take  20 mg by mouth daily.   Yes [provider]  Simethicone (GAS-X PO) Take 1 tablet by mouth daily as needed (gas).   Yes [provider]  TRULICITY 3 AL/9.3XT SOPN Inject 3 mg into the skin every Saturday. 11/09/21  Yes [provider]    Current Facility-Administered Medications  Medication Dose Route Frequency Provider Last Rate Last Admin   0.9 %  sodium chloride infusion   Intravenous Continuous Sharyn Creamer, MD       lactated ringers infusion   Intravenous Continuous Sharyn Creamer, MD 10 mL/hr at 11/22/21 0829 1,000 mL at 11/22/21 0829    Allergies as of 09/14/2021 - Review Complete 09/14/2021  Allergen Reaction Noted   Amoxicillin Nausea And Vomiting 11/23/2015    Family History  Problem Relation Age of Onset   Diabetes Mother    Hypertension Mother    Heart disease Mother    Diabetes Father    Hypertension Father    Colon polyps Father    Colitis Father    Irritable bowel syndrome Father    Breast cancer Maternal Grandmother    Crohn's disease Maternal Grandmother    Prostate cancer Maternal Grandfather    Clotting disorder Paternal Grandmother    Leukemia Paternal Grandfather    Irritable bowel syndrome Daughter    Stomach cancer Neg Hx    Esophageal cancer Neg Hx     Social History   Socioeconomic History   Marital status: Married    Spouse name: Not on file   Number of children: 2   Years of education: Not on file   Highest education level: Not on file  Occupational History   Not on file  Tobacco Use   Smoking status: Former    Packs/day: 0.75    Years: 23.00    Total pack years: 17.25    Types: Cigarettes    Start date: 05/2017   Smokeless tobacco: Never  Vaping Use   Vaping Use: Never used  Substance and Sexual Activity   Alcohol use: No    Comment: "once every three years"    Drug use: No   Sexual activity: Yes    Birth control/protection: Surgical  Other Topics Concern   Not on file  Social History Narrative    Not on file   Social Determinants of Health   Financial Resource Strain: Not on file  Food Insecurity: Not on file  Transportation Needs: Not on file  Physical Activity: Not on file  Stress: Not on file  Social Connections: Not on file  Intimate Partner Violence: Not on file    Physical Exam: Vital signs in last 24 hours: BP (!) 162/89   Pulse 81   Temp 98.2 F (36.8 C) (Oral)   Resp 16   Ht '5\' 5"'$  (1.651 m)   Wt (!) 151.1 kg   LMP 08/31/2015   SpO2 98%   BMI  55.43 kg/m  GEN: NAD EYE: Sclerae anicteric ENT: MMM CV: Non-tachycardic Pulm: No increased WOB GI: Soft NEURO:  Alert & Oriented   Christia Reading, MD [facility]   11/22/2021 8:59 AM

## 2021-11-23 LAB — SURGICAL PATHOLOGY

## 2021-11-24 ENCOUNTER — Encounter (HOSPITAL_COMMUNITY): Payer: Self-pay | Admitting: Internal Medicine

## 2021-11-24 NOTE — Anesthesia Postprocedure Evaluation (Signed)
Anesthesia Post Note  Patient: Kathleen Walsh  Procedure(s) Performed: COLONOSCOPY WITH PROPOFOL ESOPHAGOGASTRODUODENOSCOPY (EGD) WITH PROPOFOL BIOPSY POLYPECTOMY     Patient location during evaluation: Endoscopy Anesthesia Type: MAC Level of consciousness: awake and alert Pain management: pain level controlled Vital Signs Assessment: post-procedure vital signs reviewed and stable Respiratory status: spontaneous breathing, nonlabored ventilation and respiratory function stable Cardiovascular status: stable and blood pressure returned to baseline Postop Assessment: no apparent nausea or vomiting Anesthetic complications: no   No notable events documented.  Last Vitals:  Vitals:   11/22/21 1110 11/22/21 1120  BP: 140/71 137/74  Pulse: 89 85  Resp: 16 (!) 26  Temp:    SpO2: 100% 98%    Last Pain:  Vitals:   11/22/21 1120  TempSrc:   PainSc: 0-No pain                  

## 2021-11-25 ENCOUNTER — Telehealth: Payer: Self-pay | Admitting: Internal Medicine

## 2021-11-25 ENCOUNTER — Other Ambulatory Visit: Payer: Self-pay

## 2021-11-25 ENCOUNTER — Encounter (HOSPITAL_BASED_OUTPATIENT_CLINIC_OR_DEPARTMENT_OTHER): Payer: Self-pay | Admitting: Emergency Medicine

## 2021-11-25 ENCOUNTER — Emergency Department (HOSPITAL_BASED_OUTPATIENT_CLINIC_OR_DEPARTMENT_OTHER): Payer: BC Managed Care – PPO

## 2021-11-25 ENCOUNTER — Emergency Department (HOSPITAL_BASED_OUTPATIENT_CLINIC_OR_DEPARTMENT_OTHER)
Admission: EM | Admit: 2021-11-25 | Discharge: 2021-11-25 | Disposition: A | Discharge status: Home / Self Care | Payer: BC Managed Care – PPO | Attending: Emergency Medicine | Admitting: Emergency Medicine

## 2021-11-25 DIAGNOSIS — D72829 Elevated white blood cell count, unspecified: Secondary | ICD-10-CM | POA: Diagnosis not present

## 2021-11-25 DIAGNOSIS — R112 Nausea with vomiting, unspecified: Secondary | ICD-10-CM | POA: Diagnosis not present

## 2021-11-25 DIAGNOSIS — R197 Diarrhea, unspecified: Secondary | ICD-10-CM | POA: Diagnosis not present

## 2021-11-25 DIAGNOSIS — E119 Type 2 diabetes mellitus without complications: Secondary | ICD-10-CM | POA: Diagnosis not present

## 2021-11-25 DIAGNOSIS — K760 Fatty (change of) liver, not elsewhere classified: Secondary | ICD-10-CM | POA: Diagnosis not present

## 2021-11-25 DIAGNOSIS — R918 Other nonspecific abnormal finding of lung field: Secondary | ICD-10-CM | POA: Diagnosis not present

## 2021-11-25 LAB — TROPONIN I (HIGH SENSITIVITY): Troponin I (High Sensitivity): <2 ng/L (ref <18)

## 2021-11-25 LAB — URINALYSIS, ROUTINE W REFLEX MICROSCOPIC
Bilirubin Urine: NEGATIVE
Glucose, UA: 500 mg/dL — AB
Hgb urine dipstick: NEGATIVE
Ketones, ur: 15 mg/dL — AB
Leukocytes,Ua: NEGATIVE
Nitrite: NEGATIVE
Protein, ur: 30 mg/dL — AB
Specific Gravity, Urine: >1.046 — ABNORMAL HIGH (ref 1.005–1.030)
pH: 6 (ref 5.0–8.0)

## 2021-11-25 LAB — CBC WITH DIFFERENTIAL/PLATELET
Abs Immature Granulocytes: 0.08 10*3/uL — ABNORMAL HIGH (ref 0.00–0.07)
Basophils Absolute: 0.1 10*3/uL (ref 0.0–0.1)
Basophils Relative: 0 %
Eosinophils Absolute: 0.1 10*3/uL (ref 0.0–0.5)
Eosinophils Relative: 1 %
HCT: 47.9 % — ABNORMAL HIGH (ref 36.0–46.0)
Hemoglobin: 15.3 g/dL — ABNORMAL HIGH (ref 12.0–15.0)
Immature Granulocytes: 1 %
Lymphocytes Relative: 8 %
Lymphs Abs: 1.2 10*3/uL (ref 0.7–4.0)
MCH: 25.8 pg — ABNORMAL LOW (ref 26.0–34.0)
MCHC: 31.9 g/dL (ref 30.0–36.0)
MCV: 80.6 fL (ref 80.0–100.0)
Monocytes Absolute: 0.6 10*3/uL (ref 0.1–1.0)
Monocytes Relative: 4 %
Neutro Abs: 13.7 10*3/uL — ABNORMAL HIGH (ref 1.7–7.7)
Neutrophils Relative %: 86 %
Platelets: 289 10*3/uL (ref 150–400)
RBC: 5.94 MIL/uL — ABNORMAL HIGH (ref 3.87–5.11)
RDW: 15.9 % — ABNORMAL HIGH (ref 11.5–15.5)
WBC: 15.7 10*3/uL — ABNORMAL HIGH (ref 4.0–10.5)
nRBC: 0 % (ref 0.0–0.2)

## 2021-11-25 LAB — COMPREHENSIVE METABOLIC PANEL
ALT: 12 U/L (ref 0–44)
AST: 9 U/L — ABNORMAL LOW (ref 15–41)
Albumin: 4.7 g/dL (ref 3.5–5.0)
Alkaline Phosphatase: 67 U/L (ref 38–126)
Anion gap: 14 (ref 5–15)
BUN: 16 mg/dL (ref 6–20)
CO2: 23 mmol/L (ref 22–32)
Calcium: 10 mg/dL (ref 8.9–10.3)
Chloride: 102 mmol/L (ref 98–111)
Creatinine, Ser: 0.61 mg/dL (ref 0.44–1.00)
GFR, Estimated: >60 mL/min (ref >60)
Glucose, Bld: 146 mg/dL — ABNORMAL HIGH (ref 70–99)
Potassium: 4 mmol/L (ref 3.5–5.1)
Sodium: 139 mmol/L (ref 135–145)
Total Bilirubin: 0.7 mg/dL (ref 0.3–1.2)
Total Protein: 7.3 g/dL (ref 6.5–8.1)

## 2021-11-25 LAB — LIPASE, BLOOD: Lipase: 19 U/L (ref 11–51)

## 2021-11-25 LAB — PREGNANCY, URINE: Preg Test, Ur: NEGATIVE

## 2021-11-25 MED ORDER — DICYCLOMINE HCL 10 MG/ML IM SOLN
20.0000 mg | Freq: Once | INTRAMUSCULAR | Status: AC
  Administered 2021-11-25: 20 mg via INTRAMUSCULAR
  Filled 2021-11-25: qty 2

## 2021-11-25 MED ORDER — SODIUM CHLORIDE 0.9 % IV BOLUS
1000.0000 mL | Freq: Once | INTRAVENOUS | Status: AC
  Administered 2021-11-25: 1000 mL via INTRAVENOUS

## 2021-11-25 MED ORDER — IOHEXOL 300 MG/ML  SOLN
100.0000 mL | Freq: Once | INTRAMUSCULAR | Status: AC | PRN
  Administered 2021-11-25: 100 mL via INTRAVENOUS

## 2021-11-25 MED ORDER — PANTOPRAZOLE SODIUM 40 MG IV SOLR
40.0000 mg | Freq: Once | INTRAVENOUS | Status: AC
  Administered 2021-11-25: 40 mg via INTRAVENOUS
  Filled 2021-11-25: qty 10

## 2021-11-25 MED ORDER — MORPHINE SULFATE (PF) 4 MG/ML IV SOLN
4.0000 mg | Freq: Once | INTRAVENOUS | Status: AC
  Administered 2021-11-25: 4 mg via INTRAVENOUS
  Filled 2021-11-25: qty 1

## 2021-11-25 MED ORDER — ONDANSETRON HCL 4 MG/2ML IJ SOLN
4.0000 mg | Freq: Once | INTRAMUSCULAR | Status: AC
  Administered 2021-11-25: 4 mg via INTRAVENOUS
  Filled 2021-11-25: qty 2

## 2021-11-25 NOTE — ED Notes (Signed)
Discharge paperwork given and understood. 

## 2021-11-25 NOTE — Telephone Encounter (Signed)
Patient had a procedure with Dr. Lorenso Courier on Monday.  Tuesday she was very weak.  Wednesday through currently she has been having nausea and vomiting.  She wanted to know if this was normal.  Please call patient and advise.  Thank you.

## 2021-11-25 NOTE — ED Notes (Signed)
Pt provided shasta twist for fluid challenge.

## 2021-11-25 NOTE — ED Triage Notes (Signed)
Pt here from home with c/o n/v/d that started yesterday , pt did have a coloscopy on Monday

## 2021-11-25 NOTE — ED Provider Notes (Signed)
[facility] EMERGENCY DEPT Provider Note   [name]: [id number] Arrival date & time: 11/25/21  1006     History  No chief complaint on file.   Kathleen Walsh is a 41 y.o. female.  Patient with a history of obesity, sleep apnea, depression, diabetes presenting with abdominal pain, nausea vomiting and diarrhea.  She had EGD and colonoscopy on July 24 by Dr. Lorenso Courier.  This was being done for chronic constipation and nausea.  She reports she felt generally weak after her scopes but was able to eat some during the day Tuesday and Wednesday.  Wednesday night she started having diffuse vomiting and unable to keep anything down.  Also had several loose stools.  Denies any black or blood in her stool or emesis.  No fever.  Abdominal pain is to the top of her stomach and worse with palpation.  No pain with urination.  No vaginal bleeding or discharge.  Her EGD and colonoscopy showed multiple polyps throughout her colon and stomach multiple were removed.  Still has appendix and gallbladder  The history is provided by the patient.       Home Medications Prior to Admission medications   Medication Sig Start Date End Date Taking? Authorizing Provider  dapagliflozin propanediol (FARXIGA) 10 MG TABS tablet Take 10 mg by mouth daily.    [provider]  hydrochlorothiazide (HYDRODIURIL) 25 MG tablet Take 25 mg by mouth daily.    [provider]  levocetirizine (XYZAL) 5 MG tablet Take 5 mg by mouth daily as needed for allergies.    [provider]  linaclotide Rolan Lipa) 145 MCG CAPS capsule Take 1 capsule (145 mcg total) by mouth daily before breakfast. 09/14/21   Sharyn Creamer, MD  lisinopril (ZESTRIL) 20 MG tablet Take 20 mg by mouth daily.    [provider]  ondansetron (ZOFRAN) 8 MG tablet Take 1 tablet (8 mg total) by mouth 3 (three) times daily as needed for nausea or vomiting. 09/14/21   Sharyn Creamer, MD  pantoprazole (PROTONIX) 40 MG tablet Take 1  tablet (40 mg total) by mouth 2 (two) times daily before a meal. 11/22/21   Sharyn Creamer, MD  rizatriptan (MAXALT) 10 MG tablet Take 10 mg by mouth as needed for migraine. May repeat in 2 hours if needed    [provider]  rosuvastatin (CRESTOR) 20 MG tablet Take 20 mg by mouth daily.    [provider]  Simethicone (GAS-X PO) Take 1 tablet by mouth daily as needed (gas).    [provider]  TRULICITY 3 XF/8.1WE SOPN Inject 3 mg into the skin every Saturday. 11/09/21   [provider]      Allergies    Amoxicillin    Review of Systems   Review of Systems  Physical Exam Updated Vital Signs BP (!) 141/92 (BP Location: Left Wrist)   Pulse 89   Temp 98.7 F (37.1 C)   Resp 16   Ht 5' 5.5" (1.664 m)   Wt (!) 150.6 kg   LMP 08/31/2015   SpO2 97%   BMI 54.41 kg/m  Physical Exam Vitals and nursing note reviewed.  Constitutional:      General: She is in acute distress.     Appearance: She is well-developed. She is obese.  HENT:     Head: Normocephalic and atraumatic.     Mouth/Throat:     Pharynx: No oropharyngeal exudate.  Eyes:     Conjunctiva/sclera: Conjunctivae normal.  Pupils: Pupils are equal, round, and reactive to light.  Neck:     Comments: No meningismus. Cardiovascular:     Rate and Rhythm: Normal rate and regular rhythm.     Heart sounds: Normal heart sounds. No murmur heard. Pulmonary:     Effort: Pulmonary effort is normal. No respiratory distress.     Breath sounds: Normal breath sounds.  Abdominal:     Palpations: Abdomen is soft.     Tenderness: There is abdominal tenderness. There is no guarding or rebound.     Comments: Diffuse tenderness, midline tenderness epigastrium and periumbilical with voluntary guarding.  Exam limited by body habitus  Musculoskeletal:        General: No tenderness. Normal range of motion.     Cervical back: Normal range of motion and neck supple.  Skin:    General: Skin is warm.   Neurological:     Mental Status: She is alert and oriented to person, place, and time.     Cranial Nerves: No cranial nerve deficit.     Motor: No abnormal muscle tone.     Coordination: Coordination normal.     Comments:  5/5 strength throughout. CN 2-12 intact.Equal grip strength.   Psychiatric:        Behavior: Behavior normal.     ED Results / Procedures / Treatments   Labs (all labs ordered are listed, but only abnormal results are displayed) Labs Reviewed  COMPREHENSIVE METABOLIC PANEL - Abnormal; Notable for the following components:      Result Value   Glucose, Bld 146 (*)    AST 9 (*)    All other components within normal limits  CBC WITH DIFFERENTIAL/PLATELET - Abnormal; Notable for the following components:   WBC 15.7 (*)    RBC 5.94 (*)    Hemoglobin 15.3 (*)    HCT 47.9 (*)    MCH 25.8 (*)    RDW 15.9 (*)    Neutro Abs 13.7 (*)    Abs Immature Granulocytes 0.08 (*)    All other components within normal limits  URINALYSIS, ROUTINE W REFLEX MICROSCOPIC - Abnormal; Notable for the following components:   Specific Gravity, Urine >1.046 (*)    Glucose, UA 500 (*)    Ketones, ur 15 (*)    Protein, ur 30 (*)    All other components within normal limits  LIPASE, BLOOD  PREGNANCY, URINE  TROPONIN I (HIGH SENSITIVITY)  TROPONIN I (HIGH SENSITIVITY)    EKG EKG Interpretation  Date/Time:  Thursday November 25 2021 12:08:49 EDT Ventricular Rate:  90 PR Interval:  163 QRS Duration: 100 QT Interval:  387 QTC Calculation: 474 R Axis:   72 Text Interpretation: Sinus rhythm Borderline T abnormalities, anterior leads No significant change was found Confirmed by Ezequiel Essex [phone] on 11/25/2021 12:13:12 PM  Radiology CT CHEST ABDOMEN PELVIS W CONTRAST  Result Date: 11/25/2021 CLINICAL DATA:  Nausea with vomiting and abdominal pain since yesterday. Patient underwent colonoscopy 3 days ago. Evaluate for perforation. EXAM: CT CHEST, ABDOMEN, AND PELVIS WITH CONTRAST  TECHNIQUE: Multidetector CT imaging of the chest, abdomen and pelvis was performed following the standard protocol during bolus administration of intravenous contrast. RADIATION DOSE REDUCTION: This exam was performed according to the departmental dose-optimization program which includes automated exposure control, adjustment of the mA and/or kV according to patient size and/or use of iterative reconstruction technique. CONTRAST:  145m OMNIPAQUE IOHEXOL 300 MG/ML  SOLN COMPARISON:  Abdominopelvic CT 10/01/2021.  Chest CT 02/07/2018. FINDINGS: CT  CHEST FINDINGS Cardiovascular: No significant vascular findings. The heart size is normal. There is no pericardial effusion. Mediastinum/Nodes: There are no enlarged mediastinal, hilar or axillary lymph nodes. 7 mm low-density left thyroid nodule on image 6/2; No follow up recommended.(ref: J Am Coll Radiol. 2015 Feb;12(2): 143-50). The esophagus appears unremarkable. Lungs/Pleura: No pleural effusion or pneumothorax. A few scattered tiny subpleural nodules are unchanged from 2019, consistent with benign findings. The largest is noted in the left lower lobe, measuring 4 mm on image 93/4. No follow-up of these nodules recommended. No suspicious nodules. Musculoskeletal/Chest wall: No chest wall mass or suspicious osseous findings. Stable posterior osteophyte formation at T5, not causing significant spinal canal compromise. CT ABDOMEN AND PELVIS FINDINGS Hepatobiliary: Mild hepatomegaly with heterogeneous steatosis. No evidence of gallstones, gallbladder wall thickening or biliary dilatation. Pancreas: Unremarkable. No pancreatic ductal dilatation or surrounding inflammatory changes. Spleen: Unchanged low-density lesion centrally in the spleen measuring 3.1 cm on image 59/2. The spleen is normal in size without surrounding abnormality. Adrenals/Urinary Tract: Both adrenal glands appear normal. The kidneys appear normal without evidence of urinary tract calculus, suspicious  lesion or hydronephrosis. The bladder appears normal for its degree of distention. Stomach/Bowel: No enteric contrast administered. The stomach appears unremarkable for its degree of distension. No evidence of bowel wall thickening, distention or surrounding inflammatory change. The colon is predominately left-sided with the cecum located in the central abdomen. The small bowel is probably right-sided, consistent with intestinal nonrotation. The appendix appears normal. Vascular/Lymphatic: There are no enlarged abdominal or pelvic lymph nodes. No significant vascular findings. The portal, superior mesenteric and splenic veins are patent. Reproductive: Status post hysterectomy. No suspicious adnexal findings. Other: Chronic soft tissue stranding around the umbilicus attributed to previous abdominal surgery. No abdominal wall hernia, ascites or free air. Musculoskeletal: No acute or significant osseous findings. Mild lumbar spondylosis with a broad-based central disc protrusion at L4-5 which may contribute to mild spinal stenosis and lateral recess narrowing. IMPRESSION: 1. No acute findings or explanation for the patient's symptoms. No evidence of complication related to recent colonoscopy. 2. 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 3. Heterogeneous hepatic steatosis. 4. Low-density splenic lesion appears stable from chest CTA 01/26/2010, consistent with a benign finding. 5. No acute or significant chest findings. Electronically Signed   By: Richardean Sale M.D.   On: 11/25/2021 12:47    Procedures Procedures    Medications Ordered in ED Medications  sodium chloride 0.9 % bolus 1,000 mL (has no administration in time range)  ondansetron (ZOFRAN) injection 4 mg (has no administration in time range)  morphine (PF) 4 MG/ML injection 4 mg (has no administration in time range)  pantoprazole (PROTONIX) injection 40 mg (has no administration in time range)  iohexol (OMNIPAQUE) 300 MG/ML solution  100 mL (has no administration in time range)    ED Course/ Medical Decision Making/ A&P                           Medical Decision Making Amount and/or Complexity of Data Reviewed Labs: ordered. Decision-making details documented in ED Course. Radiology: ordered and independent interpretation performed. Decision-making details documented in ED Course. ECG/medicine tests: ordered and independent interpretation performed. Decision-making details documented in ED Course.  Risk Prescription drug management.   Abdominal pain, vomiting, diarrhea 3 days after EGD and colonoscopy.  Vitals are stable.  Abdomen soft but diffusely tender.  Imaging will be obtained to rule out perforation.  IV fluids and  symptom control was given.  Labs show leukocytosis of 15 which is nonspecific in the setting of her recent endoscopy.  Labs otherwise reassuring with normal LFTs, electrolytes and lipase.  hCG is negative and urinalysis is benign.  Imaging obtained to rule out perforation or other complication from her endoscopy.  This shows no perforation or bowel obstruction.  No acute findings on CT imaging.  Results reviewed and interpreted by me. D/w Dr. Lin Landsman of radiology. 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 Not related to recent colonoscopy.   Abdomen soft on recheck.  Patient tolerating p.o. without vomiting. Advised to take antiemetics as prescribed and advance her diet slowly starting with a bland diet today and clear liquids.  Follow-up with your primary doctor and gastroenterologist.  Return precautions are discussed        Final Clinical Impression(s) / ED Diagnoses Final diagnoses:  Nausea vomiting and diarrhea    Rx / DC Orders ED Discharge Orders     None         , Annie Main, MD 11/25/21 1530

## 2021-11-25 NOTE — Telephone Encounter (Signed)
Spoke with the patient. States she felt okay but no appetite on Wednesday. She ate a baked potato, she had a piece of bacon, some cheeto's and a few bites of hash browns during the day. She began vomiting later in the day. She last vomited about 45 minutes ago. She has decided to go to the emergent care on [address] (?[facility]) because she feels she has become dehydrated and she cannot stop vomiting. She is taking Pantoprazole twice daily as ordered.

## 2021-11-25 NOTE — Discharge Instructions (Signed)
Your testing is reassuring.  No evidence of complication from your recent endoscopy.  Your blood work is stable.  Take Zofran as prescribed for nausea as needed.  Advance diet slowly after starting with clear liquids today follow-up with your primary doctor and Dr. Lorenso Courier.  Return to the ED with worsening symptoms including pain, vomiting, not able to eat or drink or any other concerns.

## 2021-12-09 ENCOUNTER — Telehealth: Payer: Self-pay | Admitting: Internal Medicine

## 2021-12-09 NOTE — Telephone Encounter (Signed)
Spoke with the patient. She reports she has continued with diarrhea. Today she has gone 5 to 6 times. If she eats, she has to urgently go to move her bowels. She complains of tenesmus. She is not vomiting. Feels nauseated. Abdomen is uncomfortable. States "I just don't feel good." Confirmed she is not taking any stools softeners, not taking Linzess. GI medications are pantoprazole BID, Zofran PRN and has tried Gas-X last taken Monday 8/7. Last week she did not have diarrhea. Afebrile.

## 2021-12-09 NOTE — Telephone Encounter (Signed)
Inbound call from patient stating that she had colonoscopy and endoscopy on 7/24 and since she had the procedures she has been having vomiting and diarrhea. Patient is requesting a call to discuss. Please advise.

## 2021-12-10 ENCOUNTER — Other Ambulatory Visit: Payer: Self-pay

## 2021-12-10 ENCOUNTER — Other Ambulatory Visit: Payer: BC Managed Care – PPO

## 2021-12-10 DIAGNOSIS — R1030 Lower abdominal pain, unspecified: Secondary | ICD-10-CM

## 2021-12-10 DIAGNOSIS — R197 Diarrhea, unspecified: Secondary | ICD-10-CM

## 2021-12-10 NOTE — Telephone Encounter (Signed)
Discussed plan with the patient. She agrees to come to the lab to get the specimen containers and submit the diarrhea stool. She reports she ate some breakfast this morning and "it feels like it is sitting there in my chest." Will monitor for a sooner appointment.

## 2021-12-10 NOTE — Telephone Encounter (Signed)
Patient called back to follow up on recommendations. Please advise.

## 2021-12-14 ENCOUNTER — Other Ambulatory Visit: Payer: BC Managed Care – PPO

## 2021-12-14 DIAGNOSIS — A09 Infectious gastroenteritis and colitis, unspecified: Secondary | ICD-10-CM | POA: Diagnosis not present

## 2021-12-14 DIAGNOSIS — R197 Diarrhea, unspecified: Secondary | ICD-10-CM | POA: Diagnosis not present

## 2021-12-14 DIAGNOSIS — R1030 Lower abdominal pain, unspecified: Secondary | ICD-10-CM | POA: Diagnosis not present

## 2021-12-14 NOTE — Telephone Encounter (Signed)
Contacted the patient and moved her to 01/12/22 at 11:10 am with Dr Lorenso Courier.

## 2021-12-16 LAB — GI PROFILE, STOOL, PCR

## 2021-12-16 LAB — CLOSTRIDIUM DIFFICILE BY PCR: Toxigenic C. Difficile by PCR: NEGATIVE

## 2021-12-16 NOTE — Telephone Encounter (Signed)
  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 Spoke with the patient. She reports her stools are no longer diarrhea. She now has soft formed stools. She continues to have right sided pain. Reports pain with pressure to the area, or when ambulating. She has some periodic waves of nausea. Patient states she is on Trulicity. She has been on this for several months. 3 weeks ago she had an increase in her dosage. She wonders if this contributed to the diarrhea. She is now on the lower dose. She wonders if this has contributed to her improvement with her bowel movements. The persistent pain concerns her. She is tearful during the conversation. States tired, frustrated and feels like she is letting her kids down by being unable to participate in activities due to her GI issues. Concerns of negative impact on her employment as well. Patient is scheduled for follow up in office on 01/12/22. Please advise.

## 2021-12-20 ENCOUNTER — Other Ambulatory Visit: Payer: Self-pay

## 2021-12-20 MED ORDER — DICYCLOMINE HCL 10 MG PO CAPS: 10.0000 mg | ORAL_CAPSULE | Freq: Three times a day (TID) | ORAL | 0 refills | Status: DC | PRN

## 2021-12-20 NOTE — Telephone Encounter (Signed)
Spoke with the patient. She agrees to this plan of care. She would like to try the Bentyl. Rx to the [facility] in [place] per her request.

## 2021-12-22 NOTE — Telephone Encounter (Signed)
Inbound call from patient requesting to speak with a nurse in regards to medication Trulicity that was prescribed to her by her PCP. Patient states she recently started taking the medication again on Saturday and even with those dose change she is experiencing Vomiting nausea diarrhea and abdominal pain. Please give patient a call to further advise.  Thank you

## 2021-12-22 NOTE — Telephone Encounter (Signed)
Called patient back to offer an appointment on Monday 12/27/21 at 3:00 pm. No answer. Left her a voicemail to return my call.

## 2021-12-24 NOTE — Telephone Encounter (Signed)
Patient contacted. She agrees to this appointment.

## 2021-12-27 ENCOUNTER — Encounter: Payer: Self-pay | Admitting: Internal Medicine

## 2021-12-27 ENCOUNTER — Ambulatory Visit (INDEPENDENT_AMBULATORY_CARE_PROVIDER_SITE_OTHER): Payer: BC Managed Care – PPO | Admitting: Internal Medicine

## 2021-12-27 VITALS — BP 110/86 | HR 100 | Ht 65.0 in | Wt 334.0 lb

## 2021-12-27 DIAGNOSIS — R112 Nausea with vomiting, unspecified: Secondary | ICD-10-CM | POA: Diagnosis not present

## 2021-12-27 DIAGNOSIS — K760 Fatty (change of) liver, not elsewhere classified: Secondary | ICD-10-CM | POA: Diagnosis not present

## 2021-12-27 DIAGNOSIS — R1030 Lower abdominal pain, unspecified: Secondary | ICD-10-CM

## 2021-12-27 MED ORDER — METOCLOPRAMIDE HCL 5 MG PO TABS: 5.0000 mg | ORAL_TABLET | Freq: Three times a day (TID) | ORAL | 1 refills | Status: DC

## 2021-12-27 NOTE — Progress Notes (Signed)
Chief Complaint: Abdominal pain, N&V  HPI : 41 year old female with history of DM, OSA, depression presents for follow up of abdominal pain and N&V  Interval History: She has been having a lot of nausea and has to vomit on occasion. At work she has to call out at times because she is sick. She stopped the Trulicity because she could not tolerate the half dose due to N&V. Her last dose of Trulicity was about 1.5 weeks ago. Zofran has been helped somewhat with her N&V. She stopped the Linzess because she started developing diarrhea, presumably due to a recent gastroenteritis infection. The Bentyl helped a little bit with the pain. She is having 3-4 BMs per day on average.  Wt Readings from Last 3 Encounters:  12/27/21 (!) 334 lb (151.5 kg)  11/25/21 (!) 332 lb (150.6 kg)  11/22/21 (!) 333 lb 1.8 oz (151.1 kg)   Current Outpatient Medications  Medication Sig Dispense Refill   dapagliflozin propanediol (FARXIGA) 10 MG TABS tablet Take 10 mg by mouth daily.     dicyclomine (BENTYL) 10 MG capsule Take 1 capsule (10 mg total) by mouth 3 (three) times daily as needed for spasms. And abdominal pain 60 capsule 0   hydrochlorothiazide (HYDRODIURIL) 25 MG tablet Take 25 mg by mouth daily.     linaclotide (LINZESS) 145 MCG CAPS capsule Take 1 capsule (145 mcg total) by mouth daily before breakfast. 90 capsule 3   lisinopril (ZESTRIL) 20 MG tablet Take 20 mg by mouth daily.     ondansetron (ZOFRAN) 8 MG tablet Take 1 tablet (8 mg total) by mouth 3 (three) times daily as needed for nausea or vomiting. 60 tablet 3   pantoprazole (PROTONIX) 40 MG tablet Take 1 tablet (40 mg total) by mouth 2 (two) times daily before a meal. 60 tablet 1   rizatriptan (MAXALT) 10 MG tablet Take 10 mg by mouth as needed for migraine. May repeat in 2 hours if needed     rosuvastatin (CRESTOR) 20 MG tablet Take 20 mg by mouth daily.     Simethicone (GAS-X PO) Take 1 tablet by mouth daily as needed (gas).     levocetirizine  (XYZAL) 5 MG tablet Take 5 mg by mouth daily as needed for allergies.     TRULICITY 3 OY/7.7AJ SOPN Inject 3 mg into the skin every Saturday.     No current facility-administered medications for this visit.   Review of Systems: All systems reviewed and negative except where noted in HPI.   Physical Exam: BP 110/86   Pulse 100   Ht '5\' 5"'$  (1.651 m)   Wt (!) 334 lb (151.5 kg)   LMP 08/31/2015   BMI 55.58 kg/m  Constitutional: Pleasant,well-developed, female in no acute distress. HEENT: Normocephalic and atraumatic. Conjunctivae are normal. No scleral icterus. Cardiovascular: Normal rate, regular rhythm.  Pulmonary/chest: Effort normal and breath sounds normal. No wheezing, rales or rhonchi. Abdominal: Soft, nondistended, tender in the midline and in the RLQ. Extremities: Trace BLE edema Neurological: Alert and oriented to person place and time. Skin: Skin is warm and dry. No rashes noted. Psychiatric: Normal mood and affect. Behavior is normal.  Labs 11/2020: CBC with elevated WBC of 11.1. BMP unremarkable  Labs 08/2021: CBC with elevated WBC of 13.1. CMP with nml LFTs and mildly elevated glucose of 143. Lipase nml. TSH nml. CRP is elevated at 38.7.  Labs 10/2021: CRP nml. Hep A antibody, hep B surface antigen, hep B surface antibody, and hep  C antibody negative. ANA neg, ASMA neg. Ceruloplasmin neg. AMA neg. IgG negative. A1AT neg. INR mildly elevated at 1.1.   CTA A/P w/contrast 08/05/17: 1. No evidence of abdominal aortic atherosclerosis, dissection or aneurysm. 2. No acute abnormalities involving the abdomen or pelvis. Large colonic stool burden. 3. 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 4. No evidence of recurrent VENTRAL hernia after prior repair.  Pelvic U/S 06/05/19: IMPRESSION: Status post hysterectomy. The ovaries are not visualized and appeared within normal limits on recent  CT.  CT A/P w/o contrast 06/05/19: IMPRESSION: There again noted changes of bowel malrotation with the cecum identified in the midline in the majority of the small bowel seen within the right abdomen. These changes are stable from the prior exam. No obstructive changes are seen. No acute abnormality noted  CT A/P w/contrast 10/01/21: IMPRESSION: 1. No suspicious CT etiology for abdominal pain is identified. 2. Hepatic steatosis with morphologic changes which could reflect underlying cirrhosis. 3. Mild splenomegaly.  U/S Abd w/elastography 11/16/21: IMPRESSION: ULTRASOUND ABDOMEN: 1. Diffuse hepatic steatosis likely with some focal fatty sparing along the gallbladder fossa. 2. Splenomegaly. Hyperechoic lesion in the spleen is probably a hemangioma and has been present on prior exams. 3. Nonvisualization of the pancreatic tail and distal abdominal aorta due to overlying bowel gas. ULTRASOUND HEPATIC ELASTOGRAPHY: Median kPa:  2.0 Diagnostic category:  < or = 5 kPa: high probability of being normal  CT C/A/P w/contrast 11/25/21: IMPRESSION: 1. No acute findings or explanation for the patient's symptoms. No evidence of complication related to recent colonoscopy. 2. 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 3. Heterogeneous hepatic steatosis. 4. Low-density splenic lesion appears stable from chest CTA 01/26/2010, consistent with a benign finding. 5. No acute or significant chest findings.  EGD 11/22/21: - Normal esophagus. - Gastritis. Biopsied. - Six gastric polyps. Resected and retrieved. - Normal examined duodenum. Biopsied Path: A. DUODENUM, BIOPSY:  - Duodenal mucosa with normal villous architecture.  - No villous atrophy or increased intraepithelial lymphocytes.  B. STOMACH, BIOPSY:  - Antral and oxyntic mucosa with no significant pathologic changes.  - No Helicobacter pylori identified.  C. STOMACH, POLYPECTOMY:  - Fundic gland polyps.   Colonoscopy  11/22/21: - The examined portion of the ileum was normal. - Two 3 to 6 mm polyps in the ascending colon and in the cecum, removed with a cold snare. Resected and retrieved. - One 2 mm polyp in the transverse colon, removed with a cold biopsy forceps. Resected and retrieved. - One 4 mm polyp in the transverse colon, removed with a cold snare. Resected and retrieved. - Non-bleeding internal hemorrhoids. Path: D. COLON, CECAL, TRANSVERSE, POLYPECTOMY:  - Tubular adenoma (s) without high grade dysplasia.  - Sessile serrated polyp without cytologic dysplasia.   ASSESSMENT AND PLAN: N&V Abdominal pain Diarrhea due to recent enteropathogenic E coli infection Obesity Fatty liver, possible cirrhosis on imaging Mild splenomegaly Patient recent had a bout of diarrhea and had a GI profile that showed enteropathogenic E coli infection, which suggests that she recently had this infection. She has continued to deal with N&V issues. She was also recently taken off of Trulicity, which should help with her N&V over time. Patient with her longstanding history of DM may be at risk for gastroparesis. Thus will start her on Reglan to see if this helps with her N&V. I did counsel her on the extrapyramidal and potential  drowsiness side effects of Reglan. Hopefully she will not need to remain on Reglan for a long period of time. - Continue Zofran PRN - Start Reglan 5 mg TID ACS  - Continue Bentyl PRN - Consider hepatitis A and B vaccinations in the future - Consider gastric emptying study in the future - RTC 6 weeks  Christia Reading, MD

## 2021-12-27 NOTE — Patient Instructions (Signed)
We have sent the following medications to your pharmacy for you to pick up at your convenience: Generic reglan  Due to recent changes in healthcare laws, you may see the results of your imaging and laboratory studies on MyChart before your provider has had a chance to review them.  We understand that in some cases there may be results that are confusing or concerning to you. Not all laboratory results come back in the same time frame and the provider may be waiting for multiple results in order to interpret others.  Please give [place] 48 hours in order for your provider to thoroughly review all the results before contacting the office for clarification of your results.   _______________________________________________________  If you are age 35 or older, your body mass index should be between 23-30. Your Body mass index is 55.58 kg/m. If this is out of the aforementioned range listed, please consider follow up with your Primary Care Provider.  If you are age 35 or younger, your body mass index should be between 19-25. Your Body mass index is 55.58 kg/m. If this is out of the aformentioned range listed, please consider follow up with your Primary Care Provider.   ________________________________________________________  The [facility] providers would like to encourage you to use [facility] to communicate with providers for non-urgent requests or questions.  Due to long hold times on the telephone, sending your provider a message by [facility] may be a faster and more efficient way to get a response.  Please allow 48 business hours for a response.  Please remember that this is for non-urgent requests.  _______________________________________________________  I appreciate the opportunity to care for you. Georgian Co, MD

## 2022-01-10 DIAGNOSIS — U071 COVID-19: Secondary | ICD-10-CM | POA: Diagnosis not present

## 2022-01-12 ENCOUNTER — Ambulatory Visit: Payer: BC Managed Care – PPO | Admitting: Internal Medicine

## 2022-01-19 ENCOUNTER — Ambulatory Visit: Payer: BC Managed Care – PPO | Admitting: Internal Medicine

## 2022-02-15 ENCOUNTER — Ambulatory Visit: Payer: BC Managed Care – PPO | Admitting: Internal Medicine

## 2022-02-19 ENCOUNTER — Other Ambulatory Visit: Payer: Self-pay | Admitting: Internal Medicine

## 2022-02-22 ENCOUNTER — Other Ambulatory Visit: Payer: Self-pay | Admitting: Internal Medicine

## 2022-03-04 DIAGNOSIS — M545 Low back pain, unspecified: Secondary | ICD-10-CM | POA: Diagnosis not present

## 2022-03-04 DIAGNOSIS — M5432 Sciatica, left side: Secondary | ICD-10-CM | POA: Diagnosis not present

## 2022-03-04 DIAGNOSIS — G8929 Other chronic pain: Secondary | ICD-10-CM | POA: Diagnosis not present

## 2022-03-04 DIAGNOSIS — M62830 Muscle spasm of back: Secondary | ICD-10-CM | POA: Diagnosis not present

## 2022-03-04 DIAGNOSIS — E1165 Type 2 diabetes mellitus with hyperglycemia: Secondary | ICD-10-CM | POA: Diagnosis not present

## 2022-03-14 DIAGNOSIS — E7800 Pure hypercholesterolemia, unspecified: Secondary | ICD-10-CM | POA: Diagnosis not present

## 2022-03-14 DIAGNOSIS — R600 Localized edema: Secondary | ICD-10-CM | POA: Diagnosis not present

## 2022-03-14 DIAGNOSIS — Z23 Encounter for immunization: Secondary | ICD-10-CM | POA: Diagnosis not present

## 2022-03-14 DIAGNOSIS — E1165 Type 2 diabetes mellitus with hyperglycemia: Secondary | ICD-10-CM | POA: Diagnosis not present

## 2022-03-14 DIAGNOSIS — I10 Essential (primary) hypertension: Secondary | ICD-10-CM | POA: Diagnosis not present

## 2022-03-28 ENCOUNTER — Other Ambulatory Visit: Payer: Self-pay | Admitting: Internal Medicine

## 2022-04-19 DIAGNOSIS — G4733 Obstructive sleep apnea (adult) (pediatric): Secondary | ICD-10-CM | POA: Diagnosis not present

## 2022-05-12 DIAGNOSIS — Z1231 Encounter for screening mammogram for malignant neoplasm of breast: Secondary | ICD-10-CM | POA: Diagnosis not present

## 2022-05-16 ENCOUNTER — Other Ambulatory Visit: Payer: Self-pay | Admitting: Internal Medicine

## 2022-06-07 DIAGNOSIS — R238 Other skin changes: Secondary | ICD-10-CM | POA: Diagnosis not present

## 2022-06-07 DIAGNOSIS — L309 Dermatitis, unspecified: Secondary | ICD-10-CM | POA: Diagnosis not present

## 2022-06-07 DIAGNOSIS — F419 Anxiety disorder, unspecified: Secondary | ICD-10-CM | POA: Diagnosis not present

## 2022-06-15 DIAGNOSIS — H109 Unspecified conjunctivitis: Secondary | ICD-10-CM | POA: Diagnosis not present

## 2022-06-15 DIAGNOSIS — U071 COVID-19: Secondary | ICD-10-CM | POA: Diagnosis not present

## 2022-06-15 DIAGNOSIS — K3184 Gastroparesis: Secondary | ICD-10-CM | POA: Diagnosis not present

## 2022-06-15 DIAGNOSIS — E1143 Type 2 diabetes mellitus with diabetic autonomic (poly)neuropathy: Secondary | ICD-10-CM | POA: Diagnosis not present

## 2022-06-30 DIAGNOSIS — F418 Other specified anxiety disorders: Secondary | ICD-10-CM | POA: Diagnosis not present

## 2022-06-30 DIAGNOSIS — I10 Essential (primary) hypertension: Secondary | ICD-10-CM | POA: Diagnosis not present

## 2022-06-30 DIAGNOSIS — Z1331 Encounter for screening for depression: Secondary | ICD-10-CM | POA: Diagnosis not present

## 2022-06-30 DIAGNOSIS — E1165 Type 2 diabetes mellitus with hyperglycemia: Secondary | ICD-10-CM | POA: Diagnosis not present

## 2022-06-30 DIAGNOSIS — E7800 Pure hypercholesterolemia, unspecified: Secondary | ICD-10-CM | POA: Diagnosis not present

## 2022-07-13 DIAGNOSIS — E785 Hyperlipidemia, unspecified: Secondary | ICD-10-CM | POA: Diagnosis not present

## 2022-07-13 DIAGNOSIS — I10 Essential (primary) hypertension: Secondary | ICD-10-CM | POA: Diagnosis not present

## 2022-07-13 DIAGNOSIS — E1165 Type 2 diabetes mellitus with hyperglycemia: Secondary | ICD-10-CM | POA: Diagnosis not present

## 2022-08-03 ENCOUNTER — Emergency Department (HOSPITAL_COMMUNITY): Payer: BC Managed Care – PPO

## 2022-08-03 ENCOUNTER — Other Ambulatory Visit: Payer: Self-pay

## 2022-08-03 ENCOUNTER — Encounter (HOSPITAL_COMMUNITY): Payer: Self-pay

## 2022-08-03 ENCOUNTER — Emergency Department (HOSPITAL_COMMUNITY)
Admission: EM | Admit: 2022-08-03 | Discharge: 2022-08-03 | Disposition: A | Discharge status: Home / Self Care | Payer: BC Managed Care – PPO | Attending: Emergency Medicine | Admitting: Emergency Medicine

## 2022-08-03 DIAGNOSIS — K760 Fatty (change of) liver, not elsewhere classified: Secondary | ICD-10-CM | POA: Diagnosis not present

## 2022-08-03 DIAGNOSIS — R1030 Lower abdominal pain, unspecified: Secondary | ICD-10-CM | POA: Diagnosis not present

## 2022-08-03 DIAGNOSIS — R079 Chest pain, unspecified: Secondary | ICD-10-CM | POA: Diagnosis not present

## 2022-08-03 DIAGNOSIS — Z79899 Other long term (current) drug therapy: Secondary | ICD-10-CM | POA: Insufficient documentation

## 2022-08-03 DIAGNOSIS — R109 Unspecified abdominal pain: Secondary | ICD-10-CM | POA: Diagnosis not present

## 2022-08-03 DIAGNOSIS — I10 Essential (primary) hypertension: Secondary | ICD-10-CM | POA: Diagnosis not present

## 2022-08-03 DIAGNOSIS — Z7984 Long term (current) use of oral hypoglycemic drugs: Secondary | ICD-10-CM | POA: Diagnosis not present

## 2022-08-03 DIAGNOSIS — R1084 Generalized abdominal pain: Secondary | ICD-10-CM | POA: Diagnosis not present

## 2022-08-03 DIAGNOSIS — E119 Type 2 diabetes mellitus without complications: Secondary | ICD-10-CM | POA: Insufficient documentation

## 2022-08-03 LAB — CBC
HCT: 44.7 % (ref 36.0–46.0)
Hemoglobin: 14.2 g/dL (ref 12.0–15.0)
MCH: 26.2 pg (ref 26.0–34.0)
MCHC: 31.8 g/dL (ref 30.0–36.0)
MCV: 82.6 fL (ref 80.0–100.0)
Platelets: 255 10*3/uL (ref 150–400)
RBC: 5.41 MIL/uL — ABNORMAL HIGH (ref 3.87–5.11)
RDW: 15.9 % — ABNORMAL HIGH (ref 11.5–15.5)
WBC: 14.7 10*3/uL — ABNORMAL HIGH (ref 4.0–10.5)
nRBC: 0 % (ref 0.0–0.2)

## 2022-08-03 LAB — TROPONIN I (HIGH SENSITIVITY)
Troponin I (High Sensitivity): 2 ng/L (ref <18)
Troponin I (High Sensitivity): <2 ng/L (ref <18)

## 2022-08-03 LAB — COMPREHENSIVE METABOLIC PANEL
ALT: 25 U/L (ref 0–44)
AST: 22 U/L (ref 15–41)
Albumin: 3.8 g/dL (ref 3.5–5.0)
Alkaline Phosphatase: 71 U/L (ref 38–126)
Anion gap: 11 (ref 5–15)
BUN: 16 mg/dL (ref 6–20)
CO2: 21 mmol/L — ABNORMAL LOW (ref 22–32)
Calcium: 8.6 mg/dL — ABNORMAL LOW (ref 8.9–10.3)
Chloride: 104 mmol/L (ref 98–111)
Creatinine, Ser: 0.92 mg/dL (ref 0.44–1.00)
GFR, Estimated: >60 mL/min (ref >60)
Glucose, Bld: 280 mg/dL — ABNORMAL HIGH (ref 70–99)
Potassium: 3.6 mmol/L (ref 3.5–5.1)
Sodium: 136 mmol/L (ref 135–145)
Total Bilirubin: 0.7 mg/dL (ref 0.3–1.2)
Total Protein: 7.1 g/dL (ref 6.5–8.1)

## 2022-08-03 LAB — URINALYSIS, ROUTINE W REFLEX MICROSCOPIC
Bacteria, UA: NONE SEEN
Bilirubin Urine: NEGATIVE
Glucose, UA: >=500 mg/dL — AB
Hgb urine dipstick: NEGATIVE
Ketones, ur: 5 mg/dL — AB
Leukocytes,Ua: NEGATIVE
Nitrite: NEGATIVE
Protein, ur: NEGATIVE mg/dL
Specific Gravity, Urine: 1.025 (ref 1.005–1.030)
pH: 5 (ref 5.0–8.0)

## 2022-08-03 LAB — LIPASE, BLOOD: Lipase: 32 U/L (ref 11–51)

## 2022-08-03 MED ORDER — ONDANSETRON 4 MG PO TBDP
4.0000 mg | ORAL_TABLET | Freq: Once | ORAL | Status: AC
  Administered 2022-08-03: 4 mg via ORAL
  Filled 2022-08-03: qty 1

## 2022-08-03 MED ORDER — MORPHINE SULFATE (PF) 4 MG/ML IV SOLN
4.0000 mg | Freq: Once | INTRAVENOUS | Status: AC
  Administered 2022-08-03: 4 mg via INTRAVENOUS
  Filled 2022-08-03: qty 1

## 2022-08-03 MED ORDER — IOHEXOL 300 MG/ML  SOLN
100.0000 mL | Freq: Once | INTRAMUSCULAR | Status: AC | PRN
  Administered 2022-08-03: 100 mL via INTRAVENOUS

## 2022-08-03 MED ORDER — HYDROCODONE-ACETAMINOPHEN 5-325 MG PO TABS: 1.0000 | ORAL_TABLET | ORAL | 0 refills | Status: DC | PRN

## 2022-08-03 NOTE — ED Triage Notes (Signed)
Patient is here for evaluation of pain on both sides of her stomach. Reports it has been going on for a few days, but has become worse today. Reports that she has had decreased urination today, but is drinking fluid like regular. States she usually urinates every hour. Reports nausea, denies any vomiting. States been having very soft stools lately.

## 2022-08-03 NOTE — ED Provider Notes (Signed)
[place] EMERGENCY DEPARTMENT AT [name] Provider Note   [name]: [id number] Arrival date & time: 08/03/22  1340     History  Chief Complaint  Patient presents with   Abdominal Pain    Kathleen Walsh is a 42 y.o. female.  Patient presents to the emergency department complaining of lower abdominal pain which has been ongoing for a few days.  Patient states that earlier today became much worse.  She states that she has decreased urination, nausea, and soft stools.  She denies chest pain, shortness of breath, vomiting, headaches, fevers.  She denies aggravating or alleviating symptoms.  Past medical history significant for abdominal wall pain, type II DM without complication, hypertension, obesity, anxiety, cardiomegaly, chronic left-sided low back pain with left-sided sciatica  HPI     Home Medications Prior to Admission medications   Medication Sig Start Date End Date Taking? Authorizing Provider  HYDROcodone-acetaminophen (NORCO/VICODIN) 5-325 MG tablet Take 1-2 tablets by mouth every 4 (four) hours as needed. 08/03/22  Yes Cherlynn June B, PA-C  dapagliflozin propanediol (FARXIGA) 10 MG TABS tablet Take 10 mg by mouth daily.    [provider]  dicyclomine (BENTYL) 10 MG capsule Take 1 capsule (10 mg total) by mouth 3 (three) times daily as needed for spasms. And abdominal pain 12/20/21   Sharyn Creamer, MD  hydrochlorothiazide (HYDRODIURIL) 25 MG tablet Take 25 mg by mouth daily.    [provider]  levocetirizine (XYZAL) 5 MG tablet Take 5 mg by mouth daily as needed for allergies.    [provider]  linaclotide Rolan Lipa) 145 MCG CAPS capsule Take 1 capsule (145 mcg total) by mouth daily before breakfast. 09/14/21   Sharyn Creamer, MD  lisinopril (ZESTRIL) 20 MG tablet Take 20 mg by mouth daily.    [provider]  metoCLOPramide (REGLAN) 5 MG tablet TAKE 1 TABLET BY MOUTH 3 TIMES DAILY BEFORE MEALS. 02/22/22   Sharyn Creamer, MD   ondansetron (ZOFRAN) 8 MG tablet TAKE 1 TABLET (8 MG TOTAL) BY MOUTH 3 (THREE) TIMES DAILY AS NEEDED FOR NAUSEA OR VOMITING. 03/28/22   Sharyn Creamer, MD  pantoprazole (PROTONIX) 40 MG tablet TAKE 1 TABLET (40 MG TOTAL) BY MOUTH TWICE A DAY BEFORE MEALS 05/16/22   Sharyn Creamer, MD  rizatriptan (MAXALT) 10 MG tablet Take 10 mg by mouth as needed for migraine. May repeat in 2 hours if needed    [provider]  rosuvastatin (CRESTOR) 20 MG tablet Take 20 mg by mouth daily.    [provider]  Simethicone (GAS-X PO) Take 1 tablet by mouth daily as needed (gas).    [provider]  TRULICITY 3 0000000 SOPN Inject 3 mg into the skin every Saturday. 11/09/21   [provider]      Allergies    Amoxicillin, Metformin and related, and Trulicity [dulaglutide]    Review of Systems   Review of Systems  Physical Exam Updated Vital Signs BP 132/78   Pulse 95   Temp 98.5 F (36.9 C) (Oral)   Resp 17   Ht 5\' 5"  (1.651 m)   Wt (!) 150.6 kg   LMP 08/31/2015   SpO2 96%   BMI 55.25 kg/m  Physical Exam Vitals and nursing note reviewed.  Constitutional:      General: She is not in acute distress.    Appearance: She is well-developed.  HENT:     Head: Normocephalic and atraumatic.  Eyes:  Conjunctiva/sclera: Conjunctivae normal.  Cardiovascular:     Rate and Rhythm: Normal rate and regular rhythm.     Heart sounds: No murmur heard. Pulmonary:     Effort: Pulmonary effort is normal. No respiratory distress.     Breath sounds: Normal breath sounds.  Abdominal:     Palpations: Abdomen is soft.     Tenderness: There is abdominal tenderness in the right lower quadrant, epigastric area and left upper quadrant.  Musculoskeletal:        General: No swelling.     Cervical back: Neck supple.  Skin:    General: Skin is warm and dry.     Capillary Refill: Capillary refill takes less than 2 seconds.  Neurological:     Mental Status: She is alert.   Psychiatric:        Mood and Affect: Mood normal.     ED Results / Procedures / Treatments   Labs (all labs ordered are listed, but only abnormal results are displayed) Labs Reviewed  COMPREHENSIVE METABOLIC PANEL - Abnormal; Notable for the following components:      Result Value   CO2 21 (*)    Glucose, Bld 280 (*)    Calcium 8.6 (*)    All other components within normal limits  CBC - Abnormal; Notable for the following components:   WBC 14.7 (*)    RBC 5.41 (*)    RDW 15.9 (*)    All other components within normal limits  URINALYSIS, ROUTINE W REFLEX MICROSCOPIC - Abnormal; Notable for the following components:   Color, Urine STRAW (*)    Glucose, UA >=500 (*)    Ketones, ur 5 (*)    All other components within normal limits  LIPASE, BLOOD  TROPONIN I (HIGH SENSITIVITY)  TROPONIN I (HIGH SENSITIVITY)    EKG None  Radiology DG Chest 2 View  Result Date: 08/03/2022 CLINICAL DATA:  Chest pain EXAM: CHEST - 2 VIEW COMPARISON:  Previous studies including the CT chest done on 11/25/2021 FINDINGS: Examination is technically difficult due to patient's body habitus. Transverse diameter of heart is increased. There are no signs of pulmonary edema or focal pulmonary consolidation. There is no pleural effusion or pneumothorax. IMPRESSION: Cardiomegaly. There are no signs of pulmonary edema or focal pulmonary consolidation. Electronically Signed   By: Elmer Picker M.D.   On: 08/03/2022 20:21   CT ABDOMEN PELVIS W CONTRAST  Result Date: 08/03/2022 CLINICAL DATA:  Acute nonlocalized abdominal pain, bilateral flank pain, left greater than right. EXAM: CT ABDOMEN AND PELVIS WITH CONTRAST TECHNIQUE: Multidetector CT imaging of the abdomen and pelvis was performed using the standard protocol following bolus administration of intravenous contrast. RADIATION DOSE REDUCTION: This exam was performed according to the departmental dose-optimization program which includes automated exposure  control, adjustment of the mA and/or kV according to patient size and/or use of iterative reconstruction technique. CONTRAST:  136mL OMNIPAQUE IOHEXOL 300 MG/ML  SOLN COMPARISON:  11/25/2021 FINDINGS: Lower chest: No acute abnormality. Hepatobiliary: Mild hepatomegaly and hepatic steatosis is again noted and is unchanged. No enhancing intrahepatic mass. No intra or extrahepatic biliary ductal dilation. Gallbladder unremarkable. Pancreas: Unremarkable Spleen: 3.7 cm hypodense solid lesion within the central aspect of the spleen demonstrates interval increase in size since prior examination with this measures 3.1 cm in greatest dimension. Given its slow growth, differential considerations should include a low-grade lymphoma or potentially inflammatory pseudotumor. The spleen is not enlarged. No perisplenic fluid collection. Adrenals/Urinary Tract: Adrenal glands are unremarkable. Kidneys are  normal, without renal calculi, focal lesion, or hydronephrosis. Bladder is unremarkable. Stomach/Bowel: Duodenal malrotation again noted with the colon located within the left hemiabdomen and small bowel present within the right hemiabdomen. No evidence of obstruction or focal inflammation. The stomach, small bowel, and large bowel are otherwise unremarkable. The appendix, located within the supraumbilical midline, is unremarkable. No free intraperitoneal gas or fluid. Vascular/Lymphatic: No significant vascular findings are present. No enlarged abdominal or pelvic lymph nodes. Reproductive: Status post hysterectomy. No adnexal masses. Other: Infiltrative changes are again seen within the periumbilical region which may reflect the sequela of remote surgery, trauma or inflammation given its stability over time. No abdominal wall hernia. Musculoskeletal: No acute bone abnormality. No lytic or blastic bone lesion. IMPRESSION: 1. No acute intra-abdominal pathology identified. No definite radiographic explanation for the patient's  reported symptoms. 2. Mild hepatomegaly and hepatic steatosis, unchanged. 3. 3.7 cm hypodense solid lesion within the central aspect of the spleen demonstrating interval increase in size since prior examination. Given its slow growth, differential considerations should include a low-grade lymphoma or potentially inflammatory pseudotumor. Contrast enhanced MRI examination would be more helpful for further evaluation. 4. Duodenal malrotation. No evidence of obstruction or focal inflammation. Electronically Signed   By: Fidela Salisbury M.D.   On: 08/03/2022 19:38    Procedures Procedures    Medications Ordered in ED Medications  ondansetron (ZOFRAN-ODT) disintegrating tablet 4 mg (4 mg Oral Given 08/03/22 1427)  morphine (PF) 4 MG/ML injection 4 mg (4 mg Intravenous Given 08/03/22 1828)  iohexol (OMNIPAQUE) 300 MG/ML solution 100 mL (100 mLs Intravenous Contrast Given 08/03/22 1911)  morphine (PF) 4 MG/ML injection 4 mg (4 mg Intravenous Given 08/03/22 2001)    ED Course/ Medical Decision Making/ A&P                             Medical Decision Making Amount and/or Complexity of Data Reviewed Radiology: ordered.  Risk Prescription drug management.   This patient presents to the ED for concern of abdominal pain, this involves an extensive number of treatment options, and is a complaint that carries with it a high risk of complications and morbidity.  The differential diagnosis includes appendicitis, colitis, cholecystitis, nephrolithiasis's, others   Co morbidities that complicate the patient evaluation  Cardiomegaly, abdominal pain, chronic low back pain, anxiety, hypertension   Additional history obtained:   External records from outside source obtained and reviewed including notes from primary care.  It does appear the patient may have just recently started Ozempic.   Lab Tests:  I Ordered, and personally interpreted labs.  The pertinent results include: WBC 14.7, negative troponins,  lipase 32, grossly unremarkable CMP, urinalysis   Imaging Studies ordered:  I ordered imaging studies including chest x-ray and CT abdomen pelvis with contrast I independently visualized and interpreted imaging which showed cardiomegaly on chest x-ray with no consolidation or edema.  CT showed: 1. No acute intra-abdominal pathology identified. No definite  radiographic explanation for the patient's reported symptoms.  2. Mild hepatomegaly and hepatic steatosis, unchanged.  3. 3.7 cm hypodense solid lesion within the central aspect of the  spleen demonstrating interval increase in size since prior  examination. Given its slow growth, differential considerations  should include a low-grade lymphoma or potentially inflammatory  pseudotumor. Contrast enhanced MRI examination would be more helpful  for further evaluation.  4. Duodenal malrotation. No evidence of obstruction or focal  inflammation.   I agree with  the radiologist interpretation   Cardiac Monitoring: / EKG:  The patient was maintained on a cardiac monitor.  I personally viewed and interpreted the cardiac monitored which showed an underlying rhythm of: Possible ectopic atrial rhythm  Problem List / ED Course / Critical interventions / Medication management   I ordered medication including morphine for pain, Zofran for nausea Reevaluation of the patient after these medicines showed that the patient improved I have reviewed the patients home medicines and have made adjustments as needed   Test / Admission - Considered:  CT scan with incidental finding of hypodense solid lesion in the central aspect of the spleen.  No acute finding.  No appendicitis, cholecystitis, pancreatitis.  Chest workup was also benign with negative troponins, nonischemic EKG, nonacute chest x-ray showing cardiomegaly, stable.  Further chart review shows the patient apparently recently started Ozempic.  Question if patient may be having adverse reaction  to the Ozempic.  Patient also prescribed medications for IBS such as Linzess, and Bentyl for abdominal pain/spasms.  Possible flare of chronic abdominal pain.  Patient will follow-up with her primary care provider.  Pain medication prescribed and sent.  Return precautions provided.  Discharged home.        Final Clinical Impression(s) / ED Diagnoses Final diagnoses:  Generalized abdominal pain    Rx / DC Orders ED Discharge Orders          Ordered    HYDROcodone-acetaminophen (NORCO/VICODIN) 5-325 MG tablet  Every 4 hours PRN        08/03/22 2309              Ronny Bacon 08/03/22 2317    Fransico Meadow, MD 08/04/22 [id number]

## 2022-08-03 NOTE — ED Notes (Signed)
While evaluating patients pain she stated she was also have sharp shooting  chest pain. Provider made aware and EKG, pain meds and trop ordered.

## 2022-08-03 NOTE — ED Provider Triage Note (Signed)
Emergency Medicine Provider Triage Evaluation Note  Kathleen Walsh , a 42 y.o. female  was evaluated in triage.  Pt complains of abdominal pain on both flanks, worse on left for the last several days, worse today. Hx of diabetes, obesity. Reports radiating into back. Denies chest pain, shob. Endorses nausea. Reports less frequency with urination. .  Review of Systems  Positive: Abdominal pain, nausea Negative: Fever, chills, chest pain, shob  Physical Exam  BP 114/71   Pulse 100   Temp 98.2 F (36.8 C) (Oral)   Resp 18   Ht 5\' 5"  (1.651 m)   Wt (!) 150.6 kg   LMP 08/31/2015   SpO2 96%   BMI 55.25 kg/m  Gen:   Awake, no distress   Resp:  Normal effort  MSK:   Moves extremities without difficulty  Other:  Ttp in right and left flanks, no CVA ttp  Medical Decision Making  Medically screening exam initiated at 2:18 PM.  Appropriate orders placed.  Kathleen Walsh was informed that the remainder of the evaluation will be completed by another provider, this initial triage assessment does not replace that evaluation, and the importance of remaining in the ED until their evaluation is complete.  Workup initiated in triage    Kathleen Walsh, [place] 08/03/22 1419

## 2022-08-03 NOTE — Discharge Instructions (Addendum)
You were evaluated today for abdominal pain.  Your CT scan shows no acute process to explain your pain today.  There was no sign of appendicitis, cholecystitis, colitis.  The chest x-ray was unremarkable.  Your troponins, which measure potential heart damage were negative as well and reassuring.  I have prescribed pain medication to help you over the next few days.  You may also take your previously prescribed Zofran for nausea.  There was an incidental finding on the CT scan and it is recommended that she get an outpatient MRI for further evaluation.  I have copied the information from the CT scan below.  Please follow-up with your primary care provider.  3.7 cm hypodense solid lesion within the central aspect of  the spleen demonstrates interval increase in size since prior  examination with this measures 3.1 cm in greatest dimension. Given  its slow growth, differential considerations should include a  low-grade lymphoma or potentially inflammatory pseudotumor. The  spleen is not enlarged

## 2022-08-09 DIAGNOSIS — E785 Hyperlipidemia, unspecified: Secondary | ICD-10-CM | POA: Diagnosis not present

## 2022-08-09 DIAGNOSIS — E1165 Type 2 diabetes mellitus with hyperglycemia: Secondary | ICD-10-CM | POA: Diagnosis not present

## 2022-08-09 DIAGNOSIS — I10 Essential (primary) hypertension: Secondary | ICD-10-CM | POA: Diagnosis not present

## 2022-08-10 DIAGNOSIS — E1165 Type 2 diabetes mellitus with hyperglycemia: Secondary | ICD-10-CM | POA: Diagnosis not present

## 2022-08-11 DIAGNOSIS — G8929 Other chronic pain: Secondary | ICD-10-CM | POA: Diagnosis not present

## 2022-08-11 DIAGNOSIS — R109 Unspecified abdominal pain: Secondary | ICD-10-CM | POA: Diagnosis not present

## 2022-08-11 DIAGNOSIS — M5441 Lumbago with sciatica, right side: Secondary | ICD-10-CM | POA: Diagnosis not present

## 2022-08-11 DIAGNOSIS — M5442 Lumbago with sciatica, left side: Secondary | ICD-10-CM | POA: Diagnosis not present

## 2022-08-15 DIAGNOSIS — E1165 Type 2 diabetes mellitus with hyperglycemia: Secondary | ICD-10-CM | POA: Diagnosis not present

## 2022-08-15 DIAGNOSIS — Z794 Long term (current) use of insulin: Secondary | ICD-10-CM | POA: Diagnosis not present

## 2022-08-15 DIAGNOSIS — F418 Other specified anxiety disorders: Secondary | ICD-10-CM | POA: Diagnosis not present

## 2022-08-18 DIAGNOSIS — E1165 Type 2 diabetes mellitus with hyperglycemia: Secondary | ICD-10-CM | POA: Diagnosis not present

## 2022-08-18 DIAGNOSIS — I10 Essential (primary) hypertension: Secondary | ICD-10-CM | POA: Diagnosis not present

## 2022-08-18 DIAGNOSIS — E785 Hyperlipidemia, unspecified: Secondary | ICD-10-CM | POA: Diagnosis not present

## 2022-08-30 DIAGNOSIS — E1142 Type 2 diabetes mellitus with diabetic polyneuropathy: Secondary | ICD-10-CM | POA: Diagnosis not present

## 2022-08-31 DIAGNOSIS — E1165 Type 2 diabetes mellitus with hyperglycemia: Secondary | ICD-10-CM | POA: Diagnosis not present

## 2022-08-31 DIAGNOSIS — I10 Essential (primary) hypertension: Secondary | ICD-10-CM | POA: Diagnosis not present

## 2022-08-31 DIAGNOSIS — E785 Hyperlipidemia, unspecified: Secondary | ICD-10-CM | POA: Diagnosis not present

## 2022-10-04 ENCOUNTER — Other Ambulatory Visit: Payer: Self-pay

## 2022-10-04 ENCOUNTER — Ambulatory Visit: Payer: Medicaid Other | Attending: Orthopedic Surgery

## 2022-10-04 DIAGNOSIS — M5459 Other low back pain: Secondary | ICD-10-CM | POA: Diagnosis present

## 2022-10-04 DIAGNOSIS — M6281 Muscle weakness (generalized): Secondary | ICD-10-CM

## 2022-10-04 DIAGNOSIS — R262 Difficulty in walking, not elsewhere classified: Secondary | ICD-10-CM

## 2022-10-04 NOTE — Patient Instructions (Signed)
Aquatic Therapy at [facility]-  What to Expect!  Where:   [facility] [address] [place], [place] [address] Rehab phone [phone]  NOTE:  You will receive an automated phone message reminding you of your appt and it will say the appointment is at the [address] [facility] [address] [facility] clinic.          How to Prepare: Please make sure you drink 8 ounces of water about one hour prior to your pool session A caregiver may attend if needed with the patient to help assist as needed. A caregiver can sit in the pool room on chair. Please arrive IN YOUR SUIT and 15 minutes prior to your appointment - this helps to avoid delays in starting your session. Please make sure to attend to any toileting needs prior to entering the pool Locker rooms for changing are provided.   There is direct access to the pool deck form the locker room.  You can lock your belongings in a locker with lock provided. Once on the pool deck your therapist will ask if you have signed the Patient  Consent and Assignment of Benefits form before beginning treatment Your therapist may take your blood pressure prior to, during and after your session if indicated We usually try and create a home exercise program based on activities we do in the pool.  Please be thinking about who might be able to assist you in the pool should you need to participate in an aquatic home exercise program at the time of discharge if you need assistance.  Some patients do not want to or do not have the ability to participate in an aquatic home program - this is not a barrier in any way to you participating in aquatic therapy as part of your current therapy plan! After Discharge from PT, you can continue using home program at  the [facility]/, there is a drop-in fee for $5 ($45 a month)or for 60 years  or older $4.00 ($40 a month for seniors ) or any local [employer] pool.  Memberships for purchase are  available for gym/pool at [facility]  IT IS VERY IMPORTANT THAT YOUR LAST VISIT BE IN THE CLINIC AT [address] AFTER YOUR LAST AQUATIC VISIT.  PLEASE MAKE SURE THAT YOU HAVE A [address]  APPOINTMENT SCHEDULED.   About the pool: Pool is located approximately 500 FT from the entrance of the building.  Please bring a support person if you need assistance traveling this      distance.   Your therapist will assist you in entering the water; there are two ways to           enter: stairs with railings, and a mechanical lift. Your therapist will determine the most appropriate way for you.  Water temperature is usually between 88-90 degrees  There may be up to 2 other swimmers in the pool at the same time  The pool deck is tile, please wear shoes with good traction if you prefer not to be barefoot.    Contact Info:  For appointment scheduling and cancellations:         Please call the [facility]  PH:[phone]              Aquatic Therapy  Outpatient Rehabilitation @ [facility]       All sessions are 45 minutes                                                    

## 2022-10-04 NOTE — Therapy (Addendum)
OUTPATIENT PHYSICAL THERAPY THORACOLUMBAR EVALUATION PHYSICAL THERAPY DISCHARGE SUMMARY  Visits from Start of Care: 1  Current functional level related to goals / functional outcomes: No re-assessment of goals   Remaining deficits: Status unknown   Education / Equipment: N/A   Patient agrees to discharge. Patient goals were not met. Patient is being discharged due to not returning since the last visit.   Patient Name: Kathleen Walsh MRN: [id number] DOB:July 16, 1980,42 y.o., female Today's Date: 10/04/2022   END OF SESSION:  PT End of Session - 10/04/22 1136     Visit Number 1    Number of Visits 17    Date for PT Re-Evaluation 12/03/22    Authorization Type [employer]    PT Start Time 1136    PT Stop Time 1218    PT Time Calculation (min) 42 min    Activity Tolerance Patient tolerated treatment well    Behavior During Therapy [facility] for tasks assessed/performed              Past Medical History:  Diagnosis Date   Abdominal wall pain    Anemia    Anxiety    Blood dyscrasia    lupus anticoagulant during pregnancy   Complication of anesthesia    pt states became " panicky" after left shoulder surgery 10/2019   Depression    takes cymbalta    Diabetes mellitus without complication (HCC)    Headache(784.0)    migraines   Hx of lupus anticoagulant disorder    Hyperlipidemia    Hypertension    Obesity    PONV (postoperative nausea and vomiting)    Sleep apnea    USES C-PAP   Past Surgical History:  Procedure Laterality Date   ABDOMINAL HYSTERECTOMY  2016   ANKLE ARTHROSCOPY WITH REPAIR SUBLUXING TENDON Left    BIOPSY  11/22/2021   Procedure: BIOPSY;  Surgeon: Imogene Burn, MD;  Location: [facility];  Service: Gastroenterology;;   CESAREAN SECTION WITH BILATERAL TUBAL LIGATION Bilateral 02/11/2013   Procedure: Repeat CESAREAN SECTION WITH BILATERAL TUBAL LIGATION;  Surgeon: Lenoard Aden, MD;  Location: [facility];  Service: Obstetrics;  Laterality:  Bilateral;  EDD: 03/01/13   COLONOSCOPY WITH PROPOFOL N/A 11/22/2021   Procedure: COLONOSCOPY WITH PROPOFOL;  Surgeon: Imogene Burn, MD;  Location: [facility];  Service: Gastroenterology;  Laterality: N/A;   DEBRIDEMENT OF ABDOMINAL WALL ABSCESS N/A 12/03/2014   Procedure: INCISION OF ABDOMINAL WALL LIPOMA;  Surgeon: Gaynelle Adu, MD;  Location: [facility];  Service: General;  Laterality: N/A;   DILATION AND CURETTAGE OF UTERUS  2004   DILITATION & CURRETTAGE/HYSTROSCOPY WITH NOVASURE ABLATION N/A 11/19/2014   Procedure: DILATATION & CURETTAGE/HYSTEROSCOPY WITH NOVASURE ABLATION;  Surgeon: Olivia Mackie, MD;  Location: [facility];  Service: Gynecology;  Laterality: N/A;   ENDOMETRIAL ABLATION  10/20/2014   ESOPHAGOGASTRODUODENOSCOPY (EGD) WITH PROPOFOL N/A 11/22/2021   Procedure: ESOPHAGOGASTRODUODENOSCOPY (EGD) WITH PROPOFOL;  Surgeon: Imogene Burn, MD;  Location: [facility];  Service: Gastroenterology;  Laterality: N/A;   FOOT SURGERY  2008   HERNIA REPAIR  09/19/2011   LAPAROSCOPY N/A 12/03/2014   Procedure: LAPAROSCOPY DIAGNOSTIC WITH LYSIS OF ADHESIONS;  Surgeon: Gaynelle Adu, MD;  Location: [facility];  Service: General;  Laterality: N/A;   left shoulder surgery      NASAL SEPTUM SURGERY     POLYPECTOMY  11/22/2021   Procedure: POLYPECTOMY;  Surgeon: Imogene Burn, MD;  Location: [facility];  Service: Gastroenterology;;  EGD and COLON   TUBAL LIGATION  VENTRAL HERNIA REPAIR  09/20/2010   Laparoscopic, Dr Gaynelle Adu   WISDOM TOOTH EXTRACTION     Patient Active Problem List   Diagnosis Date Noted   Pulmonary nodule 08/08/2017   OSA on CPAP 08/08/2017   Acute back pain with sciatica, left 08/14/2016   Trochanteric bursitis, left hip 08/12/2016   Impingement syndrome of left ankle 07/20/2016   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   Pain in left ankle and joints of left foot 07/13/2016   Chronic left-sided low back pain with left-sided sciatica 07/13/2016   Abnormal EKG 04/12/2016    Cardiomegaly 08/15/2013   SOB (shortness of breath) 08/15/2013   Pleuritic chest pain 08/15/2013   Postpartum care following cesarean delivery and tubal sterilization (10/13) 02/11/2013   Abdominal muscle strain 12/20/2011   Constipation 07/06/2011   Abdominal pain 07/06/2011   DIABETES MELLITUS, GESTATIONAL 04/17/2009   DYSPNEA 04/17/2009    PCP: [facility]   REFERRING PROVIDER:   Venita Lick, MD    REFERRING DIAG:  M54.51 (ICD-10-CM) - Vertebrogenic low back pain      Rationale for Evaluation and Treatment: Rehabilitation  THERAPY DIAG:  Other low back pain  ONSET DATE: chronic   SUBJECTIVE:                                                                                                                                                                                           SUBJECTIVE STATEMENT: Patient reports her back has been bothering her for a couple years. She gets spasms where she can't hardly move and will have to rest for them to go away. Her back bothers her more if she has been on her feet all day. If she sleeps for 8 hours she will wake up with her back hurting. She does not recall any MOI, but feels that her pain is attributed to her weight. She is awaiting an appointment with bariatric specialist. She feels that her pain has stayed the same since onset. The pain is in the low back and can radiate into bilateral hips (mostly along posterolateral aspect, but can be anteriorly as well). She reports numbness/tingling in her toes that she attributes to diabetic neuropathy. She denies any changes in bowel/bladder.   PERTINENT HISTORY:  Hernia  Anxiety Depression Diabetes  Hypertension  Obesity  Multiple abdominal surgeries (see above)   PAIN:  Are you having pain? Yes: NPRS scale: 2 (at worst 8)/10 Pain location: low back Pain description: digging,tingling Aggravating factors: prolonged standing,walking, bending/lifting Relieving  factors: rest  PRECAUTIONS: None  WEIGHT BEARING RESTRICTIONS: No  FALLS:  Has patient fallen in last 6 months? No  LIVING ENVIRONMENT: Lives with: lives with their family Lives in: Mobile home Stairs: Yes: External: 6 steps; can reach both Has following equipment at home: None  OCCUPATION: unemployed   PLOF: Independent  PATIENT GOALS: "be able to walk around with my kids and not be in tears."    OBJECTIVE:   DIAGNOSTIC FINDINGS:  None on file   PATIENT SURVEYS:  FOTO 55% function to 58% predicted   SCREENING FOR RED FLAGS: Bowel or bladder incontinence: No Spinal tumors: No Cauda equina syndrome: No Compression fracture: No Abdominal aneurysm: No  COGNITION: Overall cognitive status: Within functional limits for tasks assessed     SENSATION: Not tested  MUSCLE LENGTH: Hamstrings: moderate tightness bilaterally   POSTURE: rounded shoulders, forward head, and decreased lumbar lordosis  PALPATION: Tautness and palpable tenderness bilateral lumbar paraspinals   LUMBAR ROM:   AROM eval  Flexion 25% limited pulling in low back   Extension WNL  Right lateral flexion WNL  Left lateral flexion WNL  Right rotation WNL  Left rotation WNL   (Blank rows = not tested)  LOWER EXTREMITY ROM:     Active  Right eval Left eval  Hip flexion    Hip extension    Hip abduction    Hip adduction    Hip internal rotation    Hip external rotation    Knee flexion    Knee extension    Ankle dorsiflexion    Ankle plantarflexion    Ankle inversion    Ankle eversion     (Blank rows = not tested)  LOWER EXTREMITY MMT:    MMT Right eval Left eval  Hip flexion 5 4 pn anterior hip  Hip extension 4 4  Hip abduction 4 4-  Hip adduction    Hip internal rotation    Hip external rotation    Knee flexion    Knee extension    Ankle dorsiflexion    Ankle plantarflexion    Ankle inversion    Ankle eversion     (Blank rows = not tested)  LUMBAR SPECIAL TESTS:   (-) SLR  FUNCTIONAL TESTS:  5 x STS: 18.1 seconds   6 MWT: 1198 ft   GAIT: Distance walked: 6 MWT Assistive device utilized: None Level of assistance: Complete Independence Comments: excessive frontal plane movement; WBOS, limited trunk rotation   [facility] Adult PT Treatment:                                                DATE: 10/04/22  Therapeutic Exercise: Demonstrated and issued initial HEP.    Therapeutic Activity: Education on assessment findings that will be addressed throughout duration of POC.       PATIENT EDUCATION:  Education details: see treatment Person educated: Patient Education method: Explanation, Demonstration, Tactile cues, Verbal cues, and Handouts Education comprehension: verbalized understanding, returned demonstration, verbal cues required, tactile cues required, and needs further education  HOME EXERCISE PROGRAM: Access Code: [email or web address] URL: [email or web address] Date: 10/04/2022 Prepared by: Letitia Libra  Exercises - Supine Lower Trunk Rotation  - 1 x daily - 7 x weekly - 2 sets - 10 reps - Supine Bridge  - 1 x daily - 7 x weekly - 2 sets - 10 reps - Supine Active Straight Leg Raise  - 1 x daily - 7 x weekly - 2 sets - 10 reps -  Hooklying Clamshell with Resistance  - 1 x daily - 7 x weekly - 2 sets - 10 reps  ASSESSMENT:  CLINICAL IMPRESSION: Patient is a 42 y.o. female who was seen today for physical therapy evaluation and treatment for chronic low back pain. Upon assessment she is noted to have limited trunk AROM, hip/core weakness, postural dysfunction, and gait abnormalities. She will benefit from skilled PT to address the above stated deficits in order to optimize her function and assist in overall pain reduction.   OBJECTIVE IMPAIRMENTS: Abnormal gait, decreased activity tolerance, decreased knowledge of condition, difficulty walking, decreased ROM, decreased strength, increased fascial restrictions, impaired flexibility,  improper body mechanics, postural dysfunction, obesity, and pain.   ACTIVITY LIMITATIONS: carrying, lifting, bending, sitting, standing, squatting, and locomotion level  PARTICIPATION LIMITATIONS: meal prep, cleaning, laundry, driving, shopping, community activity, and yard work  PERSONAL FACTORS: Age, Fitness, Time since onset of injury/illness/exacerbation, and 3+ comorbidities: see PMH above  are also affecting patient's functional outcome.   REHAB POTENTIAL: Fair chronicity of injury; co-morbidities  CLINICAL DECISION MAKING: Stable/uncomplicated  EVALUATION COMPLEXITY: Low   GOALS: Goals reviewed with patient? Yes  SHORT TERM GOALS: Target date: 11/01/2022    Patient will be independent and compliant with initial HEP.   Baseline: see above Goal status: INITIAL  2.  Patient will complete 5 x STS in </=13 seconds to improve her functional strength.  Baseline: see above  Goal status: INITIAL  3.  Patient will report pain at worst rated as </=6/10 to reduce her current functional limitations.  Baseline: see above  Goal status: INITIAL   LONG TERM GOALS: Target date: 12/03/22  Patient will score at least 58% on FOTO to signify clinically meaningful improvement in functional abilities.   Baseline: see above Goal status: INITIAL  2.  Patient will tolerate at least 30 minutes of continuous standing activity to improve her ability to play with her children.  Baseline: see above Goal status: INITIAL  3.  Patient will demonstrate 5/5 bilateral hip strength to improve stability about the chain with prolonged walking/standing activity.  Baseline: see above Goal status: INITIAL  4.  Patient will be independent with advanced home program to assist in management of her chronic condition.  Baseline: see above Goal status: INITIAL  5. Patient will be able to lift at least 15 lbs with proper form without onset of back pain.   Baseline: see above  Goal status: INITIAL     PLAN:  PT FREQUENCY: n/a  PT DURATION: n/a  PLANNED INTERVENTIONS: Therapeutic exercises, Therapeutic activity, Neuromuscular re-education, Balance training, Gait training, Patient/Family education, Self Care, Aquatic Therapy, Dry Needling, Electrical stimulation, Cryotherapy, Moist heat, Manual therapy, and Re-evaluation.  PLAN FOR NEXT SESSION: n/a  Letitia Libra, PT, DPT, ATC 10/04/22 1:48 PM  Letitia Libra, PT, DPT, ATC 11/14/22 11:37 AM

## 2022-10-19 ENCOUNTER — Ambulatory Visit: Payer: Medicaid Other

## 2022-10-25 ENCOUNTER — Ambulatory Visit: Payer: Medicaid Other | Admitting: Physical Therapy

## 2022-10-27 ENCOUNTER — Encounter: Payer: Medicaid Other | Admitting: Physical Therapy

## 2022-11-01 ENCOUNTER — Ambulatory Visit: Payer: Medicaid Other

## 2022-11-04 ENCOUNTER — Encounter: Payer: Medicaid Other | Admitting: Physical Therapy

## 2022-11-08 ENCOUNTER — Ambulatory Visit: Payer: Medicaid Other | Admitting: Physical Therapy

## 2022-12-09 ENCOUNTER — Other Ambulatory Visit: Payer: Self-pay | Admitting: Internal Medicine

## 2022-12-11 ENCOUNTER — Emergency Department: Payer: Medicaid Other

## 2022-12-11 ENCOUNTER — Emergency Department
Admission: EM | Admit: 2022-12-11 | Discharge: 2022-12-11 | Disposition: A | Discharge status: Home / Self Care | Payer: Medicaid Other | Attending: Emergency Medicine | Admitting: Emergency Medicine

## 2022-12-11 ENCOUNTER — Encounter: Payer: Self-pay | Admitting: Emergency Medicine

## 2022-12-11 ENCOUNTER — Other Ambulatory Visit: Payer: Self-pay

## 2022-12-11 DIAGNOSIS — K029 Dental caries, unspecified: Secondary | ICD-10-CM | POA: Insufficient documentation

## 2022-12-11 DIAGNOSIS — K0889 Other specified disorders of teeth and supporting structures: Secondary | ICD-10-CM | POA: Diagnosis present

## 2022-12-11 DIAGNOSIS — Z98818 Other dental procedure status: Secondary | ICD-10-CM | POA: Insufficient documentation

## 2022-12-11 DIAGNOSIS — D72829 Elevated white blood cell count, unspecified: Secondary | ICD-10-CM | POA: Insufficient documentation

## 2022-12-11 DIAGNOSIS — K08409 Partial loss of teeth, unspecified cause, unspecified class: Secondary | ICD-10-CM

## 2022-12-11 LAB — BASIC METABOLIC PANEL
Anion gap: 10 (ref 5–15)
BUN: 11 mg/dL (ref 6–20)
CO2: 23 mmol/L (ref 22–32)
Calcium: 8.6 mg/dL — ABNORMAL LOW (ref 8.9–10.3)
Chloride: 102 mmol/L (ref 98–111)
Creatinine, Ser: 0.54 mg/dL (ref 0.44–1.00)
GFR, Estimated: >60 mL/min (ref >60)
Glucose, Bld: 192 mg/dL — ABNORMAL HIGH (ref 70–99)
Potassium: 4 mmol/L (ref 3.5–5.1)
Sodium: 135 mmol/L (ref 135–145)

## 2022-12-11 LAB — CBC
HCT: 45.9 % (ref 36.0–46.0)
Hemoglobin: 14.6 g/dL (ref 12.0–15.0)
MCH: 25.8 pg — ABNORMAL LOW (ref 26.0–34.0)
MCHC: 31.8 g/dL (ref 30.0–36.0)
MCV: 81.2 fL (ref 80.0–100.0)
Platelets: 269 10*3/uL (ref 150–400)
RBC: 5.65 MIL/uL — ABNORMAL HIGH (ref 3.87–5.11)
RDW: 14.9 % (ref 11.5–15.5)
WBC: 11.8 10*3/uL — ABNORMAL HIGH (ref 4.0–10.5)
nRBC: 0 % (ref 0.0–0.2)

## 2022-12-11 MED ORDER — HYDROCODONE-ACETAMINOPHEN 5-325 MG PO TABS: 1.0000 | ORAL_TABLET | Freq: Four times a day (QID) | ORAL | 0 refills | Status: DC | PRN

## 2022-12-11 MED ORDER — IOHEXOL 300 MG/ML  SOLN
75.0000 mL | Freq: Once | INTRAMUSCULAR | Status: AC | PRN
  Administered 2022-12-11: 75 mL via INTRAVENOUS

## 2022-12-11 NOTE — Discharge Instructions (Signed)
Call your dentist tomorrow to discuss follow-up evaluation. A prescription was sent to the pharmacy for you begin taking as needed for pain.  Continue eating soft foods and drinking fluids to stay hydrated.

## 2022-12-11 NOTE — ED Triage Notes (Signed)
Pt c/o dental pain "for a while". Pt states was seen at [facility] and was prescribed abx, pt states had tooth pulled on Friday, has continued with abx since then. Pt c/o knot to L lower jaw that developed prior to having tooth pulled, and pain from knot on jaw that radiates to her L ear. Pt states difficulty opening her mouth at this time. Pt also c/o elevated blood sugars.

## 2022-12-11 NOTE — ED Provider Notes (Signed)
[facility] Provider Note    Event Date/Time   First MD Initiated Contact with Patient 12/11/22 [phone]     (approximate)   History   Dental Pain   HPI  Kathleen Walsh is a 42 y.o. female   presents to the ED with complaint of dental pain with swelling above and below and a "knot sensation".  Patient states she has been taking clindamycin for the last 5 days and that her tooth was pulled 2 days ago.  Since that time she is had increased pain.  She denies any fever or chills.       Physical Exam   Triage Vital Signs: ED Triage Vitals  Encounter Vitals Group     BP 12/11/22 0447 (!) 143/64     Systolic BP Percentile --      Diastolic BP Percentile --      Pulse Rate 12/11/22 0447 96     Resp 12/11/22 0447 20     Temp 12/11/22 0447 98.8 F (37.1 C)     Temp Source 12/11/22 0447 Oral     SpO2 12/11/22 0447 93 %     Weight 12/11/22 0454 (!) 330 lb (149.7 kg)     Height 12/11/22 0454 5\' 6"  (1.676 m)     Head Circumference --      Peak Flow --      Pain Score 12/11/22 0453 8     Pain Loc --      Pain Education --      Exclude from Growth Chart --     Most recent vital signs: Vitals:   12/11/22 0447 12/11/22 0852  BP: (!) 143/64 138/60  Pulse: 96 92  Resp: 20 18  Temp: 98.8 F (37.1 C) 98 F (36.7 C)  SpO2: 93% 94%     General: Awake, no distress.  Talkative, able to speak in complete sentences without difficulty CV:  Good peripheral perfusion.  Resp:  Normal effort.  Abd:  No distention.  Other:  Left lower gums edematous with an extraction left molar.  Posterior molars and wisdom tooth and very poor repair.  Premolars with multiple caries present.  No active drainage is noted.  Neck is supple with tender left cervical lymphadenopathy.  Skin is intact without discoloration.   ED Results / Procedures / Treatments   Labs (all labs ordered are listed, but only abnormal results are displayed) Labs Reviewed  CBC - Abnormal; Notable for  the following components:      Result Value   WBC 11.8 (*)    RBC 5.65 (*)    MCH 25.8 (*)    All other components within normal limits  BASIC METABOLIC PANEL - Abnormal; Notable for the following components:   Glucose, Bld 192 (*)    Calcium 8.6 (*)    All other components within normal limits      RADIOLOGY CT soft tissue neck with contrast per radiologist is negative for localized abscess.  Mild regional cellulitis with noted extraction left lower molar.  No osseous changes.    PROCEDURES:  Critical Care performed:   Procedures   MEDICATIONS ORDERED IN ED: Medications  iohexol (OMNIPAQUE) 300 MG/ML solution 75 mL (75 mLs Intravenous Contrast Given 12/11/22 0817)     IMPRESSION / MDM / ASSESSMENT AND PLAN / ED COURSE  I reviewed the triage vital signs and the nursing notes.   Differential diagnosis includes, but is not limited to, dental abscess, Ludwig angina, osteomyelitis left  mandible, dental pain secondary to extraction, remaining multiple dental caries.  42 year old female presents to the ED with complaint of left lower mouth pain secondary to extraction of a molar with the need for additional dental work and multiple caries present.  Patient currently is taking clindamycin.  She states that no pain medication was given.  Patient was able to talk in complete sentences without any difficulty.  CT scan was negative for localized abscess.  CBC showed mild elevation of WBC at 11.8.  Glucose 192 with patient made aware.  She states that she has mostly been eating carbs that are soft such as mashed potatoes and will have her PCP repeat her glucose.      Patient's presentation is most consistent with acute complicated illness / injury requiring diagnostic workup.  FINAL CLINICAL IMPRESSION(S) / ED DIAGNOSES   Final diagnoses:  Pain due to dental caries  Status post tooth extraction     Rx / DC Orders   ED Discharge Orders          Ordered     HYDROcodone-acetaminophen (NORCO/VICODIN) 5-325 MG tablet  Every 6 hours PRN        12/11/22 0939             Note:  This document was prepared using Dragon voice recognition software and may include unintentional dictation errors.   Tommi Rumps, PA-C 12/11/22 1450    Minna Antis, MD 12/11/22 1506

## 2022-12-14 ENCOUNTER — Other Ambulatory Visit: Payer: Self-pay

## 2022-12-14 ENCOUNTER — Encounter (HOSPITAL_COMMUNITY): Payer: Self-pay

## 2022-12-14 ENCOUNTER — Inpatient Hospital Stay (HOSPITAL_COMMUNITY)
Admission: EM | Admit: 2022-12-14 | Discharge: 2022-12-21 | DRG: 863 | Disposition: A | Discharge status: Home / Self Care | Payer: Medicaid Other | Attending: Internal Medicine | Admitting: Internal Medicine

## 2022-12-14 DIAGNOSIS — Z6841 Body Mass Index (BMI) 40.0 and over, adult: Secondary | ICD-10-CM

## 2022-12-14 DIAGNOSIS — J0430 Supraglottitis, unspecified, without obstruction: Secondary | ICD-10-CM | POA: Diagnosis present

## 2022-12-14 DIAGNOSIS — E785 Hyperlipidemia, unspecified: Secondary | ICD-10-CM | POA: Diagnosis present

## 2022-12-14 DIAGNOSIS — Z806 Family history of leukemia: Secondary | ICD-10-CM

## 2022-12-14 DIAGNOSIS — Z832 Family history of diseases of the blood and blood-forming organs and certain disorders involving the immune mechanism: Secondary | ICD-10-CM

## 2022-12-14 DIAGNOSIS — Z888 Allergy status to other drugs, medicaments and biological substances status: Secondary | ICD-10-CM

## 2022-12-14 DIAGNOSIS — Z880 Allergy status to penicillin: Secondary | ICD-10-CM

## 2022-12-14 DIAGNOSIS — E876 Hypokalemia: Secondary | ICD-10-CM | POA: Diagnosis present

## 2022-12-14 DIAGNOSIS — T8143XA Infection following a procedure, organ and space surgical site, initial encounter: Principal | ICD-10-CM | POA: Diagnosis present

## 2022-12-14 DIAGNOSIS — F32A Depression, unspecified: Secondary | ICD-10-CM | POA: Diagnosis present

## 2022-12-14 DIAGNOSIS — Z803 Family history of malignant neoplasm of breast: Secondary | ICD-10-CM

## 2022-12-14 DIAGNOSIS — I10 Essential (primary) hypertension: Secondary | ICD-10-CM | POA: Diagnosis present

## 2022-12-14 DIAGNOSIS — R232 Flushing: Secondary | ICD-10-CM | POA: Diagnosis present

## 2022-12-14 DIAGNOSIS — F419 Anxiety disorder, unspecified: Secondary | ICD-10-CM | POA: Diagnosis present

## 2022-12-14 DIAGNOSIS — K05219 Aggressive periodontitis, localized, unspecified severity: Secondary | ICD-10-CM | POA: Diagnosis present

## 2022-12-14 DIAGNOSIS — K122 Cellulitis and abscess of mouth: Secondary | ICD-10-CM | POA: Diagnosis present

## 2022-12-14 DIAGNOSIS — Z8249 Family history of ischemic heart disease and other diseases of the circulatory system: Secondary | ICD-10-CM

## 2022-12-14 DIAGNOSIS — Z7985 Long-term (current) use of injectable non-insulin antidiabetic drugs: Secondary | ICD-10-CM

## 2022-12-14 DIAGNOSIS — Z8042 Family history of malignant neoplasm of prostate: Secondary | ICD-10-CM

## 2022-12-14 DIAGNOSIS — Z83719 Family history of colon polyps, unspecified: Secondary | ICD-10-CM

## 2022-12-14 DIAGNOSIS — D6861 Antiphospholipid syndrome: Secondary | ICD-10-CM | POA: Diagnosis present

## 2022-12-14 DIAGNOSIS — K047 Periapical abscess without sinus: Secondary | ICD-10-CM

## 2022-12-14 DIAGNOSIS — M272 Inflammatory conditions of jaws: Secondary | ICD-10-CM | POA: Diagnosis present

## 2022-12-14 DIAGNOSIS — Z79899 Other long term (current) drug therapy: Secondary | ICD-10-CM

## 2022-12-14 DIAGNOSIS — Z833 Family history of diabetes mellitus: Secondary | ICD-10-CM

## 2022-12-14 DIAGNOSIS — R252 Cramp and spasm: Secondary | ICD-10-CM | POA: Diagnosis present

## 2022-12-14 DIAGNOSIS — G4733 Obstructive sleep apnea (adult) (pediatric): Secondary | ICD-10-CM | POA: Diagnosis present

## 2022-12-14 DIAGNOSIS — E1165 Type 2 diabetes mellitus with hyperglycemia: Secondary | ICD-10-CM | POA: Diagnosis present

## 2022-12-14 DIAGNOSIS — J36 Peritonsillar abscess: Secondary | ICD-10-CM

## 2022-12-14 DIAGNOSIS — Y849 Medical procedure, unspecified as the cause of abnormal reaction of the patient, or of later complication, without mention of misadventure at the time of the procedure: Secondary | ICD-10-CM | POA: Diagnosis present

## 2022-12-14 DIAGNOSIS — Z794 Long term (current) use of insulin: Secondary | ICD-10-CM

## 2022-12-14 DIAGNOSIS — R609 Edema, unspecified: Secondary | ICD-10-CM | POA: Diagnosis present

## 2022-12-14 DIAGNOSIS — R001 Bradycardia, unspecified: Secondary | ICD-10-CM | POA: Diagnosis not present

## 2022-12-14 DIAGNOSIS — Z90710 Acquired absence of both cervix and uterus: Secondary | ICD-10-CM

## 2022-12-14 DIAGNOSIS — Z87891 Personal history of nicotine dependence: Secondary | ICD-10-CM

## 2022-12-14 NOTE — ED Triage Notes (Signed)
Patient BIB [facility] from home due to dental pain and swelling into the neck. Patient had a tooth on left side pulled on Friday. Patient been on antibiotics since last Wednesday. Patient was seen at [facility] on Monday and was given toradol. Patient saw PCP this morning and was given more toradol. Patient saw dentist this morning as well and was told everything was healing fine. No troubles breathing. Pain 10/10. Patient is A&Ox4.

## 2022-12-15 ENCOUNTER — Emergency Department (HOSPITAL_COMMUNITY): Payer: Medicaid Other

## 2022-12-15 ENCOUNTER — Encounter (HOSPITAL_COMMUNITY): Payer: Self-pay | Admitting: Critical Care Medicine

## 2022-12-15 DIAGNOSIS — F32A Depression, unspecified: Secondary | ICD-10-CM | POA: Diagnosis present

## 2022-12-15 DIAGNOSIS — D6861 Antiphospholipid syndrome: Secondary | ICD-10-CM | POA: Diagnosis present

## 2022-12-15 DIAGNOSIS — Z833 Family history of diabetes mellitus: Secondary | ICD-10-CM | POA: Diagnosis not present

## 2022-12-15 DIAGNOSIS — E876 Hypokalemia: Secondary | ICD-10-CM | POA: Diagnosis present

## 2022-12-15 DIAGNOSIS — R609 Edema, unspecified: Secondary | ICD-10-CM | POA: Diagnosis present

## 2022-12-15 DIAGNOSIS — R252 Cramp and spasm: Secondary | ICD-10-CM | POA: Diagnosis present

## 2022-12-15 DIAGNOSIS — M272 Inflammatory conditions of jaws: Secondary | ICD-10-CM | POA: Diagnosis present

## 2022-12-15 DIAGNOSIS — K122 Cellulitis and abscess of mouth: Secondary | ICD-10-CM | POA: Diagnosis present

## 2022-12-15 DIAGNOSIS — Z794 Long term (current) use of insulin: Secondary | ICD-10-CM | POA: Diagnosis not present

## 2022-12-15 DIAGNOSIS — G4733 Obstructive sleep apnea (adult) (pediatric): Secondary | ICD-10-CM | POA: Diagnosis present

## 2022-12-15 DIAGNOSIS — Z8249 Family history of ischemic heart disease and other diseases of the circulatory system: Secondary | ICD-10-CM | POA: Diagnosis not present

## 2022-12-15 DIAGNOSIS — R232 Flushing: Secondary | ICD-10-CM | POA: Diagnosis present

## 2022-12-15 DIAGNOSIS — I10 Essential (primary) hypertension: Secondary | ICD-10-CM | POA: Diagnosis present

## 2022-12-15 DIAGNOSIS — R001 Bradycardia, unspecified: Secondary | ICD-10-CM | POA: Diagnosis not present

## 2022-12-15 DIAGNOSIS — J36 Peritonsillar abscess: Secondary | ICD-10-CM | POA: Diagnosis present

## 2022-12-15 DIAGNOSIS — Z79899 Other long term (current) drug therapy: Secondary | ICD-10-CM | POA: Diagnosis not present

## 2022-12-15 DIAGNOSIS — E785 Hyperlipidemia, unspecified: Secondary | ICD-10-CM | POA: Diagnosis present

## 2022-12-15 DIAGNOSIS — Z7985 Long-term (current) use of injectable non-insulin antidiabetic drugs: Secondary | ICD-10-CM | POA: Diagnosis not present

## 2022-12-15 DIAGNOSIS — K05219 Aggressive periodontitis, localized, unspecified severity: Secondary | ICD-10-CM | POA: Diagnosis present

## 2022-12-15 DIAGNOSIS — Y849 Medical procedure, unspecified as the cause of abnormal reaction of the patient, or of later complication, without mention of misadventure at the time of the procedure: Secondary | ICD-10-CM | POA: Diagnosis present

## 2022-12-15 DIAGNOSIS — Z87891 Personal history of nicotine dependence: Secondary | ICD-10-CM | POA: Diagnosis not present

## 2022-12-15 DIAGNOSIS — K047 Periapical abscess without sinus: Secondary | ICD-10-CM | POA: Diagnosis not present

## 2022-12-15 DIAGNOSIS — Z6841 Body Mass Index (BMI) 40.0 and over, adult: Secondary | ICD-10-CM | POA: Diagnosis not present

## 2022-12-15 DIAGNOSIS — E1165 Type 2 diabetes mellitus with hyperglycemia: Secondary | ICD-10-CM | POA: Diagnosis present

## 2022-12-15 DIAGNOSIS — J0430 Supraglottitis, unspecified, without obstruction: Secondary | ICD-10-CM

## 2022-12-15 DIAGNOSIS — T8143XA Infection following a procedure, organ and space surgical site, initial encounter: Secondary | ICD-10-CM | POA: Diagnosis present

## 2022-12-15 LAB — CBC
HCT: 42 % (ref 36.0–46.0)
Hemoglobin: 13.2 g/dL (ref 12.0–15.0)
MCH: 25.4 pg — ABNORMAL LOW (ref 26.0–34.0)
MCHC: 31.4 g/dL (ref 30.0–36.0)
MCV: 80.9 fL (ref 80.0–100.0)
Platelets: 234 10*3/uL (ref 150–400)
RBC: 5.19 MIL/uL — ABNORMAL HIGH (ref 3.87–5.11)
RDW: 15.1 % (ref 11.5–15.5)
WBC: 19 10*3/uL — ABNORMAL HIGH (ref 4.0–10.5)
nRBC: 0 % (ref 0.0–0.2)

## 2022-12-15 LAB — HIV ANTIBODY (ROUTINE TESTING W REFLEX): HIV Screen 4th Generation wRfx: NONREACTIVE

## 2022-12-15 LAB — CBC WITH DIFFERENTIAL/PLATELET
Abs Immature Granulocytes: 0.14 10*3/uL — ABNORMAL HIGH (ref 0.00–0.07)
Basophils Absolute: 0.1 10*3/uL (ref 0.0–0.1)
Basophils Relative: 0 %
Eosinophils Absolute: 0 10*3/uL (ref 0.0–0.5)
Eosinophils Relative: 0 %
HCT: 44.4 % (ref 36.0–46.0)
Hemoglobin: 14.1 g/dL (ref 12.0–15.0)
Immature Granulocytes: 1 %
Lymphocytes Relative: 4 %
Lymphs Abs: 0.6 10*3/uL — ABNORMAL LOW (ref 0.7–4.0)
MCH: 26.6 pg (ref 26.0–34.0)
MCHC: 31.8 g/dL (ref 30.0–36.0)
MCV: 83.6 fL (ref 80.0–100.0)
Monocytes Absolute: 0.4 10*3/uL (ref 0.1–1.0)
Monocytes Relative: 2 %
Neutro Abs: 14 10*3/uL — ABNORMAL HIGH (ref 1.7–7.7)
Neutrophils Relative %: 93 %
Platelets: 243 10*3/uL (ref 150–400)
RBC: 5.31 MIL/uL — ABNORMAL HIGH (ref 3.87–5.11)
RDW: 15.1 % (ref 11.5–15.5)
WBC: 15.2 10*3/uL — ABNORMAL HIGH (ref 4.0–10.5)
nRBC: 0 % (ref 0.0–0.2)

## 2022-12-15 LAB — BASIC METABOLIC PANEL
Anion gap: 10 (ref 5–15)
BUN: 12 mg/dL (ref 6–20)
CO2: 22 mmol/L (ref 22–32)
Calcium: 8.6 mg/dL — ABNORMAL LOW (ref 8.9–10.3)
Chloride: 104 mmol/L (ref 98–111)
Creatinine, Ser: 0.7 mg/dL (ref 0.44–1.00)
GFR, Estimated: >60 mL/min (ref >60)
Glucose, Bld: 219 mg/dL — ABNORMAL HIGH (ref 70–99)
Potassium: 3.9 mmol/L (ref 3.5–5.1)
Sodium: 136 mmol/L (ref 135–145)

## 2022-12-15 LAB — GLUCOSE, CAPILLARY
Glucose-Capillary: 236 mg/dL — ABNORMAL HIGH (ref 70–99)
Glucose-Capillary: 238 mg/dL — ABNORMAL HIGH (ref 70–99)
Glucose-Capillary: 254 mg/dL — ABNORMAL HIGH (ref 70–99)
Glucose-Capillary: 254 mg/dL — ABNORMAL HIGH (ref 70–99)
Glucose-Capillary: 270 mg/dL — ABNORMAL HIGH (ref 70–99)

## 2022-12-15 LAB — CREATININE, SERUM
Creatinine, Ser: 0.68 mg/dL (ref 0.44–1.00)
GFR, Estimated: >60 mL/min (ref >60)

## 2022-12-15 LAB — MRSA NEXT GEN BY PCR, NASAL: MRSA by PCR Next Gen: NOT DETECTED

## 2022-12-15 LAB — HEMOGLOBIN A1C
Hgb A1c MFr Bld: 8.2 % — ABNORMAL HIGH (ref 4.8–5.6)
Mean Plasma Glucose: 188.64 mg/dL

## 2022-12-15 MED ORDER — POLYETHYLENE GLYCOL 3350 17 G PO PACK: 17.0000 g | PACK | Freq: Every day | ORAL | Status: DC | PRN

## 2022-12-15 MED ORDER — HEPARIN SODIUM (PORCINE) 5000 UNIT/ML IJ SOLN
5000.0000 [IU] | Freq: Three times a day (TID) | INTRAMUSCULAR | Status: DC
  Administered 2022-12-15 – 2022-12-19 (×11): 5000 [IU] via SUBCUTANEOUS
  Filled 2022-12-15 (×11): qty 1

## 2022-12-15 MED ORDER — DOCUSATE SODIUM 100 MG PO CAPS
100.0000 mg | ORAL_CAPSULE | Freq: Two times a day (BID) | ORAL | Status: DC | PRN
  Administered 2022-12-20: 100 mg via ORAL
  Filled 2022-12-15 (×2): qty 1

## 2022-12-15 MED ORDER — INSULIN ASPART 100 UNIT/ML IJ SOLN
0.0000 [IU] | INTRAMUSCULAR | Status: DC
  Administered 2022-12-15: 11 [IU] via SUBCUTANEOUS
  Administered 2022-12-15: 7 [IU] via SUBCUTANEOUS
  Administered 2022-12-15: 11 [IU] via SUBCUTANEOUS
  Administered 2022-12-15: 7 [IU] via SUBCUTANEOUS
  Administered 2022-12-15: 11 [IU] via SUBCUTANEOUS
  Administered 2022-12-16: 7 [IU] via SUBCUTANEOUS
  Administered 2022-12-16: 11 [IU] via SUBCUTANEOUS

## 2022-12-15 MED ORDER — LACTATED RINGERS IV SOLN: INTRAVENOUS | Status: DC

## 2022-12-15 MED ORDER — PIPERACILLIN-TAZOBACTAM 3.375 G IVPB 30 MIN
3.3750 g | Freq: Once | INTRAVENOUS | Status: AC
  Administered 2022-12-15: 3.375 g via INTRAVENOUS
  Filled 2022-12-15: qty 50

## 2022-12-15 MED ORDER — PIPERACILLIN-TAZOBACTAM 3.375 G IVPB
3.3750 g | Freq: Three times a day (TID) | INTRAVENOUS | Status: DC
  Administered 2022-12-15 – 2022-12-16 (×3): 3.375 g via INTRAVENOUS
  Filled 2022-12-15 (×3): qty 50

## 2022-12-15 MED ORDER — ORAL CARE MOUTH RINSE
15.0000 mL | OROMUCOSAL | Status: DC
  Administered 2022-12-15 – 2022-12-21 (×20): 15 mL via OROMUCOSAL

## 2022-12-15 MED ORDER — CETIRIZINE HCL 10 MG PO TABS
5.0000 mg | ORAL_TABLET | Freq: Every day | ORAL | Status: DC | PRN
  Administered 2022-12-16: 5 mg via ORAL
  Filled 2022-12-15 (×2): qty 1

## 2022-12-15 MED ORDER — ROSUVASTATIN CALCIUM 20 MG PO TABS
20.0000 mg | ORAL_TABLET | Freq: Every day | ORAL | Status: DC
  Filled 2022-12-15 (×2): qty 1

## 2022-12-15 MED ORDER — HYDROMORPHONE HCL 1 MG/ML IJ SOLN
0.5000 mg | Freq: Once | INTRAMUSCULAR | Status: AC
  Administered 2022-12-15: 0.5 mg via INTRAVENOUS
  Filled 2022-12-15: qty 1

## 2022-12-15 MED ORDER — FENTANYL CITRATE PF 50 MCG/ML IJ SOSY
25.0000 ug | PREFILLED_SYRINGE | INTRAMUSCULAR | Status: DC | PRN
  Administered 2022-12-15 – 2022-12-16 (×3): 25 ug via INTRAVENOUS
  Filled 2022-12-15 (×3): qty 1

## 2022-12-15 MED ORDER — IOHEXOL 350 MG/ML SOLN
75.0000 mL | Freq: Once | INTRAVENOUS | Status: AC | PRN
  Administered 2022-12-15: 75 mL via INTRAVENOUS

## 2022-12-15 MED ORDER — CHLORHEXIDINE GLUCONATE CLOTH 2 % EX PADS
6.0000 | MEDICATED_PAD | Freq: Every day | CUTANEOUS | Status: DC
  Administered 2022-12-15 – 2022-12-20 (×6): 6 via TOPICAL

## 2022-12-15 MED ORDER — SODIUM CHLORIDE 0.9 % IV BOLUS
1000.0000 mL | Freq: Once | INTRAVENOUS | Status: AC
  Administered 2022-12-15: 1000 mL via INTRAVENOUS

## 2022-12-15 MED ORDER — DEXAMETHASONE SODIUM PHOSPHATE 10 MG/ML IJ SOLN
8.0000 mg | Freq: Once | INTRAMUSCULAR | Status: AC
  Administered 2022-12-15: 8 mg via INTRAVENOUS
  Filled 2022-12-15: qty 1

## 2022-12-15 MED ORDER — ORAL CARE MOUTH RINSE: 15.0000 mL | OROMUCOSAL | Status: DC | PRN

## 2022-12-15 MED ORDER — HYDROMORPHONE HCL 1 MG/ML IJ SOLN
1.0000 mg | Freq: Once | INTRAMUSCULAR | Status: AC
  Administered 2022-12-15: 1 mg via INTRAVENOUS
  Filled 2022-12-15: qty 1

## 2022-12-15 MED ORDER — VANCOMYCIN HCL 1500 MG/300ML IV SOLN
1500.0000 mg | Freq: Two times a day (BID) | INTRAVENOUS | Status: DC
  Filled 2022-12-15: qty 300

## 2022-12-15 MED ORDER — ONDANSETRON HCL 4 MG/2ML IJ SOLN
4.0000 mg | Freq: Once | INTRAMUSCULAR | Status: AC
  Administered 2022-12-15: 4 mg via INTRAVENOUS
  Filled 2022-12-15: qty 2

## 2022-12-15 MED ORDER — DEXAMETHASONE SODIUM PHOSPHATE 10 MG/ML IJ SOLN
8.0000 mg | Freq: Four times a day (QID) | INTRAMUSCULAR | Status: AC
  Administered 2022-12-15 (×4): 8 mg via INTRAVENOUS
  Filled 2022-12-15 (×4): qty 1

## 2022-12-15 MED ORDER — ONDANSETRON HCL 4 MG/2ML IJ SOLN
4.0000 mg | Freq: Four times a day (QID) | INTRAMUSCULAR | Status: DC | PRN
  Administered 2022-12-15 – 2022-12-21 (×4): 4 mg via INTRAVENOUS
  Filled 2022-12-15 (×4): qty 2

## 2022-12-15 MED ORDER — VANCOMYCIN HCL 1250 MG/250ML IV SOLN
1250.0000 mg | Freq: Two times a day (BID) | INTRAVENOUS | Status: DC
  Administered 2022-12-15 – 2022-12-16 (×3): 1250 mg via INTRAVENOUS
  Filled 2022-12-15 (×4): qty 250

## 2022-12-15 MED ORDER — INSULIN GLARGINE-YFGN 100 UNIT/ML ~~LOC~~ SOLN
20.0000 [IU] | Freq: Two times a day (BID) | SUBCUTANEOUS | Status: DC
  Administered 2022-12-15 – 2022-12-17 (×5): 20 [IU] via SUBCUTANEOUS
  Filled 2022-12-15 (×9): qty 0.2

## 2022-12-15 MED ORDER — KETOROLAC TROMETHAMINE 30 MG/ML IJ SOLN
30.0000 mg | Freq: Four times a day (QID) | INTRAMUSCULAR | Status: DC | PRN
  Administered 2022-12-15 – 2022-12-16 (×3): 30 mg via INTRAVENOUS
  Filled 2022-12-15 (×5): qty 1

## 2022-12-15 NOTE — Progress Notes (Signed)
Pharmacy Antibiotic Note  Kathleen Walsh is a 42 y.o. female admitted on 12/14/2022 with  oral infection/cellulitis .  Pharmacy has been consulted for Vancomycin/Zosyn dosing. Infection appears worsened on CT despite oral Clindamycin. WBC elevated. Renal function good.   Plan: Vancomycin 1500 mg IV q12h >>>Estimated AUC: 533 Zosyn 3.375G IV q8h to be infused over 4 hours Trend WBC, temp, renal function  F/U infectious work-up Drug levels as indicated   Height: 5\' 6"  (167.6 cm) Weight: (!) 149 kg (328 lb 7.8 oz) IBW/kg (Calculated) : 59.3  Temp (24hrs), Avg:97.7 F (36.5 C), Min:97.6 F (36.4 C), Max:97.8 F (36.6 C)  Recent Labs  Lab 12/11/22 0458 12/15/22 0208  WBC 11.8* 15.2*  CREATININE 0.54 0.70    Estimated Creatinine Clearance: 137.7 mL/min (by C-G formula based on SCr of 0.7 mg/dL).    Allergies  Allergen Reactions   Amoxicillin Nausea And Vomiting    Has patient had a PCN reaction causing immediate rash, facial/tongue/throat swelling, SOB or lightheadedness with hypotension: No Has patient had a PCN reaction causing severe rash involving mucus membranes or skin necrosis: No Has patient had a PCN reaction that required hospitalization: No Has patient had a PCN reaction occurring within the last 10 years: Yes If all of the above answers are "NO", then may proceed with Cephalosporin use.    Metformin And Related Nausea And Vomiting   Ozempic (0.25 Or 0.5 Mg-Dose) [Semaglutide(0.25 Or 0.5mg -Dos)]    Trulicity [Dulaglutide] Nausea And Vomiting    Abran Duke, PharmD, BCPS Clinical Pharmacist Phone: [phone]

## 2022-12-15 NOTE — ED Provider Notes (Signed)
[place] EMERGENCY DEPARTMENT AT [facility] Provider Note   [name]: [id number] Arrival date & time: 12/14/22  2343     History  Chief Complaint  Patient presents with   Oral Swelling    Kathleen Walsh is a 42 y.o. female.  HPI   Patient with medical history including anxiety, diabetes, sleep apnea, hypertension, presenting with complaints of worsening dental pain and neck swelling.  Patient states that she had her tooth extracted last Friday, states that since then she has been having worsening pain and swelling, she says she went to emergency department on the 11th and was told that she has some swollen "lymph nodes" but no evidence of infection, she was given steroids and was discharged home.  Patient states that she saw her dentist yesterday who states that her dental procedure appears to be healing well but upon getting home the swelling in her neck got worse and developed difficulty swallowing but denies any tongue swelling difficulty breathing, she denies any fevers or chills, states she is still able to tolerate p.o.  States she been taking antibiotics since Tuesday states she has been taking clindamycin twice daily has not missed any dosages.    Seen on the 11th, CT soft tissue neck with contrast shows inflammation adjacent left some manageable space without any evidence of drainable abscess or fluid collection.  Home Medications Prior to Admission medications   Medication Sig Start Date End Date Taking? Authorizing Provider  dapagliflozin propanediol (FARXIGA) 10 MG TABS tablet Take 10 mg by mouth daily.    [provider]  dicyclomine (BENTYL) 10 MG capsule Take 1 capsule (10 mg total) by mouth 3 (three) times daily as needed for spasms. And abdominal pain 12/20/21   Imogene Burn, MD  hydrochlorothiazide (HYDRODIURIL) 25 MG tablet Take 25 mg by mouth daily.    [provider]  HYDROcodone-acetaminophen (NORCO/VICODIN) 5-325 MG tablet Take 1 tablet  by mouth every 6 (six) hours as needed. 12/11/22 12/11/23  Tommi Rumps, PA-C  levocetirizine (XYZAL) 5 MG tablet Take 5 mg by mouth daily as needed for allergies.    [provider]  linaclotide Karlene Einstein) 145 MCG CAPS capsule Take 1 capsule (145 mcg total) by mouth daily before breakfast. 09/14/21   Imogene Burn, MD  lisinopril (ZESTRIL) 20 MG tablet Take 20 mg by mouth daily.    [provider]  metoCLOPramide (REGLAN) 5 MG tablet TAKE 1 TABLET BY MOUTH 3 TIMES DAILY BEFORE MEALS. 02/22/22   Imogene Burn, MD  ondansetron (ZOFRAN) 8 MG tablet TAKE 1 TABLET (8 MG TOTAL) BY MOUTH 3 (THREE) TIMES DAILY AS NEEDED FOR NAUSEA OR VOMITING. 03/28/22   Imogene Burn, MD  pantoprazole (PROTONIX) 40 MG tablet TAKE 1 TABLET (40 MG TOTAL) BY MOUTH TWICE A DAY BEFORE MEALS 12/09/22   Imogene Burn, MD  rizatriptan (MAXALT) 10 MG tablet Take 10 mg by mouth as needed for migraine. May repeat in 2 hours if needed    [provider]  rosuvastatin (CRESTOR) 20 MG tablet Take 20 mg by mouth daily.    [provider]  Simethicone (GAS-X PO) Take 1 tablet by mouth daily as needed (gas).    [provider]  TRULICITY 3 MG/0.5ML SOPN Inject 3 mg into the skin every Saturday. 11/09/21   [provider]      Allergies    Amoxicillin, Metformin and related, and Trulicity [dulaglutide]    Review of Systems  Review of Systems  Constitutional:  Negative for chills and fever.  HENT:  Positive for sore throat, trouble swallowing and voice change.   Respiratory:  Negative for shortness of breath.   Cardiovascular:  Negative for chest pain.  Gastrointestinal:  Negative for abdominal pain.  Neurological:  Negative for headaches.    Physical Exam Updated Vital Signs BP (!) 167/73   Pulse (!) 105   Temp 97.8 F (36.6 C) (Oral)   Resp 18   Ht 5\' 6"  (1.676 m)   Wt (!) 149 kg   LMP 08/31/2015   SpO2 95%   BMI 53.02 kg/m  Physical Exam Vitals and nursing  note reviewed.  Constitutional:      General: She is not in acute distress.    Appearance: She is not ill-appearing.  HENT:     Head: Normocephalic and atraumatic.     Nose: No congestion.     Mouth/Throat:     Mouth: Mucous membranes are moist.     Pharynx: Oropharynx is clear.     Comments: Patient has noted some erythema and edema no on the left aspect of the mandible, no trismus or torticollis, patient has pain with opening of her mouth but is able to do so, slight erythema noted around the left bottom molar, no obvious drainage or discharge no fluctuant induration on my exam, patient had notable edema left aspect the posterior pharynx, tongue and uvula are both midline, there is no submandibular swelling, she does have slight muffled on voice but she is controlling her oral secretions. Eyes:     Conjunctiva/sclera: Conjunctivae normal.  Cardiovascular:     Rate and Rhythm: Normal rate and regular rhythm.     Pulses: Normal pulses.     Heart sounds: No murmur heard.    No friction rub. No gallop.  Pulmonary:     Effort: No respiratory distress.     Breath sounds: No wheezing, rhonchi or rales.  Skin:    General: Skin is warm and dry.  Neurological:     Mental Status: She is alert.  Psychiatric:        Mood and Affect: Mood normal.     ED Results / Procedures / Treatments   Labs (all labs ordered are listed, but only abnormal results are displayed) Labs Reviewed  BASIC METABOLIC PANEL - Abnormal; Notable for the following components:      Result Value   Glucose, Bld 219 (*)    Calcium 8.6 (*)    All other components within normal limits  CBC WITH DIFFERENTIAL/PLATELET - Abnormal; Notable for the following components:   WBC 15.2 (*)    RBC 5.31 (*)    Neutro Abs 14.0 (*)    Lymphs Abs 0.6 (*)    Abs Immature Granulocytes 0.14 (*)    All other components within normal limits  CULTURE, BLOOD (ROUTINE X 2)  CULTURE, BLOOD (ROUTINE X 2)    EKG None  Radiology CT  Soft Tissue Neck W Contrast  Result Date: 12/15/2022 CLINICAL DATA:  Initial evaluation for acute dental pain, recent tooth extraction. EXAM: CT NECK WITH CONTRAST TECHNIQUE: Multidetector CT imaging of the neck was performed using the standard protocol following the bolus administration of intravenous contrast. RADIATION DOSE REDUCTION: This exam was performed according to the departmental dose-optimization program which includes automated exposure control, adjustment of the mA and/or kV according to patient size and/or use of iterative reconstruction technique. CONTRAST:  75mL OMNIPAQUE IOHEXOL 350 MG/ML SOLN COMPARISON:  Prior CT from 12/11/2022. FINDINGS: Pharynx and larynx: Sequelae of recent mandibular molar tooth extraction on the left. There has been interval worsening in regional soft tissue swelling with inflammatory changes about the left mandible, with involvement of the left masticator, parapharyngeal, and submandibular spaces. Changes have progressed and worsened from prior. Phlegmonous soft tissue density adjacent to the left mandible and left submandibular gland without discrete abscess. Additionally, now seen is mucosal edema and swelling involving the left pharynx. Uvula is swollen. Epiglottis is edematous and swollen. Changes compatible with concomitant supraglottitis. Mass effect on the supraglottic airway which is narrowed, measuring 5 mm in transverse diameter at its most narrow point (series 3, image 51). Scattered stranding within the retropharyngeal space without loculated collection. Glottis is fairly symmetric and within normal limits inferiorly. Subglottic airway patent clear. Salivary glands: Parotid and right submandibular glands remain within normal limits. Inflammatory changes involve and surround the left submandibular gland related to the adjacent inflammatory process within the left face. Thyroid: Normal. Lymph nodes: Few prominent left upper cervical lymph nodes are seen,  largest of which measures 1.3 cm in short axis at level 2. Findings are presumably reactive. Vascular: Normal intravascular enhancement seen within the neck. Limited intracranial: Unremarkable. Visualized orbits: Not included on this exam. Mastoids and visualized paranasal sinuses: Visualized paranasal sinuses are clear. Mastoid air cells and middle ear cavities are well pneumatized and free of fluid. Skeleton: No discrete or worrisome osseous lesions. No significant spondylosis within the cervical spine for age. Upper chest: No acute finding. Other: None. IMPRESSION: 1. Sequelae of recent mandibular molar tooth extraction on the left. Interval worsening in regional soft tissue swelling with inflammatory changes, concerning for worsened infection/cellulitis. Phlegmonous soft tissue density adjacent to the left mandible and left submandibular gland without discrete abscess or drainable fluid collection. 2. New mucosal edema and swelling about the left pharynx and supraglottic larynx with involvement of the epiglottis. Findings consistent with concomitant supraglottitis. Close clinical monitoring recommended as this patient could potentially be a risk for developing airway compromise. 3. Mildly enlarged left upper cervical lymph nodes, presumably reactive. Electronically Signed   By: Rise Mu M.D.   On: 12/15/2022 04:10    Procedures .Critical Care  Performed by: Carroll Sage, PA-C Authorized by: Carroll Sage, PA-C   Critical care provider statement:    Critical care time (minutes):  30   Critical care was time spent personally by me on the following activities:  Development of treatment plan with patient or surrogate, discussions with consultants, evaluation of patient's response to treatment, examination of patient, ordering and review of laboratory studies, ordering and review of radiographic studies, ordering and performing treatments and interventions, pulse oximetry,  re-evaluation of patient's condition and review of old charts   I assumed direction of critical care for this patient from another provider in my specialty: no     Care discussed with: admitting provider       Medications Ordered in ED Medications  dexamethasone (DECADRON) injection 8 mg (8 mg Intravenous Given 12/15/22 0146)  HYDROmorphone (DILAUDID) injection 0.5 mg (0.5 mg Intravenous Given 12/15/22 0144)  ondansetron (ZOFRAN) injection 4 mg (4 mg Intravenous Given 12/15/22 0144)  iohexol (OMNIPAQUE) 350 MG/ML injection 75 mL (75 mLs Intravenous Contrast Given 12/15/22 0330)  sodium chloride 0.9 % bolus 1,000 mL (1,000 mLs Intravenous New Bag/Given 12/15/22 0521)  HYDROmorphone (DILAUDID) injection 1 mg (1 mg Intravenous Given 12/15/22 0452)  piperacillin-tazobactam (ZOSYN) IVPB 3.375 g (3.375 g Intravenous New Bag/Given 12/15/22  Vestalis.Rams)    ED Course/ Medical Decision Making/ A&P                                 Medical Decision Making Amount and/or Complexity of Data Reviewed Labs: ordered. Radiology: ordered.  Risk Prescription drug management. Decision regarding hospitalization.   This patient presents to the ED for concern of neck swelling, this involves an extensive number of treatment options, and is a complaint that carries with it a high risk of complications and morbidity.  The differential diagnosis includes Ludwig angina, retropharyngeal/peritonsillar abscess, facial abscess    Additional history obtained:  Additional history obtained from N/A External records from outside source obtained and reviewed including ER notes   Co morbidities that complicate the patient evaluation  Obesity  Social Determinants of Health:  N/A    Lab Tests:  I Ordered, and personally interpreted labs.  The pertinent results include: CBC shows leukocytosis 15.2, BMP reveals a glucose of 219, calcium 8.6,   Imaging Studies ordered:  I ordered imaging studies including CT soft  tissue neck I independently visualized and interpreted imaging which showed reveals likely infection/cellulitis adjacent to the left mandible and submandibular gland without drainable abscess, there is noted edema around the left pharynx and supraglottic larynx, concern for supraglottitis. I agree with the radiologist interpretation   Cardiac Monitoring:  The patient was maintained on a cardiac monitor.  I personally viewed and interpreted the cardiac monitored which showed an underlying rhythm of: N/A   Medicines ordered and prescription drug management:  I ordered medication including fluids, pain medication, antibiotics, steroids I have reviewed the patients home medicines and have made adjustments as needed  Critical Interventions:  Airway compromise multiple reevaluations to ensure airway is not worsening.   Reevaluation:  Presents with facial swelling, she has a muffled tone voice, but is maintaining her oral airway, concerning for new/worsening infection, will obtain screening lab workup, imaging, continue to monitor  CT scan shows supraglottitis, reassessed the patient, she is still maintaining her oral airway, she is tolerating p.o., will consult with ENT for further recommendations  Updated patient these recommendations will consult with hospitalist for admission  Consultations Obtained:  I requested consultation with the Dr. Pollyann Kennedy,  and discussed lab and imaging findings as well as pertinent plan - they recommend: Agrees with hospital admission, keep her on antibiotics and steroids, will be available if airway intervention is needed. Spoke with critical care Dr. Karie Fetch she will admit the patient.    Test Considered:  N/a    Rule out I have low suspicion for peritonsillar abscess, retropharyngeal abscess, or Ludwig angina as oropharynx was visualized tongue and uvula were both midline, no submandibular swelling, CT imaging of these findings.  Low suspicion for  periorbital or orbital cellulitis as patient face had no erythematous, patient EOMs were intact, he had no pain with eye movement.     Dispostion and problem list  After consideration of the diagnostic results and the patients response to treatment, I feel that the patent would benefit from admission.  Dental infection-started on Zosyn, will need continued monitoring, possibly formal consultation by ENT to determine patient develops a drainable abscess.            Final Clinical Impression(s) / ED Diagnoses Final diagnoses:  Supraglottitis without airway obstruction  Dental infection    Rx / DC Orders ED Discharge Orders     None  Carroll Sage, PA-C 12/15/22 [id number]    Marily Memos, MD 12/15/22 [id number]

## 2022-12-15 NOTE — H&P (Signed)
NAME:  Kathleen Walsh, MRN:  [id number], DOB:  06-27-1980, LOS: 0 ADMISSION DATE:  12/14/2022, CONSULTATION DATE:  8/15 REFERRING MD:  PA Faulkner-ED, CHIEF COMPLAINT:  airway edema   History of Present Illness:  Kathleen Walsh is a 42 y/o woman with a history of DM who presented with swelling in the throat and developing SOB on the left side after getting a tooth pulled 6 days prior to presentation. She has had teeth pulled previously uneventfully, but this time began getting fullness in her throat and below her jaw on the left side. She is not on immunosuppressive medications. She has a history of lupus antiphospholipid antibody syndrome but is not on [facility]; she has only had clots and required AC during pregnancies. She has improved since coming to the ED- she does not feel that her throat is as swollen. She can swallow her secretions and has been able to talk. She has pain with swallowing. She does not feel SOB. ENT has been consulted. In the ED she received zosyn & dexamethasone.   Pertinent  Medical History  OSA HTN DM HLD History of lupus antiphospholipid antibody syndrome anemia  Significant Hospital Events: Including procedures, antibiotic start and stop dates in addition to other pertinent events   8/15 admitted  Interim History / Subjective:  Complaining of difficulty swallowing rather than any difficulty breathing.  ENT has evaluated and did not see airway compromise.   Objective   Blood pressure (!) 144/79, pulse 75, temperature 98.2 F (36.8 C), temperature source Oral, resp. rate (!) 31, height 5\' 6"  (1.676 m), weight (!) 149 kg, last menstrual period 08/31/2015, SpO2 96%.        Intake/Output Summary (Last 24 hours) at 12/15/2022 1108 Last data filed at 12/15/2022 1000 Gross per 24 hour  Intake 1400.72 ml  Output --  Net 1400.72 ml   Filed Weights   12/14/22 2357  Weight: (!) 149 kg    Examination: General: middle aged woman lying in bed in NAD HENT: [place]/AT, eyes  anicteric.  + Trismus. Some fullness from the left peritonsilar region. Induration and fullness below the left mandible. Able to manage oral secretions.  Lungs: No stridor. Breathing comfortably on RA, CTAB. No stridor or accessory muscle use.  Cardiovascular: S1S2, RRR Abdomen: obese, soft, NT Extremities: no cyanosis or edema Neuro: awake, alert, able to give an accurate and detailed history. Moving all extremities Derm: warm, dry.  Some red discoloration below left mandible.    Ancillary tests personally reviewed:   WBC 19 BUN 12 Cr 0.7 CT personally reviewed> airway narrowing from the left to varying degrees in the pharynx. Cervical adenopathy on the left.  HBA1C: 8.2% Assessment & Plan:   Peritonsillar infection, possibly abscess after having tooth extracted -Con't zosyn; add vanc -Admit to ICU due to concern for potential airway compromise. Clinically she is stable, but would be a challening airway if she progresses.  -con't dexamethasone q6h x 4 doses -ENT has been consulted by the ED-- [facility] -keep NPO for now, if continues to improve.   Poorly controlled DM type 2 -high dose SSI  -goal BG 140-180 -restart half dose of glargine insulin while NPO - Hold Farxiga, would be good GPL-1 candidate.   HTN -hold PTA hydrochlorothiazide and lisinopril  Obesity -long term recommend weight loss  HLD -con't crestor  Best Practice (right click and "Reselect all SmartList Selections" daily)   Diet/type: NPO DVT prophylaxis: prophylactic heparin  GI prophylaxis: N/A Lines: N/A Foley:  N/A Code Status:  full code Last date of multidisciplinary goals of care discussion [ Patient was updated at the bedside.]  Lynnell Catalan, MD [facility] ICU Physician [facility] [place] Critical [facility]  Pager: [phone] Or Epic Secure Chat After hours: [phone].  12/15/2022, 11:08 AM

## 2022-12-15 NOTE — Plan of Care (Signed)

## 2022-12-15 NOTE — Consult Note (Signed)
NAME:  Kathleen Walsh, MRN:  [id number], DOB:  10/17/1980, LOS: 0 ADMISSION DATE:  12/14/2022, CONSULTATION DATE:  8/15 REFERRING MD:  PA Faulkner-ED, CHIEF COMPLAINT:  airway edema   History of Present Illness:  Kathleen Walsh is a 42 y/o woman with a history of DM who presented with swelling in the throat and developing SOB on the left side after getting a tooth pulled 6 days prior to presentation. She has had teeth pulled previously uneventfully, but this time began getting fullness in her throat and below her jaw on the left side. She is not on immunosuppressive medications. She has a history of lupus antiphospholipid antibody syndrome but is not on [facility]; she has only had clots and required AC during pregnancies. She has improved since coming to the ED- she does not feel that her throat is as swollen. She can swallow her secretions and has been able to talk. She has pain with swallowing. She does not feel SOB. ENT has been consulted. In the ED she received zosyn & dexamethasone.   Pertinent  Medical History  OSA HTN DM HLD History of lupus antiphospholipid antibody syndrome anemia  Significant Hospital Events: Including procedures, antibiotic start and stop dates in addition to other pertinent events   8/15 admitted  Interim History / Subjective:    Objective   Blood pressure 138/71, pulse 89, temperature 97.8 F (36.6 C), temperature source Oral, resp. rate (!) 22, height 5\' 6"  (1.676 m), weight (!) 149 kg, last menstrual period 08/31/2015, SpO2 92%.        Intake/Output Summary (Last 24 hours) at 12/15/2022 0626 Last data filed at 12/15/2022 0556 Gross per 24 hour  Intake 49.19 ml  Output --  Net 49.19 ml   Filed Weights   12/14/22 2357  Weight: (!) 149 kg    Examination: General: middle aged woman lying in bed in NAD HENT: [place]/AT, eyes anicteric. Mallampati 4. + Trismus. Some fullness from the left peritonsilar region. Induration and fullness below the left mandible.  Lungs:  No stridor. Breathing comfortably on RA, CTAB. No stridor or accessory muscle use.  Cardiovascular: S1S2, RRR Abdomen: obese, soft, NT Extremities: no cyanosis or edema Neuro: awake, alert, able to give an accurate and detailed history. Moving all extremities Derm: warm, dry.  Some red discoloration below left mandible.   WBC 15.2 BUN 12 Cr 0.7 CT personally reviewed> airway narrowing from the left to varying degrees in the pharynx. Cervical adenopathy on the left.   Resolved Hospital Problem list     Assessment & Plan:  Peritonsilar infection, possibly abscess after having tooth extracted -Con't zosyn; add vanc -Admit to ICU due to concern for potential airway compromise. Clinically she is stable, but would be a challening airway if she progresses.  -con't dexamethasone q6h x 4 doses -ENT has been consulted by the ED-- [facility] -keep NPO  DM -high dose SSI  -goal BG 140-180  HTN -hold PTA hydrochlorothiazide and lisinopril  Obesity -long term recommend weight loss  HLD -con't crestor   Best Practice (right click and "Reselect all SmartList Selections" daily)   Diet/type: NPO DVT prophylaxis: prophylactic heparin  GI prophylaxis: N/A Lines: N/A Foley:  N/A Code Status:  full code Last date of multidisciplinary goals of care discussion [ ]   Labs   CBC: Recent Labs  Lab 12/11/22 0458 12/15/22 0208  WBC 11.8* 15.2*  NEUTROABS  --  14.0*  HGB 14.6 14.1  HCT 45.9 44.4  MCV 81.2 83.6  PLT 269 243    Basic Metabolic Panel: Recent Labs  Lab 12/11/22 0458 12/15/22 0208  NA 135 136  K 4.0 3.9  CL 102 104  CO2 23 22  GLUCOSE 192* 219*  BUN 11 12  CREATININE 0.54 0.70  CALCIUM 8.6* 8.6*   GFR: Estimated Creatinine Clearance: 137.7 mL/min (by C-G formula based on SCr of 0.7 mg/dL). Recent Labs  Lab 12/11/22 0458 12/15/22 0208  WBC 11.8* 15.2*    Liver Function Tests: No results for input(s): "AST", "ALT", "ALKPHOS", "BILITOT", "PROT", "ALBUMIN" in  the last 168 hours. No results for input(s): "LIPASE", "AMYLASE" in the last 168 hours. No results for input(s): "AMMONIA" in the last 168 hours.  ABG    Component Value Date/Time   PHART 7.418 09/02/2013 1329   PCO2ART 30.7 (L) 09/02/2013 1329   PO2ART 82.0 09/02/2013 1329   HCO3 19.8 (L) 09/02/2013 1329   TCO2 21 09/02/2013 1329   ACIDBASEDEF 4.0 (H) 09/02/2013 1329   O2SAT 96.0 09/02/2013 1329     Coagulation Profile: No results for input(s): "INR", "PROTIME" in the last 168 hours.  Cardiac Enzymes: No results for input(s): "CKTOTAL", "CKMB", "CKMBINDEX", "TROPONINI" in the last 168 hours.  HbA1C: Hgb A1c MFr Bld  Date/Time Value Ref Range Status  09/25/2019 10:10 AM 6.6 (H) 4.8 - 5.6 % Final    Comment:    (NOTE) Pre diabetes:          5.7%-6.4% Diabetes:              >6.4% Glycemic control for   <7.0% adults with diabetes     CBG: No results for input(s): "GLUCAP" in the last 168 hours.  Review of Systems:   Review of Systems  HENT:  Positive for sore throat.        Odynophagia  Respiratory:  Negative for shortness of breath.   Gastrointestinal: Negative.  Negative for nausea and vomiting.  Neurological:  Negative for focal weakness and weakness.     Past Medical History:  She,  has a past medical history of Abdominal wall pain, Anemia, Anxiety, Blood dyscrasia, Complication of anesthesia, Depression, Diabetes mellitus without complication (HCC), Headache(784.0), lupus anticoagulant disorder, Hyperlipidemia, Hypertension, Obesity, PONV (postoperative nausea and vomiting), and Sleep apnea.   Surgical History:   Past Surgical History:  Procedure Laterality Date   ABDOMINAL HYSTERECTOMY  2016   ANKLE ARTHROSCOPY WITH REPAIR SUBLUXING TENDON Left    BIOPSY  11/22/2021   Procedure: BIOPSY;  Surgeon: Imogene Burn, MD;  Location: [facility];  Service: Gastroenterology;;   CESAREAN SECTION WITH BILATERAL TUBAL LIGATION Bilateral 02/11/2013   Procedure:  Repeat CESAREAN SECTION WITH BILATERAL TUBAL LIGATION;  Surgeon: Lenoard Aden, MD;  Location: [facility];  Service: Obstetrics;  Laterality: Bilateral;  EDD: 03/01/13   COLONOSCOPY WITH PROPOFOL N/A 11/22/2021   Procedure: COLONOSCOPY WITH PROPOFOL;  Surgeon: Imogene Burn, MD;  Location: [facility];  Service: Gastroenterology;  Laterality: N/A;   DEBRIDEMENT OF ABDOMINAL WALL ABSCESS N/A 12/03/2014   Procedure: INCISION OF ABDOMINAL WALL LIPOMA;  Surgeon: Gaynelle Adu, MD;  Location: [facility];  Service: General;  Laterality: N/A;   DILATION AND CURETTAGE OF UTERUS  2004   DILITATION & CURRETTAGE/HYSTROSCOPY WITH NOVASURE ABLATION N/A 11/19/2014   Procedure: DILATATION & CURETTAGE/HYSTEROSCOPY WITH NOVASURE ABLATION;  Surgeon: Olivia Mackie, MD;  Location: [facility];  Service: Gynecology;  Laterality: N/A;   ENDOMETRIAL ABLATION  10/20/2014   ESOPHAGOGASTRODUODENOSCOPY (EGD) WITH PROPOFOL N/A 11/22/2021   Procedure: ESOPHAGOGASTRODUODENOSCOPY (  EGD) WITH PROPOFOL;  Surgeon: Imogene Burn, MD;  Location: [facility];  Service: Gastroenterology;  Laterality: N/A;   FOOT SURGERY  2008   HERNIA REPAIR  09/19/2011   LAPAROSCOPY N/A 12/03/2014   Procedure: LAPAROSCOPY DIAGNOSTIC WITH LYSIS OF ADHESIONS;  Surgeon: Gaynelle Adu, MD;  Location: [facility];  Service: General;  Laterality: N/A;   left shoulder surgery      NASAL SEPTUM SURGERY     POLYPECTOMY  11/22/2021   Procedure: POLYPECTOMY;  Surgeon: Imogene Burn, MD;  Location: [facility];  Service: Gastroenterology;;  EGD and COLON   TUBAL LIGATION     VENTRAL HERNIA REPAIR  09/20/2010   Laparoscopic, Dr Gaynelle Adu   WISDOM TOOTH EXTRACTION       Social History:   reports that she has quit smoking. Her smoking use included cigarettes. She started smoking about 5 years ago. She has a 17.3 pack-year smoking history. She has never used smokeless tobacco. She reports that she does not drink alcohol and does not use drugs.   Family History:  Her  family history includes Breast cancer in her maternal grandmother; Clotting disorder in her paternal grandmother; Colitis in her father; Colon polyps in her father; Crohn's disease in her maternal grandmother; Diabetes in her father and mother; Heart disease in her mother; Hypertension in her father and mother; Irritable bowel syndrome in her daughter and father; Leukemia in her paternal grandfather; Prostate cancer in her maternal grandfather. There is no history of Stomach cancer or Esophageal cancer.   Allergies Allergies  Allergen Reactions   Amoxicillin Nausea And Vomiting    Has patient had a PCN reaction causing immediate rash, facial/tongue/throat swelling, SOB or lightheadedness with hypotension: No Has patient had a PCN reaction causing severe rash involving mucus membranes or skin necrosis: No Has patient had a PCN reaction that required hospitalization: No Has patient had a PCN reaction occurring within the last 10 years: Yes If all of the above answers are "NO", then may proceed with Cephalosporin use.    Metformin And Related Nausea And Vomiting   Trulicity [Dulaglutide] Nausea And Vomiting     Home Medications  Prior to Admission medications   Medication Sig Start Date End Date Taking? Authorizing Provider  clindamycin (CLEOCIN) 300 MG capsule Take 300 mg by mouth every 8 (eight) hours. 12/07/22  Yes [provider]  LANTUS SOLOSTAR 100 UNIT/ML Solostar Pen Inject 24-42 Units into the skin daily. 23 units AM, 42 units PM 12/12/22  Yes [provider]  methocarbamol (ROBAXIN) 500 MG tablet Take 500 mg by mouth daily as needed for muscle spasms. 11/01/22  Yes [provider]  NOVOLOG FLEXPEN 100 UNIT/ML FlexPen Inject 2-10 Units into the skin 3 (three) times daily before meals. 11/07/22  Yes [provider]  rizatriptan (MAXALT-MLT) 10 MG disintegrating tablet Take 10 mg by mouth as needed for migraine. 11/10/22  Yes [provider]   dapagliflozin propanediol (FARXIGA) 10 MG TABS tablet Take 10 mg by mouth daily.    [provider]  dicyclomine (BENTYL) 10 MG capsule Take 1 capsule (10 mg total) by mouth 3 (three) times daily as needed for spasms. And abdominal pain 12/20/21   Imogene Burn, MD  hydrochlorothiazide (HYDRODIURIL) 25 MG tablet Take 25 mg by mouth daily.    [provider]  HYDROcodone-acetaminophen (NORCO/VICODIN) 5-325 MG tablet Take 1 tablet by mouth every 6 (six) hours as needed. 12/11/22 12/11/23  Tommi Rumps, PA-C  levocetirizine Elita Boone)  5 MG tablet Take 5 mg by mouth daily as needed for allergies.    [provider]  linaclotide Karlene Einstein) 145 MCG CAPS capsule Take 1 capsule (145 mcg total) by mouth daily before breakfast. 09/14/21   Imogene Burn, MD  lisinopril (ZESTRIL) 20 MG tablet Take 20 mg by mouth daily.    [provider]  metoCLOPramide (REGLAN) 5 MG tablet TAKE 1 TABLET BY MOUTH 3 TIMES DAILY BEFORE MEALS. 02/22/22   Imogene Burn, MD  ondansetron (ZOFRAN) 8 MG tablet TAKE 1 TABLET (8 MG TOTAL) BY MOUTH 3 (THREE) TIMES DAILY AS NEEDED FOR NAUSEA OR VOMITING. 03/28/22   Imogene Burn, MD  pantoprazole (PROTONIX) 40 MG tablet TAKE 1 TABLET (40 MG TOTAL) BY MOUTH TWICE A DAY BEFORE MEALS 12/09/22   Imogene Burn, MD  rizatriptan (MAXALT) 10 MG tablet Take 10 mg by mouth as needed for migraine. May repeat in 2 hours if needed    [provider]  rosuvastatin (CRESTOR) 20 MG tablet Take 20 mg by mouth daily.    [provider]  Simethicone (GAS-X PO) Take 1 tablet by mouth daily as needed (gas).    [provider]  TRULICITY 3 MG/0.5ML SOPN Inject 3 mg into the skin every Saturday. 11/09/21   [provider]     Critical care time:      Steffanie Dunn, DO 12/15/22 6:54 AM [facility]  For contact information, see Amion. If no response to pager, please call PCCM consult pager. After hours, 7PM- 7AM,  please call Elink.

## 2022-12-15 NOTE — ED Notes (Signed)
ED TO INPATIENT HANDOFF REPORT  ED Nurse Name and Phone #: Vernona Rieger [phone]  S Name/Age/Gender Kathleen Walsh 42 y.o. female Room/Bed: 035C/035C  Code Status   Code Status: Full Code  Home/SNF/Other Home Patient oriented to: self, place, time, and situation Is this baseline? Yes   Triage Complete: Triage complete  Chief Complaint Peritonsillar abscess [J36]  Triage Note Patient BIB [facility] from home due to dental pain and swelling into the neck. Patient had a tooth on left side pulled on Friday. Patient been on antibiotics since last Wednesday. Patient was seen at [facility] on Monday and was given toradol. Patient saw PCP this morning and was given more toradol. Patient saw dentist this morning as well and was told everything was healing fine. No troubles breathing. Pain 10/10. Patient is A&Ox4.   Allergies Allergies  Allergen Reactions   Amoxicillin Nausea And Vomiting    Has patient had a PCN reaction causing immediate rash, facial/tongue/throat swelling, SOB or lightheadedness with hypotension: No Has patient had a PCN reaction causing severe rash involving mucus membranes or skin necrosis: No Has patient had a PCN reaction that required hospitalization: No Has patient had a PCN reaction occurring within the last 10 years: Yes If all of the above answers are "NO", then may proceed with Cephalosporin use.    Metformin And Related Nausea And Vomiting   Ozempic (0.25 Or 0.5 Mg-Dose) [Semaglutide(0.25 Or 0.5mg -Dos)]    Trulicity [Dulaglutide] Nausea And Vomiting    Level of Care/Admitting Diagnosis ED Disposition     ED Disposition  Admit   Condition  --   Comment  Hospital Area: [facility] [[id number]]  Level of Care: ICU [6]  May admit patient to [facility] or [facility] if equivalent level of care is available:: No  Covid Evaluation: Asymptomatic - no recent exposure (last 10 days) testing not required  Diagnosis: Peritonsillar abscess [475.ICD-9-CM]   Admitting Physician: Steffanie Dunn [[id number]]  Attending Physician: Steffanie Dunn [[id number]]  Certification:: I certify this patient will need inpatient services for at least 2 midnights  Expected Medical Readiness: 12/23/2022          B Medical/Surgery History Past Medical History:  Diagnosis Date   Abdominal wall pain    Anemia    Anxiety    Blood dyscrasia    lupus anticoagulant during pregnancy   Complication of anesthesia    pt states became " panicky" after left shoulder surgery 10/2019   Depression    takes cymbalta    Diabetes mellitus without complication (HCC)    Headache(784.0)    migraines   Hx of lupus anticoagulant disorder    Hyperlipidemia    Hypertension    Obesity    PONV (postoperative nausea and vomiting)    Sleep apnea    USES C-PAP   Past Surgical History:  Procedure Laterality Date   ABDOMINAL HYSTERECTOMY  2016   ANKLE ARTHROSCOPY WITH REPAIR SUBLUXING TENDON Left    BIOPSY  11/22/2021   Procedure: BIOPSY;  Surgeon: Imogene Burn, MD;  Location: [facility];  Service: Gastroenterology;;   CESAREAN SECTION WITH BILATERAL TUBAL LIGATION Bilateral 02/11/2013   Procedure: Repeat CESAREAN SECTION WITH BILATERAL TUBAL LIGATION;  Surgeon: Lenoard Aden, MD;  Location: [facility];  Service: Obstetrics;  Laterality: Bilateral;  EDD: 03/01/13   COLONOSCOPY WITH PROPOFOL N/A 11/22/2021   Procedure: COLONOSCOPY WITH PROPOFOL;  Surgeon: Imogene Burn, MD;  Location: [facility];  Service: Gastroenterology;  Laterality: N/A;  DEBRIDEMENT OF ABDOMINAL WALL ABSCESS N/A 12/03/2014   Procedure: INCISION OF ABDOMINAL WALL LIPOMA;  Surgeon: Gaynelle Adu, MD;  Location: [facility];  Service: General;  Laterality: N/A;   DILATION AND CURETTAGE OF UTERUS  2004   DILITATION & CURRETTAGE/HYSTROSCOPY WITH NOVASURE ABLATION N/A 11/19/2014   Procedure: DILATATION & CURETTAGE/HYSTEROSCOPY WITH NOVASURE ABLATION;  Surgeon: Olivia Mackie, MD;  Location: [facility];  Service:  Gynecology;  Laterality: N/A;   ENDOMETRIAL ABLATION  10/20/2014   ESOPHAGOGASTRODUODENOSCOPY (EGD) WITH PROPOFOL N/A 11/22/2021   Procedure: ESOPHAGOGASTRODUODENOSCOPY (EGD) WITH PROPOFOL;  Surgeon: Imogene Burn, MD;  Location: [facility];  Service: Gastroenterology;  Laterality: N/A;   FOOT SURGERY  2008   HERNIA REPAIR  09/19/2011   LAPAROSCOPY N/A 12/03/2014   Procedure: LAPAROSCOPY DIAGNOSTIC WITH LYSIS OF ADHESIONS;  Surgeon: Gaynelle Adu, MD;  Location: [facility];  Service: General;  Laterality: N/A;   left shoulder surgery      NASAL SEPTUM SURGERY     POLYPECTOMY  11/22/2021   Procedure: POLYPECTOMY;  Surgeon: Imogene Burn, MD;  Location: [facility];  Service: Gastroenterology;;  EGD and COLON   TUBAL LIGATION     VENTRAL HERNIA REPAIR  09/20/2010   Laparoscopic, Dr Gaynelle Adu   WISDOM TOOTH EXTRACTION       A IV Location/Drains/Wounds Patient Lines/Drains/Airways Status     Active Line/Drains/Airways     Name Placement date Placement time Site Days   Peripheral IV 12/14/22 20 G Left;Posterior Hand 12/14/22  --  Hand  1   Incision - 2 Ports Abdomen Left;Upper Left;Lower 12/03/14  0958  -- 2934            Intake/Output Last 24 hours  Intake/Output Summary (Last 24 hours) at 12/15/2022 0716 Last data filed at 12/15/2022 0556 Gross per 24 hour  Intake 49.19 ml  Output --  Net 49.19 ml    Labs/Imaging Results for orders placed or performed during the hospital encounter of 12/14/22 (from the past 48 hour(s))  Basic metabolic panel     Status: Abnormal   Collection Time: 12/15/22  2:08 AM  Result Value Ref Range   Sodium 136 135 - 145 mmol/L   Potassium 3.9 3.5 - 5.1 mmol/L   Chloride 104 98 - 111 mmol/L   CO2 22 22 - 32 mmol/L   Glucose, Bld 219 (H) 70 - 99 mg/dL    Comment: Glucose reference range applies only to samples taken after fasting for at least 8 hours.   BUN 12 6 - 20 mg/dL   Creatinine, Ser 1.61 0.44 - 1.00 mg/dL   Calcium 8.6 (L) 8.9 - 10.3  mg/dL   GFR, Estimated >09 >60 mL/min    Comment: (NOTE) Calculated using the CKD-EPI Creatinine Equation (2021)    Anion gap 10 5 - 15    Comment: Performed at [facility], [address], [place], [place] [address]  CBC with Differential     Status: Abnormal   Collection Time: 12/15/22  2:08 AM  Result Value Ref Range   WBC 15.2 (H) 4.0 - 10.5 K/uL   RBC 5.31 (H) 3.87 - 5.11 MIL/uL   Hemoglobin 14.1 12.0 - 15.0 g/dL   HCT 81.1 91.4 - 78.2 %   MCV 83.6 80.0 - 100.0 fL   MCH 26.6 26.0 - 34.0 pg   MCHC 31.8 30.0 - 36.0 g/dL   RDW 95.6 21.3 - 08.6 %   Platelets 243 150 - 400 K/uL   nRBC 0.0  0.0 - 0.2 %   Neutrophils Relative % 93 %   Neutro Abs 14.0 (H) 1.7 - 7.7 K/uL   Lymphocytes Relative 4 %   Lymphs Abs 0.6 (L) 0.7 - 4.0 K/uL   Monocytes Relative 2 %   Monocytes Absolute 0.4 0.1 - 1.0 K/uL   Eosinophils Relative 0 %   Eosinophils Absolute 0.0 0.0 - 0.5 K/uL   Basophils Relative 0 %   Basophils Absolute 0.1 0.0 - 0.1 K/uL   Immature Granulocytes 1 %   Abs Immature Granulocytes 0.14 (H) 0.00 - 0.07 K/uL    Comment: Performed at [facility], [address], [place], [place] [address]   CT Soft Tissue Neck W Contrast  Result Date: 12/15/2022 CLINICAL DATA:  Initial evaluation for acute dental pain, recent tooth extraction. EXAM: CT NECK WITH CONTRAST TECHNIQUE: Multidetector CT imaging of the neck was performed using the standard protocol following the bolus administration of intravenous contrast. RADIATION DOSE REDUCTION: This exam was performed according to the departmental dose-optimization program which includes automated exposure control, adjustment of the mA and/or kV according to patient size and/or use of iterative reconstruction technique. CONTRAST:  75mL OMNIPAQUE IOHEXOL 350 MG/ML SOLN COMPARISON:  Prior CT from 12/11/2022. FINDINGS: Pharynx and larynx: Sequelae of recent mandibular molar tooth extraction on the left. There has been interval worsening in  regional soft tissue swelling with inflammatory changes about the left mandible, with involvement of the left masticator, parapharyngeal, and submandibular spaces. Changes have progressed and worsened from prior. Phlegmonous soft tissue density adjacent to the left mandible and left submandibular gland without discrete abscess. Additionally, now seen is mucosal edema and swelling involving the left pharynx. Uvula is swollen. Epiglottis is edematous and swollen. Changes compatible with concomitant supraglottitis. Mass effect on the supraglottic airway which is narrowed, measuring 5 mm in transverse diameter at its most narrow point (series 3, image 51). Scattered stranding within the retropharyngeal space without loculated collection. Glottis is fairly symmetric and within normal limits inferiorly. Subglottic airway patent clear. Salivary glands: Parotid and right submandibular glands remain within normal limits. Inflammatory changes involve and surround the left submandibular gland related to the adjacent inflammatory process within the left face. Thyroid: Normal. Lymph nodes: Few prominent left upper cervical lymph nodes are seen, largest of which measures 1.3 cm in short axis at level 2. Findings are presumably reactive. Vascular: Normal intravascular enhancement seen within the neck. Limited intracranial: Unremarkable. Visualized orbits: Not included on this exam. Mastoids and visualized paranasal sinuses: Visualized paranasal sinuses are clear. Mastoid air cells and middle ear cavities are well pneumatized and free of fluid. Skeleton: No discrete or worrisome osseous lesions. No significant spondylosis within the cervical spine for age. Upper chest: No acute finding. Other: None. IMPRESSION: 1. Sequelae of recent mandibular molar tooth extraction on the left. Interval worsening in regional soft tissue swelling with inflammatory changes, concerning for worsened infection/cellulitis. Phlegmonous soft tissue  density adjacent to the left mandible and left submandibular gland without discrete abscess or drainable fluid collection. 2. New mucosal edema and swelling about the left pharynx and supraglottic larynx with involvement of the epiglottis. Findings consistent with concomitant supraglottitis. Close clinical monitoring recommended as this patient could potentially be a risk for developing airway compromise. 3. Mildly enlarged left upper cervical lymph nodes, presumably reactive. Electronically Signed   By: Rise Mu M.D.   On: 12/15/2022 04:10    Pending Labs [employer] (From admission, onward)     Start  Ordered   12/16/22 0500  CBC  Tomorrow morning,   R        12/15/22 0641   12/16/22 0500  Basic metabolic panel  Tomorrow morning,   R        12/15/22 0641   12/16/22 0500  Magnesium  Tomorrow morning,   R        12/15/22 0641   12/16/22 0500  Phosphorus  Tomorrow morning,   R        12/15/22 0641   12/15/22 0642  Hemoglobin A1c  (Glycemic Control (SSI)  Q 4 Hours / Glycemic Control (SSI)  AC +/- HS)  Once,   R       Comments: To assess prior glycemic control    12/15/22 0641   12/15/22 0638  HIV Antibody (routine testing w rflx)  (HIV Antibody (Routine testing w reflex) panel)  Once,   R        12/15/22 0641   12/15/22 6387  CBC  (heparin)  Once,   R       Comments: Baseline for heparin therapy IF NOT ALREADY DRAWN.  Notify MD if PLT < 100 K.    12/15/22 0641   12/15/22 0638  Creatinine, serum  (heparin)  Once,   R       Comments: Baseline for heparin therapy IF NOT ALREADY DRAWN.    12/15/22 0641   12/15/22 0428  Blood culture (routine x 2)  BLOOD CULTURE X 2,   R (with STAT occurrences)      12/15/22 0427            Vitals/Pain Today's Vitals   12/15/22 0522 12/15/22 0555 12/15/22 0700 12/15/22 0715  BP:  138/71 132/70 134/73  Pulse:  89 82 81  Resp:  (!) 22 (!) 28 14  Temp:      TempSrc:      SpO2:  92% 97% 95%  Weight:      Height:      PainSc:  0-No pain       Isolation Precautions No active isolations  Medications Medications  docusate sodium (COLACE) capsule 100 mg (has no administration in time range)  polyethylene glycol (MIRALAX / GLYCOLAX) packet 17 g (has no administration in time range)  heparin injection 5,000 Units (has no administration in time range)  lactated ringers infusion (has no administration in time range)  ondansetron (ZOFRAN) injection 4 mg (has no administration in time range)  insulin aspart (novoLOG) injection 0-20 Units (has no administration in time range)  dexamethasone (DECADRON) injection 8 mg (has no administration in time range)  rosuvastatin (CRESTOR) tablet 20 mg (has no administration in time range)  vancomycin (VANCOREADY) IVPB 1500 mg/300 mL (has no administration in time range)  piperacillin-tazobactam (ZOSYN) IVPB 3.375 g (has no administration in time range)  dexamethasone (DECADRON) injection 8 mg (8 mg Intravenous Given 12/15/22 0146)  HYDROmorphone (DILAUDID) injection 0.5 mg (0.5 mg Intravenous Given 12/15/22 0144)  ondansetron (ZOFRAN) injection 4 mg (4 mg Intravenous Given 12/15/22 0144)  iohexol (OMNIPAQUE) 350 MG/ML injection 75 mL (75 mLs Intravenous Contrast Given 12/15/22 0330)  sodium chloride 0.9 % bolus 1,000 mL (1,000 mLs Intravenous New Bag/Given 12/15/22 0521)  HYDROmorphone (DILAUDID) injection 1 mg (1 mg Intravenous Given 12/15/22 0452)  piperacillin-tazobactam (ZOSYN) IVPB 3.375 g (0 g Intravenous Stopped 12/15/22 0556)    Mobility walks     Focused Assessments Cardiac Assessment Handoff:    Lab Results  Component Value Date   CKTOTAL  31 (L) 07/29/2020   CKMB 1.6 11/17/2010   TROPONINI <0.30 11/17/2010   Lab Results  Component Value Date   DDIMER <0.27 03/14/2020   Does the Patient currently have chest pain? No    R Recommendations: See Admitting Provider Note  Report given to:   Additional Notes:

## 2022-12-15 NOTE — Consult Note (Signed)
Reason for Consult: Dental/masticator space infection, post tooth extraction Referring Physician: Lynnell Catalan, MD  Kathleen Walsh is an 42 y.o. female.  HPI: She had a left lower molar extracted about 2 weeks ago.  She started having swelling afterwards and went back to her dentist.  She was told the extraction site looked fine.  Swelling got worse and she ended up being put on clindamycin a week ago.  She went to the [facility] ER and had a CT on the 11th which revealed inflammation but no abscess.  She got worse and ended up in the emergency department here last night.  She drinks about 60 ounces of tea daily.  Past Medical History:  Diagnosis Date   Abdominal wall pain    Anemia    Anxiety    Blood dyscrasia    lupus anticoagulant during pregnancy   Complication of anesthesia    pt states became " panicky" after left shoulder surgery 10/2019   Depression    takes cymbalta    Diabetes mellitus without complication (HCC)    Headache(784.0)    migraines   Hx of lupus anticoagulant disorder    Hyperlipidemia    Hypertension    Obesity    PONV (postoperative nausea and vomiting)    Sleep apnea    USES C-PAP    Past Surgical History:  Procedure Laterality Date   ABDOMINAL HYSTERECTOMY  2016   ANKLE ARTHROSCOPY WITH REPAIR SUBLUXING TENDON Left    BIOPSY  11/22/2021   Procedure: BIOPSY;  Surgeon: Imogene Burn, MD;  Location: [facility];  Service: Gastroenterology;;   CESAREAN SECTION WITH BILATERAL TUBAL LIGATION Bilateral 02/11/2013   Procedure: Repeat CESAREAN SECTION WITH BILATERAL TUBAL LIGATION;  Surgeon: Lenoard Aden, MD;  Location: [facility];  Service: Obstetrics;  Laterality: Bilateral;  EDD: 03/01/13   COLONOSCOPY WITH PROPOFOL N/A 11/22/2021   Procedure: COLONOSCOPY WITH PROPOFOL;  Surgeon: Imogene Burn, MD;  Location: [facility];  Service: Gastroenterology;  Laterality: N/A;   DEBRIDEMENT OF ABDOMINAL WALL ABSCESS N/A 12/03/2014   Procedure: INCISION OF ABDOMINAL  WALL LIPOMA;  Surgeon: Gaynelle Adu, MD;  Location: [facility];  Service: General;  Laterality: N/A;   DILATION AND CURETTAGE OF UTERUS  2004   DILITATION & CURRETTAGE/HYSTROSCOPY WITH NOVASURE ABLATION N/A 11/19/2014   Procedure: DILATATION & CURETTAGE/HYSTEROSCOPY WITH NOVASURE ABLATION;  Surgeon: Olivia Mackie, MD;  Location: [facility];  Service: Gynecology;  Laterality: N/A;   ENDOMETRIAL ABLATION  10/20/2014   ESOPHAGOGASTRODUODENOSCOPY (EGD) WITH PROPOFOL N/A 11/22/2021   Procedure: ESOPHAGOGASTRODUODENOSCOPY (EGD) WITH PROPOFOL;  Surgeon: Imogene Burn, MD;  Location: [facility];  Service: Gastroenterology;  Laterality: N/A;   FOOT SURGERY  2008   HERNIA REPAIR  09/19/2011   LAPAROSCOPY N/A 12/03/2014   Procedure: LAPAROSCOPY DIAGNOSTIC WITH LYSIS OF ADHESIONS;  Surgeon: Gaynelle Adu, MD;  Location: [facility];  Service: General;  Laterality: N/A;   left shoulder surgery      NASAL SEPTUM SURGERY     POLYPECTOMY  11/22/2021   Procedure: POLYPECTOMY;  Surgeon: Imogene Burn, MD;  Location: [facility];  Service: Gastroenterology;;  EGD and COLON   TUBAL LIGATION     VENTRAL HERNIA REPAIR  09/20/2010   Laparoscopic, Dr Gaynelle Adu   WISDOM TOOTH EXTRACTION      Family History  Problem Relation Age of Onset   Diabetes Mother    Hypertension Mother    Heart disease Mother    Diabetes Father    Hypertension Father  Colon polyps Father    Colitis Father    Irritable bowel syndrome Father    Breast cancer Maternal Grandmother    Crohn's disease Maternal Grandmother    Prostate cancer Maternal Grandfather    Clotting disorder Paternal Grandmother    Leukemia Paternal Grandfather    Irritable bowel syndrome Daughter    Stomach cancer Neg Hx    Esophageal cancer Neg Hx     Social History:  reports that she has quit smoking. Her smoking use included cigarettes. She started smoking about 5 years ago. She has a 17.3 pack-year smoking history. She has never used smokeless tobacco. She  reports that she does not drink alcohol and does not use drugs.  Allergies:  Allergies  Allergen Reactions   Amoxicillin Nausea And Vomiting    Has patient had a PCN reaction causing immediate rash, facial/tongue/throat swelling, SOB or lightheadedness with hypotension: No Has patient had a PCN reaction causing severe rash involving mucus membranes or skin necrosis: No Has patient had a PCN reaction that required hospitalization: No Has patient had a PCN reaction occurring within the last 10 years: Yes If all of the above answers are "NO", then may proceed with Cephalosporin use.    Metformin And Related Nausea And Vomiting   Ozempic (0.25 Or 0.5 Mg-Dose) [Semaglutide(0.25 Or 0.5mg -Dos)]    Trulicity [Dulaglutide] Nausea And Vomiting    Medications: Reviewed  Results for orders placed or performed during the hospital encounter of 12/14/22 (from the past 48 hour(s))  Basic metabolic panel     Status: Abnormal   Collection Time: 12/15/22  2:08 AM  Result Value Ref Range   Sodium 136 135 - 145 mmol/L   Potassium 3.9 3.5 - 5.1 mmol/L   Chloride 104 98 - 111 mmol/L   CO2 22 22 - 32 mmol/L   Glucose, Bld 219 (H) 70 - 99 mg/dL    Comment: Glucose reference range applies only to samples taken after fasting for at least 8 hours.   BUN 12 6 - 20 mg/dL   Creatinine, Ser 8.41 0.44 - 1.00 mg/dL   Calcium 8.6 (L) 8.9 - 10.3 mg/dL   GFR, Estimated >32 >44 mL/min    Comment: (NOTE) Calculated using the CKD-EPI Creatinine Equation (2021)    Anion gap 10 5 - 15    Comment: Performed at [facility], [address] [place], [place] [address]  CBC with Differential     Status: Abnormal   Collection Time: 12/15/22  2:08 AM  Result Value Ref Range   WBC 15.2 (H) 4.0 - 10.5 K/uL   RBC 5.31 (H) 3.87 - 5.11 MIL/uL   Hemoglobin 14.1 12.0 - 15.0 g/dL   HCT 25.3 66.4 - 40.3 %   MCV 83.6 80.0 - 100.0 fL   MCH 26.6 26.0 - 34.0 pg   MCHC 31.8 30.0 - 36.0 g/dL   RDW 47.4 25.9 - 56.3 %    Platelets 243 150 - 400 K/uL   nRBC 0.0 0.0 - 0.2 %   Neutrophils Relative % 93 %   Neutro Abs 14.0 (H) 1.7 - 7.7 K/uL   Lymphocytes Relative 4 %   Lymphs Abs 0.6 (L) 0.7 - 4.0 K/uL   Monocytes Relative 2 %   Monocytes Absolute 0.4 0.1 - 1.0 K/uL   Eosinophils Relative 0 %   Eosinophils Absolute 0.0 0.0 - 0.5 K/uL   Basophils Relative 0 %   Basophils Absolute 0.1 0.0 - 0.1 K/uL   Immature  Granulocytes 1 %   Abs Immature Granulocytes 0.14 (H) 0.00 - 0.07 K/uL    Comment: Performed at [facility], [address], [place], [place] [address]  Glucose, capillary     Status: Abnormal   Collection Time: 12/15/22  8:12 AM  Result Value Ref Range   Glucose-Capillary 270 (H) 70 - 99 mg/dL    Comment: Glucose reference range applies only to samples taken after fasting for at least 8 hours.    CT Soft Tissue Neck W Contrast  Result Date: 12/15/2022 CLINICAL DATA:  Initial evaluation for acute dental pain, recent tooth extraction. EXAM: CT NECK WITH CONTRAST TECHNIQUE: Multidetector CT imaging of the neck was performed using the standard protocol following the bolus administration of intravenous contrast. RADIATION DOSE REDUCTION: This exam was performed according to the departmental dose-optimization program which includes automated exposure control, adjustment of the mA and/or kV according to patient size and/or use of iterative reconstruction technique. CONTRAST:  75mL OMNIPAQUE IOHEXOL 350 MG/ML SOLN COMPARISON:  Prior CT from 12/11/2022. FINDINGS: Pharynx and larynx: Sequelae of recent mandibular molar tooth extraction on the left. There has been interval worsening in regional soft tissue swelling with inflammatory changes about the left mandible, with involvement of the left masticator, parapharyngeal, and submandibular spaces. Changes have progressed and worsened from prior. Phlegmonous soft tissue density adjacent to the left mandible and left submandibular gland without discrete abscess.  Additionally, now seen is mucosal edema and swelling involving the left pharynx. Uvula is swollen. Epiglottis is edematous and swollen. Changes compatible with concomitant supraglottitis. Mass effect on the supraglottic airway which is narrowed, measuring 5 mm in transverse diameter at its most narrow point (series 3, image 51). Scattered stranding within the retropharyngeal space without loculated collection. Glottis is fairly symmetric and within normal limits inferiorly. Subglottic airway patent clear. Salivary glands: Parotid and right submandibular glands remain within normal limits. Inflammatory changes involve and surround the left submandibular gland related to the adjacent inflammatory process within the left face. Thyroid: Normal. Lymph nodes: Few prominent left upper cervical lymph nodes are seen, largest of which measures 1.3 cm in short axis at level 2. Findings are presumably reactive. Vascular: Normal intravascular enhancement seen within the neck. Limited intracranial: Unremarkable. Visualized orbits: Not included on this exam. Mastoids and visualized paranasal sinuses: Visualized paranasal sinuses are clear. Mastoid air cells and middle ear cavities are well pneumatized and free of fluid. Skeleton: No discrete or worrisome osseous lesions. No significant spondylosis within the cervical spine for age. Upper chest: No acute finding. Other: None. IMPRESSION: 1. Sequelae of recent mandibular molar tooth extraction on the left. Interval worsening in regional soft tissue swelling with inflammatory changes, concerning for worsened infection/cellulitis. Phlegmonous soft tissue density adjacent to the left mandible and left submandibular gland without discrete abscess or drainable fluid collection. 2. New mucosal edema and swelling about the left pharynx and supraglottic larynx with involvement of the epiglottis. Findings consistent with concomitant supraglottitis. Close clinical monitoring recommended as  this patient could potentially be a risk for developing airway compromise. 3. Mildly enlarged left upper cervical lymph nodes, presumably reactive. Electronically Signed   By: Rise Mu M.D.   On: 12/15/2022 04:10    YNW:GNFAOZHY except as listed in admit H&P  Blood pressure 134/73, pulse 81, temperature 98.2 F (36.8 C), temperature source Oral, resp. rate 14, height 5\' 6"  (1.676 m), weight (!) 149 kg, last menstrual period 08/31/2015, SpO2 95%.  PHYSICAL EXAM: Overall appearance: Morbidly obese lady, in  no distress, but in discomfort.  There is no stridor. Head:  Normocephalic, atraumatic. Ears: External ears look healthy. Nose: External nose is healthy in appearance. Internal nasal exam free of any lesions or obstruction. Oral Cavity/Pharynx:  There are no mucosal lesions or masses identified.  Extraction site is healing nicely.  There is no trismus.  Left tonsil is full and slightly tender. Larynx/Hypopharynx: See below Neuro:  No identifiable neurologic deficits. Neck: No palpable neck masses.  There is a little fullness along the perimandibular soft tissue on the left and she is very tender in that area.  Studies Reviewed: CT from earlier this morning and from the 11th were both reviewed.  There is evidence of a possible early left masticator space abscess forming.     Procedures: Flexible fiberoptic laryngoscopy:  Topical Afrin/Xylocaine spray was applied to the nasal cavities.  The scope was passed down the left nasal cavity.  The nasal cavity is healthy and clear.  The nasopharynx is full but there is no infection or inflammation.  There is fullness of the left tonsil.  The left pharyngeal wall and left arytenoid are edematous but there is no airway obstruction.  The cords move well and are easily visualized.   Assessment/Plan: Post dental extraction infection with possible early perimandibular abscess.  This should be evaluated by oral surgery as it may require  incision and drainage.  Airway is stable.  Excessive caffeine intake, and morbid obesity put her at significant risk for acid reflux which can exacerbate the supraglottic swelling.  Recommend aggressive antireflux treatment.  Call us again if there are any additional airway concerns.   E66.01 J38.4 K12.2  Medical Decision Making: #/Complex Problems: 4  Data Reviewed:4  Management:4 (1-Straightforward, 2-Low, 3-Moderate, 4-High)   Serena Colonel 12/15/2022, 8:54 AM

## 2022-12-15 NOTE — Progress Notes (Signed)
ED Pharmacy Antibiotic Sign Off An antibiotic consult was received from an ED provider for Zosyn per pharmacy dosing for oral infection. A chart review was completed to assess appropriateness.   The following one time order(s) were placed:  Zosyn 3.375g IV x 1  Further antibiotic and/or antibiotic pharmacy consults should be ordered by the admitting provider if indicated.   Thank you for allowing pharmacy to be a part of this patient's care.   Abran Duke, PharmD, BCPS Clinical Pharmacist Phone: [phone]

## 2022-12-15 NOTE — Progress Notes (Signed)
Pharmacy Antibiotic Note  Kathleen Walsh is a 42 y.o. female admitted on 12/14/2022 with  oral infection/cellulitis .  Pharmacy has been consulted for Vancomycin/Zosyn dosing. Infection appears worsened on CT despite oral Clindamycin. WBC elevated. Renal function good.   Plan: Vancomycin 1250mg  Q12H (eAUC 444, Scr 0.8, Vd 0.5) - reduced to prevent accumulation Zosyn 3.375G IV q8h to be infused over 4 hours   Height: 5\' 6"  (167.6 cm) Weight: (!) 149 kg (328 lb 7.8 oz) IBW/kg (Calculated) : 59.3  Temp (24hrs), Avg:97.9 F (36.6 C), Min:97.6 F (36.4 C), Max:98.2 F (36.8 C)  Recent Labs  Lab 12/11/22 0458 12/15/22 0208 12/15/22 0854  WBC 11.8* 15.2* 19.0*  CREATININE 0.54 0.70  --     Estimated Creatinine Clearance: 137.7 mL/min (by C-G formula based on SCr of 0.7 mg/dL).    Allergies  Allergen Reactions   Amoxicillin Nausea And Vomiting    Has patient had a PCN reaction causing immediate rash, facial/tongue/throat swelling, SOB or lightheadedness with hypotension: No Has patient had a PCN reaction causing severe rash involving mucus membranes or skin necrosis: No Has patient had a PCN reaction that required hospitalization: No Has patient had a PCN reaction occurring within the last 10 years: Yes If all of the above answers are "NO", then may proceed with Cephalosporin use.    Metformin And Related Nausea And Vomiting   Ozempic (0.25 Or 0.5 Mg-Dose) [Semaglutide(0.25 Or 0.5mg -Dos)]    Trulicity [Dulaglutide] Nausea And Vomiting    Eldridge Scot, PharmD Clinical Pharmacist 12/15/2022, 10:08 AM

## 2022-12-16 DIAGNOSIS — J36 Peritonsillar abscess: Secondary | ICD-10-CM | POA: Diagnosis not present

## 2022-12-16 LAB — BASIC METABOLIC PANEL
Anion gap: 9 (ref 5–15)
BUN: 14 mg/dL (ref 6–20)
CO2: 20 mmol/L — ABNORMAL LOW (ref 22–32)
Calcium: 8.4 mg/dL — ABNORMAL LOW (ref 8.9–10.3)
Chloride: 104 mmol/L (ref 98–111)
Creatinine, Ser: 0.57 mg/dL (ref 0.44–1.00)
GFR, Estimated: >60 mL/min (ref >60)
Glucose, Bld: 274 mg/dL — ABNORMAL HIGH (ref 70–99)
Potassium: 4.5 mmol/L (ref 3.5–5.1)
Sodium: 133 mmol/L — ABNORMAL LOW (ref 135–145)

## 2022-12-16 LAB — GLUCOSE, CAPILLARY
Glucose-Capillary: 250 mg/dL — ABNORMAL HIGH (ref 70–99)
Glucose-Capillary: 271 mg/dL — ABNORMAL HIGH (ref 70–99)
Glucose-Capillary: 211 mg/dL — ABNORMAL HIGH (ref 70–99)
Glucose-Capillary: 206 mg/dL — ABNORMAL HIGH (ref 70–99)
Glucose-Capillary: 221 mg/dL — ABNORMAL HIGH (ref 70–99)

## 2022-12-16 LAB — MAGNESIUM: Magnesium: 2.1 mg/dL (ref 1.7–2.4)

## 2022-12-16 LAB — CBC
HCT: 37.1 % (ref 36.0–46.0)
Hemoglobin: 11.7 g/dL — ABNORMAL LOW (ref 12.0–15.0)
MCH: 25.4 pg — ABNORMAL LOW (ref 26.0–34.0)
MCHC: 31.5 g/dL (ref 30.0–36.0)
MCV: 80.5 fL (ref 80.0–100.0)
Platelets: 223 10*3/uL (ref 150–400)
RBC: 4.61 MIL/uL (ref 3.87–5.11)
RDW: 14.6 % (ref 11.5–15.5)
WBC: 15.2 10*3/uL — ABNORMAL HIGH (ref 4.0–10.5)
nRBC: 0 % (ref 0.0–0.2)

## 2022-12-16 LAB — PHOSPHORUS: Phosphorus: 3.1 mg/dL (ref 2.5–4.6)

## 2022-12-16 MED ORDER — SODIUM CHLORIDE 0.9 % IV SOLN
3.0000 g | Freq: Four times a day (QID) | INTRAVENOUS | Status: DC
  Administered 2022-12-16 – 2022-12-21 (×20): 3 g via INTRAVENOUS
  Filled 2022-12-16 (×21): qty 8

## 2022-12-16 MED ORDER — IBUPROFEN 100 MG/5ML PO SUSP
400.0000 mg | Freq: Four times a day (QID) | ORAL | Status: DC | PRN
  Administered 2022-12-16 – 2022-12-19 (×2): 400 mg via ORAL
  Filled 2022-12-16 (×4): qty 20

## 2022-12-16 MED ORDER — CIPROFLOXACIN-DEXAMETHASONE 0.3-0.1 % OT SUSP
4.0000 [drp] | Freq: Two times a day (BID) | OTIC | Status: AC
  Administered 2022-12-16 – 2022-12-20 (×10): 4 [drp] via OTIC
  Filled 2022-12-16: qty 7.5

## 2022-12-16 MED ORDER — ACETAMINOPHEN 325 MG PO TABS
650.0000 mg | ORAL_TABLET | Freq: Four times a day (QID) | ORAL | Status: DC | PRN
  Administered 2022-12-16: 650 mg via ORAL
  Filled 2022-12-16 (×2): qty 2

## 2022-12-16 MED ORDER — TRAMADOL HCL 50 MG PO TABS
50.0000 mg | ORAL_TABLET | Freq: Four times a day (QID) | ORAL | Status: DC | PRN
  Administered 2022-12-16: 50 mg via ORAL
  Filled 2022-12-16: qty 1

## 2022-12-16 MED ORDER — MAGIC MOUTHWASH W/LIDOCAINE
15.0000 mL | Freq: Three times a day (TID) | ORAL | Status: DC | PRN
  Administered 2022-12-16 – 2022-12-20 (×2): 15 mL via ORAL
  Filled 2022-12-16 (×4): qty 15

## 2022-12-16 MED ORDER — INSULIN ASPART 100 UNIT/ML IJ SOLN
0.0000 [IU] | Freq: Three times a day (TID) | INTRAMUSCULAR | Status: DC
  Administered 2022-12-16 (×2): 7 [IU] via SUBCUTANEOUS
  Administered 2022-12-17: 4 [IU] via SUBCUTANEOUS
  Administered 2022-12-17 – 2022-12-18 (×5): 7 [IU] via SUBCUTANEOUS
  Administered 2022-12-19 – 2022-12-20 (×2): 4 [IU] via SUBCUTANEOUS
  Administered 2022-12-20 – 2022-12-21 (×2): 3 [IU] via SUBCUTANEOUS
  Administered 2022-12-21: 4 [IU] via SUBCUTANEOUS

## 2022-12-16 MED ORDER — TRAMADOL 5 MG/ML ORAL SUSPENSION: 50.0000 mg | Freq: Four times a day (QID) | ORAL | Status: DC | PRN

## 2022-12-16 MED ORDER — ROSUVASTATIN CALCIUM 20 MG PO TABS
40.0000 mg | ORAL_TABLET | Freq: Every day | ORAL | Status: DC
  Administered 2022-12-16 – 2022-12-21 (×4): 40 mg via ORAL
  Filled 2022-12-16 (×4): qty 2

## 2022-12-16 MED ORDER — OXYCODONE HCL 20 MG/ML PO CONC
10.0000 mg | ORAL | Status: DC | PRN
  Administered 2022-12-16 (×2): 10 mg via ORAL
  Filled 2022-12-16 (×2): qty 0.5

## 2022-12-16 MED ORDER — INSULIN ASPART 100 UNIT/ML IJ SOLN
0.0000 [IU] | Freq: Every day | INTRAMUSCULAR | Status: DC
  Administered 2022-12-16: 2 [IU] via SUBCUTANEOUS
  Administered 2022-12-20: 3 [IU] via SUBCUTANEOUS

## 2022-12-16 MED ORDER — FAMOTIDINE 20 MG PO TABS: 20.0000 mg | ORAL_TABLET | Freq: Two times a day (BID) | ORAL | Status: DC | PRN

## 2022-12-16 NOTE — Plan of Care (Signed)
Nutrition Education Note  RD consulted for nutrition education regarding diabetes.  Spoke with pt at bedside who reports that she is only able to open her mouth so wide due to pain and throat swelling from peritonsillar infection. Pt reports that she was, however, able to consume an entire [place] sandwich with applesauce for lunch as well as a few bites each of 2 different types of vegetables.  Pt reports speaking with Diabetes Coordinator earlier in the day. Pt states that she has made dietary modifications at home to get her hemoglobin A1C down from ~11 to 8.2.  Lab Results  Component Value Date   HGBA1C 8.2 (H) 12/15/2022    RD provided "The Plate Method for Diabetes" handout from the Academy of Nutrition and Dietetics. Discussed different food groups and their effects on blood sugar, emphasizing carbohydrate-containing foods. Provided list of carbohydrates and recommended serving sizes of common foods.  Discussed importance of controlled and consistent carbohydrate intake throughout the day. Provided examples of ways to balance meals/snacks and encouraged intake of high-fiber, whole grain complex carbohydrates. Teach back method used.  Expect good compliance.  Current diet order is Carb Modified, patient is consuming approximately 100% of meals at this time. Labs and medications reviewed. No further nutrition interventions warranted at this time. RD contact information provided. If additional nutrition issues arise, please re-consult RD.   Mertie Clause, MS, RD, LDN Registered Dietitian II Please see AMiON for contact information.

## 2022-12-16 NOTE — Discharge Instructions (Signed)
Please call local Endocrinology offices to see if they accept your insurance. The following are some of the local Endocrinologist in [place]: [facility] [phone] [facility] [phone]

## 2022-12-16 NOTE — Plan of Care (Signed)
Continue plan of care.

## 2022-12-16 NOTE — TOC CM/SW Note (Signed)
Transition of Care [facility]) - Inpatient Brief Assessment   Patient Details  Name: CLARINE SNITKER MRN: [id number] Date of Birth: [birth year]  Transition of Care [facility]) CM/SW Contact:    Tom-Johnson, Hershal Coria, RN Phone Number: 12/16/2022, 1:43 PM   Clinical Narrative:  Patient presented to the ED with worsening swelling of Lt neck/throat. Patient had recent tooth extraction last Friday 12/09/22. Admitted with Peritonsillar Abscess  and in ICU for potential Airway compromise. On IV abx. ENT following.  No TOC needs or recommendations noted at this time. Patient not Medically ready for discharge.  CM will continue to follow as patient progresses with care towards discharge.      Transition of Care Asessment: Insurance and Status: Insurance coverage has been reviewed Patient has primary care physician: Yes Home environment has been reviewed: Yes Prior level of function:: Independent   Social Determinants of Health Reivew: SDOH reviewed no interventions necessary Readmission risk has been reviewed: Yes Transition of care needs: no transition of care needs at this time

## 2022-12-16 NOTE — Progress Notes (Signed)
NAME:  Kathleen Walsh, MRN:  [id number], DOB:  [birth year], LOS: 1 ADMISSION DATE:  12/14/2022, CONSULTATION DATE:  8/15 REFERRING MD:  PA Faulkner-ED, CHIEF COMPLAINT:  airway edema   History of Present Illness:  Kathleen Walsh is a 42 y/o woman with a history of DM who presented with swelling in the throat and developing SOB on the left side after getting a tooth pulled 6 days prior to presentation. She has had teeth pulled previously uneventfully, but this time began getting fullness in her throat and below her jaw on the left side. She is not on immunosuppressive medications. She has a history of lupus antiphospholipid antibody syndrome but is not on [facility]; she has only had clots and required AC during pregnancies. She has improved since coming to the ED- she does not feel that her throat is as swollen. She can swallow her secretions and has been able to talk. She has pain with swallowing. She does not feel SOB. ENT has been consulted. In the ED she received zosyn & dexamethasone.   Pertinent  Medical History  OSA HTN DM HLD History of lupus antiphospholipid antibody syndrome anemia  Significant Walsh Events: Including procedures, antibiotic start and stop dates in addition to other pertinent events   8/15 admitted, started on Vanc/Zosyn, received dexamethasone  8/16 switched to Unasyn, transferred to floor, TRH to assume care 8/17  Interim History / Subjective:  Reports feeling slightly better this morning, feels her swelling is gone down a little bit.  Still having a lot of pain with swallowing.  Has been able to tolerate small amount of liquids but does not feel very comfortable with solids yet.  Willing to try.  Breathing okay  Objective   Blood pressure (!) 142/73, pulse (!) 53, temperature 98.4 F (36.9 C), temperature source Axillary, resp. rate (!) 25, height 5\' 6"  (1.676 m), weight 129.3 kg, last menstrual period 08/31/2015, SpO2 92%.        Intake/Output Summary (Last 24 hours)  at 12/16/2022 1032 Last data filed at 12/16/2022 1000 Gross per 24 hour  Intake 3280.5 ml  Output --  Net 3280.5 ml   Filed Weights   12/14/22 2357 12/16/22 0500  Weight: (!) 149 kg 129.3 kg    Examination: General: middle aged woman lying in bed in NAD HENT: Unable to fully open mouth. Some fullness from the left peritonsilar region, tender to palpation. Induration and fullness below the left mandible.  Lungs: CTAB normal WOB on RA, no stridor or wheezing Cardiovascular: RRR no mrg Abdomen: obese, soft, NT Extremities: no cyanosis or edema Neuro: awake, alert, Moving all extremities Derm: warm, dry.  Some red discoloration below left mandible.    Ancillary tests personally reviewed:   WBC 15.2 (19) BUN 14 Cr 0.57 CT 8/15 > airway narrowing from the left to varying degrees in the pharynx. Cervical adenopathy on the left.  HBA1C: 8.2% Assessment & Plan:   Peritonsillar infection, possibly abscess after having tooth extracted Stable, improving, no evidence of airway compromise per Kathleen Walsh ENT who was consulted in the ED. May need oral surgery eval.  Starting to increase PO intake, continue IV abx today and will need to show that she can tolerate PO before switching to oral abx.  Stable for transfer out of ICU to MedSurg. -Switch to IV Unasyn, consider Augmentin when able to tolerate solids. -Transfer to floor.  TRH to assume care 8/17 -Stop IV fluids -PO Tramadol, Tylenol for pain control -s/p dexamethasone x 5 doses -  Tolerating liquid diet, advance to carb modified diet and monitor p.o.  Poorly controlled DM type 2 -high dose SSI -Semglee 20u BID -Diabetes coordinator consulted -goal BG 140-180, note that she completed a course of decadron 8/15 - Hold Farxiga, would be good GLP-1 candidate.   HTN Stable -hold PTA hydrochlorothiazide and lisinopril  Obesity -long term recommend weight loss  HLD -con't crestor  Best Practice (right click and "Reselect all SmartList  Selections" daily)   Diet/type: Regular consistency (see orders) DVT prophylaxis: prophylactic heparin  GI prophylaxis: N/A Lines: N/A Foley:  N/A Code Status:  full code Last date of multidisciplinary goals of care discussion [ Patient was updated at the bedside.]  Vonna Drafts, MD  12/16/2022, 10:32 AM

## 2022-12-16 NOTE — Plan of Care (Signed)
Problem: Education: Goal: Knowledge of General Education information will improve Description: Including pain rating scale, medication(s)/side effects and non-pharmacologic comfort measures 12/16/2022 1106 by Jearld Pies, RN Outcome: Progressing 12/16/2022 1105 by Jearld Pies, RN Outcome: Progressing 12/16/2022 1026 by Jearld Pies, RN Outcome: Progressing   Problem: Health Behavior/Discharge Planning: Goal: Ability to manage health-related needs will improve 12/16/2022 1106 by Jearld Pies, RN Outcome: Progressing 12/16/2022 1105 by Jearld Pies, RN Outcome: Progressing 12/16/2022 1026 by Jearld Pies, RN Outcome: Progressing   Problem: Clinical Measurements: Goal: Ability to maintain clinical measurements within normal limits will improve 12/16/2022 1106 by Jearld Pies, RN Outcome: Progressing 12/16/2022 1105 by Jearld Pies, RN Outcome: Progressing 12/16/2022 1026 by Jearld Pies, RN Outcome: Progressing Goal: Will remain free from infection 12/16/2022 1106 by Jearld Pies, RN Outcome: Progressing 12/16/2022 1026 by Jearld Pies, RN Outcome: Progressing Goal: Diagnostic test results will improve 12/16/2022 1106 by Jearld Pies, RN Outcome: Progressing 12/16/2022 1105 by Jearld Pies, RN Outcome: Progressing 12/16/2022 1026 by Jearld Pies, RN Outcome: Progressing Goal: Respiratory complications will improve 12/16/2022 1106 by Jearld Pies, RN Outcome: Progressing 12/16/2022 1105 by Jearld Pies, RN Outcome: Progressing 12/16/2022 1026 by Jearld Pies, RN Outcome: Progressing Goal: Cardiovascular complication will be avoided 12/16/2022 1106 by Jearld Pies, RN Outcome: Progressing 12/16/2022 1105 by Jearld Pies, RN Outcome: Progressing 12/16/2022 1026 by Jearld Pies, RN Outcome: Progressing   Problem: Activity: Goal: Risk for activity intolerance will decrease 12/16/2022 1106 by Jearld Pies, RN Outcome: Progressing 12/16/2022 1105 by Jearld Pies, RN Outcome: Progressing 12/16/2022 1026 by Jearld Pies, RN Outcome: Progressing   Problem: Nutrition: Goal: Adequate nutrition will be maintained 12/16/2022 1106 by Jearld Pies, RN Outcome: Progressing 12/16/2022 1105 by Jearld Pies, RN Outcome: Progressing 12/16/2022 1026 by Jearld Pies, RN Outcome: Progressing   Problem: Coping: Goal: Level of anxiety will decrease 12/16/2022 1106 by Jearld Pies, RN Outcome: Progressing 12/16/2022 1026 by Jearld Pies, RN Outcome: Progressing   Problem: Pain Managment: Goal: General experience of comfort will improve 12/16/2022 1106 by Jearld Pies, RN Outcome: Progressing 12/16/2022 1026 by Jearld Pies, RN Outcome: Progressing   Problem: Safety: Goal: Ability to remain free from injury will improve 12/16/2022 1106 by Jearld Pies, RN Outcome: Progressing 12/16/2022 1026 by Jearld Pies, RN Outcome: Progressing   Problem: Skin Integrity: Goal: Risk for impaired skin integrity will decrease 12/16/2022 1106 by Jearld Pies, RN Outcome: Progressing 12/16/2022 1026 by Jearld Pies, RN Outcome: Progressing   Problem: Education: Goal: Ability to describe self-care measures that may prevent or decrease complications (Diabetes Survival Skills Education) will improve 12/16/2022 1106 by Jearld Pies, RN Outcome: Progressing 12/16/2022 1026 by Jearld Pies, RN Outcome: Progressing Goal: Individualized Educational Video(s) Outcome: Progressing   Problem: Coping: Goal: Ability to adjust to condition or change in health will improve 12/16/2022 1106 by Jearld Pies, RN Outcome: Progressing 12/16/2022 1026 by Jearld Pies, RN Outcome: Progressing   Problem: Fluid Volume: Goal: Ability to maintain a balanced intake and output will improve Outcome: Progressing   Problem: Health Behavior/Discharge Planning: Goal: Ability  to identify and utilize available resources and services will improve Outcome: Progressing Goal: Ability to manage health-related needs will improve Outcome: Progressing   Problem: Metabolic: Goal: Ability to maintain appropriate glucose levels will improve Outcome: Progressing   Problem: Nutritional: Goal: Maintenance  of adequate nutrition will improve Outcome: Progressing Goal: Progress toward achieving an optimal weight will improve Outcome: Progressing   Problem: Skin Integrity: Goal: Risk for impaired skin integrity will decrease Outcome: Progressing   Problem: Tissue Perfusion: Goal: Adequacy of tissue perfusion will improve Outcome: Progressing

## 2022-12-16 NOTE — Inpatient Diabetes Management (Addendum)
Inpatient Diabetes Program Recommendations  AACE/ADA: New Consensus Statement on Inpatient Glycemic Control   Target Ranges:  Prepandial:   less than 140 mg/dL      Peak postprandial:   less than 180 mg/dL (1-2 hours)      Critically ill patients:  140 - 180 mg/dL    Latest Reference Range & Units 12/15/22 08:12 12/15/22 11:07 12/15/22 15:19 12/15/22 19:24 12/15/22 23:37 12/16/22 03:33 12/16/22 07:23  Glucose-Capillary 70 - 99 mg/dL 664 (H) 403 (H) 474 (H) 236 (H) 238 (H) 250 (H) 271 (H)    Latest Reference Range & Units 12/15/22 08:54  Hemoglobin A1C 4.8 - 5.6 % 8.2 (H)   Review of Glycemic Control  Diabetes history: DM2 Outpatient Diabetes medications: Lantus 24 units QAM, Lantus 60 units QPM, Novolog 24-30 units TID with meals, Farxiga 10 mg daily Current orders for Inpatient glycemic control: Semglee 20 units BID, Novolog 0-20 units Q4H  Inpatient Diabetes Program Recommendations:    Insulin: No longer ordered Decadron (last dose of 8 mg given at 23:43 on 12/15/22). Please consider increasing Semglee to 25 units BID. Once patient is eating and tolerating diet well, may want to consider ordering Novolog 5 units TID with meals.  NOTE: Noted consult for inpatient diabetes coordinator. Patient admitted with throat swelling from peritonsillar infection and was ordered Decadron 8 mg Q6H (received 4 times on 12/15/22; last dose given at 23:43 on 12/15/22). Anticipate glucose will improve as steroids wear off.   Addendum 12/16/22@11 :40--Spoke with patient at bedside about diabetes and home regimen for diabetes control. Patient reports being followed by Dennie Maizes with [facility] for diabetes management but she will need to change to a new Endocrinologist since they do not accept the insurance she now has.  Patient states she is consistently taking Lantus 24 units QAM, Lantus 60 units QPM, Novolog 24-30 units TID with meals, and Farxiga 10 mg daily as an outpatient for diabetes control.  Patient reports she uses Dexcom G7 CGM for glucose monitoring and her average glucose over prior week was 226 mg/dl. Patient states that her Dexcom G7 CGM sensor was removed when on 8/15 after she arrived at the hospital before CT scan.   Patient states that after she had oral surgery recently, she did not take any insulin for about 1 week. Patient states she had a low CBG in 30's the day after surgery and since she was not able to eat she was scared to take any insulin. Patient reports that her prior A1C was almost 11%.  Discussed A1C results (8.2% on 12/15/22) and explained that current A1C indicates an average glucose of 189 mg/dl over the past 2-3 months. Discussed glucose and A1C goals. Discussed importance of checking CBGs and maintaining good CBG control to prevent long-term and short-term complications. Stressed to the patient the importance of improving glycemic control to prevent further complications from uncontrolled diabetes. Patient notes that she has tried Ozempic (cause N/V) and Trulicity (caused N/V, gastroparesis) in the past. Patient states she plans to make changes to help her lose weight and get DM under better control.  Asked patient to call local Endocrinologist offices and find provider that takes her insurance so she can get established with a new Endocrinologist that could help get DM under better control. Discussed current insulin orders and explained that she received Decadron 8 mg Q6H (given 4 times on 8/15) which is contributing to hyperglycemia. Patient states that she has only tried full liquid diet so far and she  plans to try a soft diet for lunch today. Discussed that it would be recommended to increase Semglee today and that if she tolerates diet, will likely need to add meal coverage insulin. Provided patient with Dexcom G7 sensor sample to replace the one that was removed prior to CT.  Patient verbalized understanding of information discussed and reports no further questions at  this time related to diabetes.    Thanks, Orlando Penner, RN, MSN, CDCES Diabetes Coordinator Inpatient Diabetes Program [phone] (Team Pager from 8am to 5pm)

## 2022-12-16 NOTE — Plan of Care (Signed)
  Problem: Education: Goal: Knowledge of General Education information will improve Description: Including pain rating scale, medication(s)/side effects and non-pharmacologic comfort measures Outcome: Progressing   Problem: Health Behavior/Discharge Planning: Goal: Ability to manage health-related needs will improve Outcome: Progressing   Problem: Clinical Measurements: Goal: Ability to maintain clinical measurements within normal limits will improve Outcome: Progressing Goal: Diagnostic test results will improve Outcome: Progressing Goal: Respiratory complications will improve Outcome: Progressing Goal: Cardiovascular complication will be avoided Outcome: Progressing   Problem: Activity: Goal: Risk for activity intolerance will decrease Outcome: Progressing   Problem: Nutrition: Goal: Adequate nutrition will be maintained Outcome: Progressing   Problem: Coping: Goal: Level of anxiety will decrease Outcome: Progressing   Problem: Elimination: Goal: Will not experience complications related to urinary retention Outcome: Progressing   Problem: Pain Managment: Goal: General experience of comfort will improve Outcome: Progressing   Problem: Safety: Goal: Ability to remain free from injury will improve Outcome: Progressing   Problem: Skin Integrity: Goal: Risk for impaired skin integrity will decrease Outcome: Progressing

## 2022-12-17 ENCOUNTER — Inpatient Hospital Stay (HOSPITAL_COMMUNITY): Payer: Medicaid Other | Admitting: Anesthesiology

## 2022-12-17 ENCOUNTER — Other Ambulatory Visit: Payer: Self-pay

## 2022-12-17 ENCOUNTER — Encounter (HOSPITAL_COMMUNITY)
Admission: EM | Disposition: A | Discharge status: Home / Self Care | Payer: Self-pay | Source: Home / Self Care | Attending: Pulmonary Disease

## 2022-12-17 ENCOUNTER — Inpatient Hospital Stay (HOSPITAL_COMMUNITY): Payer: Medicaid Other

## 2022-12-17 ENCOUNTER — Encounter (HOSPITAL_COMMUNITY): Payer: Self-pay | Admitting: Critical Care Medicine

## 2022-12-17 DIAGNOSIS — G4733 Obstructive sleep apnea (adult) (pediatric): Secondary | ICD-10-CM

## 2022-12-17 DIAGNOSIS — I10 Essential (primary) hypertension: Secondary | ICD-10-CM | POA: Diagnosis not present

## 2022-12-17 DIAGNOSIS — K122 Cellulitis and abscess of mouth: Secondary | ICD-10-CM

## 2022-12-17 DIAGNOSIS — Z87891 Personal history of nicotine dependence: Secondary | ICD-10-CM | POA: Diagnosis not present

## 2022-12-17 DIAGNOSIS — J36 Peritonsillar abscess: Secondary | ICD-10-CM | POA: Diagnosis not present

## 2022-12-17 HISTORY — PX: INCISION AND DRAINAGE ABSCESS: SHX5864

## 2022-12-17 LAB — GLUCOSE, CAPILLARY
Glucose-Capillary: 137 mg/dL — ABNORMAL HIGH (ref 70–99)
Glucose-Capillary: 204 mg/dL — ABNORMAL HIGH (ref 70–99)
Glucose-Capillary: 219 mg/dL — ABNORMAL HIGH (ref 70–99)
Glucose-Capillary: 215 mg/dL — ABNORMAL HIGH (ref 70–99)
Glucose-Capillary: 220 mg/dL — ABNORMAL HIGH (ref 70–99)
Glucose-Capillary: 211 mg/dL — ABNORMAL HIGH (ref 70–99)
Glucose-Capillary: 196 mg/dL — ABNORMAL HIGH (ref 70–99)

## 2022-12-17 SURGERY — INCISION AND DRAINAGE, ABSCESS
Anesthesia: General

## 2022-12-17 MED ORDER — DEXAMETHASONE SODIUM PHOSPHATE 10 MG/ML IJ SOLN
8.0000 mg | Freq: Four times a day (QID) | INTRAMUSCULAR | Status: DC
  Administered 2022-12-17 – 2022-12-18 (×5): 8 mg via INTRAVENOUS
  Filled 2022-12-17: qty 1
  Filled 2022-12-17: qty 0.8
  Filled 2022-12-17 (×4): qty 1

## 2022-12-17 MED ORDER — LACTATED RINGERS IV SOLN: INTRAVENOUS | Status: DC

## 2022-12-17 MED ORDER — DEXAMETHASONE SODIUM PHOSPHATE 10 MG/ML IJ SOLN
INTRAMUSCULAR | Status: AC
  Filled 2022-12-17: qty 1

## 2022-12-17 MED ORDER — KETOROLAC TROMETHAMINE 30 MG/ML IJ SOLN
INTRAMUSCULAR | Status: AC
  Filled 2022-12-17: qty 1

## 2022-12-17 MED ORDER — SCOPOLAMINE 1 MG/3DAYS TD PT72
MEDICATED_PATCH | TRANSDERMAL | Status: DC | PRN
  Administered 2022-12-17: 1 via TRANSDERMAL

## 2022-12-17 MED ORDER — FENTANYL CITRATE PF 50 MCG/ML IJ SOSY
25.0000 ug | PREFILLED_SYRINGE | INTRAMUSCULAR | Status: DC | PRN
  Administered 2022-12-17 – 2022-12-18 (×8): 25 ug via INTRAVENOUS
  Filled 2022-12-17 (×9): qty 1

## 2022-12-17 MED ORDER — LIDOCAINE 2% (20 MG/ML) 5 ML SYRINGE
INTRAMUSCULAR | Status: DC | PRN
  Administered 2022-12-17: 100 mg via INTRAVENOUS

## 2022-12-17 MED ORDER — SUCCINYLCHOLINE CHLORIDE 20 MG/ML IJ SOLN
INTRAMUSCULAR | Status: DC | PRN
  Administered 2022-12-17: 140 mg via INTRAVENOUS

## 2022-12-17 MED ORDER — INSULIN GLARGINE-YFGN 100 UNIT/ML ~~LOC~~ SOLN
25.0000 [IU] | Freq: Two times a day (BID) | SUBCUTANEOUS | Status: DC
  Administered 2022-12-17: 25 [IU] via SUBCUTANEOUS
  Filled 2022-12-17 (×3): qty 0.25

## 2022-12-17 MED ORDER — FENTANYL CITRATE (PF) 250 MCG/5ML IJ SOLN
INTRAMUSCULAR | Status: AC
  Filled 2022-12-17: qty 5

## 2022-12-17 MED ORDER — ACETAMINOPHEN 10 MG/ML IV SOLN
INTRAVENOUS | Status: AC
  Filled 2022-12-17: qty 100

## 2022-12-17 MED ORDER — ROCURONIUM BROMIDE 100 MG/10ML IV SOLN
INTRAVENOUS | Status: DC | PRN
  Administered 2022-12-17: 50 mg via INTRAVENOUS

## 2022-12-17 MED ORDER — IOHEXOL 350 MG/ML SOLN
75.0000 mL | Freq: Once | INTRAVENOUS | Status: AC | PRN
  Administered 2022-12-17: 75 mL via INTRAVENOUS

## 2022-12-17 MED ORDER — ORAL CARE MOUTH RINSE: 15.0000 mL | Freq: Once | OROMUCOSAL | Status: DC

## 2022-12-17 MED ORDER — SUGAMMADEX SODIUM 200 MG/2ML IV SOLN
INTRAVENOUS | Status: DC | PRN
  Administered 2022-12-17: 300 mg via INTRAVENOUS

## 2022-12-17 MED ORDER — ACETAMINOPHEN 10 MG/ML IV SOLN
INTRAVENOUS | Status: DC | PRN
Start: 2022-12-17 — End: 2022-12-17
  Administered 2022-12-17: 1000 mg via INTRAVENOUS

## 2022-12-17 MED ORDER — ONDANSETRON HCL 4 MG/2ML IJ SOLN
INTRAMUSCULAR | Status: DC | PRN
  Administered 2022-12-17: 4 mg via INTRAVENOUS

## 2022-12-17 MED ORDER — ROCURONIUM BROMIDE 10 MG/ML (PF) SYRINGE
PREFILLED_SYRINGE | INTRAVENOUS | Status: AC
  Filled 2022-12-17: qty 10

## 2022-12-17 MED ORDER — FENTANYL CITRATE (PF) 100 MCG/2ML IJ SOLN: 25.0000 ug | INTRAMUSCULAR | Status: DC | PRN

## 2022-12-17 MED ORDER — CARMEX CLASSIC LIP BALM EX OINT
TOPICAL_OINTMENT | CUTANEOUS | Status: DC | PRN
  Filled 2022-12-17: qty 10

## 2022-12-17 MED ORDER — 0.9 % SODIUM CHLORIDE (POUR BTL) OPTIME
TOPICAL | Status: DC | PRN
  Administered 2022-12-17: 1000 mL

## 2022-12-17 MED ORDER — DEXMEDETOMIDINE HCL IN NACL 80 MCG/20ML IV SOLN
INTRAVENOUS | Status: AC
  Filled 2022-12-17: qty 20

## 2022-12-17 MED ORDER — SUCCINYLCHOLINE CHLORIDE 200 MG/10ML IV SOSY
PREFILLED_SYRINGE | INTRAVENOUS | Status: AC
  Filled 2022-12-17: qty 10

## 2022-12-17 MED ORDER — PROPOFOL 10 MG/ML IV BOLUS
INTRAVENOUS | Status: AC
  Filled 2022-12-17: qty 20

## 2022-12-17 MED ORDER — LACTATED RINGERS IV SOLN
INTRAVENOUS | Status: DC | PRN
Start: 2022-12-17 — End: 2022-12-17

## 2022-12-17 MED ORDER — FENTANYL CITRATE (PF) 100 MCG/2ML IJ SOLN
INTRAMUSCULAR | Status: DC | PRN
  Administered 2022-12-17: 50 ug via INTRAVENOUS

## 2022-12-17 MED ORDER — LIDOCAINE-EPINEPHRINE 1 %-1:100000 IJ SOLN
INTRAMUSCULAR | Status: DC | PRN
  Administered 2022-12-17: 1 mL

## 2022-12-17 MED ORDER — CHLORHEXIDINE GLUCONATE 0.12 % MT SOLN: 15.0000 mL | Freq: Once | OROMUCOSAL | Status: DC

## 2022-12-17 MED ORDER — INSULIN ASPART 100 UNIT/ML IJ SOLN
0.0000 [IU] | INTRAMUSCULAR | Status: DC | PRN
  Administered 2022-12-17: 4 [IU] via SUBCUTANEOUS

## 2022-12-17 MED ORDER — ONDANSETRON HCL 4 MG/2ML IJ SOLN
INTRAMUSCULAR | Status: AC
  Filled 2022-12-17: qty 2

## 2022-12-17 MED ORDER — LIDOCAINE 2% (20 MG/ML) 5 ML SYRINGE
INTRAMUSCULAR | Status: AC
  Filled 2022-12-17: qty 5

## 2022-12-17 MED ORDER — PROPOFOL 10 MG/ML IV BOLUS
INTRAVENOUS | Status: DC | PRN
  Administered 2022-12-17: 200 mg via INTRAVENOUS

## 2022-12-17 MED ORDER — LIDOCAINE-EPINEPHRINE 1 %-1:100000 IJ SOLN
INTRAMUSCULAR | Status: AC
  Filled 2022-12-17: qty 1

## 2022-12-17 MED ORDER — ONDANSETRON HCL 4 MG/2ML IJ SOLN: 4.0000 mg | Freq: Once | INTRAMUSCULAR | Status: DC | PRN

## 2022-12-17 MED ORDER — AMISULPRIDE (ANTIEMETIC) 5 MG/2ML IV SOLN: 10.0000 mg | Freq: Once | INTRAVENOUS | Status: DC | PRN

## 2022-12-17 MED ORDER — MIDAZOLAM HCL 2 MG/2ML IJ SOLN
INTRAMUSCULAR | Status: AC
  Filled 2022-12-17: qty 2

## 2022-12-17 SURGICAL SUPPLY — 33 items
BLADE SURG 15 STRL LF DISP TIS (BLADE) IMPLANT
BLADE SURG 15 STRL SS (BLADE) ×1
CANISTER SUCT 3000ML PPV (MISCELLANEOUS) ×1 IMPLANT
CATH ROBINSON RED A/P 12FR (CATHETERS) IMPLANT
COVER SURGICAL LIGHT HANDLE (MISCELLANEOUS) ×1 IMPLANT
DRAIN PENROSE 0.25X18 (DRAIN) IMPLANT
DRAPE HALF SHEET 40X57 (DRAPES) ×1 IMPLANT
GAUZE 4X4 16PLY ~~LOC~~+RFID DBL (SPONGE) IMPLANT
GLOVE BIO SURGEON STRL SZ7.5 (GLOVE) ×1 IMPLANT
GOWN STRL REUS W/ TWL LRG LVL3 (GOWN DISPOSABLE) ×2 IMPLANT
GOWN STRL REUS W/TWL LRG LVL3 (GOWN DISPOSABLE) ×2
KIT BASIN OR (CUSTOM PROCEDURE TRAY) ×1 IMPLANT
KIT TURNOVER KIT B (KITS) ×1 IMPLANT
MAT PREVALON FULL STRYKER (MISCELLANEOUS) IMPLANT
NDL HYPO 25GX1X1/2 BEV (NEEDLE) IMPLANT
NEEDLE HYPO 25GX1X1/2 BEV (NEEDLE) ×1 IMPLANT
NS IRRIG 1000ML POUR BTL (IV SOLUTION) ×1 IMPLANT
PACK BASIC III (CUSTOM PROCEDURE TRAY) ×1
PACK SRG BSC III STRL LF ECLPS (CUSTOM PROCEDURE TRAY) IMPLANT
PAD ARMBOARD 7.5X6 YLW CONV (MISCELLANEOUS) ×2 IMPLANT
POSITIONER HEAD DONUT 9IN (MISCELLANEOUS) IMPLANT
SPIKE FLUID TRANSFER (MISCELLANEOUS) ×1 IMPLANT
SUT CHROMIC 3 0 PS 2 (SUTURE) ×1 IMPLANT
SUT MNCRL AB 4-0 PS2 18 (SUTURE) ×4 IMPLANT
SUT SILK 2 0 SH (SUTURE) IMPLANT
SUT SILK 2 0SH CR/8 30 (SUTURE) IMPLANT
SWAB COLLECTION DEVICE MRSA (MISCELLANEOUS) IMPLANT
SWAB CULTURE ESWAB REG 1ML (MISCELLANEOUS) IMPLANT
SYR CONTROL 10ML LL (SYRINGE) ×1 IMPLANT
TOWEL GREEN STERILE FF (TOWEL DISPOSABLE) ×2 IMPLANT
TUBE CONNECTING 12X1/4 (SUCTIONS) ×1 IMPLANT
WATER STERILE IRR 1000ML POUR (IV SOLUTION) ×1 IMPLANT
YANKAUER SUCT BULB TIP NO VENT (SUCTIONS) ×1 IMPLANT

## 2022-12-17 NOTE — Progress Notes (Signed)
PCCM INTERVAL PROGRESS NOTE   Called to bedside to evaluate patient with concern for airway edema.   Briefly this is a 42 year old female who presented to the emergency department with facial swelling following tooth extraction.  CT at that time was concerning for some airway edema.  She is admitted to the ICU and treated with IV antibiotics and steroids.  She was transferred out of ICU earlier today and was feeling better, however, tonight she feels as though the swelling is getting worse.  Her pain is increased significantly, it is more difficult to talk, and she is having more difficulty managing secretions.  Sounds like this all started after trying solid food earlier today.  On my examination she is awake, alert, oriented and tearful.  Her face is erythematous which I am told is the case at baseline, although it is currently worse per family.  She also has significant left facial and neck edema.  She is phonating well and has no stridor.  Her work of breathing is normal.  Considering her perceived worsening of the swelling and "throat closing" we will transfer back to ICU and start her back on dexamethasone.  As needed fentanyl for pain as she was getting when she was in ICU previously.  I am hopeful she will not require artificial airway as she is phonating well and has no stridor.    Joneen Roach, AGACNP-BC [facility]  See Amion for personal pager PCCM on call pager [phone] until 7pm. Please call Elink 7p-7a. [phone]  12/17/2022 2:40 AM

## 2022-12-17 NOTE — Anesthesia Procedure Notes (Signed)
Procedure Name: Intubation Date/Time: 12/17/2022 4:45 PM  Performed by: Edmonia Caprio, CRNAPre-anesthesia Checklist: Patient identified, Emergency Drugs available, Suction available, Patient being monitored and Timeout performed Patient Re-evaluated:Patient Re-evaluated prior to induction Oxygen Delivery Method: Circle system utilized Preoxygenation: Pre-oxygenation with 100% oxygen Induction Type: IV induction and Rapid sequence Laryngoscope Size: Glidescope and 3 Grade View: Grade I Tube type: Oral Tube size: 7.0 mm Number of attempts: 1 Airway Equipment and Method: Stylet and Video-laryngoscopy Placement Confirmation: positive ETCO2, ETT inserted through vocal cords under direct vision and breath sounds checked- equal and bilateral Secured at: 23 cm Tube secured with: Tape Dental Injury: Teeth and Oropharynx as per pre-operative assessment  Difficulty Due To: Difficult Airway- due to limited oral opening and Difficult Airway-  due to edematous airway

## 2022-12-17 NOTE — Progress Notes (Signed)
NAME:  Kathleen Walsh, MRN:  [id number], DOB:  1980/05/12, LOS: 2 ADMISSION DATE:  12/14/2022, CONSULTATION DATE:  8/15 REFERRING MD:  PA Faulkner-ED, CHIEF COMPLAINT:  airway edema   History of Present Illness:  Kathleen Walsh is a 42 y/o woman with a history of DM who presented with swelling in the throat and developing SOB on the left side after getting a tooth pulled 6 days prior to presentation. She has had teeth pulled previously uneventfully, but this time began getting fullness in her throat and below her jaw on the left side. She is not on immunosuppressive medications. She has a history of lupus antiphospholipid antibody syndrome but is not on [facility]; she has only had clots and required AC during pregnancies. She has improved since coming to the ED- she does not feel that her throat is as swollen. She can swallow her secretions and has been able to talk. She has pain with swallowing. She does not feel SOB. ENT has been consulted. In the ED she received zosyn & dexamethasone.   Pertinent  Medical History  OSA HTN DM HLD History of lupus antiphospholipid antibody syndrome anemia  Significant Hospital Events: Including procedures, antibiotic start and stop dates in addition to other pertinent events   8/15 admitted, started on Vanc/Zosyn, received dexamethasone  8/16 switched to Unasyn, transferred to floor, TRH to assume care 8/17 8/17 back to ICU after sensation of increased throat edema, throat closing, dyspnea  Interim History / Subjective:     Objective   Blood pressure (!) 123/54, pulse 71, temperature 97.7 F (36.5 C), temperature source Oral, resp. rate 18, height 5\' 6"  (1.676 m), weight 133.9 kg, last menstrual period 08/31/2015, SpO2 96%.        Intake/Output Summary (Last 24 hours) at 12/17/2022 1019 Last data filed at 12/17/2022 0600 Gross per 24 hour  Intake 880 ml  Output --  Net 880 ml   Filed Weights   12/14/22 2357 12/16/22 0500 12/17/22 0408  Weight: (!) 149 kg  129.3 kg 133.9 kg    Examination: General: Morbidly obese woman in bed on CPAP HENT: Unable/unwilling to open mouth wide, tongue appears to be normal size.  Phonating normally.  Left submandibular and neck areas are tender to palpation, warm with some induration and fullness.  Right side normal.  No stridor.  Managing secretions Lungs: Clear, no wheeze Cardiovascular: Regular, distant, no murmur Abdomen: Obese, nondistended with positive bowel sounds Extremities: No edema Neuro: Awake, alert, appropriate, answers questions and follows commands Derm: Face is flushed, extends down to the neck   Ancillary tests personally reviewed:     Assessment & Plan:   Peritonsillar infection, possibly abscess, after having tooth extracted Upper airway edema and possible airway compromise -Dexamethasone restarted overnight (had completed 5 doses).  Would be reasonable to taper more slowly given her transient worsening -IV fluids held -Continue IV Unasyn -? Whether she will need repeat CT neck, will decide depending on clinical trend -Enteral diet and meds if she can tolerate -Tramadol, Tylenol for pain control  Poorly controlled DM type 2 -Continue sliding scale insulin per protocol -Semglee 20 units twice daily -Diabetes coordinator recs appreciated -Holding [place].  Would probably be a good GLP-1 candidate  OSA -Tolerates CPAP while sleeping  HTN Stable -HCTZ and lisinopril currently on hold, restart as blood pressure increases  Obesity -Weight loss long-term  HLD -Continue rosuvastatin  Best Practice (right click and "Reselect all SmartList Selections" daily)   Diet/type: Regular consistency (see  orders) DVT prophylaxis: prophylactic heparin  GI prophylaxis: N/A Lines: N/A Foley:  N/A Code Status:  full code Last date of multidisciplinary goals of care discussion [ Patient was updated at the bedside.]  Independent critical care time 32 minutes  Levy Pupa, MD,  PhD 12/17/2022, 10:25 AM [facility] [phone] or if no answer before 7:00PM call [phone] For any issues after 7:00PM please call eLink [phone]

## 2022-12-17 NOTE — Anesthesia Preprocedure Evaluation (Signed)
Anesthesia Evaluation  Patient identified by MRN, date of birth, ID band Patient awake  General Assessment Comment:Left peri-mandibular infection following dental extraction  Reviewed: Allergy & Precautions, NPO status , Patient's Chart, lab work & pertinent test results  History of Anesthesia Complications (+) PONV and history of anesthetic complications  Airway Mallampati: Unable to assess  TM Distance: >3 FB Neck ROM: Limited  Mouth opening: Limited Mouth Opening  Dental  (+) Dental Advisory Given, Missing   Pulmonary shortness of breath, sleep apnea , former smoker   Pulmonary exam normal breath sounds clear to auscultation       Cardiovascular hypertension, Normal cardiovascular exam Rhythm:Regular Rate:Normal     Neuro/Psych  Headaches PSYCHIATRIC DISORDERS Anxiety Depression       GI/Hepatic negative GI ROS, Neg liver ROS,,,  Endo/Other  diabetes, Type 2  Morbid obesity  Renal/GU negative Renal ROS     Musculoskeletal negative musculoskeletal ROS (+)    Abdominal   Peds  Hematology  (+) Blood dyscrasia, anemia   Anesthesia Other Findings Day of surgery medications reviewed with the patient.  Reproductive/Obstetrics                              Anesthesia Physical Anesthesia Plan  ASA: 3 and emergent  Anesthesia Plan: General   Post-op Pain Management: Ofirmev IV (intra-op)*   Induction: Intravenous  PONV Risk Score and Plan: 4 or greater and Midazolam, Dexamethasone and Ondansetron  Airway Management Planned: Oral ETT, Video Laryngoscope Planned and Nasal ETT  Additional Equipment:   Intra-op Plan:   Post-operative Plan: Possible Post-op intubation/ventilation  Informed Consent: I have reviewed the patients History and Physical, chart, labs and discussed the procedure including the risks, benefits and alternatives for the proposed anesthesia with the patient or  authorized representative who has indicated his/her understanding and acceptance.     Dental advisory given  Plan Discussed with: CRNA  Anesthesia Plan Comments:          Anesthesia Quick Evaluation

## 2022-12-17 NOTE — Anesthesia Postprocedure Evaluation (Signed)
Anesthesia Post Note  Patient: Kathleen Walsh  Procedure(s) Performed: INCISION AND DRAINAGE PERIMANDIBULAR ABSCESS     Patient location during evaluation: PACU Anesthesia Type: General Level of consciousness: awake and alert Pain management: pain level controlled Vital Signs Assessment: post-procedure vital signs reviewed and stable Respiratory status: spontaneous breathing, nonlabored ventilation, respiratory function stable and patient connected to nasal cannula oxygen Cardiovascular status: blood pressure returned to baseline and stable Postop Assessment: no apparent nausea or vomiting Anesthetic complications: no   No notable events documented.  Last Vitals:  Vitals:   12/17/22 1800 12/17/22 1815  BP: (!) 140/78 (!) 146/77  Pulse: 67 68  Resp: 12 20  Temp:  36.6 C  SpO2: 94% 95%    Last Pain:  Vitals:   12/17/22 1815  TempSrc:   PainSc: 0-No pain                 Collene Schlichter

## 2022-12-17 NOTE — Plan of Care (Signed)
  Problem: Education: Goal: Knowledge of General Education information will improve Description: Including pain rating scale, medication(s)/side effects and non-pharmacologic comfort measures Outcome: Progressing   Problem: Health Behavior/Discharge Planning: Goal: Ability to manage health-related needs will improve Outcome: Progressing   Problem: Clinical Measurements: Goal: Ability to maintain clinical measurements within normal limits will improve Outcome: Progressing Goal: Will remain free from infection Outcome: Progressing Goal: Diagnostic test results will improve Outcome: Progressing Goal: Respiratory complications will improve Outcome: Progressing Goal: Cardiovascular complication will be avoided Outcome: Progressing   Problem: Activity: Goal: Risk for activity intolerance will decrease Outcome: Progressing   Problem: Nutrition: Goal: Adequate nutrition will be maintained Outcome: Progressing   Problem: Coping: Goal: Level of anxiety will decrease Outcome: Progressing   Problem: Pain Managment: Goal: General experience of comfort will improve Outcome: Progressing   Problem: Safety: Goal: Ability to remain free from injury will improve Outcome: Progressing   Problem: Skin Integrity: Goal: Risk for impaired skin integrity will decrease Outcome: Progressing   Problem: Education: Goal: Ability to describe self-care measures that may prevent or decrease complications (Diabetes Survival Skills Education) will improve Outcome: Progressing Goal: Individualized Educational Video(s) Outcome: Progressing   Problem: Coping: Goal: Ability to adjust to condition or change in health will improve Outcome: Progressing   Problem: Fluid Volume: Goal: Ability to maintain a balanced intake and output will improve Outcome: Progressing   Problem: Health Behavior/Discharge Planning: Goal: Ability to identify and utilize available resources and services will  improve Outcome: Progressing Goal: Ability to manage health-related needs will improve Outcome: Progressing   Problem: Metabolic: Goal: Ability to maintain appropriate glucose levels will improve Outcome: Progressing   Problem: Nutritional: Goal: Maintenance of adequate nutrition will improve Outcome: Progressing Goal: Progress toward achieving an optimal weight will improve Outcome: Progressing   Problem: Skin Integrity: Goal: Risk for impaired skin integrity will decrease Outcome: Progressing   Problem: Tissue Perfusion: Goal: Adequacy of tissue perfusion will improve Outcome: Progressing   

## 2022-12-17 NOTE — Progress Notes (Signed)
Subjective: Upgraded status to ICU noted.  Her breathing feels better now than it did at transfer but her left upper neck swelling and pain feel worse.  Objective: Vital signs in last 24 hours: Temp:  [97.6 F (36.4 C)-98.3 F (36.8 C)] 97.7 F (36.5 C) (08/17 0746) Pulse Rate:  [55-81] 71 (08/17 0800) Resp:  [14-33] 18 (08/17 0800) BP: (123-151)/(54-93) 123/54 (08/17 0800) SpO2:  [92 %-99 %] 96 % (08/17 0800) Weight:  [133.9 kg] 133.9 kg (08/17 0408) Wt Readings from Last 1 Encounters:  12/17/22 133.9 kg    Intake/Output from previous day: 08/16 0701 - 08/17 0700 In: 1768.2 [P.O.:780; I.V.:457.6; IV Piggyback:530.6] Out: -  Intake/Output this shift: No intake/output data recorded.  General appearance: alert, cooperative, no distress, and uncomfortable Throat: left lower dental extraction site, tender within mouth about mandible Neck: left upper neck with firm edema and marked tenderness  Recent Labs    12/15/22 0854 12/16/22 0247  WBC 19.0* 15.2*  HGB 13.2 11.7*  HCT 42.0 37.1  PLT 234 223    Recent Labs    12/15/22 0208 12/15/22 0854 12/16/22 0247  NA 136  --  133*  K 3.9  --  4.5  CL 104  --  104  CO2 22  --  20*  GLUCOSE 219*  --  274*  BUN 12  --  14  CREATININE 0.70 0.68 0.57  CALCIUM 8.6*  --  8.4*    Medications: I have reviewed the patient's current medications.  Assessment/Plan: Left peri-mandibular infection following dental extraction  Emphasis that this is not a peritonsillar infection.  This is an infection related to her dental extraction and, as such, should be under the care of an oral surgeon.  The hospital system does not have any oral surgery coverage.  I discussed her case with CCM.  With progression of her swelling, we will proceed with an updated neck CT and consider incision and drainage, either through the mouth or through the neck.   LOS: 2 days   Christia Reading 12/17/2022, 11:47 AM

## 2022-12-17 NOTE — Plan of Care (Signed)
  Problem: Clinical Measurements: Goal: Will remain free from infection Outcome: Not Progressing   Problem: Clinical Measurements: Goal: Diagnostic test results will improve Outcome: Not Progressing   Problem: Clinical Measurements: Goal: Respiratory complications will improve Outcome: Not Progressing   Problem: Coping: Goal: Level of anxiety will decrease Outcome: Not Progressing   Problem: Pain Managment: Goal: General experience of comfort will improve Outcome: Not Progressing

## 2022-12-17 NOTE — Transfer of Care (Signed)
Immediate Anesthesia Transfer of Care Note  Patient: Kathleen Walsh  Procedure(s) Performed: INCISION AND DRAINAGE PERIMANDIBULAR ABSCESS  Patient Location: PACU  Anesthesia Type:General  Level of Consciousness: awake and alert   Airway & Oxygen Therapy: Patient Spontanous Breathing and Patient connected to face mask oxygen.    Patient requested nasal airway to be removed, wide awake and maintaining airway; so nasal airway removed without complication.  Post-op Assessment: Report given to RN and Post -op Vital signs reviewed and stable  Post vital signs: Reviewed and stable  Last Vitals:  Vitals Value Taken Time  BP 142/85 12/17/22 1738  Temp    Pulse 78 12/17/22 1743  Resp 16 12/17/22 1743  SpO2 99 % 12/17/22 1743  Vitals shown include unfiled device data.  Last Pain:  Vitals:   12/17/22 1526  TempSrc: Oral  PainSc: 1       Patients Stated Pain Goal: 1 (12/16/22 2127)  Complications: No notable events documented.

## 2022-12-17 NOTE — Progress Notes (Signed)
Relayed to PCCM at [id number] that patient feels throat tightening with difficulty of swallowing saliva, vitals are good, patient is not in respiratory distress.

## 2022-12-17 NOTE — Progress Notes (Signed)
eLink Physician-Brief Progress Note Patient Name: Kathleen Walsh DOB: [birth year] MRN: [id number]   Date of Service  12/17/2022  HPI/Events of Note  Patient with peri-tonsillar cellulitis now complaining of feeling that her throat is closing up on her. Room has no camera.  eICU Interventions  PCCM ground crew requested to see patient stat.        Thomasene Lot Blakley Michna 12/17/2022, 2:24 AM

## 2022-12-17 NOTE — Brief Op Note (Signed)
12/17/2022  5:18 PM  PATIENT:  Kathleen Walsh  42 y.o. female  PRE-OPERATIVE DIAGNOSIS:  Perimandibular Abscess  POST-OPERATIVE DIAGNOSIS:  Perimandibular Abscess  PROCEDURE:  Procedure(s): INCISION AND DRAINAGE PERIMANDIBULAR ABSCESS (N/A)  SURGEON:  Surgeons and Role:    Christia Reading, MD - Primary  PHYSICIAN ASSISTANT:   ASSISTANTS: none   ANESTHESIA:   general  EBL:  minimal   BLOOD ADMINISTERED:none  DRAINS: Penrose drain in the left mouth    LOCAL MEDICATIONS USED:  LIDOCAINE   SPECIMEN:  No Specimen  DISPOSITION OF SPECIMEN:  N/A  COUNTS:  YES  TOURNIQUET:  * No tourniquets in log *  DICTATION: .Note written in EPIC  PLAN OF CARE:  Return to ICU  PATIENT DISPOSITION:  PACU - hemodynamically stable.   Delay start of Pharmacological VTE agent (>24hrs) due to surgical blood loss or risk of bleeding: no

## 2022-12-17 NOTE — Op Note (Signed)
Preop diagnosis: Left perimandibular abscess Postop diagnosis: same Procedure: Incision and drainage of left perimandibular abscess Surgeon: Jenne Pane Anesth: General endotracheal anesthesia and local with 1% lidocaine with 1:100,000 epinephrine Compl: None Findings: Yellow pus flowed freely from medial and inferior masticator space. Description of procedure: After discussing risks, benefits, and alternatives, the patient was moved to the operative suite and placed on the operating room table in the supine position.  Anesthesia was induced and the patient was intubated easily via Glidescope.  The mouth was draped and a articulating retractor placed to hold the mouth open.  The lingual surface of the mandible was exposed and injected with local anesthetic.  An incision was made through the mucosa near the angle of the mandible on the lingual surface and a hemostat was used to immediately open the abscess.  Yellow pus flowed freely.  Cultures were taken.  The hemostat was then used to explore the space toward the medial pterygoid and also to the inferior surface of the mandible.  The space was copiously irrigated with saline using a red rubber catheter.  A 1/4 Penrose drain was then placed into the depth of the abscess cavity and secured at the mucosa using a 2-0 silk suture tying with a slide knot.  The drain and suture were trimmed.  The throat was suctioned.  The patient was returned to Anesthesia for wake-up, was extubated, and moved to the recovery room in stable condition.

## 2022-12-18 ENCOUNTER — Encounter (HOSPITAL_COMMUNITY): Payer: Self-pay | Admitting: Otolaryngology

## 2022-12-18 DIAGNOSIS — K047 Periapical abscess without sinus: Secondary | ICD-10-CM | POA: Diagnosis not present

## 2022-12-18 LAB — CBC
HCT: 38.2 % (ref 36.0–46.0)
Hemoglobin: 12 g/dL (ref 12.0–15.0)
MCH: 25.5 pg — ABNORMAL LOW (ref 26.0–34.0)
MCHC: 31.4 g/dL (ref 30.0–36.0)
MCV: 81.3 fL (ref 80.0–100.0)
Platelets: 234 10*3/uL (ref 150–400)
RBC: 4.7 MIL/uL (ref 3.87–5.11)
RDW: 14.4 % (ref 11.5–15.5)
WBC: 10.9 10*3/uL — ABNORMAL HIGH (ref 4.0–10.5)
nRBC: 0 % (ref 0.0–0.2)

## 2022-12-18 LAB — BASIC METABOLIC PANEL
Anion gap: 11 (ref 5–15)
BUN: 13 mg/dL (ref 6–20)
CO2: 24 mmol/L (ref 22–32)
Calcium: 8.7 mg/dL — ABNORMAL LOW (ref 8.9–10.3)
Chloride: 105 mmol/L (ref 98–111)
Creatinine, Ser: 0.67 mg/dL (ref 0.44–1.00)
GFR, Estimated: >60 mL/min (ref >60)
Glucose, Bld: 239 mg/dL — ABNORMAL HIGH (ref 70–99)
Potassium: 3.9 mmol/L (ref 3.5–5.1)
Sodium: 140 mmol/L (ref 135–145)

## 2022-12-18 LAB — MAGNESIUM: Magnesium: 2.2 mg/dL (ref 1.7–2.4)

## 2022-12-18 LAB — GLUCOSE, CAPILLARY
Glucose-Capillary: 224 mg/dL — ABNORMAL HIGH (ref 70–99)
Glucose-Capillary: 222 mg/dL — ABNORMAL HIGH (ref 70–99)
Glucose-Capillary: 207 mg/dL — ABNORMAL HIGH (ref 70–99)
Glucose-Capillary: 179 mg/dL — ABNORMAL HIGH (ref 70–99)

## 2022-12-18 LAB — PHOSPHORUS: Phosphorus: 3 mg/dL (ref 2.5–4.6)

## 2022-12-18 MED ORDER — LISINOPRIL 20 MG PO TABS
20.0000 mg | ORAL_TABLET | Freq: Every day | ORAL | Status: DC
  Administered 2022-12-19 – 2022-12-21 (×3): 20 mg via ORAL
  Filled 2022-12-18 (×3): qty 1

## 2022-12-18 MED ORDER — HYDRALAZINE HCL 20 MG/ML IJ SOLN: 10.0000 mg | INTRAMUSCULAR | Status: DC | PRN

## 2022-12-18 MED ORDER — HYDROCHLOROTHIAZIDE 25 MG PO TABS
25.0000 mg | ORAL_TABLET | Freq: Every day | ORAL | Status: DC
  Administered 2022-12-18 – 2022-12-21 (×4): 25 mg via ORAL
  Filled 2022-12-18 (×4): qty 1

## 2022-12-18 MED ORDER — INSULIN GLARGINE-YFGN 100 UNIT/ML ~~LOC~~ SOLN
30.0000 [IU] | Freq: Two times a day (BID) | SUBCUTANEOUS | Status: DC
  Administered 2022-12-18 (×2): 30 [IU] via SUBCUTANEOUS
  Filled 2022-12-18 (×4): qty 0.3

## 2022-12-18 MED ORDER — OXYCODONE HCL 5 MG PO TABS
5.0000 mg | ORAL_TABLET | ORAL | Status: DC | PRN
  Administered 2022-12-18: 10 mg via ORAL
  Administered 2022-12-18 (×2): 5 mg via ORAL
  Administered 2022-12-19 – 2022-12-21 (×11): 10 mg via ORAL
  Filled 2022-12-18 (×7): qty 2
  Filled 2022-12-18 (×2): qty 1
  Filled 2022-12-18 (×2): qty 2
  Filled 2022-12-18 (×2): qty 1
  Filled 2022-12-18 (×2): qty 2

## 2022-12-18 NOTE — Progress Notes (Signed)
NAME:  Kathleen Walsh, MRN:  [id number], DOB:  [birth year], LOS: 3 ADMISSION DATE:  12/14/2022, CONSULTATION DATE:  8/15 REFERRING MD:  PA Faulkner-ED, CHIEF COMPLAINT:  airway edema   History of Present Illness:  Kathleen Walsh is a 42 y/o woman with a history of DM who presented with swelling in the throat and developing SOB on the left side after getting a tooth pulled 6 days prior to presentation. She has had teeth pulled previously uneventfully, but this time began getting fullness in her throat and below her jaw on the left side. She is not on immunosuppressive medications. She has a history of lupus antiphospholipid antibody syndrome but is not on [facility]; she has only had clots and required AC during pregnancies. She has improved since coming to the ED- she does not feel that her throat is as swollen. She can swallow her secretions and has been able to talk. She has pain with swallowing. She does not feel SOB. ENT has been consulted. In the ED she received zosyn & dexamethasone.   Pertinent  Medical History  OSA HTN DM HLD History of lupus antiphospholipid antibody syndrome anemia  Significant Hospital Events: Including procedures, antibiotic start and stop dates in addition to other pertinent events   8/15 admitted, started on Vanc/Zosyn, received dexamethasone  8/16 switched to Unasyn, transferred to floor, TRH to assume care 8/17 8/17 back to ICU after sensation of increased throat edema, throat closing, dyspnea 8/18 underwent I&D of left mandible abscess with Dr. Jenne Pane ENT, Penrose drain placed  Interim History / Subjective:  Seen lying in bed with complaints of mild to moderate mouth/neck pain.   Objective   Blood pressure (!) 114/59, pulse (!) 52, temperature 97.6 F (36.4 C), temperature source Axillary, resp. rate (!) 23, height 5\' 6"  (1.676 m), weight (!) 140.7 kg, last menstrual period 08/31/2015, SpO2 92%.        Intake/Output Summary (Last 24 hours) at 12/18/2022 0840 Last  data filed at 12/18/2022 0500 Gross per 24 hour  Intake 1064.55 ml  Output 513 ml  Net 551.55 ml   Filed Weights   12/16/22 0500 12/17/22 0408 12/18/22 0500  Weight: 129.3 kg 133.9 kg (!) 140.7 kg    Examination: General: Acute on chronic ill-appearing morbidly obese female lying in bed in no acute distress HEENT: [place]/AT, MM pink/moist, PERRL, Penrose drain to right lower mouth Neuro: Alert and oriented x 3, nonfocal CV: s1s2 regular rate and rhythm, no murmur, rubs, or gallops,  PULM: Clear to auscultation bilaterally, no increased work of breathing, no added breath sounds GI: soft, bowel sounds active in all 4 quadrants, non-tender, non-distended Extremities: warm/dry, no edema  Skin: no rashes or lesions  Ancillary tests personally reviewed:     Assessment & Plan:   Peritonsillar infection, possibly abscess, after having tooth extracted Upper airway edema and possible airway compromise -8/18 underwent I&D of left mandible abscess with Dr. Jenne Pane ENT, Penrose drain placed P: Dexamethasone resumed 8/17 consider tapering slowly Continue IV Unasyn Penrose drain in place Ensure adequate pain control Resumption of oral diet per ID  Poorly controlled DM type 2 -Worsened with use of steroid P: Continue resistant scale SSI Increased Semglee 30 units twice daily Continue at bedtime SSI Hold Farxiga on hold  OSA P: Continue CPAP nightly  HTN P: Add as needed IV antihypertensives while n.p.o. Resume home HCTZ and lisinopril when able to consume orals  Obesity P: Discussed benefits of weight loss  HLD P: Continue home  statin  Can likely transfer out of ICU back to [facility] tomorrow   Best Practice (right click and "Reselect all SmartList Selections" daily)   Diet/type: Regular consistency (see orders) DVT prophylaxis: prophylactic heparin  GI prophylaxis: N/A Lines: N/A Foley:  N/A Code Status:  full code Last date of multidisciplinary goals of care discussion [  Patient was updated at the bedside.]  [name], NP-C [place] [facility] Personal contact information can be found on Amion  If no contact or response made please call [phone] 12/18/2022, 9:11 AM

## 2022-12-18 NOTE — Progress Notes (Signed)
PCCM progress note  Notified by RN that patient is reporting diarrhea with associated abdominal pain/bloating.  Leukocytosis persist with white blood cell count 10.9, of note WBC peaked at 19.0 on day of admission.  Patient remains afebrile  Chart reviewed and patient was reportedly on prophylactic clindamycin post dental extraction per outpatient orthodontist.  Called and confirmed with patient's outpatient pharmacy that patient was prescribed 7-day course of 300 mg 3 times daily of oral clindamycin.  Patient reports she took 81 of the 21 doses of clindamycin.  Reported course was cut short as she was admitted to this hospital stay.  During this admission patient has received: 8/15 - 8/16 3.375 g Zosyn every 8 hours  8/15 - 8/16 1250 mg IV vancomycin every 12 hours  8/16 3 g Unasyn every 6 hours that began 8/16 and remains current given Left peri-mandibular abscess   Clinical suspicion for C. difficile given new abdominal pain with diarrhea in the setting of recent clindamycin use.  Will discuss clinical course with infectious disease to determine need for testing of C. difficile.  In the interim will defer treatment of diarrhea until C. difficile can be ruled out.   Nida Manfredi D. Harris, NP-C [place] [facility] Personal contact information can be found on Amion  If no contact or response made please call [phone] 12/18/2022, 5:01 PM

## 2022-12-18 NOTE — Progress Notes (Signed)
PCCM progress note  Discussed case with Dr. Jerolyn Center with infectious disease and decision made to continue current clinical course and defer C. difficile testing as patient has not developed fever and leukocytosis is improving.  If clinical condition changes low threshold to test for C. difficile and start therapy.  Annessa Satre D. Harris, NP-C [place] [facility] Personal contact information can be found on Amion  If no contact or response made please call [phone] 12/18/2022, 5:17 PM

## 2022-12-18 NOTE — Progress Notes (Signed)
Pt woke up concerned her penrose drain in her mouth had become dislodged during sleep. She stated she tasted blood and felt it moving around more in her mouth. RN assessed drain. Looks intact but notified ENT, Dr. Jenne Pane with concern.   Cranford Mon

## 2022-12-18 NOTE — Progress Notes (Signed)
Subjective: Feeling better today, still some pain in left neck but improved.  Objective: Vital signs in last 24 hours: Temp:  [97.5 F (36.4 C)-98.4 F (36.9 C)] 97.7 F (36.5 C) (08/18 1100) Pulse Rate:  [42-82] 42 (08/18 1000) Resp:  [11-27] 20 (08/18 1000) BP: (97-159)/(50-106) 116/65 (08/18 1000) SpO2:  [92 %-99 %] 94 % (08/18 1000) Weight:  [140.7 kg] 140.7 kg (08/18 0500) Wt Readings from Last 1 Encounters:  12/18/22 (!) 140.7 kg    Intake/Output from previous day: 08/17 0701 - 08/18 0700 In: 1064.6 [I.V.:539.3; IV Piggyback:525.2] Out: 514 [Urine:503; Stool:1; Blood:10] Intake/Output this shift: No intake/output data recorded.  General appearance: alert, cooperative, and no distress Throat: Penrose in left mouth as before Neck: left neck edema and tenderness improved since yesterday, still somewhat tender and edematous  Recent Labs    12/16/22 0247  WBC 15.2*  HGB 11.7*  HCT 37.1  PLT 223    Recent Labs    12/16/22 0247  NA 133*  K 4.5  CL 104  CO2 20*  GLUCOSE 274*  BUN 14  CREATININE 0.57  CALCIUM 8.4*    Medications: I have reviewed the patient's current medications.  Assessment/Plan: Left peri-mandibular abscess related to dental extraction s/p I&D  Improving following procedure.  Plan to leave Penrose drain in place another day.  OK to start diet.  Should be fine to move out of ICU.   LOS: 3 days   Christia Reading 12/18/2022, 11:35 AM

## 2022-12-18 NOTE — Progress Notes (Addendum)
eLink Physician-Brief Progress Note Patient Name: Kathleen Walsh DOB: 1981-04-25 MRN: [id number]   Date of Service  12/18/2022  HPI/Events of Note  42 year old woman with a history of obesity, diabetes mellitus, hypertension, antiphospholipid antibody and hypercoagulable state, OSA on CPAP.  Admitted for perimandibular abscess status post surgical I&D 8/17.  Ongoing sepsi  eICU Interventions  Renew cardiac monitoring.    2043 - 6 diarrhea episodes prior to ID evaluation earlier, additional 4 more liquid stools since that conversation.  For now, continue observation.  If there is a new fever, leukocytosis, or new abdominal symptoms will initiate C. difficile testing.   Intervention Category Minor Interventions: Clinical assessment - ordering diagnostic tests  Ger Nicks 12/18/2022, 7:49 PM

## 2022-12-18 NOTE — Plan of Care (Signed)
  Problem: Education: Goal: Knowledge of General Education information will improve Description: Including pain rating scale, medication(s)/side effects and non-pharmacologic comfort measures Outcome: Progressing   Problem: Health Behavior/Discharge Planning: Goal: Ability to manage health-related needs will improve Outcome: Progressing   Problem: Clinical Measurements: Goal: Ability to maintain clinical measurements within normal limits will improve Outcome: Progressing Goal: Will remain free from infection Outcome: Progressing Goal: Diagnostic test results will improve Outcome: Progressing Goal: Respiratory complications will improve Outcome: Progressing Goal: Cardiovascular complication will be avoided Outcome: Progressing   Problem: Activity: Goal: Risk for activity intolerance will decrease Outcome: Progressing   Problem: Nutrition: Goal: Adequate nutrition will be maintained Outcome: Progressing   Problem: Coping: Goal: Level of anxiety will decrease Outcome: Progressing   Problem: Pain Managment: Goal: General experience of comfort will improve Outcome: Progressing   Problem: Safety: Goal: Ability to remain free from injury will improve Outcome: Progressing   Problem: Skin Integrity: Goal: Risk for impaired skin integrity will decrease Outcome: Progressing   Problem: Education: Goal: Ability to describe self-care measures that may prevent or decrease complications (Diabetes Survival Skills Education) will improve Outcome: Progressing Goal: Individualized Educational Video(s) Outcome: Progressing   Problem: Coping: Goal: Ability to adjust to condition or change in health will improve Outcome: Progressing   Problem: Fluid Volume: Goal: Ability to maintain a balanced intake and output will improve Outcome: Progressing   Problem: Health Behavior/Discharge Planning: Goal: Ability to identify and utilize available resources and services will  improve Outcome: Progressing Goal: Ability to manage health-related needs will improve Outcome: Progressing   Problem: Metabolic: Goal: Ability to maintain appropriate glucose levels will improve Outcome: Progressing   Problem: Nutritional: Goal: Maintenance of adequate nutrition will improve Outcome: Progressing Goal: Progress toward achieving an optimal weight will improve Outcome: Progressing   Problem: Skin Integrity: Goal: Risk for impaired skin integrity will decrease Outcome: Progressing   Problem: Tissue Perfusion: Goal: Adequacy of tissue perfusion will improve Outcome: Progressing   

## 2022-12-19 DIAGNOSIS — K047 Periapical abscess without sinus: Secondary | ICD-10-CM | POA: Diagnosis not present

## 2022-12-19 LAB — GLUCOSE, CAPILLARY
Glucose-Capillary: 88 mg/dL (ref 70–99)
Glucose-Capillary: 103 mg/dL — ABNORMAL HIGH (ref 70–99)
Glucose-Capillary: 151 mg/dL — ABNORMAL HIGH (ref 70–99)
Glucose-Capillary: 171 mg/dL — ABNORMAL HIGH (ref 70–99)

## 2022-12-19 MED ORDER — ENOXAPARIN SODIUM 40 MG/0.4ML IJ SOSY
40.0000 mg | PREFILLED_SYRINGE | INTRAMUSCULAR | Status: DC
  Administered 2022-12-19 – 2022-12-20 (×2): 40 mg via SUBCUTANEOUS
  Filled 2022-12-19 (×2): qty 0.4

## 2022-12-19 MED ORDER — INSULIN GLARGINE-YFGN 100 UNIT/ML ~~LOC~~ SOLN
20.0000 [IU] | Freq: Two times a day (BID) | SUBCUTANEOUS | Status: DC
  Administered 2022-12-19 – 2022-12-21 (×5): 20 [IU] via SUBCUTANEOUS
  Filled 2022-12-19 (×8): qty 0.2

## 2022-12-19 MED ORDER — SODIUM CHLORIDE 0.9 % IV SOLN: INTRAVENOUS | Status: DC | PRN

## 2022-12-19 NOTE — Progress Notes (Addendum)
NAME:  Kathleen Walsh, MRN:  [id number], DOB:  [birth year], LOS: 4 ADMISSION DATE:  12/14/2022, CONSULTATION DATE:  8/15 REFERRING MD:  PA Faulkner-ED, CHIEF COMPLAINT:  airway edema   History of Present Illness:  Mrs. Kathleen Walsh is a 42 y/o woman with a history of DM who presented with swelling in the throat and developing SOB on the left side after getting a tooth pulled 6 days prior to presentation. She has had teeth pulled previously uneventfully, but this time began getting fullness in her throat and below her jaw on the left side. She is not on immunosuppressive medications. She has a history of lupus antiphospholipid antibody syndrome but is not on [facility]; she has only had clots and required AC during pregnancies. She has improved since coming to the ED- she does not feel that her throat is as swollen. She can swallow her secretions and has been able to talk. She has pain with swallowing. She does not feel SOB. ENT has been consulted. In the ED she received zosyn & dexamethasone.   Pertinent  Medical History  OSA HTN DM HLD History of lupus antiphospholipid antibody syndrome anemia  Significant Hospital Events: Including procedures, antibiotic start and stop dates in addition to other pertinent events   8/15 admitted, started on Vanc/Zosyn, received dexamethasone  8/16 switched to Unasyn, transferred to floor, TRH to assume care 8/17 8/17 back to ICU after sensation of increased throat edema, throat closing, dyspnea 8/18 underwent I&D of left mandible abscess with Dr. Jenne Pane ENT, Penrose drain placed  Interim History / Subjective:  Breathing better, feels her swelling has gone down. Drain still in place. Reports her diarrhea and abd pain has improved  Objective   Blood pressure (!) 144/87, pulse (!) 54, temperature 98.2 F (36.8 C), temperature source Oral, resp. rate 18, height 5\' 6"  (1.676 m), weight 133.2 kg, last menstrual period 08/31/2015, SpO2 96%.        Intake/Output Summary  (Last 24 hours) at 12/19/2022 0942 Last data filed at 12/19/2022 0800 Gross per 24 hour  Intake 1635.57 ml  Output 600 ml  Net 1035.57 ml   Filed Weights   12/17/22 0408 12/18/22 0500 12/19/22 0353  Weight: 133.9 kg (!) 140.7 kg 133.2 kg    Examination: General: NAD, awake, alert HEENT: Penrose drain to left lower mouth, some swelling and erythema to L neck/jaw appears improved from prior exam Neuro: Alert, nonfocal CV: Mildly bradycardic, regular rhythm, no mrg PULM: CTAB normal WOB on RA GI: soft, NT/ND Extremities: warm/dry, no significant edema  Skin: no rashes or lesions  Ancillary tests personally reviewed:     Assessment & Plan:   Perimandibular infection, possibly abscess, after having tooth extracted Upper airway edema and possible airway compromise -8/18 underwent I&D of left mandible abscess with Dr. Jenne Pane ENT, Penrose drain placed P: Continue ICU level care given recent procedure, likely will be stable for floor tomorrow 8/20 S/p Dexamethasone  Continue IV Unasyn until able to tolerate PO abx Penrose drain in place, appreciate ENT eval Ensure adequate pain control-as needed Tylenol, ibuprofen, oxycodone Resumption of oral diet per ID  Poorly controlled DM type 2 -Worsened with use of steroid, CBG now stable/improving off dexamethasone -AM CBG 88 today P: Continue resistant scale SSI Decrease Semglee to 20 units twice daily Continue at bedtime SSI Hold Farxiga   OSA P: Continue CPAP nightly  HTN -Stable P: Resumed home HCTZ and lisinopril  As needed IV antihypertensives while n.p.o.  Obesity P: Discussed benefits  of weight loss  HLD P: Continue home statin   Best Practice (right click and "Reselect all SmartList Selections" daily)   Diet/type: Regular consistency (see orders) DVT prophylaxis: LMWH GI prophylaxis: N/A Lines: N/A Foley:  N/A Code Status:  full code Last date of multidisciplinary goals of care discussion [ Patient was  updated at the bedside.]  Vonna Drafts, MD 12/19/2022, 9:42 AM

## 2022-12-19 NOTE — TOC Progression Note (Addendum)
Transition of Care [facility]) - Progression Note    Patient Details  Name: TONGA DOMENICK MRN: [id number] Date of Birth: 1981-01-16  Transition of Care [facility] CM/SW Contact  Tom-Johnson, Hershal Coria, RN Phone Number: 12/19/2022, 12:33 PM  Clinical Narrative:     Patient underwent I&D of Left Mandible Abscess with Penrose drain placement  12/17/22. Continues on IV abx. ENT following.   CM will continue to follow as patient progresses with care towards discharge.       Expected Discharge Plan and Services                                               Social Determinants of Health (SDOH) Interventions SDOH Screenings   Tobacco Use: Medium Risk (12/17/2022)    Readmission Risk Interventions    12/16/2022    1:42 PM  Readmission Risk Prevention Plan  Transportation Screening Complete  PCP or Specialist Appt within 5-7 Days Complete  Home Care Screening Complete  Medication Review (RN CM) Referral to Pharmacy

## 2022-12-19 NOTE — Progress Notes (Signed)
Subjective: Feeling better day by day.  Still hurts to swallow.  Objective: Vital signs in last 24 hours: Temp:  [97.1 F (36.2 C)-98.3 F (36.8 C)] 98 F (36.7 C) (08/19 1131) Pulse Rate:  [43-65] 55 (08/19 1200) Resp:  [12-27] 18 (08/19 1200) BP: (87-144)/(50-89) 124/75 (08/19 1200) SpO2:  [89 %-97 %] 89 % (08/19 1200) Weight:  [133.2 kg] 133.2 kg (08/19 0353) Wt Readings from Last 1 Encounters:  12/19/22 133.2 kg    Intake/Output from previous day: 08/18 0701 - 08/19 0700 In: 1515.6 [P.O.:1080; IV Piggyback:435.6] Out: 600 [Urine:600] Intake/Output this shift: Total I/O In: 520 [P.O.:420; IV Piggyback:100] Out: -   General appearance: alert, cooperative, and no distress Neck: left upper neck tenderness and edema improving gradually  Recent Labs    12/18/22 1127  WBC 10.9*  HGB 12.0  HCT 38.2  PLT 234    Recent Labs    12/18/22 1127  NA 140  K 3.9  CL 105  CO2 24  GLUCOSE 239*  BUN 13  CREATININE 0.67  CALCIUM 8.7*    Medications: I have reviewed the patient's current medications.  Assessment/Plan: Left peri-mandibular abscess s/p I&D  Improving.  Will leave drain until tomorrow.  OK to move to regular room when felt stable.   LOS: 4 days   Christia Reading 12/19/2022, 2:11 PM

## 2022-12-20 DIAGNOSIS — J36 Peritonsillar abscess: Secondary | ICD-10-CM | POA: Diagnosis not present

## 2022-12-20 DIAGNOSIS — K047 Periapical abscess without sinus: Secondary | ICD-10-CM | POA: Diagnosis not present

## 2022-12-20 LAB — CULTURE, BLOOD (ROUTINE X 2)
Culture: NO GROWTH
Culture: NO GROWTH
Special Requests: ADEQUATE
Special Requests: ADEQUATE

## 2022-12-20 LAB — GLUCOSE, CAPILLARY
Glucose-Capillary: 115 mg/dL — ABNORMAL HIGH (ref 70–99)
Glucose-Capillary: 191 mg/dL — ABNORMAL HIGH (ref 70–99)
Glucose-Capillary: 130 mg/dL — ABNORMAL HIGH (ref 70–99)
Glucose-Capillary: 260 mg/dL — ABNORMAL HIGH (ref 70–99)

## 2022-12-20 LAB — AEROBIC/ANAEROBIC CULTURE W GRAM STAIN (SURGICAL/DEEP WOUND)

## 2022-12-20 NOTE — Progress Notes (Signed)
    Durable Medical Equipment  (From admission, onward)           Start     Ordered   12/20/22 1608  For home use only DME Walker rolling  Once       Question Answer Comment  Walker: With 5 Inch Wheels   Patient needs a walker to treat with the following condition Gait instability      12/20/22 1608

## 2022-12-20 NOTE — Progress Notes (Signed)
PROGRESS NOTE    Kathleen Walsh  JYN:[id number] DOB: Jun 13, 1980 DOA: 12/14/2022 PCP: Practice, [facility]    Brief Narrative:  Kathleen Walsh is a 42 years old woman with a history of diabetes mellitus, depression, hyperlipidemia, hypertension, obesity, sleep apnea presented to hospital with swelling and pain in the throat with shortness of breath after tooth extraction 6 days prior to presentation.  Patient with history of lupus antiphospholipid antibody syndrome but is not on [facility]; she has only had clots and required AC during pregnancies.  ENT consulted patient was then admitted to the hospital initially in ICU setting.  Sequence of events.  8/15 admitted, started on Vanc/Zosyn, received dexamethasone  8/16 switched to Unasyn, transferred to floor, TRH to assume care 8/17 8/17 back to ICU after sensation of increased throat edema, throat closing, dyspnea 8/18 underwent I&D of left mandible abscess with Dr. Jenne Pane ENT, Penrose drain placed 8/20.  On IV antibiotic.  Penrose drain removed.  Assessment plan.  Perimandibular infection, post tooth extraction.   Upper airway edema and possible airway compromise -8/18 underwent I&D of left mandible abscess with Dr. Jenne Pane ENT, Penrose drain placed.  On dexamethasone IV Unasyn.  Pain control. Oral diet as per ENT.  Penrose drain has been removed by ENT today.   Poorly controlled DM type 2 On steroids.  Will continue resistant scale sliding scale insulin, Semglee. Hold Farxiga    Obstructive sleep apnea. Continue CPAP nightly   Essential hypertension Continue HCTZ and lisinopril. As needed IV antihypertensives while n.p.o.   Obesity Patient would benefit from weight loss as outpatient.   Hyperlipidemia Continue statin       DVT prophylaxis: enoxaparin (LOVENOX) injection 40 mg Start: 12/19/22 1400 SCDs Start: 12/15/22 8657   Code Status:     Code Status: Full Code  Disposition: Home likely 12/21/2022  Status is:  Inpatient  Remains inpatient appropriate because: IV antibiotic, pending clinical improvement   Family Communication: Spoke with the patient's spouse at bedside.  Consultants:  ENT  Procedures:  Left perimandibular abscess status post I&D with drain placement.  Antimicrobials:  Unasyn IV  Anti-infectives (From admission, onward)    Start     Dose/Rate Route Frequency Ordered Stop   12/16/22 1030  Ampicillin-Sulbactam (UNASYN) 3 g in sodium chloride 0.9 % 100 mL IVPB        3 g 200 mL/hr over 30 Minutes Intravenous Every 6 hours 12/16/22 0932     12/15/22 1400  piperacillin-tazobactam (ZOSYN) IVPB 3.375 g  Status:  Discontinued        3.375 g 12.5 mL/hr over 240 Minutes Intravenous Every 8 hours 12/15/22 0706 12/16/22 0932   12/15/22 0800  vancomycin (VANCOREADY) IVPB 1500 mg/300 mL  Status:  Discontinued        1,500 mg 150 mL/hr over 120 Minutes Intravenous Every 12 hours 12/15/22 0705 12/15/22 0744   12/15/22 0800  vancomycin (VANCOREADY) IVPB 1250 mg/250 mL  Status:  Discontinued        1,250 mg 166.7 mL/hr over 90 Minutes Intravenous Every 12 hours 12/15/22 0744 12/16/22 0932   12/15/22 0445  piperacillin-tazobactam (ZOSYN) IVPB 3.375 g        3.375 g 100 mL/hr over 30 Minutes Intravenous  Once 12/15/22 0430 12/15/22 0556      Subjective: Today, patient was seen and examined at bedside.  Complains of constant pain on the left and neck especially on eating.  No fever chills or rigor.  Patient's family at bedside.  Has Penrose drain in place.  Objective: Vitals:   12/19/22 1819 12/19/22 2149 12/20/22 0638 12/20/22 0852  BP: 122/70 124/79 (!) 119/59 137/65  Pulse: 81 70 68 78  Resp: 20 18 18 20   Temp: 98.1 F (36.7 C) 97.9 F (36.6 C) 97.8 F (36.6 C) 98.2 F (36.8 C)  TempSrc: Oral Oral Oral Oral  SpO2: 93% 97% 94% 96%  Weight:      Height:        Intake/Output Summary (Last 24 hours) at 12/20/2022 1258 Last data filed at 12/20/2022 0907 Gross per 24 hour   Intake 812.51 ml  Output 1300 ml  Net -487.49 ml   Filed Weights   12/17/22 0408 12/18/22 0500 12/19/22 0353  Weight: 133.9 kg (!) 140.7 kg 133.2 kg    Physical Examination: Body mass index is 47.4 kg/m.  General: Obese built, not in obvious distress HENT:   No scleral pallor or icterus noted. Oral mucosa is moist.  Induration over the left submandibular region with surgical incision site, status post Penrose drain removal. Chest:  Clear breath sounds.  Diminished breath sounds bilaterally. No crackles or wheezes.  CVS: S1 &S2 heard. No murmur.  Regular rate and rhythm. Abdomen: Soft, nontender, nondistended.  Bowel sounds are heard.   Extremities: No cyanosis, clubbing or edema.  Peripheral pulses are palpable. Psych: Alert, awake and oriented, normal mood CNS:  No cranial nerve deficits.  Power equal in all extremities.   Skin: Warm and dry.  No rashes noted.  Data Reviewed:   CBC: Recent Labs  Lab 12/15/22 0208 12/15/22 0854 12/16/22 0247 12/18/22 1127  WBC 15.2* 19.0* 15.2* 10.9*  NEUTROABS 14.0*  --   --   --   HGB 14.1 13.2 11.7* 12.0  HCT 44.4 42.0 37.1 38.2  MCV 83.6 80.9 80.5 81.3  PLT 243 234 223 234    Basic Metabolic Panel: Recent Labs  Lab 12/15/22 0208 12/15/22 0854 12/16/22 0247 12/18/22 1127  NA 136  --  133* 140  K 3.9  --  4.5 3.9  CL 104  --  104 105  CO2 22  --  20* 24  GLUCOSE 219*  --  274* 239*  BUN 12  --  14 13  CREATININE 0.70 0.68 0.57 0.67  CALCIUM 8.6*  --  8.4* 8.7*  MG  --   --  2.1 2.2  PHOS  --   --  3.1 3.0    Liver Function Tests: No results for input(s): "AST", "ALT", "ALKPHOS", "BILITOT", "PROT", "ALBUMIN" in the last 168 hours.   Radiology Studies: No results found.    LOS: 5 days    Joycelyn Das, MD [facility] Available via Epic secure chat 7am-7pm After these hours, please refer to coverage provider listed on [email or web address] 12/20/2022, 12:58 PM

## 2022-12-20 NOTE — Plan of Care (Signed)
  Problem: Education: Goal: Knowledge of General Education information will improve Description: Including pain rating scale, medication(s)/side effects and non-pharmacologic comfort measures Outcome: Completed/Met

## 2022-12-20 NOTE — Plan of Care (Signed)
  Problem: Clinical Measurements: Goal: Will remain free from infection Outcome: Progressing   Problem: Activity: Goal: Risk for activity intolerance will decrease Outcome: Progressing   Problem: Nutrition: Goal: Adequate nutrition will be maintained Outcome: Progressing   Problem: Pain Managment: Goal: General experience of comfort will improve Outcome: Progressing   Problem: Education: Goal: Ability to describe self-care measures that may prevent or decrease complications (Diabetes Survival Skills Education) will improve Outcome: Progressing   Problem: Tissue Perfusion: Goal: Adequacy of tissue perfusion will improve Outcome: Progressing

## 2022-12-20 NOTE — Progress Notes (Signed)
Subjective: Still having pain but able to eat.  Objective: Vital signs in last 24 hours: Temp:  [97.5 F (36.4 C)-98.2 F (36.8 C)] 98.2 F (36.8 C) (08/20 0852) Pulse Rate:  [59-81] 78 (08/20 0852) Resp:  [15-24] 20 (08/20 0852) BP: (119-137)/(59-79) 137/65 (08/20 0852) SpO2:  [92 %-97 %] 96 % (08/20 0852) Wt Readings from Last 1 Encounters:  12/19/22 133.2 kg    Intake/Output from previous day: 08/19 0701 - 08/20 0700 In: 852.5 [P.O.:540; I.V.:12.5; IV Piggyback:300] Out: 500 [Urine:500] Intake/Output this shift: Total I/O In: 240 [P.O.:240] Out: 800 [Urine:800]  General appearance: alert, cooperative, and no distress Throat: Penrose drain removed Neck: left neck tenderness and edema improving  Recent Labs    12/18/22 1127  WBC 10.9*  HGB 12.0  HCT 38.2  PLT 234    Recent Labs    12/18/22 1127  NA 140  K 3.9  CL 105  CO2 24  GLUCOSE 239*  BUN 13  CREATININE 0.67  CALCIUM 8.7*    Medications: I have reviewed the patient's current medications.  Assessment/Plan: Left peri-mandibular abscess s/p I&D  Penrose drain removed.  Convert to oral antibiotic when appropriate and discharge when stable.  She can follow-up with her dental professional.   LOS: 5 days   Christia Reading 12/20/2022, 12:32 PM

## 2022-12-20 NOTE — TOC Progression Note (Signed)
Transition of Care [facility]) - Progression Note    Patient Details  Name: Kathleen Walsh MRN: [id number] Date of Birth: 03-Mar-1981  Transition of [facility]) CM/SW Contact  Tom-Johnson, Hershal Coria, RN Phone Number: 12/20/2022, 4:09 PM  Clinical Narrative:     Penrose drain removed today. Continues on IV abx. ENT following.  RW ordered from [employer] and Earna Coder to deliver to patient at bedside.  CM will continue to follow as patient progresses with care towards discharge.      Expected Discharge Plan and Services                                               Social Determinants of Health (SDOH) Interventions SDOH Screenings   Tobacco Use: Medium Risk (12/17/2022)    Readmission Risk Interventions    12/16/2022    1:42 PM  Readmission Risk Prevention Plan  Transportation Screening Complete  PCP or Specialist Appt within 5-7 Days Complete  Home Care Screening Complete  Medication Review (RN CM) Referral to Pharmacy

## 2022-12-20 NOTE — Hospital Course (Addendum)
Kathleen Walsh is a 42 years old woman with a history of diabetes mellitus, depression, hyperlipidemia, hypertension, obesity, sleep apnea presented to hospital with swelling and pain in the throat with shortness of breath after tooth extraction 6 days prior to presentation.  Patient with history of lupus antiphospholipid antibody syndrome but is not on [facility]; she has only had clots and required AC during pregnancies.  ENT consulted patient was then admitted to the hospital initially in ICU setting.  Sequence of events.  8/15 admitted, started on Vanc/Zosyn, received dexamethasone  8/16 switched to Unasyn, transferred to floor, TRH to assume care 8/17 8/17 back to ICU after sensation of increased throat edema, throat closing, dyspnea 8/18 underwent I&D of left mandible abscess with Dr. Jenne Pane ENT, Penrose drain placed  Assessment plan.  Perimandibular infection, post tooth extraction.   Upper airway edema and possible airway compromise -8/18 underwent I&D of left mandible abscess with Dr. Jenne Pane ENT, Penrose drain placed.  On dexamethasone IV Unasyn.  Pain control. Oral diet as per ENT.   Poorly controlled DM type 2 On steroids.  Will continue resistant scale sliding scale insulin, Semglee. Hold Farxiga    Obstructive sleep apnea. Continue CPAP nightly   Essential hypertension Continue HCTZ and lisinopril. As needed IV antihypertensives while n.p.o.   Obesity Patient would benefit from weight loss as outpatient.   Hyperlipidemia Continue statin

## 2022-12-21 DIAGNOSIS — E876 Hypokalemia: Secondary | ICD-10-CM | POA: Diagnosis not present

## 2022-12-21 DIAGNOSIS — K122 Cellulitis and abscess of mouth: Secondary | ICD-10-CM | POA: Diagnosis not present

## 2022-12-21 DIAGNOSIS — K047 Periapical abscess without sinus: Secondary | ICD-10-CM | POA: Diagnosis not present

## 2022-12-21 LAB — CBC
HCT: 44.5 % (ref 36.0–46.0)
Hemoglobin: 14.2 g/dL (ref 12.0–15.0)
MCH: 26 pg (ref 26.0–34.0)
MCHC: 31.9 g/dL (ref 30.0–36.0)
MCV: 81.5 fL (ref 80.0–100.0)
Platelets: 249 10*3/uL (ref 150–400)
RBC: 5.46 MIL/uL — ABNORMAL HIGH (ref 3.87–5.11)
RDW: 14.8 % (ref 11.5–15.5)
WBC: 11.6 10*3/uL — ABNORMAL HIGH (ref 4.0–10.5)
nRBC: 0 % (ref 0.0–0.2)

## 2022-12-21 LAB — GLUCOSE, CAPILLARY
Glucose-Capillary: 143 mg/dL — ABNORMAL HIGH (ref 70–99)
Glucose-Capillary: 171 mg/dL — ABNORMAL HIGH (ref 70–99)

## 2022-12-21 LAB — BASIC METABOLIC PANEL
Anion gap: 11 (ref 5–15)
BUN: 6 mg/dL (ref 6–20)
CO2: 29 mmol/L (ref 22–32)
Calcium: 8.6 mg/dL — ABNORMAL LOW (ref 8.9–10.3)
Chloride: 96 mmol/L — ABNORMAL LOW (ref 98–111)
Creatinine, Ser: 0.69 mg/dL (ref 0.44–1.00)
GFR, Estimated: >60 mL/min (ref >60)
Glucose, Bld: 143 mg/dL — ABNORMAL HIGH (ref 70–99)
Potassium: 2.8 mmol/L — ABNORMAL LOW (ref 3.5–5.1)
Sodium: 136 mmol/L (ref 135–145)

## 2022-12-21 LAB — MAGNESIUM: Magnesium: 2 mg/dL (ref 1.7–2.4)

## 2022-12-21 MED ORDER — AMOXICILLIN-POT CLAVULANATE 875-125 MG PO TABS: 1.0000 | ORAL_TABLET | Freq: Two times a day (BID) | ORAL | 0 refills | Status: DC

## 2022-12-21 MED ORDER — IBUPROFEN 600 MG PO TABS: 600.0000 mg | ORAL_TABLET | Freq: Four times a day (QID) | ORAL | 0 refills | Status: DC | PRN

## 2022-12-21 MED ORDER — POTASSIUM CHLORIDE 10 MEQ/100ML IV SOLN
10.0000 meq | INTRAVENOUS | Status: AC
  Administered 2022-12-21 (×4): 10 meq via INTRAVENOUS
  Filled 2022-12-21 (×4): qty 100

## 2022-12-21 MED ORDER — CHLORHEXIDINE GLUCONATE 0.12 % MT SOLN: 15.0000 mL | Freq: Two times a day (BID) | OROMUCOSAL | 0 refills | Status: DC

## 2022-12-21 MED ORDER — FAMOTIDINE 20 MG PO TABS: 20.0000 mg | ORAL_TABLET | Freq: Two times a day (BID) | ORAL | 0 refills | Status: DC

## 2022-12-21 MED ORDER — OXYCODONE HCL 5 MG PO TABS: 5.0000 mg | ORAL_TABLET | ORAL | 0 refills | Status: AC | PRN

## 2022-12-21 MED ORDER — POTASSIUM CHLORIDE CRYS ER 20 MEQ PO TBCR: 20.0000 meq | EXTENDED_RELEASE_TABLET | Freq: Every day | ORAL | 0 refills | Status: DC

## 2022-12-21 MED ORDER — POTASSIUM CHLORIDE 20 MEQ PO PACK
40.0000 meq | PACK | Freq: Once | ORAL | Status: AC
  Administered 2022-12-21: 40 meq via ORAL
  Filled 2022-12-21: qty 2

## 2022-12-21 MED ORDER — POTASSIUM CHLORIDE CRYS ER 20 MEQ PO TBCR
40.0000 meq | EXTENDED_RELEASE_TABLET | Freq: Once | ORAL | Status: DC
  Filled 2022-12-21: qty 2

## 2022-12-21 MED ORDER — OXYCODONE HCL 5 MG PO TABS: 5.0000 mg | ORAL_TABLET | ORAL | 0 refills | Status: DC | PRN

## 2022-12-21 NOTE — TOC Transition Note (Addendum)
Transition of Care [facility]) - CM/SW Discharge Note   Patient Details  Name: Kathleen Walsh MRN: [id number] Date of Birth: 1980/08/04  Transition of Care [facility]) CM/SW Contact:  Tom-Johnson, Hershal Coria, RN Phone Number: 12/21/2022, 11:04 AM   Clinical Narrative:     Patient is scheduled for discharge today.  Readmission Risk Assessment done. Hospital f/u and discharge instructions on AVS. RW delivered to patient at bedside by [employer]. Husband, Riley Lam to transport at discharge.  No further TOC needs noted.      Final next level of care: Home/Self Care Barriers to Discharge: Barriers Resolved   Patient Goals and CMS Choice CMS Medicare.gov Compare Post Acute Care list provided to:: Patient Choice offered to / list presented to : NA  Discharge Placement                  Patient to be transferred to facility by: Husband Name of family member notified: [facility]    Discharge Plan and Services Additional resources added to the After Visit Summary for                  DME Arranged: Walker rolling DME Agency: [employer] Date DME Agency Contacted: 12/20/22 Time DME Agency Contacted: [phone] Representative spoke with at DME Agency: Earna Coder HH Arranged: NA HH Agency: NA        Social Determinants of Health (SDOH) Interventions SDOH Screenings   Tobacco Use: Medium Risk (12/17/2022)     Readmission Risk Interventions    12/21/2022   11:01 AM 12/16/2022    1:42 PM  Readmission Risk Prevention Plan  Post Dischage Appt Complete   Medication Screening Complete   Transportation Screening Complete Complete  PCP or Specialist Appt within 5-7 Days  Complete  Home Care Screening  Complete  Medication Review (RN CM)  Referral to Pharmacy

## 2022-12-21 NOTE — Discharge Summary (Addendum)
Physician Discharge Summary  ONETHA RYHERD ZOX:[id number] DOB: November 11, 1980 DOA: 12/14/2022  PCP: [facility]  Admit date: 12/14/2022 Discharge date: 12/21/2022  Admitted From: Home  Discharge disposition: Home   Recommendations for Outpatient Follow-Up:   Follow up with your primary care provider in one week.  Check CBC, BMP, magnesium in the next visit Follow-up with oral surgeon as soon as possible for odontogenic infection. Follow-up with ENT Dr. Jenne Pane as needed. Discharge Diagnosis:   Principal Problem:   Submandibular abscess Active Problems:   Dental abscess Hypokalemia.  Discharge Condition: Improved.  Diet recommendation: Soft diet.  Wound care: None.  Code status: Full.   History of Present Illness:   Mrs. Tyson is a 42 years old woman with a history of diabetes mellitus, depression, hyperlipidemia, hypertension, obesity, sleep apnea presented to hospital with swelling and pain in the throat with shortness of breath after tooth extraction 6 days prior to presentation.  Patient with history of lupus antiphospholipid antibody syndrome but is not on [facility] she has only had clots and required AC during pregnancies.  ENT was consulted and patient was then admitted to the hospital initially in ICU setting.  Subsequently patient was transferred out of the ICU.  Hospital Course:   Following conditions were addressed during hospitalization as listed below,  Perimandibular infection, post tooth extraction.   Upper airway edema and possible airway compromise -8/18 underwent I&D of left mandible abscess with Dr. Jenne Pane ENT, Penrose drain placed.  Received dexamethasone, IV Unasyn.  At this time, patient has been seen by ENT and has been considered stable for disposition home with oral antibiotics.  Patient will need to follow-up with oral surgeon as outpatient.  poorly controlled DM type 2 Received steroids during hospitalization.  Will continue  antibiotics on discharge.  Continue insulin from home.  Advised about good glycemic control   Obstructive sleep apnea. Continue CPAP nightly   Essential hypertension Continue HCTZ and lisinopril on discharge..    Morbid obesity Body mass index is 47.4 kg/m. Patient would benefit from weight loss as outpatient.   Hyperlipidemia Continue statin on discharge.  Hypokalemia.  Patient has received 40 mEq of IV potassium and 40 orally.  Will continue oral potassium for next few days on discharge.  Disposition.  At this time, patient is stable for disposition home with outpatient PCP, ENT and dental surgeon as outpatient.  Medical Consultants:   ENT  Procedures:    Left perimandibular abscess status post I&D with drain placement on 12/18/2022 Subjective:   Today, patient was seen and examined at bedside.  Complains of mild pain in the left side of the neck.  Denies any nausea vomiting fever chills or rigor.  Patient's family at bedside.  Discharge Exam:   Vitals:   12/21/22 0600 12/21/22 0926  BP: (!) 112/93 123/81  Pulse: 75 77  Resp:  18  Temp: 98.2 F (36.8 C) 98.6 F (37 C)  SpO2: 92% 97%   Vitals:   12/20/22 1651 12/20/22 2029 12/21/22 0600 12/21/22 0926  BP: 123/62 120/72 (!) 112/93 123/81  Pulse: 71 83 75 77  Resp: 20 20  18   Temp: 98 F (36.7 C) 97.8 F (36.6 C) 98.2 F (36.8 C) 98.6 F (37 C)  TempSrc: Oral Oral Oral Oral  SpO2: 96% 91% 92% 97%  Weight:      Height:       Body mass index is 47.4 kg/m.   General: Alert awake, not in obvious distress, obese  built HENT: pupils equally reacting to light,  No scleral pallor or icterus noted. Oral mucosa is moist.  Induration noted over the left submandibular region Chest:  Clear breath sounds.  No crackles or wheezes.  CVS: S1 &S2 heard. No murmur.  Regular rate and rhythm. Abdomen: Soft, nontender, nondistended.  Bowel sounds are heard.   Extremities: No cyanosis, clubbing or edema.  Peripheral pulses are  palpable. Psych: Alert, awake and oriented, normal mood CNS:  No cranial nerve deficits.  Power equal in all extremities.   Skin: Warm and dry.  No rashes noted.  The results of significant diagnostics from this hospitalization (including imaging, microbiology, ancillary and laboratory) are listed below for reference.     Diagnostic Studies:   CT Soft Tissue Neck W Contrast  Result Date: 12/15/2022 CLINICAL DATA:  Initial evaluation for acute dental pain, recent tooth extraction. EXAM: CT NECK WITH CONTRAST TECHNIQUE: Multidetector CT imaging of the neck was performed using the standard protocol following the bolus administration of intravenous contrast. RADIATION DOSE REDUCTION: This exam was performed according to the departmental dose-optimization program which includes automated exposure control, adjustment of the mA and/or kV according to patient size and/or use of iterative reconstruction technique. CONTRAST:  75mL OMNIPAQUE IOHEXOL 350 MG/ML SOLN COMPARISON:  Prior CT from 12/11/2022. FINDINGS: Pharynx and larynx: Sequelae of recent mandibular molar tooth extraction on the left. There has been interval worsening in regional soft tissue swelling with inflammatory changes about the left mandible, with involvement of the left masticator, parapharyngeal, and submandibular spaces. Changes have progressed and worsened from prior. Phlegmonous soft tissue density adjacent to the left mandible and left submandibular gland without discrete abscess. Additionally, now seen is mucosal edema and swelling involving the left pharynx. Uvula is swollen. Epiglottis is edematous and swollen. Changes compatible with concomitant supraglottitis. Mass effect on the supraglottic airway which is narrowed, measuring 5 mm in transverse diameter at its most narrow point (series 3, image 51). Scattered stranding within the retropharyngeal space without loculated collection. Glottis is fairly symmetric and within normal limits  inferiorly. Subglottic airway patent clear. Salivary glands: Parotid and right submandibular glands remain within normal limits. Inflammatory changes involve and surround the left submandibular gland related to the adjacent inflammatory process within the left face. Thyroid: Normal. Lymph nodes: Few prominent left upper cervical lymph nodes are seen, largest of which measures 1.3 cm in short axis at level 2. Findings are presumably reactive. Vascular: Normal intravascular enhancement seen within the neck. Limited intracranial: Unremarkable. Visualized orbits: Not included on this exam. Mastoids and visualized paranasal sinuses: Visualized paranasal sinuses are clear. Mastoid air cells and middle ear cavities are well pneumatized and free of fluid. Skeleton: No discrete or worrisome osseous lesions. No significant spondylosis within the cervical spine for age. Upper chest: No acute finding. Other: None. IMPRESSION: 1. Sequelae of recent mandibular molar tooth extraction on the left. Interval worsening in regional soft tissue swelling with inflammatory changes, concerning for worsened infection/cellulitis. Phlegmonous soft tissue density adjacent to the left mandible and left submandibular gland without discrete abscess or drainable fluid collection. 2. New mucosal edema and swelling about the left pharynx and supraglottic larynx with involvement of the epiglottis. Findings consistent with concomitant supraglottitis. Close clinical monitoring recommended as this patient could potentially be a risk for developing airway compromise. 3. Mildly enlarged left upper cervical lymph nodes, presumably reactive. Electronically Signed   By: Rise Mu M.D.   On: 12/15/2022 04:10     Labs:   Basic  Metabolic Panel: Recent Labs  Lab 12/15/22 0208 12/15/22 0854 12/16/22 0247 12/18/22 1127 12/21/22 0209  NA 136  --  133* 140 136  K 3.9  --  4.5 3.9 2.8*  CL 104  --  104 105 96*  CO2 22  --  20* 24 29   GLUCOSE 219*  --  274* 239* 143*  BUN 12  --  14 13 6   CREATININE 0.70 0.68 0.57 0.67 0.69  CALCIUM 8.6*  --  8.4* 8.7* 8.6*  MG  --   --  2.1 2.2 2.0  PHOS  --   --  3.1 3.0  --    GFR Estimated Creatinine Clearance: 128.6 mL/min (by C-G formula based on SCr of 0.69 mg/dL). Liver Function Tests: No results for input(s): "AST", "ALT", "ALKPHOS", "BILITOT", "PROT", "ALBUMIN" in the last 168 hours. No results for input(s): "LIPASE", "AMYLASE" in the last 168 hours. No results for input(s): "AMMONIA" in the last 168 hours. Coagulation profile No results for input(s): "INR", "PROTIME" in the last 168 hours.  CBC: Recent Labs  Lab 12/15/22 0208 12/15/22 0854 12/16/22 0247 12/18/22 1127 12/21/22 0209  WBC 15.2* 19.0* 15.2* 10.9* 11.6*  NEUTROABS 14.0*  --   --   --   --   HGB 14.1 13.2 11.7* 12.0 14.2  HCT 44.4 42.0 37.1 38.2 44.5  MCV 83.6 80.9 80.5 81.3 81.5  PLT 243 234 223 234 249   Cardiac Enzymes: No results for input(s): "CKTOTAL", "CKMB", "CKMBINDEX", "TROPONINI" in the last 168 hours. BNP: Invalid input(s): "POCBNP" CBG: Recent Labs  Lab 12/20/22 1121 12/20/22 1627 12/20/22 2029 12/21/22 0718 12/21/22 1115  GLUCAP 191* 130* 260* 143* 171*   D-Dimer No results for input(s): "DDIMER" in the last 72 hours. Hgb A1c No results for input(s): "HGBA1C" in the last 72 hours. Lipid Profile No results for input(s): "CHOL", "HDL", "LDLCALC", "TRIG", "CHOLHDL", "LDLDIRECT" in the last 72 hours. Thyroid function studies No results for input(s): "TSH", "T4TOTAL", "T3FREE", "THYROIDAB" in the last 72 hours.  Invalid input(s): "FREET3" Anemia work up No results for input(s): "VITAMINB12", "FOLATE", "FERRITIN", "TIBC", "IRON", "RETICCTPCT" in the last 72 hours. Microbiology Recent Results (from the past 240 hour(s))  Blood culture (routine x 2)     Status: None   Collection Time: 12/15/22  4:57 AM   Specimen: BLOOD  Result Value Ref Range Status   Specimen  Description BLOOD RIGHT ANTECUBITAL  Final   Special Requests   Final    BOTTLES DRAWN AEROBIC AND ANAEROBIC Blood Culture adequate volume   Culture   Final    NO GROWTH 5 DAYS Performed at [facility], [address], [place], [place] [address]    Report Status 12/20/2022 FINAL  Final  Blood culture (routine x 2)     Status: None   Collection Time: 12/15/22  5:12 AM   Specimen: BLOOD RIGHT HAND  Result Value Ref Range Status   Specimen Description BLOOD RIGHT HAND  Final   Special Requests   Final    BOTTLES DRAWN AEROBIC AND ANAEROBIC Blood Culture adequate volume   Culture   Final    NO GROWTH 5 DAYS Performed at [facility], [address] [place], [place] [address]    Report Status 12/20/2022 FINAL  Final  MRSA Next Gen by PCR, Nasal     Status: None   Collection Time: 12/15/22  8:07 AM   Specimen: Nasal Mucosa; Nasal Swab  Result Value Ref Range Status  MRSA by PCR Next Gen NOT DETECTED NOT DETECTED Final    Comment: (NOTE) The GeneXpert MRSA Assay (FDA approved for NASAL specimens only), is one component of a comprehensive MRSA colonization surveillance program. It is not intended to diagnose MRSA infection nor to guide or monitor treatment for MRSA infections. Test performance is not FDA approved in patients less than 73 years old. Performed at [facility], [address] [place], [place] [address]   Aerobic/Anaerobic Culture w Gram Stain (surgical/deep wound)     Status: None   Collection Time: 12/17/22  5:10 PM   Specimen: Abscess  Result Value Ref Range Status   Specimen Description ABSCESS  Final   Special Requests LEFT MANDIBLE  Final   Gram Stain   Final    ABUNDANT WBC PRESENT, PREDOMINANTLY PMN MODERATE GRAM NEGATIVE RODS    Culture   Final    FEW PREVOTELLA MELANINOGENICA BETA LACTAMASE POSITIVE RARE EIKENELLA CORRODENS Usually susceptible to penicillin and other beta lactam agents,quinolones,macrolides and  tetracyclines. Performed at [facility], [address], [place], [place] [address]    Report Status 12/20/2022 FINAL  Final     Discharge Instructions:   Discharge Instructions     Call MD for:  persistant nausea and vomiting   Complete by: As directed    Call MD for:  severe uncontrolled pain   Complete by: As directed    Call MD for:  temperature >100.4   Complete by: As directed    Diet Carb Modified   Complete by: As directed    Discharge instructions   Complete by: As directed    Follow-up with your primary care provider in 1 week.  Check blood work at that time.  Continue to take antibiotics as prescribed.  Follow-up with your dentist as soon as possible.  Seek medical attention for worsening symptoms. Take nausea medication prior to antibiotic if needed.   Increase activity slowly   Complete by: As directed       Allergies as of 12/21/2022       Reactions   Amoxicillin Nausea And Vomiting   Has patient had a PCN reaction causing immediate rash, facial/tongue/throat swelling, SOB or lightheadedness with hypotension: No Has patient had a PCN reaction causing severe rash involving mucus membranes or skin necrosis: No Has patient had a PCN reaction that required hospitalization: No Has patient had a PCN reaction occurring within the last 10 years: Yes If all of the above answers are "NO", then may proceed with Cephalosporin use.   Metformin And Related Nausea And Vomiting   Ozempic (0.25 Or 0.5 Mg-dose) [semaglutide(0.25 Or 0.5mg -dos)]    Trulicity [dulaglutide] Nausea And Vomiting        Medication List     STOP taking these medications    clindamycin 300 MG capsule Commonly known as: CLEOCIN       TAKE these medications    amoxicillin-clavulanate 875-125 MG tablet Commonly known as: AUGMENTIN Take 1 tablet by mouth 2 (two) times daily.   chlorhexidine 0.12 % solution Commonly known as: PERIDEX Use as directed 15 mLs in the mouth or throat 2  (two) times daily.   dapagliflozin propanediol 10 MG Tabs tablet Commonly known as: FARXIGA Take 10 mg by mouth daily.   famotidine 20 MG tablet Commonly known as: PEPCID Take 1 tablet (20 mg total) by mouth 2 (two) times daily for 14 days.   GAS-X PO Take 1 tablet by mouth daily as needed (gas).  hydrochlorothiazide 25 MG tablet Commonly known as: HYDRODIURIL Take 25 mg by mouth daily.   HYDROcodone-acetaminophen 5-325 MG tablet Commonly known as: NORCO/VICODIN Take 1 tablet by mouth every 6 (six) hours as needed.   ibuprofen 600 MG tablet Commonly known as: ADVIL Take 1 tablet (600 mg total) by mouth every 6 (six) hours as needed for headache, mild pain or moderate pain.   Lantus SoloStar 100 UNIT/ML Solostar Pen Generic drug: insulin glargine Inject 24-60 Units into the skin daily. 24 units AM, 60 units PM   levocetirizine 5 MG tablet Commonly known as: XYZAL Take 5 mg by mouth daily as needed for allergies.   lisinopril 20 MG tablet Commonly known as: ZESTRIL Take 20 mg by mouth daily.   methocarbamol 500 MG tablet Commonly known as: ROBAXIN Take 500 mg by mouth daily as needed for muscle spasms.   NovoLOG FlexPen 100 UNIT/ML FlexPen Generic drug: insulin aspart Inject 24-30 Units into the skin 3 (three) times daily before meals.   ondansetron 8 MG tablet Commonly known as: ZOFRAN TAKE 1 TABLET (8 MG TOTAL) BY MOUTH 3 (THREE) TIMES DAILY AS NEEDED FOR NAUSEA OR VOMITING.   potassium chloride SA 20 MEQ tablet Commonly known as: KLOR-CON M Take 1 tablet (20 mEq total) by mouth daily.   rizatriptan 10 MG disintegrating tablet Commonly known as: MAXALT-MLT Take 10 mg by mouth as needed for migraine.   rosuvastatin 20 MG tablet Commonly known as: CRESTOR Take 20 mg by mouth daily.               Durable Medical Equipment  (From admission, onward)           Start     Ordered   12/20/22 1608  For home use only DME Walker rolling  Once        Question Answer Comment  Walker: With 5 Inch Wheels   Patient needs a walker to treat with the following condition Gait instability      12/20/22 1608            Follow-up Information     Christia Reading, MD. Call.   Specialty: Otolaryngology Why: As needed Contact information: [address] [place] [address] [phone]                  Time coordinating discharge: 39 minutes  Signed:  Quantavia Frith  [facility] 12/21/2022, 1:13 PM

## 2022-12-27 ENCOUNTER — Other Ambulatory Visit (HOSPITAL_COMMUNITY): Payer: Self-pay | Admitting: Otolaryngology

## 2022-12-27 DIAGNOSIS — K122 Cellulitis and abscess of mouth: Secondary | ICD-10-CM

## 2022-12-28 ENCOUNTER — Encounter (HOSPITAL_COMMUNITY): Payer: Self-pay

## 2022-12-28 ENCOUNTER — Ambulatory Visit (HOSPITAL_COMMUNITY)
Admission: RE | Admit: 2022-12-28 | Discharge: 2022-12-28 | Disposition: A | Discharge status: Home / Self Care | Payer: Medicaid Other | Source: Ambulatory Visit | Attending: Otolaryngology | Admitting: Otolaryngology

## 2022-12-28 DIAGNOSIS — K122 Cellulitis and abscess of mouth: Secondary | ICD-10-CM | POA: Diagnosis present

## 2022-12-28 MED ORDER — SODIUM CHLORIDE (PF) 0.9 % IJ SOLN
INTRAMUSCULAR | Status: AC
  Filled 2022-12-28: qty 50

## 2022-12-28 MED ORDER — IOHEXOL 300 MG/ML  SOLN
75.0000 mL | Freq: Once | INTRAMUSCULAR | Status: AC | PRN
  Administered 2022-12-28: 75 mL via INTRAVENOUS

## 2022-12-29 ENCOUNTER — Ambulatory Visit (HOSPITAL_COMMUNITY): Payer: Medicaid Other

## 2023-03-02 ENCOUNTER — Encounter (HOSPITAL_COMMUNITY): Payer: Self-pay | Admitting: Emergency Medicine

## 2023-03-02 ENCOUNTER — Other Ambulatory Visit: Payer: Self-pay

## 2023-03-02 ENCOUNTER — Inpatient Hospital Stay (HOSPITAL_COMMUNITY)
Admission: EM | Admit: 2023-03-02 | Discharge: 2023-03-07 | DRG: 291 | Disposition: A | Discharge status: Home / Self Care | Payer: Medicaid Other | Attending: Internal Medicine | Admitting: Internal Medicine

## 2023-03-02 ENCOUNTER — Emergency Department (HOSPITAL_COMMUNITY): Payer: Medicaid Other

## 2023-03-02 DIAGNOSIS — Z79899 Other long term (current) drug therapy: Secondary | ICD-10-CM

## 2023-03-02 DIAGNOSIS — I5033 Acute on chronic diastolic (congestive) heart failure: Secondary | ICD-10-CM | POA: Diagnosis present

## 2023-03-02 DIAGNOSIS — E785 Hyperlipidemia, unspecified: Secondary | ICD-10-CM | POA: Diagnosis present

## 2023-03-02 DIAGNOSIS — Z6841 Body Mass Index (BMI) 40.0 and over, adult: Secondary | ICD-10-CM

## 2023-03-02 DIAGNOSIS — Z8249 Family history of ischemic heart disease and other diseases of the circulatory system: Secondary | ICD-10-CM | POA: Diagnosis not present

## 2023-03-02 DIAGNOSIS — Z794 Long term (current) use of insulin: Secondary | ICD-10-CM

## 2023-03-02 DIAGNOSIS — I110 Hypertensive heart disease with heart failure: Secondary | ICD-10-CM | POA: Diagnosis not present

## 2023-03-02 DIAGNOSIS — I509 Heart failure, unspecified: Secondary | ICD-10-CM | POA: Diagnosis present

## 2023-03-02 DIAGNOSIS — Z713 Dietary counseling and surveillance: Secondary | ICD-10-CM

## 2023-03-02 DIAGNOSIS — G43909 Migraine, unspecified, not intractable, without status migrainosus: Secondary | ICD-10-CM | POA: Diagnosis not present

## 2023-03-02 DIAGNOSIS — I5021 Acute systolic (congestive) heart failure: Secondary | ICD-10-CM | POA: Diagnosis not present

## 2023-03-02 DIAGNOSIS — Z87891 Personal history of nicotine dependence: Secondary | ICD-10-CM | POA: Diagnosis not present

## 2023-03-02 DIAGNOSIS — Z881 Allergy status to other antibiotic agents status: Secondary | ICD-10-CM

## 2023-03-02 DIAGNOSIS — Z90710 Acquired absence of both cervix and uterus: Secondary | ICD-10-CM | POA: Diagnosis not present

## 2023-03-02 DIAGNOSIS — Z833 Family history of diabetes mellitus: Secondary | ICD-10-CM | POA: Diagnosis not present

## 2023-03-02 DIAGNOSIS — F32A Depression, unspecified: Secondary | ICD-10-CM | POA: Diagnosis present

## 2023-03-02 DIAGNOSIS — M5442 Lumbago with sciatica, left side: Secondary | ICD-10-CM | POA: Diagnosis present

## 2023-03-02 DIAGNOSIS — G4733 Obstructive sleep apnea (adult) (pediatric): Secondary | ICD-10-CM | POA: Diagnosis present

## 2023-03-02 DIAGNOSIS — Z888 Allergy status to other drugs, medicaments and biological substances status: Secondary | ICD-10-CM | POA: Diagnosis not present

## 2023-03-02 LAB — CBC
HCT: 43.9 % (ref 36.0–46.0)
HCT: 44.9 % (ref 36.0–46.0)
Hemoglobin: 13.3 g/dL (ref 12.0–15.0)
Hemoglobin: 14.1 g/dL (ref 12.0–15.0)
MCH: 25.8 pg — ABNORMAL LOW (ref 26.0–34.0)
MCH: 25.6 pg — ABNORMAL LOW (ref 26.0–34.0)
MCHC: 30.3 g/dL (ref 30.0–36.0)
MCHC: 31.4 g/dL (ref 30.0–36.0)
MCV: 85.2 fL (ref 80.0–100.0)
MCV: 81.6 fL (ref 80.0–100.0)
Platelets: 230 10*3/uL (ref 150–400)
Platelets: 274 10*3/uL (ref 150–400)
RBC: 5.15 MIL/uL — ABNORMAL HIGH (ref 3.87–5.11)
RBC: 5.5 MIL/uL — ABNORMAL HIGH (ref 3.87–5.11)
RDW: 16.2 % — ABNORMAL HIGH (ref 11.5–15.5)
RDW: 16.1 % — ABNORMAL HIGH (ref 11.5–15.5)
WBC: 13.5 10*3/uL — ABNORMAL HIGH (ref 4.0–10.5)
WBC: 13.8 10*3/uL — ABNORMAL HIGH (ref 4.0–10.5)
nRBC: 0 % (ref 0.0–0.2)
nRBC: 0 % (ref 0.0–0.2)

## 2023-03-02 LAB — GLUCOSE, CAPILLARY: Glucose-Capillary: 162 mg/dL — ABNORMAL HIGH (ref 70–99)

## 2023-03-02 LAB — HEMOGLOBIN A1C
Hgb A1c MFr Bld: 7.5 % — ABNORMAL HIGH (ref 4.8–5.6)
Mean Plasma Glucose: 168.55 mg/dL

## 2023-03-02 LAB — BASIC METABOLIC PANEL WITH GFR
Anion gap: 14 (ref 5–15)
BUN: 13 mg/dL (ref 6–20)
CO2: 18 mmol/L — ABNORMAL LOW (ref 22–32)
Calcium: 9.2 mg/dL (ref 8.9–10.3)
Chloride: 104 mmol/L (ref 98–111)
Creatinine, Ser: 0.56 mg/dL (ref 0.44–1.00)
GFR, Estimated: >60 mL/min (ref >60)
Glucose, Bld: 182 mg/dL — ABNORMAL HIGH (ref 70–99)
Potassium: 4.6 mmol/L (ref 3.5–5.1)
Sodium: 136 mmol/L (ref 135–145)

## 2023-03-02 LAB — TROPONIN I (HIGH SENSITIVITY)
Troponin I (High Sensitivity): 8 ng/L (ref <18)
Troponin I (High Sensitivity): 5 ng/L (ref <18)
Troponin I (High Sensitivity): 5 ng/L (ref <18)

## 2023-03-02 LAB — BRAIN NATRIURETIC PEPTIDE: B Natriuretic Peptide: 296.4 pg/mL — ABNORMAL HIGH (ref 0.0–100.0)

## 2023-03-02 LAB — CREATININE, SERUM
Creatinine, Ser: 0.7 mg/dL (ref 0.44–1.00)
GFR, Estimated: >60 mL/min (ref >60)

## 2023-03-02 LAB — D-DIMER, QUANTITATIVE: D-Dimer, Quant: <0.27 ug{FEU}/mL (ref 0.00–0.50)

## 2023-03-02 LAB — TSH: TSH: 3.311 u[IU]/mL (ref 0.350–4.500)

## 2023-03-02 MED ORDER — HYDROMORPHONE HCL 1 MG/ML IJ SOLN
0.5000 mg | Freq: Once | INTRAMUSCULAR | Status: AC
  Administered 2023-03-02: 0.5 mg via INTRAVENOUS
  Filled 2023-03-02: qty 0.5

## 2023-03-02 MED ORDER — NITROGLYCERIN 0.4 MG SL SUBL
0.4000 mg | SUBLINGUAL_TABLET | SUBLINGUAL | Status: DC | PRN
  Administered 2023-03-02: 0.4 mg via SUBLINGUAL
  Filled 2023-03-02: qty 1

## 2023-03-02 MED ORDER — FUROSEMIDE 10 MG/ML IJ SOLN
40.0000 mg | Freq: Once | INTRAMUSCULAR | Status: AC
  Administered 2023-03-02: 40 mg via INTRAVENOUS
  Filled 2023-03-02: qty 4

## 2023-03-02 MED ORDER — ENOXAPARIN SODIUM 80 MG/0.8ML IJ SOSY
80.0000 mg | PREFILLED_SYRINGE | INTRAMUSCULAR | Status: DC
Start: 2023-03-02 — End: 2023-03-07
  Administered 2023-03-02 – 2023-03-06 (×5): 80 mg via SUBCUTANEOUS
  Filled 2023-03-02 (×5): qty 0.8

## 2023-03-02 MED ORDER — ENOXAPARIN SODIUM 40 MG/0.4ML IJ SOSY: 40.0000 mg | PREFILLED_SYRINGE | INTRAMUSCULAR | Status: DC

## 2023-03-02 MED ORDER — INSULIN ASPART 100 UNIT/ML IJ SOLN
0.0000 [IU] | Freq: Every day | INTRAMUSCULAR | Status: DC
  Administered 2023-03-04: 2 [IU] via SUBCUTANEOUS

## 2023-03-02 MED ORDER — ACETAMINOPHEN 650 MG RE SUPP: 650.0000 mg | Freq: Four times a day (QID) | RECTAL | Status: DC | PRN

## 2023-03-02 MED ORDER — ONDANSETRON HCL 4 MG/2ML IJ SOLN
4.0000 mg | Freq: Four times a day (QID) | INTRAMUSCULAR | Status: DC | PRN
  Administered 2023-03-05: 4 mg via INTRAVENOUS
  Filled 2023-03-02: qty 2

## 2023-03-02 MED ORDER — FUROSEMIDE 10 MG/ML IJ SOLN
40.0000 mg | Freq: Two times a day (BID) | INTRAMUSCULAR | Status: DC
  Administered 2023-03-02 – 2023-03-03 (×2): 40 mg via INTRAVENOUS
  Filled 2023-03-02 (×2): qty 4

## 2023-03-02 MED ORDER — OXYCODONE-ACETAMINOPHEN 5-325 MG PO TABS
1.0000 | ORAL_TABLET | Freq: Once | ORAL | Status: AC
  Administered 2023-03-02: 1 via ORAL
  Filled 2023-03-02: qty 1

## 2023-03-02 MED ORDER — INSULIN ASPART 100 UNIT/ML IJ SOLN
0.0000 [IU] | Freq: Three times a day (TID) | INTRAMUSCULAR | Status: DC
  Administered 2023-03-03: 5 [IU] via SUBCUTANEOUS
  Administered 2023-03-03 (×2): 3 [IU] via SUBCUTANEOUS
  Administered 2023-03-04: 2 [IU] via SUBCUTANEOUS
  Administered 2023-03-04: 3 [IU] via SUBCUTANEOUS
  Administered 2023-03-04: 5 [IU] via SUBCUTANEOUS
  Administered 2023-03-05: 3 [IU] via SUBCUTANEOUS
  Administered 2023-03-05: 2 [IU] via SUBCUTANEOUS
  Administered 2023-03-05: 3 [IU] via SUBCUTANEOUS
  Administered 2023-03-06: 2 [IU] via SUBCUTANEOUS
  Administered 2023-03-06: 3 [IU] via SUBCUTANEOUS
  Administered 2023-03-06 – 2023-03-07 (×2): 2 [IU] via SUBCUTANEOUS
  Administered 2023-03-07: 3 [IU] via SUBCUTANEOUS

## 2023-03-02 MED ORDER — OXYCODONE HCL 5 MG PO TABS
5.0000 mg | ORAL_TABLET | ORAL | Status: DC | PRN
  Administered 2023-03-02 – 2023-03-07 (×11): 5 mg via ORAL
  Filled 2023-03-02 (×11): qty 1

## 2023-03-02 MED ORDER — HYDROMORPHONE HCL 1 MG/ML IJ SOLN
0.5000 mg | Freq: Once | INTRAMUSCULAR | Status: AC
  Administered 2023-03-02: 0.5 mg via INTRAVENOUS
  Filled 2023-03-02: qty 1

## 2023-03-02 MED ORDER — ACETAMINOPHEN 325 MG PO TABS
650.0000 mg | ORAL_TABLET | Freq: Four times a day (QID) | ORAL | Status: DC | PRN
  Administered 2023-03-03: 650 mg via ORAL
  Filled 2023-03-02 (×2): qty 2

## 2023-03-02 NOTE — ED Notes (Signed)
This RN attempted to establish IV access.

## 2023-03-02 NOTE — ED Notes (Signed)
This RN notified about hemolyzed sample D-dimer. To be recollected by this RN

## 2023-03-02 NOTE — ED Provider Notes (Signed)
[place] EMERGENCY DEPARTMENT AT [facility] Provider Note  [name]: [id number] Arrival date & time: 03/02/23 1052  Chief Complaint(s) Chest Pain and Leg Swelling  HPI Kathleen Walsh is a 42 y.o. female history of obesity, hyperlipidemia, hypertension presenting to the emergency department shortness of breath.  Patient reports shortness of breath with exertion, started today, she also reports some substernal chest pain.  She reports that over the past few days she has also developed some lower extremity swelling.  Denies any similar episode previously.  No fevers or chills.  No cough.  No recent travel or surgery.  She does report the chest pain is improved with leaning forward and worse with lying flat.   Past Medical History Past Medical History:  Diagnosis Date   Abdominal wall pain    Anemia    Anxiety    Blood dyscrasia    lupus anticoagulant during pregnancy   Complication of anesthesia    pt states became " panicky" after left shoulder surgery 10/2019   Depression    takes cymbalta    Diabetes mellitus without complication (HCC)    Headache(784.0)    migraines   Hx of lupus anticoagulant disorder    Hyperlipidemia    Hypertension    Obesity    PONV (postoperative nausea and vomiting)    Sleep apnea    USES C-PAP   Patient Active Problem List   Diagnosis Date Noted   Dental abscess 12/18/2022   Submandibular abscess 12/15/2022   Pulmonary nodule 08/08/2017   OSA on CPAP 08/08/2017   Acute back pain with sciatica, left 08/14/2016   Trochanteric bursitis, left hip 08/12/2016   Impingement syndrome of left ankle 07/20/2016   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 Authorizing Provider  amoxicillin-clavulanate (AUGMENTIN) 875-125 MG tablet Take 1 tablet by mouth 2 (two) times daily. 12/21/22   Pokhrel, Rebekah Chesterfield, MD  chlorhexidine (PERIDEX) 0.12 % solution Use as directed 15 mLs in the mouth or throat 2 (two) times daily. 12/21/22   Pokhrel, Rebekah Chesterfield, MD  dapagliflozin propanediol (FARXIGA) 10 MG TABS tablet Take 10 mg by mouth daily.    [provider]  famotidine (PEPCID) 20 MG tablet Take 1 tablet (20 mg total) by mouth 2 (two) times daily for 14 days. 12/21/22 01/04/23  Pokhrel, Rebekah Chesterfield, MD  hydrochlorothiazide (HYDRODIURIL) 25 MG tablet Take 25 mg by mouth daily.    [provider]  HYDROcodone-acetaminophen (NORCO/VICODIN) 5-325 MG tablet Take 1 tablet by mouth every 6 (six) hours as needed. 12/11/22 12/11/23  Tommi Rumps, PA-C  ibuprofen (ADVIL) 600 MG tablet Take 1 tablet (600 mg total) by mouth every 6 (six) hours as needed for headache, mild pain or moderate pain. 12/21/22   Pokhrel, Rebekah Chesterfield, MD  LANTUS SOLOSTAR 100 UNIT/ML Solostar Pen Inject 24-60 Units into the skin daily. 24 units AM, 60 units PM 12/12/22   [provider]  levocetirizine (XYZAL) 5 MG tablet  Take 5 mg by mouth daily as needed for allergies.    [provider]  lisinopril (ZESTRIL) 20 MG tablet Take 20 mg by mouth daily.    [provider]  methocarbamol (ROBAXIN) 500 MG tablet Take 500 mg by mouth daily as needed for muscle spasms. 11/01/22   [provider]  NOVOLOG FLEXPEN 100 UNIT/ML FlexPen Inject 24-30 Units into the skin 3 (three) times daily before meals. 11/07/22   [provider]  ondansetron (ZOFRAN) 8 MG tablet TAKE 1 TABLET (8  MG TOTAL) BY MOUTH 3 (THREE) TIMES DAILY AS NEEDED FOR NAUSEA OR VOMITING. 03/28/22   Imogene Burn, MD  potassium chloride SA (KLOR-CON M) 20 MEQ tablet Take 1 tablet (20 mEq total) by mouth daily. 12/21/22   Pokhrel, Rebekah Chesterfield, MD  rizatriptan (MAXALT-MLT) 10 MG disintegrating tablet Take 10 mg by mouth as needed for migraine. 11/10/22   [provider]  rosuvastatin (CRESTOR) 20 MG tablet Take 20 mg by mouth daily.    [provider]  Simethicone (GAS-X PO) Take 1 tablet by mouth daily as needed (gas).    [provider]                                                                                                                                    Past Surgical History Past Surgical History:  Procedure Laterality Date   ABDOMINAL HYSTERECTOMY  2016   ANKLE ARTHROSCOPY WITH REPAIR SUBLUXING TENDON Left    BIOPSY  11/22/2021   Procedure: BIOPSY;  Surgeon: Imogene Burn, MD;  Location: [facility];  Service: Gastroenterology;;   CESAREAN SECTION WITH BILATERAL TUBAL LIGATION Bilateral 02/11/2013   Procedure: Repeat CESAREAN SECTION WITH BILATERAL TUBAL LIGATION;  Surgeon: Lenoard Aden, MD;  Location: [facility];  Service: Obstetrics;  Laterality: Bilateral;  EDD: 03/01/13   COLONOSCOPY WITH PROPOFOL N/A 11/22/2021   Procedure: COLONOSCOPY WITH PROPOFOL;  Surgeon: Imogene Burn, MD;  Location: [facility];  Service: Gastroenterology;  Laterality: N/A;   DEBRIDEMENT OF ABDOMINAL WALL ABSCESS N/A 12/03/2014   Procedure: INCISION OF ABDOMINAL WALL LIPOMA;  Surgeon: Gaynelle Adu, MD;  Location: [facility];  Service: General;  Laterality: N/A;   DILATION AND CURETTAGE OF UTERUS  2004   DILITATION & CURRETTAGE/HYSTROSCOPY WITH NOVASURE ABLATION N/A 11/19/2014   Procedure: DILATATION & CURETTAGE/HYSTEROSCOPY WITH NOVASURE ABLATION;  Surgeon: Olivia Mackie, MD;  Location: [facility];  Service: Gynecology;  Laterality: N/A;   ENDOMETRIAL ABLATION  10/20/2014    ESOPHAGOGASTRODUODENOSCOPY (EGD) WITH PROPOFOL N/A 11/22/2021   Procedure: ESOPHAGOGASTRODUODENOSCOPY (EGD) WITH PROPOFOL;  Surgeon: Imogene Burn, MD;  Location: [facility];  Service: Gastroenterology;  Laterality: N/A;   FOOT SURGERY  2008   HERNIA REPAIR  09/19/2011   INCISION AND DRAINAGE ABSCESS N/A 12/17/2022   Procedure: INCISION AND DRAINAGE PERIMANDIBULAR ABSCESS;  Surgeon: Christia Reading, MD;  Location: [facility];  Service: ENT;  Laterality: N/A;  LAPAROSCOPY N/A 12/03/2014   Procedure: LAPAROSCOPY DIAGNOSTIC WITH LYSIS OF ADHESIONS;  Surgeon: Gaynelle Adu, MD;  Location: [facility];  Service: General;  Laterality: N/A;   left shoulder surgery      NASAL SEPTUM SURGERY     POLYPECTOMY  11/22/2021   Procedure: POLYPECTOMY;  Surgeon: Imogene Burn, MD;  Location: [facility];  Service: Gastroenterology;;  EGD and COLON   TUBAL LIGATION     VENTRAL HERNIA REPAIR  09/20/2010   Laparoscopic, Dr Gaynelle Adu   WISDOM TOOTH EXTRACTION     Family History Family History  Problem Relation Age of Onset   Diabetes Mother    Hypertension Mother    Heart disease Mother    Diabetes Father    Hypertension Father    Colon polyps Father    Colitis Father    Irritable bowel syndrome Father    Breast cancer Maternal Grandmother    Crohn's disease Maternal Grandmother    Prostate cancer Maternal Grandfather    Clotting disorder Paternal Grandmother    Leukemia Paternal Grandfather    Irritable bowel syndrome Daughter    Stomach cancer Neg Hx    Esophageal cancer Neg Hx     Social History Social History   Tobacco Use   Smoking status: Former    Current packs/day: 0.75    Average packs/day: 0.8 packs/day for 23.0 years (17.3 ttl pk-yrs)    Types: Cigarettes    Start date: 05/2017   Smokeless tobacco: Never  Vaping Use   Vaping status: Never Used  Substance Use Topics   Alcohol use: No    Comment: "once every three years"    Drug use: No   Allergies Amoxicillin, Metformin and  related, Ozempic (0.25 or 0.5 mg-dose) [semaglutide(0.25 or 0.5mg -dos)], and Trulicity [dulaglutide]  Review of Systems Review of Systems  All other systems reviewed and are negative.   Physical Exam Vital Signs  I have reviewed the triage vital signs BP 123/65   Pulse 87   Temp 98 F (36.7 C)   Resp 20   Ht 5\' 6"  (1.676 m)   Wt (!) 158.8 kg   LMP 08/31/2015   SpO2 100%   BMI 56.49 kg/m  Physical Exam Vitals and nursing note reviewed.  Constitutional:      General: She is not in acute distress.    Appearance: She is well-developed.  HENT:     Head: Normocephalic and atraumatic.     Mouth/Throat:     Mouth: Mucous membranes are moist.  Eyes:     Pupils: Pupils are equal, round, and reactive to light.  Cardiovascular:     Rate and Rhythm: Normal rate and regular rhythm.     Heart sounds: No murmur heard. Pulmonary:     Effort: Pulmonary effort is normal. No respiratory distress.     Breath sounds: Examination of the right-lower field reveals rales. Examination of the left-lower field reveals rales. Rales present.  Abdominal:     General: Abdomen is flat.     Palpations: Abdomen is soft.     Tenderness: There is no abdominal tenderness.  Musculoskeletal:        General: No tenderness.     Right lower leg: Edema present.     Left lower leg: Edema present.  Skin:    General: Skin is warm and dry.  Neurological:     General: No focal deficit present.     Mental Status: She is alert. Mental status is at baseline.  Psychiatric:        Mood and Affect: Mood normal.        Behavior: Behavior normal.     ED Results and Treatments Labs (all labs ordered are listed, but only abnormal results are displayed) Labs Reviewed  BASIC METABOLIC PANEL - Abnormal; Notable for the following components:      Result Value   CO2 18 (*)    Glucose, Bld 182 (*)    All other components within normal limits  CBC - Abnormal; Notable for the following components:   WBC 13.5 (*)     RBC 5.15 (*)    MCH 25.8 (*)    RDW 16.2 (*)    All other components within normal limits  BRAIN NATRIURETIC PEPTIDE - Abnormal; Notable for the following components:   B Natriuretic Peptide 296.4 (*)    All other components within normal limits  D-DIMER, QUANTITATIVE (NOT AT [facility])  TROPONIN I (HIGH SENSITIVITY)  TROPONIN I (HIGH SENSITIVITY)                                                                                                                          Radiology DG Chest 2 View  Result Date: 03/02/2023 CLINICAL DATA:  Midsternal chest pain EXAM: CHEST - 2 VIEW COMPARISON:  08/03/2022 FINDINGS: Evaluation is limited by body habitus. Redemonstrated increased cardiac contour. Prominence of the central pulmonary vasculature. No definite focal pulmonary opacity. Possible increased interstitial markings. No pleural effusion or pneumothorax. No acute osseous abnormality. IMPRESSION: 1. Evaluation is limited by body habitus. 2. Cardiomegaly with possible pulmonary edema. Electronically Signed   By: Wiliam Ke M.D.   On: 03/02/2023 13:06    Pertinent labs & imaging results that were available during my care of the patient were reviewed by me and considered in my medical decision making (see MDM for details).  Medications Ordered in ED Medications  oxyCODONE-acetaminophen (PERCOCET/ROXICET) 5-325 MG per tablet 1 tablet (1 tablet Oral Given 03/02/23 1303)  furosemide (LASIX) injection 40 mg (40 mg Intravenous Given 03/02/23 1621)  HYDROmorphone (DILAUDID) injection 0.5 mg (0.5 mg Intravenous Given 03/02/23 1624)                                                                                                                                     Procedures Procedures  (including critical care time)  Medical Decision Making / ED Course  MDM:  42 year old presenting to the emergency department with chest pain, shortness of breath.  On exam, patient has 2+ lower extremity swelling.   Her BNP is elevated and her chest x-ray shows cardiomegaly and pulmonary edema.  She is quite obese so her BNP is probably falsely low as well.  Symptoms seem consistent with acute CHF.  Reviewed prior echo from 2019 and patient had normal systolic function.  She is not on Lasix normally.  Will give Lasix.  Given new diagnosis would admit the patient for further evaluation.  Evaluated chest pain with troponin which is reassuring, EKG without signs of ACS.  Obtain D-dimer given pleuritic nature of pain which was negative, low concern for PE.  Her pain could be potentially a sign of pericarditis given that it is improved with leaning forward and worse with lying flat but she does not have any specific EKG changes of this.  Would benefit from echocardiogram for further evaluation. Discussed with hospitalist who will see the patient.         Additional history obtained: -Additional history obtained from ems    Lab Tests: -I ordered, reviewed, and interpreted labs.   The pertinent results include:   Labs Reviewed  BASIC METABOLIC PANEL - Abnormal; Notable for the following components:      Result Value   CO2 18 (*)    Glucose, Bld 182 (*)    All other components within normal limits  CBC - Abnormal; Notable for the following components:   WBC 13.5 (*)    RBC 5.15 (*)    MCH 25.8 (*)    RDW 16.2 (*)    All other components within normal limits  BRAIN NATRIURETIC PEPTIDE - Abnormal; Notable for the following components:   B Natriuretic Peptide 296.4 (*)    All other components within normal limits  D-DIMER, QUANTITATIVE (NOT AT [facility])  TROPONIN I (HIGH SENSITIVITY)  TROPONIN I (HIGH SENSITIVITY)    Notable for mild acidosis ,leukocytosis   EKG   EKG Interpretation Date/Time:  Thursday March 02 2023 11:10:12 EDT Ventricular Rate:  87 PR Interval:  148 QRS Duration:  80 QT Interval:  392 QTC Calculation: 471 R Axis:   76  Text Interpretation: Normal sinus rhythm Normal ECG  Confirmed by Alvino Blood ([id number]) on 03/02/2023 12:31:08 PM         Imaging Studies ordered: I ordered imaging studies including CXR On my interpretation imaging demonstrates pulmonary edema  I independently visualized and interpreted imaging. I agree with the radiologist interpretation   Medicines ordered and prescription drug management: Meds ordered this encounter  Medications   oxyCODONE-acetaminophen (PERCOCET/ROXICET) 5-325 MG per tablet 1 tablet   furosemide (LASIX) injection 40 mg   HYDROmorphone (DILAUDID) injection 0.5 mg    -I have reviewed the patients home medicines and have made adjustments as needed  Cardiac Monitoring: The patient was maintained on a cardiac monitor.  I personally viewed and interpreted the cardiac monitored which showed an underlying rhythm of: CXR  Social Determinants of Health:  Diagnosis or treatment significantly limited by social determinants of health: obesity   Reevaluation: After the interventions noted above, I reevaluated the patient and found that their symptoms have improved  Co morbidities that complicate the patient evaluation  Past Medical History:  Diagnosis Date   Abdominal wall pain    Anemia    Anxiety    Blood dyscrasia    lupus anticoagulant during pregnancy   Complication of anesthesia    pt  states became " panicky" after left shoulder surgery 10/2019   Depression    takes cymbalta    Diabetes mellitus without complication (HCC)    Headache(784.0)    migraines   Hx of lupus anticoagulant disorder    Hyperlipidemia    Hypertension    Obesity    PONV (postoperative nausea and vomiting)    Sleep apnea    USES C-PAP      Dispostion: Disposition decision including need for hospitalization was considered, and patient admitted to the hospital.    Final Clinical Impression(s) / ED Diagnoses Final diagnoses:  Acute congestive heart failure, unspecified heart failure type [facility])     This chart was  dictated using voice recognition software.  Despite best efforts to proofread,  errors can occur which can change the documentation meaning.    Lonell Grandchild, MD 03/02/23 [phone]

## 2023-03-02 NOTE — ED Triage Notes (Signed)
Pt reports waking with CP around 5am this morning with mid sternal CP. Went to urgent care for symptoms, pt reports normal EKG there but sent here for further eval of lower leg swelling. Pt reports SOB with exertion.

## 2023-03-02 NOTE — ED Notes (Signed)
Awaiting patient from lobby 

## 2023-03-02 NOTE — ED Notes (Signed)
Pt transpported to xray will transport pt to treatment room when finished

## 2023-03-02 NOTE — ED Notes (Signed)
ED TO INPATIENT HANDOFF REPORT  ED Nurse Name and Phone #: Evander Macaraeg [phone]  S Name/Age/Gender Kathleen Walsh 42 y.o. female Room/Bed: 011C/011C  Code Status   Code Status: Full Code  Home/SNF/Other Home Patient oriented to: self, place, time, and situation Is this baseline? Yes   Triage Complete: Triage complete  Chief Complaint CHF (congestive heart failure) (HCC) [I50.9]  Triage Note Pt reports waking with CP around 5am this morning with mid sternal CP. Went to urgent care for symptoms, pt reports normal EKG there but sent here for further eval of lower leg swelling. Pt reports SOB with exertion.    Allergies Allergies  Allergen Reactions   Amoxicillin Nausea And Vomiting    Has patient had a PCN reaction causing immediate rash, facial/tongue/throat swelling, SOB or lightheadedness with hypotension: No Has patient had a PCN reaction causing severe rash involving mucus membranes or skin necrosis: No Has patient had a PCN reaction that required hospitalization: No Has patient had a PCN reaction occurring within the last 10 years: Yes If all of the above answers are "NO", then may proceed with Cephalosporin use.    Metformin And Related Nausea And Vomiting   Ozempic (0.25 Or 0.5 Mg-Dose) [Semaglutide(0.25 Or 0.5mg -Dos)] Nausea And Vomiting   Trulicity [Dulaglutide] Nausea And Vomiting    Level of Care/Admitting Diagnosis ED Disposition     ED Disposition  Admit   Condition  --   Comment  Hospital Area: [facility] [[id number]]  Level of Care: Telemetry Medical [[id number]]  May admit patient to [facility] or [facility] if equivalent level of care is available:: No  Covid Evaluation: Asymptomatic - no recent exposure (last 10 days) testing not required  Diagnosis: CHF (congestive heart failure) [facility]) [[id number]]  Admitting Physician: Jerald Kief [[id number]]  Attending Physician: Jerald Kief [phone]  Certification:: I certify this patient will need inpatient  services for at least 2 midnights  Expected Medical Readiness: 03/03/2023          B Medical/Surgery History Past Medical History:  Diagnosis Date   Abdominal wall pain    Anemia    Anxiety    Blood dyscrasia    lupus anticoagulant during pregnancy   Complication of anesthesia    pt states became " panicky" after left shoulder surgery 10/2019   Depression    takes cymbalta    Diabetes mellitus without complication (HCC)    Headache(784.0)    migraines   Hx of lupus anticoagulant disorder    Hyperlipidemia    Hypertension    Obesity    PONV (postoperative nausea and vomiting)    Sleep apnea    USES C-PAP   Past Surgical History:  Procedure Laterality Date   ABDOMINAL HYSTERECTOMY  2016   ANKLE ARTHROSCOPY WITH REPAIR SUBLUXING TENDON Left    BIOPSY  11/22/2021   Procedure: BIOPSY;  Surgeon: Imogene Burn, MD;  Location: [facility];  Service: Gastroenterology;;   CESAREAN SECTION WITH BILATERAL TUBAL LIGATION Bilateral 02/11/2013   Procedure: Repeat CESAREAN SECTION WITH BILATERAL TUBAL LIGATION;  Surgeon: Lenoard Aden, MD;  Location: [facility];  Service: Obstetrics;  Laterality: Bilateral;  EDD: 03/01/13   COLONOSCOPY WITH PROPOFOL N/A 11/22/2021   Procedure: COLONOSCOPY WITH PROPOFOL;  Surgeon: Imogene Burn, MD;  Location: [facility];  Service: Gastroenterology;  Laterality: N/A;   DEBRIDEMENT OF ABDOMINAL WALL ABSCESS N/A 12/03/2014   Procedure: INCISION OF ABDOMINAL WALL LIPOMA;  Surgeon: Gaynelle Adu, MD;  Location: [facility];  Service: General;  Laterality: N/A;   DILATION AND CURETTAGE OF UTERUS  2004   DILITATION & CURRETTAGE/HYSTROSCOPY WITH NOVASURE ABLATION N/A 11/19/2014   Procedure: DILATATION & CURETTAGE/HYSTEROSCOPY WITH NOVASURE ABLATION;  Surgeon: Olivia Mackie, MD;  Location: [facility];  Service: Gynecology;  Laterality: N/A;   ENDOMETRIAL ABLATION  10/20/2014   ESOPHAGOGASTRODUODENOSCOPY (EGD) WITH PROPOFOL N/A 11/22/2021   Procedure:  ESOPHAGOGASTRODUODENOSCOPY (EGD) WITH PROPOFOL;  Surgeon: Imogene Burn, MD;  Location: [facility];  Service: Gastroenterology;  Laterality: N/A;   FOOT SURGERY  2008   HERNIA REPAIR  09/19/2011   INCISION AND DRAINAGE ABSCESS N/A 12/17/2022   Procedure: INCISION AND DRAINAGE PERIMANDIBULAR ABSCESS;  Surgeon: Christia Reading, MD;  [facility];  Service: ENT;  Laterality: N/A;   LAPAROSCOPY N/A 12/03/2014   Procedure: LAPAROSCOPY DIAGNOSTIC WITH LYSIS OF ADHESIONS;  Surgeon: Gaynelle Adu, MD;  Location: [facility];  Service: General;  Laterality: N/A;   left shoulder surgery      NASAL SEPTUM SURGERY     POLYPECTOMY  11/22/2021   Procedure: POLYPECTOMY;  Surgeon: Imogene Burn, MD;  Location: [facility];  Service: Gastroenterology;;  EGD and COLON   TUBAL LIGATION     VENTRAL HERNIA REPAIR  09/20/2010   Laparoscopic, Dr Gaynelle Adu   WISDOM TOOTH EXTRACTION       A IV Location/Drains/Wounds Patient Lines/Drains/Airways Status     Active Line/Drains/Airways     Name Placement date Placement time Site Days   Peripheral IV 03/02/23 20 G 1.88" Anterior;Proximal;Right Forearm 03/02/23  1203  Forearm  less than 1   Incision - 2 Ports Abdomen Left;Upper Left;Lower 12/03/14  0958  -- 3011            Intake/Output Last 24 hours No intake or output data in the 24 hours ending 03/02/23 1734  Labs/Imaging Results for orders placed or performed during the hospital encounter of 03/02/23 (from the past 48 hour(s))  Basic metabolic panel     Status: Abnormal   Collection Time: 03/02/23 12:02 PM  Result Value Ref Range   Sodium 136 135 - 145 mmol/L   Potassium 4.6 3.5 - 5.1 mmol/L   Chloride 104 98 - 111 mmol/L   CO2 18 (L) 22 - 32 mmol/L   Glucose, Bld 182 (H) 70 - 99 mg/dL    Comment: Glucose reference range applies only to samples taken after fasting for at least 8 hours.   BUN 13 6 - 20 mg/dL   Creatinine, Ser 0.98 0.44 - 1.00 mg/dL   Calcium 9.2 8.9 - 11.9 mg/dL   GFR, Estimated  >14 >78 mL/min    Comment: (NOTE) Calculated using the CKD-EPI Creatinine Equation (2021)    Anion gap 14 5 - 15    Comment: Performed at [facility], [address], [place], [place] [address]  CBC     Status: Abnormal   Collection Time: 03/02/23 12:02 PM  Result Value Ref Range   WBC 13.5 (H) 4.0 - 10.5 K/uL   RBC 5.15 (H) 3.87 - 5.11 MIL/uL   Hemoglobin 13.3 12.0 - 15.0 g/dL   HCT 13.0 86.5 - 78.4 %   MCV 85.2 80.0 - 100.0 fL   MCH 25.8 (L) 26.0 - 34.0 pg   MCHC 30.3 30.0 - 36.0 g/dL   RDW 69.6 (H) 29.5 - 28.4 %   Platelets 230 150 - 400 K/uL   nRBC 0.0 0.0 - 0.2 %    Comment: Performed at [facility]  Lab, [address], [place], [place] [address]  Troponin I (High Sensitivity)     Status: None   Collection Time: 03/02/23 12:02 PM  Result Value Ref Range   Troponin I (High Sensitivity) 8 <18 ng/L    Comment: (NOTE) Elevated high sensitivity troponin I (hsTnI) values and significant  changes across serial measurements may suggest ACS but many other  chronic and acute conditions are known to elevate hsTnI results.  Refer to the "Links" section for chest pain algorithms and additional  guidance. Performed at [facility], [address], [place], [place] [address]   Troponin I (High Sensitivity)     Status: None   Collection Time: 03/02/23  1:05 PM  Result Value Ref Range   Troponin I (High Sensitivity) 5 <18 ng/L    Comment: (NOTE) Elevated high sensitivity troponin I (hsTnI) values and significant  changes across serial measurements may suggest ACS but many other  chronic and acute conditions are known to elevate hsTnI results.  Refer to the "Links" section for chest pain algorithms and additional  guidance. Performed at [facility], [address], [place], [place] [address]   Brain natriuretic peptide     Status: Abnormal   Collection Time: 03/02/23  1:30 PM  Result Value Ref Range   B Natriuretic Peptide 296.4 (H) 0.0 - 100.0 pg/mL     Comment: Performed at [facility], [address], [place], [place] [address]  D-dimer, quantitative     Status: None   Collection Time: 03/02/23  2:05 PM  Result Value Ref Range   D-Dimer, Quant <0.27 0.00 - 0.50 ug/mL-FEU    Comment: (NOTE) At the manufacturer cut-off value of 0.5 g/mL FEU, this assay has a negative predictive value of 95-100%.This assay is intended for use in conjunction with a clinical pretest probability (PTP) assessment model to exclude pulmonary embolism (PE) and deep venous thrombosis (DVT) in outpatients suspected of PE or DVT. Results should be correlated with clinical presentation. Performed at [facility], [address], [place], [place] [address]    DG Chest 2 View  Result Date: 03/02/2023 CLINICAL DATA:  Midsternal chest pain EXAM: CHEST - 2 VIEW COMPARISON:  08/03/2022 FINDINGS: Evaluation is limited by body habitus. Redemonstrated increased cardiac contour. Prominence of the central pulmonary vasculature. No definite focal pulmonary opacity. Possible increased interstitial markings. No pleural effusion or pneumothorax. No acute osseous abnormality. IMPRESSION: 1. Evaluation is limited by body habitus. 2. Cardiomegaly with possible pulmonary edema. Electronically Signed   By: Wiliam Ke M.D.   On: 03/02/2023 13:06    Pending Labs Unresulted Labs (From admission, onward)     Start     Ordered   03/09/23 0500  Creatinine, serum  (enoxaparin (LOVENOX)    CrCl >/= 30 ml/min)  Weekly,   R     Comments: while on enoxaparin therapy    03/02/23 1727   03/03/23 0500  Comprehensive metabolic panel  Tomorrow morning,   R        03/02/23 1727   03/03/23 0500  CBC  Tomorrow morning,   R        03/02/23 1727   03/02/23 1727  TSH  Once,   R        03/02/23 1727   03/02/23 1726  Hemoglobin A1c  Once,   R       Comments: To assess prior glycemic control    03/02/23 1727   03/02/23 1726  CBC  (enoxaparin (LOVENOX)    CrCl >/= 30 ml/min)  Once,    R       Comments: Baseline for enoxaparin therapy IF NOT ALREADY DRAWN.  Notify MD if PLT < 100 K.    03/02/23 1727   03/02/23 1726  Creatinine, serum  (enoxaparin (LOVENOX)    CrCl >/= 30 ml/min)  Once,   R       Comments: Baseline for enoxaparin therapy IF NOT ALREADY DRAWN.    03/02/23 1727            Vitals/Pain Today's Vitals   03/02/23 1410 03/02/23 1445 03/02/23 1456 03/02/23 1702  BP:  123/65    Pulse: 86 87    Resp: 13 20    Temp:   98 F (36.7 C)   TempSrc:      SpO2: 99% 100%    Weight:      Height:      PainSc:    8     Isolation Precautions No active isolations  Medications Medications  insulin aspart (novoLOG) injection 0-15 Units (has no administration in time range)  insulin aspart (novoLOG) injection 0-5 Units (has no administration in time range)  enoxaparin (LOVENOX) injection 40 mg (has no administration in time range)  acetaminophen (TYLENOL) tablet 650 mg (has no administration in time range)    Or  acetaminophen (TYLENOL) suppository 650 mg (has no administration in time range)  furosemide (LASIX) injection 40 mg (has no administration in time range)  oxyCODONE (Oxy IR/ROXICODONE) immediate release tablet 5 mg (has no administration in time range)  nitroGLYCERIN (NITROSTAT) SL tablet 0.4 mg (has no administration in time range)  oxyCODONE-acetaminophen (PERCOCET/ROXICET) 5-325 MG per tablet 1 tablet (1 tablet Oral Given 03/02/23 1303)  furosemide (LASIX) injection 40 mg (40 mg Intravenous Given 03/02/23 1621)  HYDROmorphone (DILAUDID) injection 0.5 mg (0.5 mg Intravenous Given 03/02/23 1624)    Mobility walks     Focused Assessments    R Recommendations: See Admitting Provider Note  Report given to:   Additional Notes:

## 2023-03-02 NOTE — ED Notes (Signed)
Phlebotomy at bedside to collect D-dimer

## 2023-03-02 NOTE — H&P (Signed)
History and Physical    Patient: Kathleen Walsh:[id number] DOB: [birth year] DOA: 03/02/2023 DOS: the patient was seen and examined on 03/02/2023 PCP: [facility]  Patient coming from: Home  Chief Complaint:  Chief Complaint  Patient presents with   Chest Pain   Leg Swelling   HPI: Kathleen Walsh is a 42 y.o. female with medical history significant of obesity, DM, HLD, HTN, OSA presented with increased sob and LE swelling over the past 1-2 weeks. Also reports chest pain worse lying down, improved with tilting the chin up. Also believed pain improved after drinking cold water. Reports increased LE swelling even with raising LE.  In the ED, serial trop were unremarkable. EKG was found to be nonischemic. Pt was given trial of analgesia with only limited effect. CXR notable for cardiomegaly with evidence of pulmonary edema. Pt was given a dose of IV lasix. Hospitalist consulted for consideration for admission   Review of Systems: As mentioned in the history of present illness. All other systems reviewed and are negative. Past Medical History:  Diagnosis Date   Abdominal wall pain    Anemia    Anxiety    Blood dyscrasia    lupus anticoagulant during pregnancy   Complication of anesthesia    pt states became " panicky" after left shoulder surgery 10/2019   Depression    takes cymbalta    Diabetes mellitus without complication (HCC)    Headache(784.0)    migraines   Hx of lupus anticoagulant disorder    Hyperlipidemia    Hypertension    Obesity    PONV (postoperative nausea and vomiting)    Sleep apnea    USES C-PAP   Past Surgical History:  Procedure Laterality Date   ABDOMINAL HYSTERECTOMY  2016   ANKLE ARTHROSCOPY WITH REPAIR SUBLUXING TENDON Left    BIOPSY  11/22/2021   Procedure: BIOPSY;  Surgeon: Imogene Burn, MD;  Location: [facility];  Service: Gastroenterology;;   CESAREAN SECTION WITH BILATERAL TUBAL LIGATION Bilateral 02/11/2013    Procedure: Repeat CESAREAN SECTION WITH BILATERAL TUBAL LIGATION;  Surgeon: Lenoard Aden, MD;  Location: [facility];  Service: Obstetrics;  Laterality: Bilateral;  EDD: 03/01/13   COLONOSCOPY WITH PROPOFOL N/A 11/22/2021   Procedure: COLONOSCOPY WITH PROPOFOL;  Surgeon: Imogene Burn, MD;  Location: [facility];  Service: Gastroenterology;  Laterality: N/A;   DEBRIDEMENT OF ABDOMINAL WALL ABSCESS N/A 12/03/2014   Procedure: INCISION OF ABDOMINAL WALL LIPOMA;  Surgeon: Gaynelle Adu, MD;  Location: [facility];  Service: General;  Laterality: N/A;   DILATION AND CURETTAGE OF UTERUS  2004   DILITATION & CURRETTAGE/HYSTROSCOPY WITH NOVASURE ABLATION N/A 11/19/2014   Procedure: DILATATION & CURETTAGE/HYSTEROSCOPY WITH NOVASURE ABLATION;  Surgeon: Olivia Mackie, MD;  Location: [facility];  Service: Gynecology;  Laterality: N/A;   ENDOMETRIAL ABLATION  10/20/2014   ESOPHAGOGASTRODUODENOSCOPY (EGD) WITH PROPOFOL N/A 11/22/2021   Procedure: ESOPHAGOGASTRODUODENOSCOPY (EGD) WITH PROPOFOL;  Surgeon: Imogene Burn, MD;  Location: [facility];  Service: Gastroenterology;  Laterality: N/A;   FOOT SURGERY  2008   HERNIA REPAIR  09/19/2011   INCISION AND DRAINAGE ABSCESS N/A 12/17/2022   Procedure: INCISION AND DRAINAGE PERIMANDIBULAR ABSCESS;  Surgeon: Christia Reading, MD;  Location: [facility];  Service: ENT;  Laterality: N/A;   LAPAROSCOPY N/A 12/03/2014   Procedure: LAPAROSCOPY DIAGNOSTIC WITH LYSIS OF ADHESIONS;  Surgeon: Gaynelle Adu, MD;  Location: [facility];  Service: General;  Laterality: N/A;   left shoulder surgery  NASAL SEPTUM SURGERY     POLYPECTOMY  11/22/2021   Procedure: POLYPECTOMY;  Surgeon: Imogene Burn, MD;  Location: [facility];  Service: Gastroenterology;;  EGD and COLON   TUBAL LIGATION     VENTRAL HERNIA REPAIR  09/20/2010   Laparoscopic, Dr Gaynelle Adu   WISDOM TOOTH EXTRACTION     Social History:  reports that she has quit smoking. Her smoking use included cigarettes. She started smoking  about 5 years ago. She has a 17.3 pack-year smoking history. She has never used smokeless tobacco. She reports that she does not drink alcohol and does not use drugs.  Allergies  Allergen Reactions   Amoxicillin Nausea And Vomiting    Has patient had a PCN reaction causing immediate rash, facial/tongue/throat swelling, SOB or lightheadedness with hypotension: No Has patient had a PCN reaction causing severe rash involving mucus membranes or skin necrosis: No Has patient had a PCN reaction that required hospitalization: No Has patient had a PCN reaction occurring within the last 10 years: Yes If all of the above answers are "NO", then may proceed with Cephalosporin use.    Metformin And Related Nausea And Vomiting   Ozempic (0.25 Or 0.5 Mg-Dose) [Semaglutide(0.25 Or 0.5mg -Dos)]    Trulicity [Dulaglutide] Nausea And Vomiting    Family History  Problem Relation Age of Onset   Diabetes Mother    Hypertension Mother    Heart disease Mother    Diabetes Father    Hypertension Father    Colon polyps Father    Colitis Father    Irritable bowel syndrome Father    Breast cancer Maternal Grandmother    Crohn's disease Maternal Grandmother    Prostate cancer Maternal Grandfather    Clotting disorder Paternal Grandmother    Leukemia Paternal Grandfather    Irritable bowel syndrome Daughter    Stomach cancer Neg Hx    Esophageal cancer Neg Hx     Prior to Admission medications   Medication Sig Start Date End Date Taking? Authorizing Provider  amoxicillin-clavulanate (AUGMENTIN) 875-125 MG tablet Take 1 tablet by mouth 2 (two) times daily. 12/21/22   Pokhrel, Rebekah Chesterfield, MD  chlorhexidine (PERIDEX) 0.12 % solution Use as directed 15 mLs in the mouth or throat 2 (two) times daily. 12/21/22   Pokhrel, Rebekah Chesterfield, MD  dapagliflozin propanediol (FARXIGA) 10 MG TABS tablet Take 10 mg by mouth daily.    [provider]  famotidine (PEPCID) 20 MG tablet Take 1 tablet (20 mg total) by mouth 2 (two)  times daily for 14 days. 12/21/22 01/04/23  Pokhrel, Rebekah Chesterfield, MD  hydrochlorothiazide (HYDRODIURIL) 25 MG tablet Take 25 mg by mouth daily.    [provider]  HYDROcodone-acetaminophen (NORCO/VICODIN) 5-325 MG tablet Take 1 tablet by mouth every 6 (six) hours as needed. 12/11/22 12/11/23  Tommi Rumps, PA-C  ibuprofen (ADVIL) 600 MG tablet Take 1 tablet (600 mg total) by mouth every 6 (six) hours as needed for headache, mild pain or moderate pain. 12/21/22   Pokhrel, Rebekah Chesterfield, MD  LANTUS SOLOSTAR 100 UNIT/ML Solostar Pen Inject 24-60 Units into the skin daily. 24 units AM, 60 units PM 12/12/22   [provider]  levocetirizine (XYZAL) 5 MG tablet Take 5 mg by mouth daily as needed for allergies.    [provider]  lisinopril (ZESTRIL) 20 MG tablet Take 20 mg by mouth daily.    [provider]  methocarbamol (ROBAXIN) 500 MG tablet Take 500 mg by mouth daily as needed for muscle spasms. 11/01/22  [provider]  NOVOLOG FLEXPEN 100 UNIT/ML FlexPen Inject 24-30 Units into the skin 3 (three) times daily before meals. 11/07/22   [provider]  ondansetron (ZOFRAN) 8 MG tablet TAKE 1 TABLET (8 MG TOTAL) BY MOUTH 3 (THREE) TIMES DAILY AS NEEDED FOR NAUSEA OR VOMITING. 03/28/22   Imogene Burn, MD  potassium chloride SA (KLOR-CON M) 20 MEQ tablet Take 1 tablet (20 mEq total) by mouth daily. 12/21/22   Pokhrel, Rebekah Chesterfield, MD  rizatriptan (MAXALT-MLT) 10 MG disintegrating tablet Take 10 mg by mouth as needed for migraine. 11/10/22   [provider]  rosuvastatin (CRESTOR) 20 MG tablet Take 20 mg by mouth daily.    [provider]  Simethicone (GAS-X PO) Take 1 tablet by mouth daily as needed (gas).    [provider]    Physical Exam: Vitals:   03/02/23 1401 03/02/23 1410 03/02/23 1445 03/02/23 1456  BP:   123/65   Pulse: 89 86 87   Resp: 14 13 20    Temp:    98 F (36.7 C)  TempSrc:      SpO2: 100% 99% 100%   Weight:       Height:       General exam: Awake, laying in bed, in nad Respiratory system: Normal respiratory effort, no wheezing Cardiovascular system: regular rate, s1, s2 Gastrointestinal system: Soft, nondistended, positive BS Central nervous system: CN2-12 grossly intact, strength intact Extremities: Perfused, no clubbing, BLE edema Skin: Normal skin turgor, no notable skin lesions seen Psychiatry: Mood normal // no visual hallucinations   Data Reviewed:  Labs reviewed: Na 136, K 4.6, Cr 0.56, BNP 296.4, WBC 13.5, Hgb 13.3, Plts 230  Assessment and Plan: Sob and swelling -Previously worked up by Pulmonary and Cardiology as outpatient with no definitive etiology that can be identified as recent as 2019 and as early as 2015 -BNP is mildly elevated -CXR with findings suggestive of cardiomegaly with pulm edema -Will cont with lasix for now -follow I/o and daily wt and bmet trends  Insulin dependent DM type 2 Cont on home insulin  Cont on ssi as needed   Obstructive sleep apnea. Continue CPAP nightly   Essential hypertension Would cont with home regimen as tolerated    Morbid obesity Recommend weight loss as outpatient.   Hyperlipidemia Continue statin per home regimen    Advance Care Planning:   Code Status: Prior Full  Consults:   Family Communication: Pt in room  Severity of Illness: The appropriate patient status for this patient is INPATIENT. Inpatient status is judged to be reasonable and necessary in order to provide the required intensity of service to ensure the patient's safety. The patient's presenting symptoms, physical exam findings, and initial radiographic and laboratory data in the context of their chronic comorbidities is felt to place them at high risk for further clinical deterioration. Furthermore, it is not anticipated that the patient will be medically stable for discharge from the hospital within 2 midnights of admission.   * I certify that at the point of  admission it is my clinical judgment that the patient will require inpatient hospital care spanning beyond 2 midnights from the point of admission due to high intensity of service, high risk for further deterioration and high frequency of surveillance required.*  Author: Rickey Barbara, MD 03/02/2023 4:46 PM  For on call review [email or web address].

## 2023-03-02 NOTE — Progress Notes (Signed)
PT c/o headache 6/10 (hx migraines) . Also c/o mid chest pain 7/10 described as cramping and pressure that gets worse with deep breaths and swallowing. Nitro given earlier did not help with chest pain, she also received dose of oxycodone at 1944 in ED.  Joneen Roach, MD notified about above findings. Pt in no distress, appropriate mental status. Dilaudid ordered for pain, on reassessment pt asleep. Will continue to monitor pt closely.

## 2023-03-02 NOTE — Progress Notes (Signed)
Pt has arrived to [address].  Alert and oriented x 4, identified appropriately. VS stable, no signs of acute distress. Cardiac monitor placed on pt and CCMD notified. Pt oriented to room and equipment, instructed to use call bell for assistance, and call bell left within reach. Will continue to monitor pt and treat per MD orders.

## 2023-03-03 ENCOUNTER — Inpatient Hospital Stay (HOSPITAL_COMMUNITY): Payer: Medicaid Other

## 2023-03-03 DIAGNOSIS — I5021 Acute systolic (congestive) heart failure: Secondary | ICD-10-CM | POA: Diagnosis not present

## 2023-03-03 DIAGNOSIS — I509 Heart failure, unspecified: Secondary | ICD-10-CM | POA: Diagnosis not present

## 2023-03-03 LAB — COMPREHENSIVE METABOLIC PANEL
ALT: 20 U/L (ref 0–44)
AST: 17 U/L (ref 15–41)
Albumin: 3.4 g/dL — ABNORMAL LOW (ref 3.5–5.0)
Alkaline Phosphatase: 65 U/L (ref 38–126)
Anion gap: 11 (ref 5–15)
BUN: 14 mg/dL (ref 6–20)
CO2: 24 mmol/L (ref 22–32)
Calcium: 9 mg/dL (ref 8.9–10.3)
Chloride: 102 mmol/L (ref 98–111)
Creatinine, Ser: 0.59 mg/dL (ref 0.44–1.00)
GFR, Estimated: >60 mL/min (ref >60)
Glucose, Bld: 161 mg/dL — ABNORMAL HIGH (ref 70–99)
Potassium: 3.7 mmol/L (ref 3.5–5.1)
Sodium: 137 mmol/L (ref 135–145)
Total Bilirubin: 0.6 mg/dL (ref 0.3–1.2)
Total Protein: 6.7 g/dL (ref 6.5–8.1)

## 2023-03-03 LAB — ECHOCARDIOGRAM COMPLETE
AV Mean grad: 4.7 mm[Hg]
AV Peak grad: 8.9 mm[Hg]
Ao pk vel: 1.49 m/s
Area-P 1/2: 4.31 cm2
Height: 66 in
Weight: 5604.98 [oz_av]

## 2023-03-03 LAB — GLUCOSE, CAPILLARY
Glucose-Capillary: 151 mg/dL — ABNORMAL HIGH (ref 70–99)
Glucose-Capillary: 154 mg/dL — ABNORMAL HIGH (ref 70–99)
Glucose-Capillary: 203 mg/dL — ABNORMAL HIGH (ref 70–99)
Glucose-Capillary: 188 mg/dL — ABNORMAL HIGH (ref 70–99)

## 2023-03-03 LAB — CBC
HCT: 44.8 % (ref 36.0–46.0)
Hemoglobin: 13.8 g/dL (ref 12.0–15.0)
MCH: 25.6 pg — ABNORMAL LOW (ref 26.0–34.0)
MCHC: 30.8 g/dL (ref 30.0–36.0)
MCV: 83.1 fL (ref 80.0–100.0)
Platelets: 242 10*3/uL (ref 150–400)
RBC: 5.39 MIL/uL — ABNORMAL HIGH (ref 3.87–5.11)
RDW: 16.3 % — ABNORMAL HIGH (ref 11.5–15.5)
WBC: 11.2 10*3/uL — ABNORMAL HIGH (ref 4.0–10.5)
nRBC: 0 % (ref 0.0–0.2)

## 2023-03-03 MED ORDER — DAPAGLIFLOZIN PROPANEDIOL 10 MG PO TABS
10.0000 mg | ORAL_TABLET | Freq: Every day | ORAL | Status: DC
  Administered 2023-03-03 – 2023-03-07 (×5): 10 mg via ORAL
  Filled 2023-03-03 (×5): qty 1

## 2023-03-03 MED ORDER — ROSUVASTATIN CALCIUM 20 MG PO TABS
40.0000 mg | ORAL_TABLET | Freq: Every day | ORAL | Status: DC
  Administered 2023-03-03 – 2023-03-07 (×5): 40 mg via ORAL
  Filled 2023-03-03 (×5): qty 2

## 2023-03-03 MED ORDER — FUROSEMIDE 10 MG/ML IJ SOLN
20.0000 mg | Freq: Once | INTRAMUSCULAR | Status: AC
  Administered 2023-03-03: 20 mg via INTRAVENOUS
  Filled 2023-03-03: qty 2

## 2023-03-03 MED ORDER — ORAL CARE MOUTH RINSE: 15.0000 mL | OROMUCOSAL | Status: DC | PRN

## 2023-03-03 MED ORDER — LISINOPRIL 20 MG PO TABS
20.0000 mg | ORAL_TABLET | Freq: Every day | ORAL | Status: DC
  Administered 2023-03-03 – 2023-03-07 (×5): 20 mg via ORAL
  Filled 2023-03-03 (×5): qty 1

## 2023-03-03 MED ORDER — SUMATRIPTAN SUCCINATE 50 MG PO TABS
50.0000 mg | ORAL_TABLET | ORAL | Status: DC | PRN
  Administered 2023-03-05: 50 mg via ORAL
  Filled 2023-03-03 (×2): qty 1

## 2023-03-03 MED ORDER — METHOCARBAMOL 500 MG PO TABS
500.0000 mg | ORAL_TABLET | Freq: Every day | ORAL | Status: DC | PRN
  Administered 2023-03-03 – 2023-03-06 (×2): 500 mg via ORAL
  Filled 2023-03-03 (×2): qty 1

## 2023-03-03 MED ORDER — PERFLUTREN LIPID MICROSPHERE
1.0000 mL | INTRAVENOUS | Status: AC | PRN
  Administered 2023-03-03: 5 mL via INTRAVENOUS

## 2023-03-03 MED ORDER — FUROSEMIDE 10 MG/ML IJ SOLN
60.0000 mg | Freq: Two times a day (BID) | INTRAMUSCULAR | Status: DC
  Administered 2023-03-03 – 2023-03-06 (×7): 60 mg via INTRAVENOUS
  Filled 2023-03-03 (×7): qty 6

## 2023-03-03 MED ORDER — INSULIN GLARGINE-YFGN 100 UNIT/ML ~~LOC~~ SOLN
80.0000 [IU] | Freq: Every day | SUBCUTANEOUS | Status: DC
  Administered 2023-03-03 – 2023-03-06 (×4): 80 [IU] via SUBCUTANEOUS
  Filled 2023-03-03 (×5): qty 0.8

## 2023-03-03 MED ORDER — KETOROLAC TROMETHAMINE 30 MG/ML IJ SOLN
30.0000 mg | Freq: Four times a day (QID) | INTRAMUSCULAR | Status: DC | PRN
  Administered 2023-03-03 – 2023-03-07 (×3): 30 mg via INTRAVENOUS
  Filled 2023-03-03 (×4): qty 1

## 2023-03-03 NOTE — Evaluation (Signed)
Occupational Therapy Evaluation Patient Details Name: Kathleen Walsh MRN: [id number] DOB: [birth year] Today's Date: 03/03/2023   History of Present Illness 42 yo female presents to [facility] 10/31 with midsternal chest pain, SOB, LE swelling. PMH includes obesity, HLD, HTN, anemia, depression, DM, OSA.   Clinical Impression   Pt admitted for above, she has a new dx of CHF. Educated pt on CHF signs and prevention strategies as well as daily tasks to reduce the risk of worsening HF, her and her husband verbalized understanding but encouraged them to write down questions for the MD if they could think of more. Pt needing Max A to don socks but completes her other ADLs with supervision/setup, ambulating in hall with Supervision no AD and reports feeling steadier than earlier. Her BLEs continue to fatigue with prolonged standing, recommend shower seat. OT to continue to follow pt acutely to address deficits and help transition to next level of care. Anticipate no follow-up OT needed.        If plan is discharge home, recommend the following: Other (comment) (n/a)    Functional Status Assessment  Patient has had a recent decline in their functional status and demonstrates the ability to make significant improvements in function in a reasonable and predictable amount of time.  Equipment Recommendations  Tub/shower seat    Recommendations for Other Services       Precautions / Restrictions Precautions Precautions: None Restrictions Weight Bearing Restrictions: No      Mobility Bed Mobility Overal bed mobility: Modified Independent             General bed mobility comments: using bed rails to assist with bed mobiltiy, HOB elevated    Transfers Overall transfer level: Needs assistance Equipment used: None Transfers: Sit to/from Stand Sit to Stand: Supervision           General transfer comment: no AD      Balance Overall balance assessment: Mild deficits observed, not formally  tested                                         ADL either performed or assessed with clinical judgement   ADL Overall ADL's : Needs assistance/impaired Eating/Feeding: Independent;Sitting   Grooming: Standing;Supervision/safety   Upper Body Bathing: Modified independent;Sitting   Lower Body Bathing: Sitting/lateral leans;Minimal assistance   Upper Body Dressing : Sitting;Supervision/safety;Set up   Lower Body Dressing: Bed level;Maximal assistance Lower Body Dressing Details (indicate cue type and reason): don bilat socks Toilet Transfer: Supervision/safety;Ambulation Toilet Transfer Details (indicate cue type and reason): no AD Toileting- Clothing Manipulation and Hygiene: Sit to/from stand;Supervision/safety       Functional mobility during ADLs: Supervision/safety General ADL Comments: Educated pt on CHF signs and precautions. Her and her spouse verbalize understanding of them, they had questions regarding the diagnosis prognosis pending weight loss and advised them to follow-up with MD     Vision         Perception         Praxis         Pertinent Vitals/Pain Pain Assessment Pain Assessment: Faces Faces Pain Scale: Hurts a little bit Pain Location: chest Pain Descriptors / Indicators: Tightness, Aching Pain Intervention(s): Monitored during session, Limited activity within patient's tolerance     Extremity/Trunk Assessment Upper Extremity Assessment Upper Extremity Assessment: Generalized weakness   Lower Extremity Assessment Lower Extremity Assessment: Generalized weakness  Cervical / Trunk Assessment Cervical / Trunk Assessment: Normal   Communication Communication Communication: No apparent difficulties   Cognition Arousal: Alert Behavior During Therapy: WFL for tasks assessed/performed Overall Cognitive Status: Within Functional Limits for tasks assessed                                       General  Comments  VSS on RA. Husband present    Exercises     Shoulder Instructions      Home Living Family/patient expects to be discharged to:: Private residence Living Arrangements: Spouse/significant other;Children Available Help at Discharge: Family Type of Home: Mobile home Home Access: Stairs to enter;Ramped entrance Entrance Stairs-Number of Steps: ramp for her parents   Home Layout: One level     Bathroom Shower/Tub: Chief Strategy Officer: Standard     Home Equipment: Agricultural consultant (2 wheels)          Prior Functioning/Environment Prior Level of Function : Independent/Modified Independent             Mobility Comments: pt endorses one fall only, states her dog pulled her down. pt reports being independent, but does use RW as needed ADLs Comments: independent, does not work        OT Problem List: Decreased knowledge of precautions;Decreased strength      OT Treatment/Interventions: Self-care/ADL training;Balance training;Therapeutic exercise;Therapeutic activities;Patient/family education    OT Goals(Current goals can be found in the care plan section) Acute Rehab OT Goals Patient Stated Goal: To get home and take care of kids OT Goal Formulation: With patient Time For Goal Achievement: 03/17/23 Potential to Achieve Goals: Good ADL Goals Pt Will Perform Grooming: with modified independence;standing Pt Will Perform Lower Body Bathing: with modified independence;sitting/lateral leans Pt Will Transfer to Toilet: with modified independence;ambulating Pt Will Perform Tub/Shower Transfer: Tub transfer;with modified independence Additional ADL Goal #1: Pt will verbalize 3 signs/symptoms and 3 prevention strategies associated with CHF without cueing  OT Frequency: Min 1X/week    Co-evaluation              AM-PAC OT "6 Clicks" Daily Activity     Outcome Measure Help from another person eating meals?: None Help from another person taking care  of personal grooming?: A Little Help from another person toileting, which includes using toliet, bedpan, or urinal?: A Little Help from another person bathing (including washing, rinsing, drying)?: A Little Help from another person to put on and taking off regular upper body clothing?: A Little Help from another person to put on and taking off regular lower body clothing?: A Lot 6 Click Score: 18   End of Session Equipment Utilized During Treatment: Gait belt Nurse Communication: Mobility status  Activity Tolerance: Patient tolerated treatment well Patient left: in bed;with call bell/phone within reach;with family/visitor present (sitting EOB)  OT Visit Diagnosis: Muscle weakness (generalized) (M62.81)                Time: 5284-1324 OT Time Calculation (min): 33 min Charges:  OT General Charges $OT Visit: 1 Visit OT Evaluation $OT Eval Low Complexity: 1 Low OT Treatments $Therapeutic Activity: 8-22 mins  03/03/2023  AB, OTR/L  Acute Rehabilitation Services  Office: [phone]   Tristan Schroeder 03/03/2023, 5:53 PM

## 2023-03-03 NOTE — Evaluation (Signed)
Physical Therapy Evaluation Patient Details Name: Kathleen Walsh MRN: [id number] DOB: [birth year] Today's Date: 03/03/2023  History of Present Illness  42 yo female presents to [facility] 10/31 with midsternal chest pain, SOB, LE swelling. PMH includes obesity, HLD, HTN, anemia, depression, DM, OSA.  Clinical Impression   Pt presents with generalized weakness vs baseline, impaired activity tolerance. Pt to benefit from acute PT to address deficits. Pt ambulated good hallway distance with superivsion for safety, pt requesting use of RW for balance and energy conservation though able to ambulate without it. PT anticipates good progress with mobility while acute, no follow up PT needs at this time. PT to progress mobility as tolerated, and will continue to follow acutely.          If plan is discharge home, recommend the following:     Can travel by private vehicle        Equipment Recommendations None recommended by PT  Recommendations for Other Services       Functional Status Assessment Patient has had a recent decline in their functional status and demonstrates the ability to make significant improvements in function in a reasonable and predictable amount of time.     Precautions / Restrictions Precautions Precautions: None Restrictions Weight Bearing Restrictions: No      Mobility  Bed Mobility Overal bed mobility: Needs Assistance Bed Mobility: Supine to Sit, Sit to Supine     Supine to sit: Supervision, Used rails, HOB elevated Sit to supine: Supervision, Used rails, HOB elevated        Transfers Overall transfer level: Needs assistance Equipment used: None Transfers: Sit to/from Stand Sit to Stand: Supervision           General transfer comment: for safety, slow to rise    Ambulation/Gait Ambulation/Gait assistance: Supervision Gait Distance (Feet): 350 Feet Assistive device: Rolling walker (2 wheels) Gait Pattern/deviations: Step-through pattern, Decreased  stride length Gait velocity: decr     General Gait Details: cues for RW proximity, VSS throughout  Stairs            Wheelchair Mobility     Tilt Bed    Modified Rankin (Stroke Patients Only)       Balance Overall balance assessment: Mild deficits observed, not formally tested                                           Pertinent Vitals/Pain Pain Assessment Pain Assessment: Faces Faces Pain Scale: Hurts little more Pain Location: chest Pain Descriptors / Indicators: Tightness, Spasm Pain Intervention(s): Limited activity within patient's tolerance, Monitored during session, Repositioned, Other (comment) (RN aware)    Home Living Family/patient expects to be discharged to:: Private residence Living Arrangements: Spouse/significant other;Children Available Help at Discharge: Family Type of Home: Mobile home Home Access: Stairs to enter;Ramped entrance   Entrance Stairs-Number of Steps: ramp for her parents   Home Layout: One level Home Equipment: Agricultural consultant (2 wheels)      Prior Function Prior Level of Function : Independent/Modified Independent             Mobility Comments: pt endorses one fall only, states her dog pulled her down. pt reports being independent, but does use RW as needed ADLs Comments: independent, does not work     Extremity/Trunk Assessment   Upper Extremity Assessment Upper Extremity Assessment: Defer to OT evaluation    Lower  Extremity Assessment Lower Extremity Assessment: Generalized weakness    Cervical / Trunk Assessment Cervical / Trunk Assessment: Normal  Communication   Communication Communication: No apparent difficulties  Cognition Arousal: Alert Behavior During Therapy: WFL for tasks assessed/performed Overall Cognitive Status: Within Functional Limits for tasks assessed                                          General Comments      Exercises     Assessment/Plan     PT Assessment Patient needs continued PT services  PT Problem List Decreased strength;Decreased mobility;Decreased activity tolerance;Cardiopulmonary status limiting activity;Obesity       PT Treatment Interventions DME instruction;Therapeutic activities;Gait training;Therapeutic exercise;Patient/family education;Balance training;Stair training;Functional mobility training;Neuromuscular re-education    PT Goals (Current goals can be found in the Care Plan section)  Acute Rehab PT Goals Patient Stated Goal: home PT Goal Formulation: With patient Time For Goal Achievement: 03/17/23 Potential to Achieve Goals: Good    Frequency Min 1X/week     Co-evaluation               AM-PAC PT "6 Clicks" Mobility  Outcome Measure Help needed turning from your back to your side while in a flat bed without using bedrails?: None Help needed moving from lying on your back to sitting on the side of a flat bed without using bedrails?: A Little Help needed moving to and from a bed to a chair (including a wheelchair)?: A Little Help needed standing up from a chair using your arms (e.g., wheelchair or bedside chair)?: A Little Help needed to walk in hospital room?: A Little Help needed climbing 3-5 steps with a railing? : A Little 6 Click Score: 19    End of Session   Activity Tolerance: Patient tolerated treatment well Patient left: in bed;with call bell/phone within reach Nurse Communication: Mobility status PT Visit Diagnosis: Other abnormalities of gait and mobility (R26.89);Muscle weakness (generalized) (M62.81)    Time: 4098-1191 PT Time Calculation (min) (ACUTE ONLY): 20 min   Charges:   PT Evaluation $PT Eval Low Complexity: 1 Low   PT General Charges $$ ACUTE PT VISIT: 1 Visit         Marye Round, PT DPT Acute Rehabilitation Services Secure Chat Preferred  Office [phone]   Nneoma Harral E Christain [place] 03/03/2023, 1:35 PM

## 2023-03-03 NOTE — Hospital Course (Signed)
42 y.o. female with medical history significant of obesity, DM, HLD, HTN, OSA presented with increased sob and LE swelling over the past 1-2 weeks. Also reports chest pain worse lying down, improved with tilting the chin up. Also believed pain improved after drinking cold water. Reports increased LE swelling even with raising LE.   In the ED, serial trop were unremarkable. EKG was found to be nonischemic. Pt was given trial of analgesia with only limited effect. CXR notable for cardiomegaly with evidence of pulmonary edema. Pt was given a dose of IV lasix. Hospitalist consulted for consideration for admission

## 2023-03-03 NOTE — Progress Notes (Signed)
  Progress Note   Patient: Kathleen Walsh ZOX:[id number] DOB: [birth year] DOA: 03/02/2023     1 DOS: the patient was seen and examined on 03/03/2023   Brief hospital course: 42 y.o. female with medical history significant of obesity, DM, HLD, HTN, OSA presented with increased sob and LE swelling over the past 1-2 weeks. Also reports chest pain worse lying down, improved with tilting the chin up. Also believed pain improved after drinking cold water. Reports increased LE swelling even with raising LE.   In the ED, serial trop were unremarkable. EKG was found to be nonischemic. Pt was given trial of analgesia with only limited effect. CXR notable for cardiomegaly with evidence of pulmonary edema. Pt was given a dose of IV lasix. Hospitalist consulted for consideration for admission  Assessment and Plan: Sob and swelling secondary to acute on chronic diastolic chf exacerbation -Previously worked up by Pulmonary and Cardiology as outpatient with no definitive etiology that can be identified as recent as 2019 and as early as 2015 -BNP is mildly elevated -CXR with findings suggestive of cardiomegaly with pulm edema -Will cont with lasix for now, diuresing well -follow I/o and daily wt and bmet trends   Insulin dependent DM type 2 Cont on home insulin  Cont on ssi as needed -glycemic trends stable   Obstructive sleep apnea. Continue CPAP nightly   Essential hypertension Would cont with home regimen as tolerated    Morbid obesity Recommend weight loss as outpatient.   Hyperlipidemia Continue statin per home regimen  Migraines Complaining of headache this AM Will give trial of toradol and home sumatriptan   Subjective: Wanting to urinate this AM. Complaining of headache consistent with known migraines  Physical Exam: Vitals:   03/03/23 0508 03/03/23 0749 03/03/23 0925 03/03/23 1109  BP: 121/80 107/72 (!) 112/56 (!) 101/47  Pulse: 87 88    Resp: 20 18  19   Temp: 98.4 F (36.9 C)  98.3 F (36.8 C)  98.5 F (36.9 C)  TempSrc: Oral Oral  Oral  SpO2: 94% 98%  97%  Weight: (!) 158.9 kg     Height:       General exam: Awake, laying in bed, in nad Respiratory system: Normal respiratory effort, no wheezing Cardiovascular system: regular rate, s1, s2 Gastrointestinal system: Soft, nondistended, positive BS Central nervous system: CN2-12 grossly intact, strength intact Extremities: Perfused, no clubbing, BLE edema improving Skin: Normal skin turgor, no notable skin lesions seen Psychiatry: Mood normal // no visual hallucinations   Data Reviewed:  Labs reviewed: Na 137, K 3.7, Cr 0.59, WBC 11.2, Hgb 13.8, Plts 242  Family Communication: Pt in room, family not at bedside  Disposition: Status is: Inpatient Remains inpatient appropriate because: severity of illness  Planned Discharge Destination: Home     Author: Rickey Barbara, MD 03/03/2023 4:19 PM  For on call review [email or web address].

## 2023-03-03 NOTE — TOC Initial Note (Signed)
Transition of Care [facility]) - Initial/Assessment Note    Patient Details  Name: NABEEHA BADERTSCHER MRN: [id number] Date of Birth: 1980-09-28  Transition of [facility]) CM/SW Contact:    Leone Haven, RN Phone Number: 03/03/2023, 5:08 PM  Clinical Narrative:                 From home with spouse, has PCP and insurance on file, states has no HH services in place at this time, has rolling walker at home.  States spouse will transport her  home at [employer] and he is support system, states gets [facility] [place]. Pta self ambulatory.   Expected Discharge Plan: Home/Self Care Barriers to Discharge: Continued Medical Work up   Patient Goals and CMS Choice Patient states their goals for this hospitalization and ongoing recovery are:: return home   Choice offered to / list presented to : NA      Expected Discharge Plan and Services   Discharge Planning Services: CM Consult Post Acute Care Choice: NA Living arrangements for the past 2 months: Single Family Home                 DME Arranged: N/A         HH Arranged: NA          Prior Living Arrangements/Services Living arrangements for the past 2 months: Single Family Home Lives with:: Spouse Patient language and need for interpreter reviewed:: Yes Do you feel safe going back to the place where you live?: Yes      Need for Family Participation in Patient Care: Yes (Comment) Care giver support system in place?: Yes (comment) Current home services: DME (walker) Criminal Activity/Legal Involvement Pertinent to Current Situation/Hospitalization: No - Comment as needed  Activities of Daily Living   ADL Screening (condition at time of admission) Independently performs ADLs?: Yes (appropriate for developmental age) Is the patient deaf or have difficulty hearing?: No Does the patient have difficulty seeing, even when wearing glasses/contacts?: No Does the patient have difficulty concentrating, remembering, or making  decisions?: No  Permission Sought/Granted Permission sought to share information with : Case Manager Permission granted to share information with : Yes, Verbal Permission Granted              Emotional Assessment Appearance:: Appears stated age Attitude/Demeanor/Rapport: Engaged Affect (typically observed): Appropriate Orientation: : Oriented to Self, Oriented to Place, Oriented to  Time, Oriented to Situation Alcohol / Substance Use: Not Applicable Psych Involvement: No (comment)  Admission diagnosis:  CHF (congestive heart failure) (HCC) [I50.9] Acute congestive heart failure, unspecified heart failure type (HCC) [I50.9] Patient Active Problem List   Diagnosis Date Noted   CHF (congestive heart failure) (HCC) 03/02/2023   Dental abscess 12/18/2022   Submandibular abscess 12/15/2022   Pulmonary nodule 08/08/2017   OSA on CPAP 08/08/2017   Acute back pain with sciatica, left 08/14/2016   Trochanteric bursitis, left hip 08/12/2016   Impingement syndrome of left ankle 07/20/2016   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   Pain in left ankle and joints of left foot 07/13/2016   Chronic left-sided low back pain with left-sided sciatica 07/13/2016   Abnormal EKG 04/12/2016   Cardiomegaly 08/15/2013   SOB (shortness of breath) 08/15/2013   Pleuritic chest pain 08/15/2013   Postpartum care following cesarean delivery and tubal sterilization (10/13) 02/11/2013   Abdominal muscle strain 12/20/2011   Constipation 07/06/2011   Abdominal pain 07/06/2011   DIABETES MELLITUS, GESTATIONAL 04/17/2009   DYSPNEA 04/17/2009  PCP:  Practice, [facility]:   [id number] [address] [place] - [address] AT [facility] [address] [place] [address] Phone: [phone] Fax: [phone]  [facility] [address] [place] [address] Phone: [phone] Fax: [phone]  [facility]  [address] [place] - [address] [place] [address] Phone: [phone] Fax: [phone]     Social Determinants of Health (SDOH) Social History: SDOH Screenings   Food Insecurity: No Food Insecurity (03/03/2023)  Housing: Low Risk  (03/03/2023)  Transportation Needs: No Transportation Needs (03/03/2023)  Utilities: Not At Risk (03/03/2023)  Financial Resource Strain: Low Risk  (01/25/2023)   Received from [facility]  Tobacco Use: Medium Risk (03/02/2023)   SDOH Interventions:     Readmission Risk Interventions    12/21/2022   11:01 AM 12/16/2022    1:42 PM  Readmission Risk Prevention Plan  Post Dischage Appt Complete   Medication Screening Complete   Transportation Screening Complete Complete  PCP or Specialist Appt within 5-7 Days  Complete  Home Care Screening  Complete  Medication Review (RN CM)  Referral to Pharmacy

## 2023-03-04 DIAGNOSIS — I509 Heart failure, unspecified: Secondary | ICD-10-CM | POA: Diagnosis not present

## 2023-03-04 LAB — COMPREHENSIVE METABOLIC PANEL
ALT: 19 U/L (ref 0–44)
AST: 15 U/L (ref 15–41)
Albumin: 3.6 g/dL (ref 3.5–5.0)
Alkaline Phosphatase: 71 U/L (ref 38–126)
Anion gap: 12 (ref 5–15)
BUN: 19 mg/dL (ref 6–20)
CO2: 23 mmol/L (ref 22–32)
Calcium: 9.1 mg/dL (ref 8.9–10.3)
Chloride: 100 mmol/L (ref 98–111)
Creatinine, Ser: 0.65 mg/dL (ref 0.44–1.00)
GFR, Estimated: >60 mL/min (ref >60)
Glucose, Bld: 205 mg/dL — ABNORMAL HIGH (ref 70–99)
Potassium: 3.7 mmol/L (ref 3.5–5.1)
Sodium: 135 mmol/L (ref 135–145)
Total Bilirubin: 0.7 mg/dL (ref 0.3–1.2)
Total Protein: 7.2 g/dL (ref 6.5–8.1)

## 2023-03-04 LAB — GLUCOSE, CAPILLARY
Glucose-Capillary: 143 mg/dL — ABNORMAL HIGH (ref 70–99)
Glucose-Capillary: 162 mg/dL — ABNORMAL HIGH (ref 70–99)
Glucose-Capillary: 220 mg/dL — ABNORMAL HIGH (ref 70–99)
Glucose-Capillary: 211 mg/dL — ABNORMAL HIGH (ref 70–99)

## 2023-03-04 LAB — CBC
HCT: 45.9 % (ref 36.0–46.0)
Hemoglobin: 14.6 g/dL (ref 12.0–15.0)
MCH: 25.3 pg — ABNORMAL LOW (ref 26.0–34.0)
MCHC: 31.8 g/dL (ref 30.0–36.0)
MCV: 79.7 fL — ABNORMAL LOW (ref 80.0–100.0)
Platelets: 277 10*3/uL (ref 150–400)
RBC: 5.76 MIL/uL — ABNORMAL HIGH (ref 3.87–5.11)
RDW: 15.9 % — ABNORMAL HIGH (ref 11.5–15.5)
WBC: 13 10*3/uL — ABNORMAL HIGH (ref 4.0–10.5)
nRBC: 0 % (ref 0.0–0.2)

## 2023-03-04 LAB — TROPONIN I (HIGH SENSITIVITY): Troponin I (High Sensitivity): 6 ng/L (ref <18)

## 2023-03-04 MED ORDER — DOCUSATE SODIUM 100 MG PO CAPS
100.0000 mg | ORAL_CAPSULE | Freq: Two times a day (BID) | ORAL | Status: DC
  Administered 2023-03-04 – 2023-03-07 (×7): 100 mg via ORAL
  Filled 2023-03-04 (×7): qty 1

## 2023-03-04 MED ORDER — POLYETHYLENE GLYCOL 3350 17 G PO PACK
17.0000 g | PACK | Freq: Every day | ORAL | Status: DC
  Administered 2023-03-04 – 2023-03-07 (×4): 17 g via ORAL
  Filled 2023-03-04 (×3): qty 1

## 2023-03-04 NOTE — Plan of Care (Signed)

## 2023-03-04 NOTE — Progress Notes (Incomplete)
Called by nursing, patient with complaints of left-sided chest pains radiating to the back. 99/53, 99, 16, afebrile. Patient seen.  Left-sided chest pain very reproducible on chest and under left breast. EKG NSR without acute ischemic changes. Suspect musculoskeletal in origin however troponins ordered.  Oxy IR 5 mg x 1 given. Will have nursing check blood pressure in both upper arms

## 2023-03-04 NOTE — Plan of Care (Signed)
  Problem: Education: Goal: Ability to describe self-care measures that may prevent or decrease complications (Diabetes Survival Skills Education) will improve Outcome: Progressing Goal: Individualized Educational Video(s) Outcome: Progressing   Problem: Fluid Volume: Goal: Ability to maintain a balanced intake and output will improve Outcome: Progressing   Problem: Education: Goal: Knowledge of General Education information will improve Description: Including pain rating scale, medication(s)/side effects and non-pharmacologic comfort measures Outcome: Progressing   Problem: Education: Goal: Ability to demonstrate management of disease process will improve Outcome: Progressing Goal: Ability to verbalize understanding of medication therapies will improve Outcome: Progressing   Problem: Cardiac: Goal: Ability to achieve and maintain adequate cardiopulmonary perfusion will improve Outcome: Progressing

## 2023-03-04 NOTE — Progress Notes (Signed)
   03/04/23 2047  Vitals  BP 108/83  MAP (mmHg) 92  Pulse Rate (!) 102  ECG Heart Rate (!) 103  MEWS COLOR  MEWS Score Color Green  Oxygen Therapy  SpO2 100 %  MEWS Score  MEWS Temp 0  MEWS Systolic 0  MEWS Pulse 1  MEWS RR 0  MEWS LOC 0  MEWS Score 1   Pt C/O of Pain under her breast radiating toward her middle back. On-call provider Dr Tawana Scale notified. EKG was taken and read by the doctor. Dr Delton Coombes at bedside and recommended Oxy 5 IR and cardiac labs are order. Will follow up

## 2023-03-04 NOTE — Progress Notes (Signed)
  Progress Note   Patient: Kathleen Walsh YNW:[id number] DOB: May 08, 1980 DOA: 03/02/2023     2 DOS: the patient was seen and examined on 03/04/2023   Brief hospital course: 42 y.o. female with medical history significant of obesity, DM, HLD, HTN, OSA presented with increased sob and LE swelling over the past 1-2 weeks. Also reports chest pain worse lying down, improved with tilting the chin up. Also believed pain improved after drinking cold water. Reports increased LE swelling even with raising LE.   In the ED, serial trop were unremarkable. EKG was found to be nonischemic. Pt was given trial of analgesia with only limited effect. CXR notable for cardiomegaly with evidence of pulmonary edema. Pt was given a dose of IV lasix. Hospitalist consulted for consideration for admission  Assessment and Plan: Sob and swelling secondary to acute on chronic diastolic chf exacerbation -Previously worked up by Pulmonary and Cardiology as outpatient with no definitive etiology that can be identified as recent as 2019 and as early as 2015 -BNP is mildly elevated -CXR with findings suggestive of cardiomegaly with pulm edema -Will cont with lasix for now, Pt is diuresing well -Will ensure 1200cc fluid restricted diet -follow I/o and daily wt and bmet trends -recheck bmet in AM   Insulin dependent DM type 2 Cont on home insulin  Cont on ssi as needed -glycemic trends stable   Obstructive sleep apnea. Continue CPAP nightly   Essential hypertension Would cont with home regimen as tolerated    Morbid obesity Recommend weight loss as outpatient.   Hyperlipidemia Continue statin per home regimen  Migraines Complaining of headache this AM Will give trial of toradol and home sumatriptan   Subjective: reports feeling better so far  Physical Exam: Vitals:   03/04/23 0536 03/04/23 0735 03/04/23 0959 03/04/23 1100  BP: 108/63 (!) 102/52 136/80 124/76  Pulse: 90 98  92  Resp: 18 18  20   Temp: 97.8  F (36.6 C) 98.5 F (36.9 C)    TempSrc: Axillary Oral    SpO2:  100%  95%  Weight: (!) 153.1 kg     Height:       General exam: Conversant, in no acute distress Respiratory system: normal chest rise, clear, no audible wheezing Cardiovascular system: regular rhythm, s1-s2 Gastrointestinal system: Nondistended, nontender, pos BS Central nervous system: No seizures, no tremors Extremities: No cyanosis, no joint deformities Skin: No rashes, no pallor Psychiatry: Affect normal // no auditory hallucinations   Data Reviewed:  Labs reviewed: Na 135, K 3.7, Cr 0.65, WBC 13.0, Hgb 14.6  Family Communication: Pt in room, family at bedside  Disposition: Status is: Inpatient Remains inpatient appropriate because: severity of illness  Planned Discharge Destination: Home     Author: Rickey Barbara, MD 03/04/2023 2:45 PM  For on call review [email or web address].

## 2023-03-05 DIAGNOSIS — I509 Heart failure, unspecified: Secondary | ICD-10-CM | POA: Diagnosis not present

## 2023-03-05 LAB — CBC
HCT: 44.3 % (ref 36.0–46.0)
Hemoglobin: 13.9 g/dL (ref 12.0–15.0)
MCH: 25.4 pg — ABNORMAL LOW (ref 26.0–34.0)
MCHC: 31.4 g/dL (ref 30.0–36.0)
MCV: 80.8 fL (ref 80.0–100.0)
Platelets: 279 10*3/uL (ref 150–400)
RBC: 5.48 MIL/uL — ABNORMAL HIGH (ref 3.87–5.11)
RDW: 15.8 % — ABNORMAL HIGH (ref 11.5–15.5)
WBC: 12 10*3/uL — ABNORMAL HIGH (ref 4.0–10.5)
nRBC: 0 % (ref 0.0–0.2)

## 2023-03-05 LAB — COMPREHENSIVE METABOLIC PANEL
ALT: 17 U/L (ref 0–44)
AST: 12 U/L — ABNORMAL LOW (ref 15–41)
Albumin: 3.5 g/dL (ref 3.5–5.0)
Alkaline Phosphatase: 63 U/L (ref 38–126)
Anion gap: 11 (ref 5–15)
BUN: 22 mg/dL — ABNORMAL HIGH (ref 6–20)
CO2: 29 mmol/L (ref 22–32)
Calcium: 8.9 mg/dL (ref 8.9–10.3)
Chloride: 98 mmol/L (ref 98–111)
Creatinine, Ser: 0.71 mg/dL (ref 0.44–1.00)
GFR, Estimated: >60 mL/min (ref >60)
Glucose, Bld: 147 mg/dL — ABNORMAL HIGH (ref 70–99)
Potassium: 3.8 mmol/L (ref 3.5–5.1)
Sodium: 138 mmol/L (ref 135–145)
Total Bilirubin: 0.8 mg/dL (ref 0.3–1.2)
Total Protein: 7 g/dL (ref 6.5–8.1)

## 2023-03-05 LAB — GLUCOSE, CAPILLARY
Glucose-Capillary: 158 mg/dL — ABNORMAL HIGH (ref 70–99)
Glucose-Capillary: 165 mg/dL — ABNORMAL HIGH (ref 70–99)
Glucose-Capillary: 130 mg/dL — ABNORMAL HIGH (ref 70–99)
Glucose-Capillary: 187 mg/dL — ABNORMAL HIGH (ref 70–99)

## 2023-03-05 LAB — TROPONIN I (HIGH SENSITIVITY): Troponin I (High Sensitivity): 6 ng/L (ref <18)

## 2023-03-05 NOTE — Progress Notes (Signed)
Mobility Specialist Progress Note:    03/05/23 1053  Mobility  Activity Ambulated with assistance in hallway  Level of Assistance Standby assist, set-up cues, supervision of patient - no hands on  Assistive Device None  Distance Ambulated (ft) 400 ft  Activity Response Tolerated well  Mobility Referral Yes  $Mobility charge 1 Mobility  Mobility Specialist Start Time (ACUTE ONLY) 0900  Mobility Specialist Stop Time (ACUTE ONLY) 0914  Mobility Specialist Time Calculation (min) (ACUTE ONLY) 14 min   Received pt in bed having no complaints and agreeable to mobility. Pt was asymptomatic throughout ambulation and returned to room w/o fault. Left in bed w/ call bell in reach and all needs met.   Thompson Grayer Mobility Specialist  Please contact vis Secure Chat or  Rehab Office [phone]

## 2023-03-05 NOTE — Plan of Care (Signed)
  Problem: Education: Goal: Ability to describe self-care measures that may prevent or decrease complications (Diabetes Survival Skills Education) will improve Outcome: Progressing Goal: Individualized Educational Video(s) Outcome: Progressing   Problem: Fluid Volume: Goal: Ability to maintain a balanced intake and output will improve Outcome: Progressing   Problem: Pain Management: Goal: General experience of comfort will improve Outcome: Progressing   Problem: Cardiac: Goal: Ability to achieve and maintain adequate cardiopulmonary perfusion will improve Outcome: Progressing

## 2023-03-05 NOTE — Progress Notes (Signed)
  Progress Note   Patient: Kathleen Walsh WUJ:[id number] DOB: 08-Aug-1980 DOA: 03/02/2023     3 DOS: the patient was seen and examined on 03/05/2023   Brief hospital course: 42 y.o. female with medical history significant of obesity, DM, HLD, HTN, OSA presented with increased sob and LE swelling over the past 1-2 weeks. Also reports chest pain worse lying down, improved with tilting the chin up. Also believed pain improved after drinking cold water. Reports increased LE swelling even with raising LE.   In the ED, serial trop were unremarkable. EKG was found to be nonischemic. Pt was given trial of analgesia with only limited effect. CXR notable for cardiomegaly with evidence of pulmonary edema. Pt was given a dose of IV lasix. Hospitalist consulted for consideration for admission  Assessment and Plan: Sob and swelling secondary to acute on chronic diastolic chf exacerbation -Previously worked up by Pulmonary and Cardiology as outpatient with no definitive etiology that can be identified as recent as 2019 and as early as 2015 -BNP is mildly elevated -CXR with findings suggestive of cardiomegaly with pulm edema -Will cont with lasix for now, Pt is diuresing well -Continue 1200cc fluid restricted diet -follow I/o and daily wt and bmet trends -Continues to void very well -Wt on admit 158.8kg -wt today 151.6kg -recheck bmet in AM   Insulin dependent DM type 2 Cont on home insulin  Cont on ssi as needed -glycemic trends stable   Obstructive sleep apnea. Continue CPAP nightly   Essential hypertension Would cont with home regimen as tolerated    Morbid obesity Recommend weight loss as outpatient.   Hyperlipidemia Continue statin per home regimen  Migraines Complaining of headache this AM Will give trial of toradol and home sumatriptan   Subjective: Continuing to void vigorously. LE swelling improved  Physical Exam: Vitals:   03/05/23 0012 03/05/23 0522 03/05/23 0700 03/05/23 1120   BP: 121/74 102/61 (!) 97/57 (!) 104/58  Pulse: 95 90 90   Resp: 17 17 18 18   Temp:  97.7 F (36.5 C) 98.5 F (36.9 C) 98 F (36.7 C)  TempSrc:  Oral Oral Oral  SpO2: 97% 96% 96%   Weight:  (!) 151.6 kg    Height:       General exam: Conversant, in no acute distress Respiratory system: normal chest rise, clear, no audible wheezing Cardiovascular system: regular rhythm, s1-s2 Gastrointestinal system: Nondistended, nontender, pos BS Central nervous system: No seizures, no tremors Extremities: No cyanosis, no joint deformities, BLE edema improved Skin: No rashes, no pallor Psychiatry: Affect normal // no auditory hallucinations   Data Reviewed:  Labs reviewed: Na 138, K 3.8, Cr 0.71, WBC 12.0, Hgb 13.9, Plts 279  Family Communication: Pt in room, family at bedside  Disposition: Status is: Inpatient Remains inpatient appropriate because: severity of illness  Planned Discharge Destination: Home     Author: Rickey Barbara, MD 03/05/2023 4:12 PM  For on call review [email or web address].

## 2023-03-05 NOTE — Plan of Care (Signed)

## 2023-03-06 ENCOUNTER — Encounter (HOSPITAL_COMMUNITY): Payer: Self-pay | Admitting: Internal Medicine

## 2023-03-06 DIAGNOSIS — I509 Heart failure, unspecified: Secondary | ICD-10-CM | POA: Diagnosis not present

## 2023-03-06 LAB — COMPREHENSIVE METABOLIC PANEL
ALT: 20 U/L (ref 0–44)
AST: 15 U/L (ref 15–41)
Albumin: 3.5 g/dL (ref 3.5–5.0)
Alkaline Phosphatase: 67 U/L (ref 38–126)
Anion gap: 12 (ref 5–15)
BUN: 26 mg/dL — ABNORMAL HIGH (ref 6–20)
CO2: 26 mmol/L (ref 22–32)
Calcium: 9.1 mg/dL (ref 8.9–10.3)
Chloride: 99 mmol/L (ref 98–111)
Creatinine, Ser: 0.7 mg/dL (ref 0.44–1.00)
GFR, Estimated: >60 mL/min (ref >60)
Glucose, Bld: 151 mg/dL — ABNORMAL HIGH (ref 70–99)
Potassium: 3.5 mmol/L (ref 3.5–5.1)
Sodium: 137 mmol/L (ref 135–145)
Total Bilirubin: 0.9 mg/dL (ref <1.2)
Total Protein: 6.7 g/dL (ref 6.5–8.1)

## 2023-03-06 LAB — CBC
HCT: 45.9 % (ref 36.0–46.0)
Hemoglobin: 14.3 g/dL (ref 12.0–15.0)
MCH: 25.5 pg — ABNORMAL LOW (ref 26.0–34.0)
MCHC: 31.2 g/dL (ref 30.0–36.0)
MCV: 82 fL (ref 80.0–100.0)
Platelets: 286 10*3/uL (ref 150–400)
RBC: 5.6 MIL/uL — ABNORMAL HIGH (ref 3.87–5.11)
RDW: 15.9 % — ABNORMAL HIGH (ref 11.5–15.5)
WBC: 10.9 10*3/uL — ABNORMAL HIGH (ref 4.0–10.5)
nRBC: 0 % (ref 0.0–0.2)

## 2023-03-06 LAB — GLUCOSE, CAPILLARY
Glucose-Capillary: 140 mg/dL — ABNORMAL HIGH (ref 70–99)
Glucose-Capillary: 136 mg/dL — ABNORMAL HIGH (ref 70–99)
Glucose-Capillary: 155 mg/dL — ABNORMAL HIGH (ref 70–99)
Glucose-Capillary: 150 mg/dL — ABNORMAL HIGH (ref 70–99)

## 2023-03-06 MED ORDER — SPIRONOLACTONE 12.5 MG HALF TABLET
12.5000 mg | ORAL_TABLET | Freq: Every day | ORAL | Status: DC
  Administered 2023-03-06 – 2023-03-07 (×2): 12.5 mg via ORAL
  Filled 2023-03-06 (×2): qty 1

## 2023-03-06 NOTE — Progress Notes (Signed)
Mobility Specialist Progress Note:    03/06/23 1200  Mobility  Activity Ambulated independently in hallway  Level of Assistance Standby assist, set-up cues, supervision of patient - no hands on  Assistive Device None  Distance Ambulated (ft) 400 ft  Activity Response Tolerated well  Mobility Referral Yes  $Mobility charge 1 Mobility  Mobility Specialist Start Time (ACUTE ONLY) 1041  Mobility Specialist Stop Time (ACUTE ONLY) 1050  Mobility Specialist Time Calculation (min) (ACUTE ONLY) 9 min   Received pt in bed having no complaints and agreeable to mobility. Pt was asymptomatic throughout ambulation and returned to room w/o fault. Left in bed w/ call bell in reach and all needs met.   Thompson Grayer Mobility Specialist  Please contact vis Secure Chat or  Rehab Office [phone]

## 2023-03-06 NOTE — Progress Notes (Signed)
Nutrition Brief Note  RD received consult for low sodium/heart failure diet education.   Currently remains on a heart healthy/carb modified, 1.2L fluid restricted diet. Consuming 100% x 6 recorded meals.   "Heart Healthy, Consistent Carbohydrate Nutrition Therapy" handout added to AVS. Patient would benefit from ongoing outpatient nutrition therapy following acute admission. Referral order placed.   Please re-consult RD if acute nutrition related issues should arise during admission.   Drusilla Kanner, RDN, LDN Clinical Nutrition

## 2023-03-06 NOTE — Progress Notes (Signed)
Heart Failure Nurse Navigator Progress Note  PCP: Practice, [facility] PCP-Cardiologist: Jodelle Red.MD Admission Diagnosis: Acute congestive heart failure, unspecified heart failure type Admitted from: Home  Presentation:   Kathleen Walsh presented with shortness of breath with exertion, substernal chest pain,LE edema.  Chest pain is improved with leaning forward and worse when lying flat. BNP 296.4  ECHO/ LVEF: 60-65%  Clinical Course:  Past Medical History:  Diagnosis Date   Abdominal wall pain    Anemia    Anxiety    Blood dyscrasia    lupus anticoagulant during pregnancy   Complication of anesthesia    pt states became " panicky" after left shoulder surgery 10/2019   Depression    takes cymbalta    Diabetes mellitus without complication (HCC)    Headache(784.0)    migraines   Hx of lupus anticoagulant disorder    Hyperlipidemia    Hypertension    Obesity    PONV (postoperative nausea and vomiting)    Sleep apnea    USES C-PAP     Social History   Socioeconomic History   Marital status: Married    Spouse name: Not on file   Number of children: 2   Years of education: Not on file   Highest education level: Some college, no degree  Occupational History   Occupation: None  Tobacco Use   Smoking status: Former    Current packs/day: 0.75    Average packs/day: 0.8 packs/day for 23.0 years (17.3 ttl pk-yrs)    Types: Cigarettes    Start date: 05/2017   Smokeless tobacco: Never  Vaping Use   Vaping status: Never Used  Substance and Sexual Activity   Alcohol use: No    Comment: "once every three years"    Drug use: No   Sexual activity: Yes    Birth control/protection: Surgical  Other Topics Concern   Not on file  Social History Narrative   Not on file   Social Determinants of Health   Financial Resource Strain: Low Risk  (03/06/2023)   Overall Financial Resource Strain (CARDIA)    Difficulty of Paying Living Expenses: Not hard at  all  Food Insecurity: No Food Insecurity (03/03/2023)   Hunger Vital Sign    Worried About Running Out of Food in the Last Year: Never true    Ran Out of Food in the Last Year: Never true  Transportation Needs: No Transportation Needs (03/06/2023)   PRAPARE - Administrator, Civil Service (Medical): No    Lack of Transportation (Non-Medical): No  Physical Activity: Not on file  Stress: Not on file  Social Connections: Not on file   Education Assessment and Provision:  Detailed education and instructions provided on heart failure disease management including the following:  Signs and symptoms of Heart Failure When to call the physician Importance of daily weights Low sodium diet Fluid restriction Medication management Anticipated future follow-up appointments  Patient education given on each of the above topics.  Patient acknowledges understanding via teach back method and acceptance of all instructions.  Education Materials:  "Living Better With Heart Failure" Booklet, HF zone tool, & Daily Weight Tracker Tool.  Patient has scale at home: Yes Patient has pill box at home: Yes    High Risk Criteria for Readmission and/or Poor Patient Outcomes: Heart failure hospital admissions (last 6 months): 1  No Show rate: 1 Difficult social situation: None Demonstrates medication adherence: Yes Primary Language: English Literacy level: Reading, Writing &  Comprehension  Barriers of Care:   Diet & Fluid Restrictions Daily Weights Medication Compliance  Considerations/Referrals:   Referral made to Heart Failure Pharmacist Stewardship: Yes Referral made to Heart Failure CSW/NCM TOC: No Referral made to Heart & Vascular TOC clinic: Yes  Items for Follow-up on DC/TOC: Diet & Fluid Restrictions Daily Weights Medication Compliance Continued Heart Failure Education.   Roxy Horseman, RN, BSN [facility] Heart Failure Navigator Secure Chat Only

## 2023-03-06 NOTE — Progress Notes (Signed)
  Progress Note   Patient: Kathleen Walsh:[id number] DOB: March 14, 1981 DOA: 03/02/2023     4 DOS: the patient was seen and examined on 03/06/2023   Brief hospital course: 42 y.o. female with medical history significant of obesity, DM, HLD, HTN, OSA presented with increased sob and LE swelling over the past 1-2 weeks. Also reports chest pain worse lying down, improved with tilting the chin up. Also believed pain improved after drinking cold water. Reports increased LE swelling even with raising LE.   In the ED, serial trop were unremarkable. EKG was found to be nonischemic. Pt was given trial of analgesia with only limited effect. CXR notable for cardiomegaly with evidence of pulmonary edema. Pt was given a dose of IV lasix. Hospitalist consulted for consideration for admission  Assessment and Plan: Sob and swelling secondary to acute on chronic diastolic chf exacerbation -Previously worked up by Pulmonary and Cardiology as outpatient with no definitive etiology that can be identified as recent as 2019 and as early as 2015 -BNP is mildly elevated -CXR with findings suggestive of cardiomegaly with pulm edema -Will cont with lasix for now, Pt is diuresing well -Continue 1200cc fluid restricted diet -follow I/o and daily wt and bmet trends -Continues to void very well -Wt on admit 158.8kg -wt today 150.7kg -recheck bmet in AM   Insulin dependent DM type 2 Cont on home insulin  Cont on ssi as needed -glycemic trends stable   Obstructive sleep apnea. Continue CPAP nightly   Essential hypertension Would cont with home regimen as tolerated    Morbid obesity Recommend weight loss as outpatient. Consider gastric bypass as outpatient   Hyperlipidemia Continue statin per home regimen  Migraines Complaining of headache this AM Will give trial of toradol and home sumatriptan   Subjective: Still voiding well  Physical Exam: Vitals:   03/06/23 0740 03/06/23 1004 03/06/23 1125  03/06/23 1530  BP: 120/71 127/68 100/68 (!) 98/50  Pulse: 97  93 93  Resp: 17  19 17   Temp:   98.2 F (36.8 C) 97.7 F (36.5 C)  TempSrc:   Oral Oral  SpO2: 98%  96% 91%  Weight:      Height:       General exam: Awake, laying in bed, in nad Respiratory system: Normal respiratory effort, no wheezing Cardiovascular system: regular rate, s1, s2 Gastrointestinal system: Soft, nondistended, positive BS Central nervous system: CN2-12 grossly intact, strength intact Extremities: Perfused, no clubbing Skin: Normal skin turgor, no notable skin lesions seen Psychiatry: Mood normal // no visual hallucinations   Data Reviewed:  Labs reviewed: Na 137, K 3.5, Cr 0.70, WBC 10.9, Hgb 14.3, Plts 286  Family Communication: Pt in room, family at bedside  Disposition: Status is: Inpatient Remains inpatient appropriate because: severity of illness  Planned Discharge Destination: Home     Author: Rickey Barbara, MD 03/06/2023 4:46 PM  For on call review [email or web address].

## 2023-03-06 NOTE — Plan of Care (Signed)
  Problem: Education: Goal: Ability to describe self-care measures that may prevent or decrease complications (Diabetes Survival Skills Education) will improve Outcome: Progressing   Problem: Coping: Goal: Ability to adjust to condition or change in health will improve Outcome: Progressing   Problem: Fluid Volume: Goal: Ability to maintain a balanced intake and output will improve Outcome: Progressing   Problem: Nutritional: Goal: Maintenance of adequate nutrition will improve Outcome: Progressing Goal: Progress toward achieving an optimal weight will improve Outcome: Progressing

## 2023-03-06 NOTE — Plan of Care (Signed)
  Problem: Coping: Goal: Ability to adjust to condition or change in health will improve Outcome: Progressing   Problem: Fluid Volume: Goal: Ability to maintain a balanced intake and output will improve Outcome: Progressing   Problem: Health Behavior/Discharge Planning: Goal: Ability to identify and utilize available resources and services will improve Outcome: Progressing   Problem: Nutritional: Goal: Maintenance of adequate nutrition will improve Outcome: Progressing Goal: Progress toward achieving an optimal weight will improve Outcome: Progressing   Problem: Skin Integrity: Goal: Risk for impaired skin integrity will decrease Outcome: Progressing   Problem: Tissue Perfusion: Goal: Adequacy of tissue perfusion will improve Outcome: Progressing   Problem: Education: Goal: Knowledge of General Education information will improve Description: Including pain rating scale, medication(s)/side effects and non-pharmacologic comfort measures Outcome: Progressing

## 2023-03-06 NOTE — Progress Notes (Signed)
Physical Therapy Treatment Patient Details Name: Kathleen Walsh MRN: [id number] DOB: [birth year] Today's Date: 03/06/2023   History of Present Illness 42 yo female presents to [facility] 10/31 with midsternal chest pain, SOB, LE swelling. PMH includes obesity, HLD, HTN, anemia, depression, DM, OSA.    PT Comments  Pt much improved since initial PT eval. Pt now ambulating 3x/day without AD majority of time. Pt using RW at end of day for security due to bilat hip pain and L knee instability. Pt with good home set up and support. Acute PT to monitor pt 1x/wk while in hospital however will maintain on mobility team and encouraged continued ambulation with spouse minimally 3x/day.    If plan is discharge home, recommend the following:     Can travel by private vehicle        Equipment Recommendations  None recommended by PT    Recommendations for Other Services       Precautions / Restrictions Precautions Precautions: None Restrictions Weight Bearing Restrictions: No     Mobility  Bed Mobility               General bed mobility comments: pt sitting on EOB upon PT arrival    Transfers Overall transfer level: Independent Equipment used: None Transfers: Sit to/from Stand Sit to Stand: Independent           General transfer comment: no AD    Ambulation/Gait Ambulation/Gait assistance: Supervision Gait Distance (Feet): 400 Feet Assistive device: None Gait Pattern/deviations: Decreased stride length, Wide base of support Gait velocity: decr Gait velocity interpretation: >2.62 ft/sec, indicative of community ambulatory   General Gait Details: pt with noted mild SOB in which patient reports this to be normal due to being heavy set. no episodes of LOB, SPO2 >96% on RA   Stairs             Wheelchair Mobility     Tilt Bed    Modified Rankin (Stroke Patients Only)       Balance Overall balance assessment: Mild deficits observed, not formally tested                                           Cognition Arousal: Alert Behavior During Therapy: WFL for tasks assessed/performed Overall Cognitive Status: Within Functional Limits for tasks assessed                                          Exercises      General Comments General comments (skin integrity, edema, etc.): VSS on RA      Pertinent Vitals/Pain Pain Assessment Pain Assessment: No/denies pain    Home Living                          Prior Function            PT Goals (current goals can now be found in the care plan section) Acute Rehab PT Goals Patient Stated Goal: home PT Goal Formulation: With patient Time For Goal Achievement: 03/17/23 Potential to Achieve Goals: Good Progress towards PT goals: Progressing toward goals    Frequency    Min 1X/week      PT Plan      Co-evaluation  AM-PAC PT "6 Clicks" Mobility   Outcome Measure  Help needed turning from your back to your side while in a flat bed without using bedrails?: None Help needed moving from lying on your back to sitting on the side of a flat bed without using bedrails?: None Help needed moving to and from a bed to a chair (including a wheelchair)?: None Help needed standing up from a chair using your arms (e.g., wheelchair or bedside chair)?: None Help needed to walk in hospital room?: None Help needed climbing 3-5 steps with a railing? : A Little 6 Click Score: 23    End of Session   Activity Tolerance: Patient tolerated treatment well Patient left: in bed;with call bell/phone within reach (sitting EOB) Nurse Communication: Mobility status PT Visit Diagnosis: Other abnormalities of gait and mobility (R26.89);Muscle weakness (generalized) (M62.81)     Time: 1610-9604 PT Time Calculation (min) (ACUTE ONLY): 15 min  Charges:    $Gait Training: 8-22 mins PT General Charges $$ ACUTE PT VISIT: 1 Visit                     Voght Shock, PT, DPT Acute Rehabilitation Services Secure chat preferred Office #: [phone]    Iona Hansen 03/06/2023, 11:25 AM

## 2023-03-06 NOTE — Progress Notes (Signed)
   Heart Failure Stewardship Pharmacist Progress Note   PCP: Practice, [facility] PCP-Cardiologist: Jodelle Red, MD    HPI:  42 yo F with PMH of obesity, T2DM, HTN, HLD, and OSA.   Was seen by cardiology in 11/2017 with shortness of breath and chest discomfort.  Pulmonary workup to that point was unremarkable. Thought to have chronic obesity hypoventilation syndrome. Recommended additional inflammatory workup. She was supposed to follow up in 2-3 months but patient cancelled stating that she was no longer having issues.   Presented to the ED on 10/31 with chest pain, LE edema, orthopnea, and shortness of breath. CXR with cardiomegaly and possible pulmonary edema. ECHO 11/1 with EF 60-65%, no RWMA, RV normal.   Reports improvement in her LE edema and breathing. Complains of chest discomfort today on exam. Reports the swelling and shortness of breath occur every couple months. She does not weigh herself daily. Only taking hydrochlorothiazide for fluid PTA.   Current HF Medications: Diuretic: furosemide 60 mg IV BID ACE/ARB/ARNI: lisinopril 20 mg daily SGLT2i: Farxiga 10 mg daily  Prior to admission HF Medications: ACE/ARB/ARNI: lisinopril 20 mg daily SGLT2i: Farxiga 10 mg daily  Pertinent Lab Values: Serum creatinine 0.70, BUN 26, Potassium 3.5, Sodium 137, BNP 296.4, A1c 7.5   Vital Signs: Weight: 332 lbs (admission weight: 350 lbs) Blood pressure: 100-120/70s  Heart rate: 90-100s  I/O: net -2L yesterday; net -9.8L since admission  Medication Assistance / Insurance Benefits Check: Does the patient have prescription insurance?  Yes Type of insurance plan: [place] Medicaid  Outpatient Pharmacy:  Prior to admission outpatient pharmacy: [facility] Is the patient willing to use [facility] TOC pharmacy at discharge? Yes Is the patient willing to transition their outpatient pharmacy to utilize a [facility] outpatient pharmacy? No    Assessment: 1. Acute on chronic diastolic  CHF (LVEF 60-65%). NYHA class III symptoms. - Continue furosemide 60 mg IV BID. Still volume overloaded on exam. May benefit from torsemide at discharge. Strict I/Os and daily weights. Keep K>4 and Mg>2. - Continue lisinopril 20 mg daily - Consider adding spironolactone 12.5 mg daily - Continue Farxiga 10 mg daily - Consider discontinuing hydrochlorothiazide at discharge   Plan: 1) Medication changes recommended at this time: - Add spironolactone 12.5 mg daily - Stop hydrochlorothiazide at discharge  2) Patient assistance: - None pending, has [place] Medicaid - Patient has a scale at home, encouraged to complete daily weights  3)  Education  - Patient has been educated on current HF medications and potential additions to HF medication regimen - Patient verbalizes understanding that over the next few months, these medication doses may change and more medications may be added to optimize HF regimen - Patient has been educated on basic disease state pathophysiology and goals of therapy   Sharen Hones, PharmD, BCPS Heart Failure Stewardship Pharmacist Phone [phone]

## 2023-03-07 ENCOUNTER — Other Ambulatory Visit (HOSPITAL_COMMUNITY): Payer: Self-pay

## 2023-03-07 DIAGNOSIS — I509 Heart failure, unspecified: Secondary | ICD-10-CM | POA: Diagnosis not present

## 2023-03-07 LAB — COMPREHENSIVE METABOLIC PANEL
ALT: 20 U/L (ref 0–44)
AST: 19 U/L (ref 15–41)
Albumin: 3.4 g/dL — ABNORMAL LOW (ref 3.5–5.0)
Alkaline Phosphatase: 63 U/L (ref 38–126)
Anion gap: 13 (ref 5–15)
BUN: 30 mg/dL — ABNORMAL HIGH (ref 6–20)
CO2: 24 mmol/L (ref 22–32)
Calcium: 8.7 mg/dL — ABNORMAL LOW (ref 8.9–10.3)
Chloride: 99 mmol/L (ref 98–111)
Creatinine, Ser: 0.7 mg/dL (ref 0.44–1.00)
GFR, Estimated: >60 mL/min (ref >60)
Glucose, Bld: 135 mg/dL — ABNORMAL HIGH (ref 70–99)
Potassium: 3.5 mmol/L (ref 3.5–5.1)
Sodium: 136 mmol/L (ref 135–145)
Total Bilirubin: 0.7 mg/dL (ref <1.2)
Total Protein: 6.3 g/dL — ABNORMAL LOW (ref 6.5–8.1)

## 2023-03-07 LAB — CBC
HCT: 43.1 % (ref 36.0–46.0)
Hemoglobin: 13.9 g/dL (ref 12.0–15.0)
MCH: 26.2 pg (ref 26.0–34.0)
MCHC: 32.3 g/dL (ref 30.0–36.0)
MCV: 81.3 fL (ref 80.0–100.0)
Platelets: 253 10*3/uL (ref 150–400)
RBC: 5.3 MIL/uL — ABNORMAL HIGH (ref 3.87–5.11)
RDW: 15.8 % — ABNORMAL HIGH (ref 11.5–15.5)
WBC: 9.1 10*3/uL (ref 4.0–10.5)
nRBC: 0 % (ref 0.0–0.2)

## 2023-03-07 LAB — GLUCOSE, CAPILLARY
Glucose-Capillary: 137 mg/dL — ABNORMAL HIGH (ref 70–99)
Glucose-Capillary: 172 mg/dL — ABNORMAL HIGH (ref 70–99)

## 2023-03-07 MED ORDER — OXYCODONE HCL 5 MG PO TABS
5.0000 mg | ORAL_TABLET | ORAL | 0 refills | Status: DC | PRN
  Filled 2023-03-07: qty 15, 3d supply, fill #0

## 2023-03-07 MED ORDER — HYDROMORPHONE HCL 1 MG/ML IJ SOLN
0.5000 mg | Freq: Once | INTRAMUSCULAR | Status: AC
  Administered 2023-03-07: 0.5 mg via INTRAVENOUS
  Filled 2023-03-07: qty 0.5

## 2023-03-07 MED ORDER — SPIRONOLACTONE 25 MG PO TABS
25.0000 mg | ORAL_TABLET | Freq: Every day | ORAL | 0 refills | Status: DC
  Filled 2023-03-07: qty 30, 30d supply, fill #0

## 2023-03-07 MED ORDER — FUROSEMIDE 40 MG PO TABS
40.0000 mg | ORAL_TABLET | Freq: Two times a day (BID) | ORAL | 0 refills | Status: DC
  Filled 2023-03-07: qty 30, 15d supply, fill #0

## 2023-03-07 MED ORDER — SPIRONOLACTONE 25 MG PO TABS: 25.0000 mg | ORAL_TABLET | Freq: Every day | ORAL | Status: DC

## 2023-03-07 MED ORDER — SPIRONOLACTONE 12.5 MG HALF TABLET
12.5000 mg | ORAL_TABLET | Freq: Once | ORAL | Status: AC
  Administered 2023-03-07: 12.5 mg via ORAL
  Filled 2023-03-07: qty 1

## 2023-03-07 NOTE — Progress Notes (Signed)
Patient IV and Tele removed. AVS was given to pt and educated on medications, folllow up appointments and discharge instructions. Pt denies any questions or concerns. Wheel chair off unit, transporting patient home.

## 2023-03-07 NOTE — Progress Notes (Signed)
Occupational Therapy Treatment Patient Details Name: Kathleen Walsh MRN: [id number] DOB: May 12, 1980 Today's Date: 03/07/2023   History of present illness 42 yo female presents to [facility] 10/31 with midsternal chest pain, SOB, LE swelling. PMH includes obesity, HLD, HTN, anemia, depression, DM, OSA.   OT comments  Focused session on AE education for LB ADLs and other compensatory strategies to maximize independence in setting of chronic back pain and decreased flexibility. Pt verbalized understanding of all education, hopeful for DC home soon.      If plan is discharge home, recommend the following:      Equipment Recommendations  Other (comment) (sock aide, shower chair; pt to obtain after DC home)    Recommendations for Other Services      Precautions / Restrictions Precautions Precautions: None Restrictions Weight Bearing Restrictions: No       Mobility Bed Mobility               General bed mobility comments: EOB on entry    Transfers                         Balance                                           ADL either performed or assessed with clinical judgement   ADL Overall ADL's : Needs assistance/impaired                     Lower Body Dressing: Supervision/safety;Sit to/from stand;Sitting/lateral leans;With adaptive equipment Lower Body Dressing Details (indicate cue type and reason): educated on use of AE for sock mgmt w/ pt able to return demo sock aide use after education and removal with positional strategies then picking sock up from floor w/ reacher. discussed shoehorn, other positional strategies for LB dressing   Toilet Transfer Details (indicate cue type and reason): q           General ADL Comments: observed ambulating with spouse earlier today in hallway, reports feeling much better. plans to look into shower chair if needed upon DC    Extremity/Trunk Assessment Upper Extremity Assessment Upper  Extremity Assessment: Overall WFL for tasks assessed;Right hand dominant   Lower Extremity Assessment Lower Extremity Assessment: Defer to PT evaluation        Vision   Vision Assessment?: No apparent visual deficits   Perception     Praxis      Cognition Arousal: Alert Behavior During Therapy: WFL for tasks assessed/performed Overall Cognitive Status: Within Functional Limits for tasks assessed                                          Exercises      Shoulder Instructions       General Comments Husband at bedside    Pertinent Vitals/ Pain       Pain Assessment Pain Assessment: No/denies pain  Home Living                                          Prior Functioning/Environment              Frequency  Min 1X/week        Progress Toward Goals  OT Goals(current goals can now be found in the care plan section)  Progress towards OT goals: Progressing toward goals  Acute Rehab OT Goals Patient Stated Goal: home soon OT Goal Formulation: With patient Time For Goal Achievement: 03/17/23 Potential to Achieve Goals: Good ADL Goals Pt Will Perform Grooming: with modified independence;standing Pt Will Perform Lower Body Bathing: with modified independence;sitting/lateral leans Pt Will Transfer to Toilet: with modified independence;ambulating Pt Will Perform Tub/Shower Transfer: Tub transfer;with modified independence Additional ADL Goal #1: Pt will verbalize 3 signs/symptoms and 3 prevention strategies associated with CHF without cueing  Plan      Co-evaluation                 AM-PAC OT "6 Clicks" Daily Activity     Outcome Measure   Help from another person eating meals?: None Help from another person taking care of personal grooming?: None Help from another person toileting, which includes using toliet, bedpan, or urinal?: None Help from another person bathing (including washing, rinsing, drying)?: A  Little Help from another person to put on and taking off regular upper body clothing?: A Little Help from another person to put on and taking off regular lower body clothing?: A Little 6 Click Score: 21    End of Session    OT Visit Diagnosis: Muscle weakness (generalized) (M62.81)   Activity Tolerance Patient tolerated treatment well   Patient Left in bed;with call bell/phone within reach;with family/visitor present;with nursing/sitter in room   Nurse Communication          Time: 0102-7253 OT Time Calculation (min): 15 min  Charges: OT General Charges $OT Visit: 1 Visit OT Treatments $Self Care/Home Management : 8-22 mins  Bradd Canary, OTR/L Acute Rehab Services Office: [phone]   Lorre Munroe 03/07/2023, 12:57 PM

## 2023-03-07 NOTE — Progress Notes (Signed)
   Heart Failure Stewardship Pharmacist Progress Note   PCP: Practice, [facility] PCP-Cardiologist: Jodelle Red, MD    HPI:  42 yo F with PMH of obesity, T2DM, HTN, HLD, and OSA.   Was seen by cardiology in 11/2017 with shortness of breath and chest discomfort.  Pulmonary workup to that point was unremarkable. Thought to have chronic obesity hypoventilation syndrome. Recommended additional inflammatory workup. She was supposed to follow up in 2-3 months but patient cancelled stating that she was no longer having issues.   Presented to the ED on 10/31 with chest pain, LE edema, orthopnea, and shortness of breath. CXR with cardiomegaly and possible pulmonary edema. ECHO 11/1 with EF 60-65%, no RWMA, RV normal.   Reports improvement in her LE edema and breathing. Reports the swelling and shortness of breath occur every couple months. She does not weigh herself daily. Only taking hydrochlorothiazide for fluid PTA.   Current HF Medications: ACE/ARB/ARNI: lisinopril 20 mg daily MRA: spironolactone 12.5 mg daily SGLT2i: Farxiga 10 mg daily  Prior to admission HF Medications: ACE/ARB/ARNI: lisinopril 20 mg daily SGLT2i: Farxiga 10 mg daily  Pertinent Lab Values: Serum creatinine 0.70, BUN 30, Potassium 3.5, Sodium 136, BNP 296.4, A1c 7.5   Vital Signs: Weight: 334 lbs (admission weight: 350 lbs) Blood pressure: 110-130/70s  Heart rate: 90s  I/O: net -1.6L yesterday; net -11.3L since admission  Medication Assistance / Insurance Benefits Check: Does the patient have prescription insurance?  Yes Type of insurance plan: [place] Medicaid  Outpatient Pharmacy:  Prior to admission [facility] Is the patient willing to use [facility] TOC pharmacy at discharge? Yes Is the patient willing to transition their outpatient pharmacy to utilize a [facility] outpatient pharmacy? No    Assessment: 1. Acute on chronic diastolic CHF (LVEF 60-65%). NYHA class II symptoms. -  Off IV lasix. Volume improved. Consider starting torsemide at discharge. Strict I/Os and daily weights. Keep K>4 and Mg>2. - Continue lisinopril 20 mg daily - On spironolactone 12.5 mg daily - consider increasing to 25 mg daily - Continue Farxiga 10 mg daily - Consider discontinuing hydrochlorothiazide at discharge   Plan: 1) Medication changes recommended at this time: - Increase spironolactone to 25 mg daily - Stop hydrochlorothiazide at discharge  2) Patient assistance: - None pending, has [place] Medicaid - Patient has a scale at home, encouraged to complete daily weights  3)  Education  - Patient has been educated on current HF medications and potential additions to HF medication regimen - Patient verbalizes understanding that over the next few months, these medication doses may change and more medications may be added to optimize HF regimen - Patient has been educated on basic disease state pathophysiology and goals of therapy   Sharen Hones, PharmD, BCPS Heart Failure Stewardship Pharmacist Phone [phone]

## 2023-03-07 NOTE — Discharge Summary (Signed)
Physician Discharge Summary   Patient: Kathleen Walsh MRN: [id number] DOB: [birth year]  Admit date:     03/02/2023  Discharge date: 03/07/23  Discharge Physician: Rickey Barbara   PCP: Practice, [facility]   Recommendations at discharge:    Follow up with PCP in 1-2 weeks Follow up with Heart Failure Clinic as already scheduled Consider referral to discuss with Bariatric Surgeon electively  Weight on discharge: 151.6kg  Discharge Diagnoses: Principal Problem:   CHF (congestive heart failure) (HCC)  Resolved Problems:   * No resolved hospital problems. *  Hospital Course: 42 y.o. female with medical history significant of obesity, DM, HLD, HTN, OSA presented with increased sob and LE swelling over the past 1-2 weeks. Also reports chest pain worse lying down, improved with tilting the chin up. Also believed pain improved after drinking cold water. Reports increased LE swelling even with raising LE.   In the ED, serial trop were unremarkable. EKG was found to be nonischemic. Pt was given trial of analgesia with only limited effect. CXR notable for cardiomegaly with evidence of pulmonary edema. Pt was given a dose of IV lasix. Hospitalist consulted for consideration for admission  Assessment and Plan: Sob and swelling secondary to acute on chronic diastolic chf exacerbation -Previously worked up by Pulmonary and Cardiology as outpatient with no definitive etiology that can be identified as recent as 2019 and as early as 2015 -BNP is mildly elevated -CXR with findings suggestive of cardiomegaly with pulm edema -Pt diuresed well with IV lasix, net -11.3L -Recommend continue 1500cc fluid restricted diet on d/c -Will d/c with 40mg  po lasix bid with spironolactone 25mg , farxiga, lisinopril 20mg , crestor 40mg  -Wt on admit 158.8kg -wt on discharge: 151.6kg -Pt to follow up with Heart Failure Clinic   Insulin dependent DM type 2 Cont on home insulin  Cont on ssi as  needed -glycemic trends stable   Obstructive sleep apnea. Continue CPAP nightly   Essential hypertension Would cont with above regimen -BP remained stable this visit   Morbid obesity Recommend weight loss as outpatient. Consider gastric bypass as outpatient   Hyperlipidemia Continue statin per home regimen   Migraines Improved with analgesia    Consultants:  Procedures performed:   Disposition: Home Diet recommendation:  Cardiac and Carb modified diet DISCHARGE MEDICATION: Allergies as of 03/07/2023       Reactions   Amoxicillin Nausea And Vomiting   Has patient had a PCN reaction causing immediate rash, facial/tongue/throat swelling, SOB or lightheadedness with hypotension: No Has patient had a PCN reaction causing severe rash involving mucus membranes or skin necrosis: No Has patient had a PCN reaction that required hospitalization: No Has patient had a PCN reaction occurring within the last 10 years: Yes If all of the above answers are "NO", then may proceed with Cephalosporin use.   Metformin And Related Nausea And Vomiting   Ozempic (0.25 Or 0.5 Mg-dose) [semaglutide(0.25 Or 0.5mg -dos)] Nausea And Vomiting   Trulicity [dulaglutide] Nausea And Vomiting        Medication List     STOP taking these medications    amoxicillin-clavulanate 875-125 MG tablet Commonly known as: AUGMENTIN   famotidine 20 MG tablet Commonly known as: PEPCID   hydrochlorothiazide 25 MG tablet Commonly known as: HYDRODIURIL   HYDROcodone-acetaminophen 5-325 MG tablet Commonly known as: NORCO/VICODIN   potassium chloride SA 20 MEQ tablet Commonly known as: KLOR-CON M       TAKE these medications    chlorhexidine 0.12 % solution Commonly  known as: PERIDEX Use as directed 15 mLs in the mouth or throat 2 (two) times daily.   dapagliflozin propanediol 10 MG Tabs tablet Commonly known as: FARXIGA Take 10 mg by mouth daily.   furosemide 40 MG tablet Commonly known as:  Lasix Take 1 tablet (40 mg total) by mouth 2 (two) times daily.   GAS-X PO Take 1 tablet by mouth daily as needed (gas).   ibuprofen 600 MG tablet Commonly known as: ADVIL Take 1 tablet (600 mg total) by mouth every 6 (six) hours as needed for headache, mild pain or moderate pain.   Lantus SoloStar 100 UNIT/ML Solostar Pen Generic drug: insulin glargine Inject 80 Units into the skin at bedtime.   levocetirizine 5 MG tablet Commonly known as: XYZAL Take 5 mg by mouth daily as needed for allergies.   lisinopril 20 MG tablet Commonly known as: ZESTRIL Take 20 mg by mouth daily.   methocarbamol 500 MG tablet Commonly known as: ROBAXIN Take 500 mg by mouth daily as needed for muscle spasms.   NovoLOG FlexPen 100 UNIT/ML FlexPen Generic drug: insulin aspart Inject 30-50 Units into the skin 3 (three) times daily before meals. Per sliding scale   ondansetron 8 MG tablet Commonly known as: ZOFRAN TAKE 1 TABLET (8 MG TOTAL) BY MOUTH 3 (THREE) TIMES DAILY AS NEEDED FOR NAUSEA OR VOMITING.   oxyCODONE 5 MG immediate release tablet Commonly known as: Oxy IR/ROXICODONE Take 1 tablet (5 mg total) by mouth every 4 (four) hours as needed for moderate pain (pain score 4-6).   rizatriptan 10 MG disintegrating tablet Commonly known as: MAXALT-MLT Take 10 mg by mouth as needed for migraine.   rosuvastatin 40 MG tablet Commonly known as: CRESTOR Take 40 mg by mouth daily. What changed: Another medication with the same name was removed. Continue taking this medication, and follow the directions you see here.   spironolactone 25 MG tablet Commonly known as: ALDACTONE Take 1 tablet (25 mg total) by mouth daily. Start taking on: March 08, 2023        Follow-up Information     Practice, [facility]. Go on 03/16/2023.   Why: @2 :00pm Contact information: [address] [place] [address] [phone]         [facility]. Go in 16 day(s).   Specialty: Cardiology Why: Hospital Follow-Up Please bring list of medications to appointment Free Valet Parking, Entrance C, Off of [employer] information: [address] [place] [address] [phone]               Discharge Exam: Ceasar Mons Weights   03/05/23 0522 03/06/23 0510 03/07/23 0513  Weight: (!) 151.6 kg (!) 150.7 kg (!) 151.6 kg   General exam: Awake, laying in bed, in nad Respiratory system: Normal respiratory effort, no wheezing Cardiovascular system: regular rate, s1, s2 Gastrointestinal system: Soft, nondistended, positive BS Central nervous system: CN2-12 grossly intact, strength intact Extremities: Perfused, no clubbing Skin: Normal skin turgor, no notable skin lesions seen Psychiatry: Mood normal // no visual hallucinations   Condition at discharge: fair  The results of significant diagnostics from this hospitalization (including imaging, microbiology, ancillary and laboratory) are listed below for reference.   Imaging Studies: ECHOCARDIOGRAM COMPLETE  Result Date: 03/03/2023    ECHOCARDIOGRAM REPORT   Patient Name:   Kathleen Walsh Date of Exam: 03/03/2023 Medical Rec #:  [id number]      Height:  66.0 in Accession #:    [id number]     Weight:       350.3 lb Date of Birth:  1981-01-17      BSA:          2.538 m Patient Age:    42 years       BP:           112/56 mmHg Patient Gender: F              HR:           89 bpm. Exam Location:  Inpatient Procedure: 2D Echo, Cardiac Doppler, Color Doppler and Intracardiac            Opacification Agent Indications:    CHF I50.21  History:        Patient has prior history of Echocardiogram examinations, most                 recent 11/24/2017. Cardiomegaly, Signs/Symptoms:Shortness of                 Breath and Chest Pain; Risk Factors:Sleep Apnea and Former                 Smoker.  Sonographer:    Dondra Prader RVT RCS Referring Phys: Scheryl Marten January Bergthold  Sonographer  Comments: Technically challenging study due to limited acoustic windows, Technically difficult study due to poor echo windows, suboptimal parasternal window, suboptimal subcostal window, suboptimal apical window and patient is obese. Image acquisition challenging due to patient body habitus. IMPRESSIONS  1. Poor acoustic windows limit study.  2. Left ventricular ejection fraction, by estimation, is 60 to 65%. The left ventricle has normal function. The left ventricle has no regional wall motion abnormalities.  3. Right ventricular systolic function is normal. The right ventricular size is normal.  4. Trivial mitral valve regurgitation.  5. The aortic valve was not well visualized. Aortic valve regurgitation is not visualized. No stenosis. FINDINGS  Left Ventricle: Left ventricular ejection fraction, by estimation, is 60 to 65%. The left ventricle has normal function. The left ventricle has no regional wall motion abnormalities. Definity contrast agent was given IV to delineate the left ventricular  endocardial borders. The left ventricular internal cavity size was normal in size. Right Ventricle: The right ventricular size is normal. Right vetricular wall thickness was not assessed. Right ventricular systolic function is normal. Left Atrium: Left atrial size was normal in size. Right Atrium: Right atrial size was normal in size. Pericardium: There is no evidence of pericardial effusion. Mitral Valve: There is mild thickening of the mitral valve leaflet(s). Trivial mitral valve regurgitation. Tricuspid Valve: The tricuspid valve is not well visualized. Tricuspid valve regurgitation is trivial. Aortic Valve: The aortic valve was not well visualized. Aortic valve regurgitation is not visualized. No stenosis. Aortic valve mean gradient measures 4.7 mmHg. Aortic valve peak gradient measures 8.9 mmHg. Pulmonic Valve: The pulmonic valve was not well visualized. Aorta: The aortic root and ascending aorta are structurally  normal, with no evidence of dilitation. Venous: The inferior vena cava was not well visualized.  RIGHT VENTRICLE RV S prime:     12.00 cm/s TAPSE (M-mode): 2.2 cm LEFT ATRIUM             Index LA Vol (A2C):   27.6 ml 10.90 ml/m LA Vol (A4C):   16.8 ml 6.62 ml/m LA Biplane Vol: 20.9 ml 8.24 ml/m  AORTIC VALVE  PULMONIC VALVE AV Vmax:           149.00 cm/s PV Vmax:       0.96 m/s AV Vmean:          97.333 cm/s PV Peak grad:  3.7 mmHg AV VTI:            0.258 m AV Peak Grad:      8.9 mmHg AV Mean Grad:      4.7 mmHg LVOT Vmax:         99.93 cm/s LVOT Vmean:        63.267 cm/s LVOT VTI:          0.172 m LVOT/AV VTI ratio: 0.67  AORTA Ao Asc diam: 3.20 cm MITRAL VALVE MV Area (PHT): 4.31 cm    SHUNTS MV Decel Time: 176 msec    Systemic VTI: 0.17 m MV E velocity: 87.00 cm/s MV A velocity: 91.90 cm/s MV E/A ratio:  0.95 Dietrich Pates MD Electronically signed by Dietrich Pates MD Signature Date/Time: 03/03/2023/1:44:55 PM    Final    DG Chest 2 View  Result Date: 03/02/2023 CLINICAL DATA:  Midsternal chest pain EXAM: CHEST - 2 VIEW COMPARISON:  08/03/2022 FINDINGS: Evaluation is limited by body habitus. Redemonstrated increased cardiac contour. Prominence of the central pulmonary vasculature. No definite focal pulmonary opacity. Possible increased interstitial markings. No pleural effusion or pneumothorax. No acute osseous abnormality. IMPRESSION: 1. Evaluation is limited by body habitus. 2. Cardiomegaly with possible pulmonary edema. Electronically Signed   By: Wiliam Ke M.D.   On: 03/02/2023 13:06    Microbiology: Results for orders placed or performed during the hospital encounter of 12/14/22  Blood culture (routine x 2)     Status: None   Collection Time: 12/15/22  4:57 AM   Specimen: BLOOD  Result Value Ref Range Status   Specimen Description BLOOD RIGHT ANTECUBITAL  Final   Special Requests   Final    BOTTLES DRAWN AEROBIC AND ANAEROBIC Blood Culture adequate volume   Culture   Final     NO GROWTH 5 DAYS Performed at [facility], [address], [place], [place] [address]    Report Status 12/20/2022 FINAL  Final  Blood culture (routine x 2)     Status: None   Collection Time: 12/15/22  5:12 AM   Specimen: BLOOD RIGHT HAND  Result Value Ref Range Status   Specimen Description BLOOD RIGHT HAND  Final   Special Requests   Final    BOTTLES DRAWN AEROBIC AND ANAEROBIC Blood Culture adequate volume   Culture   Final    NO GROWTH 5 DAYS Performed at [facility], [address] [place], [place] [address]    Report Status 12/20/2022 FINAL  Final  MRSA Next Gen by PCR, Nasal     Status: None   Collection Time: 12/15/22  8:07 AM   Specimen: Nasal Mucosa; Nasal Swab  Result Value Ref Range Status   MRSA by PCR Next Gen NOT DETECTED NOT DETECTED Final    Comment: (NOTE) The GeneXpert MRSA Assay (FDA approved for NASAL specimens only), is one component of a comprehensive MRSA colonization surveillance program. It is not intended to diagnose MRSA infection nor to guide or monitor treatment for MRSA infections. Test performance is not FDA approved in patients less than 71 years old. Performed at [facility], [address], [place], [place] [address]   Aerobic/Anaerobic Culture w Gram Stain (surgical/deep wound)  Status: None   Collection Time: 12/17/22  5:10 PM   Specimen: Abscess  Result Value Ref Range Status   Specimen Description ABSCESS  Final   Special Requests LEFT MANDIBLE  Final   Gram Stain   Final    ABUNDANT WBC PRESENT, PREDOMINANTLY PMN MODERATE GRAM NEGATIVE RODS    Culture   Final    FEW PREVOTELLA MELANINOGENICA BETA LACTAMASE POSITIVE RARE EIKENELLA CORRODENS Usually susceptible to penicillin and other beta lactam agents,quinolones,macrolides and tetracyclines. Performed at [facility], [address] [place] [address], [place], [place] [address]    Report Status 12/20/2022 FINAL  Final    Labs: CBC: Recent Labs  Lab  03/03/23 0505 03/04/23 0840 03/05/23 0248 03/06/23 0240 03/07/23 0523  WBC 11.2* 13.0* 12.0* 10.9* 9.1  HGB 13.8 14.6 13.9 14.3 13.9  HCT 44.8 45.9 44.3 45.9 43.1  MCV 83.1 79.7* 80.8 82.0 81.3  PLT 242 277 279 286 253   Basic Metabolic Panel: Recent Labs  Lab 03/03/23 0505 03/04/23 0840 03/05/23 0248 03/06/23 0240 03/07/23 0523  NA 137 135 138 137 136  K 3.7 3.7 3.8 3.5 3.5  CL 102 100 98 99 99  CO2 24 23 29 26 24   GLUCOSE 161* 205* 147* 151* 135*  BUN 14 19 22* 26* 30*  CREATININE 0.59 0.65 0.71 0.70 0.70  CALCIUM 9.0 9.1 8.9 9.1 8.7*   Liver Function Tests: Recent Labs  Lab 03/03/23 0505 03/04/23 0840 03/05/23 0248 03/06/23 0240 03/07/23 0523  AST 17 15 12* 15 19  ALT 20 19 17 20 20   ALKPHOS 65 71 63 67 63  BILITOT 0.6 0.7 0.8 0.9 0.7  PROT 6.7 7.2 7.0 6.7 6.3*  ALBUMIN 3.4* 3.6 3.5 3.5 3.4*   CBG: Recent Labs  Lab 03/06/23 1124 03/06/23 1527 03/06/23 2112 03/07/23 0606 03/07/23 1106  GLUCAP 136* 155* 150* 137* 172*    Discharge time spent: less than 30 minutes.  Signed: Rickey Barbara, MD [facility] 03/07/2023

## 2023-03-07 NOTE — Plan of Care (Signed)

## 2023-03-07 NOTE — Progress Notes (Signed)
OT Cancellation Note  Patient Details Name: Kathleen Walsh MRN: [id number] DOB: [birth year]   Cancelled Treatment:    Reason Eval/Treat Not Completed: Other (comment) Currently with breakfast tray. Will follow up for OT session as schedule allows. Lorre Munroe 03/07/2023, 7:27 AM

## 2023-03-07 NOTE — Progress Notes (Signed)
Compliant's of chest discomfort states she experienced same symptoms today at 5pm and was told it was muscular in nature . She received EKG  at the time and oxycodone. I have administer Toradol and notified on call MD

## 2023-03-07 NOTE — TOC Transition Note (Signed)
Transition of Care [facility]) - CM/SW Discharge Note   Patient Details  Name: Kathleen Walsh MRN: [id number] Date of Birth: 12-Jun-1980  Transition of Care [facility]) CM/SW Contact:  Leone Haven, RN Phone Number: 03/07/2023, 1:20 PM   Clinical Narrative:    For dc home, has transportation, TOC to fill meds, no needs.     Barriers to Discharge: Continued Medical Work up   Patient Goals and CMS Choice   Choice offered to / list presented to : NA  Discharge Placement                         Discharge Plan and Services Additional resources added to the After Visit Summary for     Discharge Planning Services: CM Consult Post Acute Care Choice: NA          DME Arranged: N/A         HH Arranged: NA          Social Determinants of Health (SDOH) Interventions SDOH Screenings   Food Insecurity: No Food Insecurity (03/03/2023)  Housing: Low Risk  (03/06/2023)  Transportation Needs: No Transportation Needs (03/06/2023)  Utilities: Not At Risk (03/03/2023)  Alcohol Screen: Low Risk  (03/06/2023)  Financial Resource Strain: Low Risk  (03/06/2023)  Tobacco Use: Medium Risk (03/02/2023)     Readmission Risk Interventions    12/21/2022   11:01 AM 12/16/2022    1:42 PM  Readmission Risk Prevention Plan  Post Dischage Appt Complete   Medication Screening Complete   Transportation Screening Complete Complete  PCP or Specialist Appt within 5-7 Days  Complete  Home Care Screening  Complete  Medication Review (RN CM)  Referral to Pharmacy

## 2023-03-22 ENCOUNTER — Other Ambulatory Visit (HOSPITAL_COMMUNITY): Payer: Self-pay

## 2023-03-22 ENCOUNTER — Encounter (HOSPITAL_COMMUNITY): Payer: Self-pay

## 2023-03-22 ENCOUNTER — Ambulatory Visit (HOSPITAL_COMMUNITY)
Admit: 2023-03-22 | Discharge: 2023-03-22 | Disposition: A | Discharge status: Home / Self Care | Payer: Medicaid Other | Source: Ambulatory Visit | Attending: Cardiology

## 2023-03-22 VITALS — BP 126/80 | HR 91 | Wt 337.2 lb

## 2023-03-22 DIAGNOSIS — D6862 Lupus anticoagulant syndrome: Secondary | ICD-10-CM | POA: Diagnosis not present

## 2023-03-22 DIAGNOSIS — F17210 Nicotine dependence, cigarettes, uncomplicated: Secondary | ICD-10-CM | POA: Insufficient documentation

## 2023-03-22 DIAGNOSIS — Z7984 Long term (current) use of oral hypoglycemic drugs: Secondary | ICD-10-CM | POA: Insufficient documentation

## 2023-03-22 DIAGNOSIS — Z79899 Other long term (current) drug therapy: Secondary | ICD-10-CM | POA: Insufficient documentation

## 2023-03-22 DIAGNOSIS — I5032 Chronic diastolic (congestive) heart failure: Secondary | ICD-10-CM | POA: Diagnosis present

## 2023-03-22 DIAGNOSIS — Z794 Long term (current) use of insulin: Secondary | ICD-10-CM | POA: Insufficient documentation

## 2023-03-22 DIAGNOSIS — R21 Rash and other nonspecific skin eruption: Secondary | ICD-10-CM | POA: Insufficient documentation

## 2023-03-22 DIAGNOSIS — Z6841 Body Mass Index (BMI) 40.0 and over, adult: Secondary | ICD-10-CM | POA: Insufficient documentation

## 2023-03-22 DIAGNOSIS — E669 Obesity, unspecified: Secondary | ICD-10-CM | POA: Insufficient documentation

## 2023-03-22 DIAGNOSIS — I110 Hypertensive heart disease with heart failure: Secondary | ICD-10-CM | POA: Insufficient documentation

## 2023-03-22 DIAGNOSIS — E119 Type 2 diabetes mellitus without complications: Secondary | ICD-10-CM | POA: Insufficient documentation

## 2023-03-22 DIAGNOSIS — G4733 Obstructive sleep apnea (adult) (pediatric): Secondary | ICD-10-CM | POA: Diagnosis not present

## 2023-03-22 LAB — BASIC METABOLIC PANEL
Anion gap: 10 (ref 5–15)
BUN: 17 mg/dL (ref 6–20)
CO2: 25 mmol/L (ref 22–32)
Calcium: 9.1 mg/dL (ref 8.9–10.3)
Chloride: 105 mmol/L (ref 98–111)
Creatinine, Ser: 0.69 mg/dL (ref 0.44–1.00)
GFR, Estimated: >60 mL/min (ref >60)
Glucose, Bld: 158 mg/dL — ABNORMAL HIGH (ref 70–99)
Potassium: 4 mmol/L (ref 3.5–5.1)
Sodium: 140 mmol/L (ref 135–145)

## 2023-03-22 LAB — BRAIN NATRIURETIC PEPTIDE: B Natriuretic Peptide: 43.3 pg/mL (ref 0.0–100.0)

## 2023-03-22 MED ORDER — DAPAGLIFLOZIN PROPANEDIOL 10 MG PO TABS: 10.0000 mg | ORAL_TABLET | Freq: Every day | ORAL | 3 refills | Status: DC

## 2023-03-22 MED ORDER — FUROSEMIDE 40 MG PO TABS: 80.0000 mg | ORAL_TABLET | Freq: Two times a day (BID) | ORAL | 1 refills | Status: DC

## 2023-03-22 NOTE — Patient Instructions (Signed)
Medication Changes:  INCREASE LASIX (FUROSEMIDE) TO 80MG  TWICE DAILY   FARXIGA REFILLED   Lab Work:  Labs done today, your results will be available in MyChart, we will contact you for abnormal readings.  Testing/Procedures:  WILL START THE INSURANCE PROCESS FOR RIGHT HEART CATH AUTHORIZATION   Follow-Up in: 2 WEEKS AS SCHEDULED   At the [facility], you and your health needs are our priority. We have a designated team specialized in the treatment of Heart Failure. This Care Team includes your primary Heart Failure Specialized Cardiologist (physician), Advanced Practice Providers (APPs- Physician Assistants and Nurse Practitioners), and Pharmacist who all work together to provide you with the care you need, when you need it.   You may see any of the following providers on your designated Care Team at your next follow up:  Dr. Arvilla Meres Dr. Marca Ancona Dr. Dorthula Nettles Dr. Theresia Bough Tonye Becket, NP Robbie Lis, [place] [facility] [place], [place] Brynda Peon, NP [place] Lee, NP Karle Plumber, PharmD   Please be sure to bring in all your medications bottles to every appointment.   Need to Contact us:  If you have any questions or concerns before your next appointment please send [place] a message through Deputy or call our office at [phone].    TO LEAVE A MESSAGE FOR THE NURSE SELECT OPTION 2, PLEASE LEAVE A MESSAGE INCLUDING: YOUR NAME DATE OF BIRTH CALL BACK NUMBER REASON FOR CALL**this is important as we prioritize the call backs  YOU WILL RECEIVE A CALL BACK THE SAME DAY AS LONG AS YOU CALL BEFORE 4:00 PM

## 2023-03-22 NOTE — Progress Notes (Signed)
HEART & VASCULAR TRANSITION OF CARE CONSULT NOTE     Referring Physician: Dr. Rhona Leavens Primary Care: Practice, [facility] Primary Cardiologist: Jodelle Red, MD  HPI: Referred to clinic by Dr. Rhona Leavens, internal medicine, for heart failure consultation.   42 y/o female, former smoker, w/ h/o obesity, OSA on CPAP, HTN, Type 2 DM and lupus anticoagulant (in pregnancy) recently admitted w/ acute CHF.   Presented to ED 10/24 w/ complaints of 1-2 wk h/o progressive exertional dyspnea and LEE. BNP 296. CXR w/ cardiomegaly and pulmonary edema. Hs trops negative x 2. EKG NSR 94 bpm w/ nonspecific TW abnormalities. Echo limited study due to poor acoustic windows, however LVEF noted to be normal at 60-65%, RV also normal. Only trivial MR. She was diuresed 16 lb w/ IV lasix, down from admit wt of 349>>333lb. She was transitioned to PO lasix 40 mg bid + spironolactone 25mg , farxiga 10 mg, and lisinopril 20mg . Referred to [facility] clinic at d/c.  She presents today for assessment. Reports return of fluid retention post d/c. Was seen by her PCP post hospital and lasix was increased to 80 mg qam/ 40 mg qpm.   Wt today is up 4 lb from hospital d/c wt at 337 lb. C/w volume overload on exam. Denies resting dyspnea but NYHA Class II-III, confounded by obesity and deconditioning. BP controlled 126/80. EKG NSR 87 bpm. O2 sat 97% on RA.   She reports compliance w/ medications and good UOP w/ PO Lasix. Adhering to sodium and fluid restriction. Reports full nightly compliance w/ CPAP.   Has appearance of malar rash on face. She is worried she may have lupus.    Cardiac Testing  2D Echo 10/24 1. Poor acoustic windows limit study.   2. Left ventricular ejection fraction, by estimation, is 60 to 65%. The  left ventricle has normal function. The left ventricle has no regional  wall motion abnormalities.   3. Right ventricular systolic function is normal. The right ventricular  size is normal.   4.  Trivial mitral valve regurgitation.   5. The aortic valve was not well visualized. Aortic valve regurgitation  is not visualized. No stenosis.       Review of Systems: [y] = yes, [ ]  = no   General: Weight gain [Y ]; Weight loss [ ] ; Anorexia [ ] ; Fatigue [ Y]; Fever [ ] ; Chills [ ] ; Weakness [ ]   Cardiac: Chest pain/pressure [ ] ; Resting SOB [ ] ; Exertional SOB [Y ]; Orthopnea [ ] ; Pedal Edema [ ] ; Palpitations [ Y]; Syncope [ ] ; Presyncope [ ] ; Paroxysmal nocturnal dyspnea[ ]   Pulmonary: Cough [ ] ; Wheezing[ ] ; Hemoptysis[ ] ; Sputum [ ] ; Snoring [ ]   GI: Vomiting[ ] ; Dysphagia[ ] ; Melena[ ] ; Hematochezia [ ] ; Heartburn[ ] ; Abdominal pain [ ] ; Constipation [ ] ; Diarrhea [ ] ; BRBPR [ ]   GU: Hematuria[ ] ; Dysuria [ ] ; Nocturia[ ]   Vascular: Pain in legs with walking [ ] ; Pain in feet with lying flat [ ] ; Non-healing sores [ ] ; Stroke [ ] ; TIA [ ] ; Slurred speech [ ] ;  Neuro: Headaches[ ] ; Vertigo[ ] ; Seizures[ ] ; Paresthesias[ ] ;Blurred vision [ ] ; Diplopia [ ] ; Vision changes [ ]   Ortho/Skin: Arthritis [ ] ; Joint pain [ ] ; Muscle pain [ ] ; Joint swelling [ ] ; Back Pain [ ] ; Rash [ ]   Psych: Depression[ ] ; Anxiety[ ]   Heme: Bleeding problems [ ] ; Clotting disorders [ ] ; Anemia [ ]   Endocrine: Diabetes [ Y]; Thyroid dysfunction[ ]   Past Medical History:  Diagnosis Date   Abdominal wall pain    Anemia    Anxiety    Blood dyscrasia    lupus anticoagulant during pregnancy   Complication of anesthesia    pt states became " panicky" after left shoulder surgery 10/2019   Depression    takes cymbalta    Diabetes mellitus without complication (HCC)    Headache(784.0)    migraines   Hx of lupus anticoagulant disorder    Hyperlipidemia    Hypertension    Obesity    PONV (postoperative nausea and vomiting)    Sleep apnea    USES C-PAP    Current Outpatient Medications  Medication Sig Dispense Refill   Continuous Glucose Sensor (DEXCOM G7 SENSOR) MISC ONE SENSOR EVERY 10 DAYS 30  DAYS     dapagliflozin propanediol (FARXIGA) 10 MG TABS tablet Take 10 mg by mouth daily.     furosemide (LASIX) 40 MG tablet Take 1 tablet (40 mg total) by mouth 2 (two) times daily. 30 tablet 0   ibuprofen (ADVIL) 600 MG tablet Take 1 tablet (600 mg total) by mouth every 6 (six) hours as needed for headache, mild pain or moderate pain. 30 tablet 0   insulin glargine (LANTUS) 100 UNIT/ML injection Inject 80 Units into the skin at bedtime.     levocetirizine (XYZAL) 5 MG tablet Take 5 mg by mouth daily as needed for allergies.     lisinopril (ZESTRIL) 20 MG tablet Take 20 mg by mouth daily.     meloxicam (MOBIC) 15 MG tablet Take 15 mg by mouth daily.     NOVOLOG FLEXPEN 100 UNIT/ML FlexPen Inject 30-50 Units into the skin 3 (three) times daily before meals. Per sliding scale     ondansetron (ZOFRAN) 8 MG tablet TAKE 1 TABLET (8 MG TOTAL) BY MOUTH 3 (THREE) TIMES DAILY AS NEEDED FOR NAUSEA OR VOMITING. 12 tablet 19   oxyCODONE (OXY IR/ROXICODONE) 5 MG immediate release tablet Take 1 tablet (5 mg total) by mouth every 4 (four) hours as needed for moderate pain (pain score 4-6). 15 tablet 0   rizatriptan (MAXALT-MLT) 10 MG disintegrating tablet Take 10 mg by mouth as needed for migraine.     rosuvastatin (CRESTOR) 40 MG tablet Take 40 mg by mouth daily.     Simethicone (GAS-X PO) Take 1 tablet by mouth daily as needed (gas).     spironolactone (ALDACTONE) 25 MG tablet Take 1 tablet (25 mg total) by mouth daily. 30 tablet 0   No current facility-administered medications for this encounter.    Allergies  Allergen Reactions   Augmentin [Amoxicillin-Pot Clavulanate] Anaphylaxis   Amoxicillin Nausea And Vomiting    Has patient had a PCN reaction causing immediate rash, facial/tongue/throat swelling, SOB or lightheadedness with hypotension: No Has patient had a PCN reaction causing severe rash involving mucus membranes or skin necrosis: No Has patient had a PCN reaction that required  hospitalization: No Has patient had a PCN reaction occurring within the last 10 years: Yes If all of the above answers are "NO", then may proceed with Cephalosporin use.    Metformin And Related Nausea And Vomiting   Ozempic (0.25 Or 0.5 Mg-Dose) [Semaglutide(0.25 Or 0.5mg -Dos)] Nausea And Vomiting   Trulicity [Dulaglutide] Nausea And Vomiting      Social History   Socioeconomic History   Marital status: Married    Spouse name: Not on file   Number of children: 2   Years of education: Not on file  Highest education level: Some college, no degree  Occupational History   Occupation: None  Tobacco Use   Smoking status: Former    Current packs/day: 0.75    Average packs/day: 0.8 packs/day for 23.0 years (17.2 ttl pk-yrs)    Types: Cigarettes    Start date: 05/2017   Smokeless tobacco: Never  Vaping Use   Vaping status: Never Used  Substance and Sexual Activity   Alcohol use: No    Comment: "once every three years"    Drug use: No   Sexual activity: Yes    Birth control/protection: Surgical  Other Topics Concern   Not on file  Social History Narrative   Not on file   Social Determinants of Health   Financial Resource Strain: Low Risk  (03/06/2023)   Overall Financial Resource Strain (CARDIA)    Difficulty of Paying Living Expenses: Not hard at all  Food Insecurity: No Food Insecurity (03/03/2023)   Hunger Vital Sign    Worried About Running Out of Food in the Last Year: Never true    Ran Out of Food in the Last Year: Never true  Transportation Needs: No Transportation Needs (03/06/2023)   PRAPARE - Administrator, Civil Service (Medical): No    Lack of Transportation (Non-Medical): No  Physical Activity: Not on file  Stress: Not on file  Social Connections: Not on file  Intimate Partner Violence: Not on file      Family History  Problem Relation Age of Onset   Diabetes Mother    Hypertension Mother    Heart disease Mother    Diabetes Father     Hypertension Father    Colon polyps Father    Colitis Father    Irritable bowel syndrome Father    Breast cancer Maternal Grandmother    Crohn's disease Maternal Grandmother    Prostate cancer Maternal Grandfather    Clotting disorder Paternal Grandmother    Leukemia Paternal Grandfather    Irritable bowel syndrome Daughter    Stomach cancer Neg Hx    Esophageal cancer Neg Hx     Vitals:   03/22/23 0900  BP: 126/80  Pulse: 91  SpO2: 97%  Weight: (!) 153 kg (337 lb 3.2 oz)    PHYSICAL EXAM: General:  Well appearing, obese. No respiratory difficulty, ? Malar rash on face  HEENT: normal Neck: supple. Thick neck, JVD not well visualized. Carotids 2+ bilat; no bruits. No lymphadenopathy or thryomegaly appreciated. Cor: PMI nondisplaced. Regular rate & rhythm. No rubs, gallops or murmurs. Lungs: decreased BS at the bases bilaterally  Abdomen: soft, nontender, obese, distended. No hepatosplenomegaly. No bruits or masses. Good bowel sounds. Extremities: no cyanosis, clubbing, rash, edema Neuro: alert & oriented x 3, cranial nerves grossly intact. moves all 4 extremities w/o difficulty. Affect pleasant.  ECG: NSR 87 bpm, personally reviewed    ASSESSMENT & PLAN:  1. HFpEF  - Echo 11/24 limited study due to poor acoustic windows, however LVEF noted to be normal at 60-65%, RV also reported as normal. Only trivial MR - NYHA Class II-III, confounded by obesity/deconditioning  - Exam somewhat difficult due to body habitus but clearly volume up w/ evidence of peripheral edema. Wt also up from hospital d/c wt. She reports good UOP w/ PO Lasix. Will increase to 80 mg bid and check BNP/BNP today  - Continue Farxiga 10 mg daily  - Continue Spironolactone 25 mg daily  - On Lisinopril 20 mg daily. Will chest cost of Entresto and  will plan to switch next f/u if not cost prohibitive   - I think that she would benefit from RHC to help w/ volume assessment, diuretic dosing and assessment to check  for PH. See below. Also ? Possible CardioMEMs.   2. Probable Pulmonary HTN  - suspect likely some degree of PH. RVSP not accessed on recent echo (limited exam). She has numerous risk factors including likely OHS, known OSA, chronic diastolic dysfunction, remote tobacco use (?COPD). Also ? Possible lupus. Was diagnosed w/ lupus anticoagulant during previous pregnancy. She has appearance of malar rash on face  - check CTD serologies including Centromere Antibodies, Anti-Scleroderma Antibody, ANA w/ Reflex if Positive and Rheumatoid Factor - I think she would benefit from RHC (will start insurance authorization) and will schedule at next TOC f/u  - diuretics per above - continue CPAP   2. Systemic Hypertension  - controlled on current regimen  - GDMT per above - check BMP   3. OSA - reports full nightly compliance w/ CPAP   4. Obesity  Body mass index is 54.43 kg/m. - wt loss advised, unfortunately failed Ozempic due to SEs (N/V)   5. Type 2 DM  - recently Hgb A1c 7.5 - on Farxiga 10  - followed by PCP     Referred to HFSW (PCP, Medications, Transportation, ETOH Abuse, Drug Abuse, Insurance, Financial ): No  Refer to Pharmacy: Yes or No Refer to Home Health: No Refer to [facility] Clinic: Yes (for purpose of RHC/PH w/u ? Future CardioMEMs)  Refer to General Cardiology: Yes or No  Follow up in [facility] clinic in 2 wks   Robbie Lis, PA-C 03/22/2023

## 2023-03-23 LAB — ANTI-SCLERODERMA ANTIBODY: Scleroderma (Scl-70) (ENA) Antibody, IgG: <0.2 AI (ref 0.0–0.9)

## 2023-03-23 LAB — ANA W/REFLEX: Anti Nuclear Antibody (ANA): NEGATIVE

## 2023-03-23 LAB — RHEUMATOID FACTOR: Rheumatoid fact SerPl-aCnc: <10 [IU]/mL (ref <14.0)

## 2023-04-11 ENCOUNTER — Encounter (HOSPITAL_COMMUNITY): Payer: Self-pay

## 2023-04-11 ENCOUNTER — Ambulatory Visit (HOSPITAL_COMMUNITY)
Admission: RE | Admit: 2023-04-11 | Discharge: 2023-04-11 | Disposition: A | Discharge status: Home / Self Care | Payer: Medicaid Other | Source: Ambulatory Visit | Attending: Adult Health | Admitting: Adult Health

## 2023-04-11 VITALS — BP 122/76 | HR 95 | Wt 342.0 lb

## 2023-04-11 DIAGNOSIS — Z794 Long term (current) use of insulin: Secondary | ICD-10-CM | POA: Diagnosis not present

## 2023-04-11 DIAGNOSIS — R9431 Abnormal electrocardiogram [ECG] [EKG]: Secondary | ICD-10-CM | POA: Insufficient documentation

## 2023-04-11 DIAGNOSIS — G4733 Obstructive sleep apnea (adult) (pediatric): Secondary | ICD-10-CM | POA: Insufficient documentation

## 2023-04-11 DIAGNOSIS — Z862 Personal history of diseases of the blood and blood-forming organs and certain disorders involving the immune mechanism: Secondary | ICD-10-CM | POA: Diagnosis not present

## 2023-04-11 DIAGNOSIS — Z6841 Body Mass Index (BMI) 40.0 and over, adult: Secondary | ICD-10-CM | POA: Diagnosis not present

## 2023-04-11 DIAGNOSIS — E119 Type 2 diabetes mellitus without complications: Secondary | ICD-10-CM | POA: Insufficient documentation

## 2023-04-11 DIAGNOSIS — I110 Hypertensive heart disease with heart failure: Secondary | ICD-10-CM | POA: Insufficient documentation

## 2023-04-11 DIAGNOSIS — I5032 Chronic diastolic (congestive) heart failure: Secondary | ICD-10-CM | POA: Insufficient documentation

## 2023-04-11 LAB — BASIC METABOLIC PANEL
Anion gap: 10 (ref 5–15)
BUN: 9 mg/dL (ref 6–20)
CO2: 23 mmol/L (ref 22–32)
Calcium: 8.8 mg/dL — ABNORMAL LOW (ref 8.9–10.3)
Chloride: 106 mmol/L (ref 98–111)
Creatinine, Ser: 0.73 mg/dL (ref 0.44–1.00)
GFR, Estimated: >60 mL/min (ref >60)
Glucose, Bld: 213 mg/dL — ABNORMAL HIGH (ref 70–99)
Potassium: 3.6 mmol/L (ref 3.5–5.1)
Sodium: 139 mmol/L (ref 135–145)

## 2023-04-11 LAB — CBC
HCT: 43.3 % (ref 36.0–46.0)
Hemoglobin: 13.6 g/dL (ref 12.0–15.0)
MCH: 26.2 pg (ref 26.0–34.0)
MCHC: 31.4 g/dL (ref 30.0–36.0)
MCV: 83.3 fL (ref 80.0–100.0)
Platelets: 251 10*3/uL (ref 150–400)
RBC: 5.2 MIL/uL — ABNORMAL HIGH (ref 3.87–5.11)
RDW: 15.7 % — ABNORMAL HIGH (ref 11.5–15.5)
WBC: 13.8 10*3/uL — ABNORMAL HIGH (ref 4.0–10.5)
nRBC: 0 % (ref 0.0–0.2)

## 2023-04-11 LAB — BRAIN NATRIURETIC PEPTIDE: B Natriuretic Peptide: 27.3 pg/mL (ref 0.0–100.0)

## 2023-04-11 MED ORDER — TORSEMIDE 20 MG PO TABS: 40.0000 mg | ORAL_TABLET | Freq: Two times a day (BID) | ORAL | 5 refills | Status: DC

## 2023-04-11 NOTE — Addendum Note (Signed)
Encounter addended by: Laurey Morale, MD on: 04/11/2023 5:33 PM  Actions taken: Clinical Note Signed

## 2023-04-11 NOTE — Progress Notes (Addendum)
Primary Care: Practice, [facility] Family HF Cardiology: Dr. Shirlee Latch  HPI: 42 y.o. former smoker with h/o obesity, OSA on CPAP, HTN, Type 2 DM and lupus anticoagulant (in pregnancy) was referred to HF MD clinic from [facility] clinic for evaluation of diastolic CHF.    Patient presented to [facility] ED 10/24 w/ complaints of 1-2 wk history of progressive exertional dyspnea and peripheral edema. BNP 296. CXR w/ cardiomegaly and pulmonary edema. Hs-troponin negative x 2. EKG NSR 94 bpm w/ nonspecific TW abnormalities. Echo was a limited study due to poor acoustic windows, however LVEF noted to be normal at 60-65%, RV also probably normal size/function, trivial MR, unable to estimate PA systolic pressure. She was diuresed 16 lb w/ IV lasix, down from admit wt of 349>>333lb. She was transitioned to PO lasix 40 mg bid + spironolactone 25mg , farxiga 10 mg, and lisinopril 20mg . Referred to [facility] clinic at discharge.  She reports increased dyspnea over the last week.  She is short of breath walking up a hill or walking 100 yards on flat ground.  No problems walking around the house or with ADLs.  She has orthopnea and sleeps propped up.  She uses her CPAP at night.  No chest pain.  No palpitations or lightheadedness. She does have episodes of vertigo-type symptoms that sound like BPPV. Weight is up 5 lbs.   ECG (personally reviewed): NSR, nonspecific T wave flattening  Labs (11/24): BNP 43, K 4, creatinine 0.69, ANA negative, RF negative, SCL-70 negative  PMH: 1. OSA: uses CPAP 2. Morbid obesity: Unable to tolerate Ozempic 3. Lupus anticoagulant noted in pregnancy.  4. HTN 5. HFpEF: Echo (10/24) with technically difficult windows, EF 60-65%, RV probably normal, unable to esimate PA systolic pressure.  6. Type 2 diabetes  Review of Systems: All systems reviewed and negative except as per HPI.   FH: Mother with CHF, she does not know the details.   SH: Lives in [place], married, 2 children, prior  smoker but quit 2017, no ETOH. Out of work.    Current Outpatient Medications  Medication Sig Dispense Refill   Continuous Glucose Sensor (DEXCOM G7 SENSOR) MISC ONE SENSOR EVERY 10 DAYS 30 DAYS     dapagliflozin propanediol (FARXIGA) 10 MG TABS tablet Take 1 tablet (10 mg total) by mouth daily. 30 tablet 3   ibuprofen (ADVIL) 600 MG tablet Take 1 tablet (600 mg total) by mouth every 6 (six) hours as needed for headache, mild pain or moderate pain. 30 tablet 0   insulin glargine (LANTUS) 100 UNIT/ML injection Inject 85 Units into the skin at bedtime.     levocetirizine (XYZAL) 5 MG tablet Take 5 mg by mouth daily as needed for allergies.     lisinopril (ZESTRIL) 20 MG tablet Take 20 mg by mouth daily.     NOVOLOG FLEXPEN 100 UNIT/ML FlexPen Inject 30-50 Units into the skin 3 (three) times daily before meals. Per sliding scale     ondansetron (ZOFRAN) 8 MG tablet TAKE 1 TABLET (8 MG TOTAL) BY MOUTH 3 (THREE) TIMES DAILY AS NEEDED FOR NAUSEA OR VOMITING. 12 tablet 19   oxyCODONE (OXY IR/ROXICODONE) 5 MG immediate release tablet Take 1 tablet (5 mg total) by mouth every 4 (four) hours as needed for moderate pain (pain score 4-6). 15 tablet 0   rizatriptan (MAXALT-MLT) 10 MG disintegrating tablet Take 10 mg by mouth as needed for migraine.     rosuvastatin (CRESTOR) 40 MG tablet Take 40 mg by mouth daily.  Simethicone (GAS-X PO) Take 1 tablet by mouth daily as needed (gas).     spironolactone (ALDACTONE) 25 MG tablet Take 1 tablet (25 mg total) by mouth daily. 30 tablet 0   torsemide (DEMADEX) 20 MG tablet Take 2 tablets (40 mg total) by mouth 2 (two) times daily. 120 tablet 5   No current facility-administered medications for this encounter.    Allergies  Allergen Reactions   Augmentin [Amoxicillin-Pot Clavulanate] Anaphylaxis   Amoxicillin Nausea And Vomiting    Has patient had a PCN reaction causing immediate rash, facial/tongue/throat swelling, SOB or lightheadedness with hypotension:  No Has patient had a PCN reaction causing severe rash involving mucus membranes or skin necrosis: No Has patient had a PCN reaction that required hospitalization: No Has patient had a PCN reaction occurring within the last 10 years: Yes If all of the above answers are "NO", then may proceed with Cephalosporin use.    Metformin And Related Nausea And Vomiting   Ozempic (0.25 Or 0.5 Mg-Dose) [Semaglutide(0.25 Or 0.5mg -Dos)] Nausea And Vomiting   Trulicity [Dulaglutide] Nausea And Vomiting    Vitals:   04/11/23 1335  BP: 122/76  Pulse: 95  SpO2: 98%  Weight: (!) 155.1 kg (342 lb)    PHYSICAL EXAM: General: NAD, obese.  Neck: Thick, JVP difficult, no thyromegaly or thyroid nodule.  Lungs: Clear to auscultation bilaterally with normal respiratory effort. CV: Nondisplaced PMI.  Heart regular S1/S2, no S3/S4, no murmur.  Trace edema.  No carotid bruit.  Normal pedal pulses.  Abdomen: Soft, nontender, no hepatosplenomegaly, no distention.  Skin: Intact without lesions or rashes.  Neurologic: Alert and oriented x 3.  Psych: Normal affect. Extremities: No clubbing or cyanosis.  HEENT: Normal.   ASSESSMENT & PLAN: 1. HFpEF: Echo 10/24 limited study due to poor acoustic windows, however LVEF noted to be normal 60-65%, RV also reported as normal, trivial MR, unable to estimate PA systolic pressure.  She was admitted in 10/24 with CHF exacerbation and diuresed.  Now feels like she is building up fluid again despite Lasix 80 mg po bid. Exam is quite difficult for volume, but weight is up 5 lbs and she has some peripheral edema.  NYHA class III symptoms.  - I will stop Lasix and start torsemide 40 mg bid. BMET/BNP today, BMET in 10 days.  - Continue Farxiga 10 mg daily.  - Continue spironolactone 25 mg daily.  - As above, volume status very difficult to assess.  I think she would be a good Cardiomems candidate.  Will plan RHC with Cardiomems implantation if we can get insurance approval. RHC will  also allow [place] to assess PA pressure with possible OHS/OSA and pulmonary hypertension.  I discussed risks/benefits with patient and she agrees to procedure.  2. OSA: Continue CPAP. As above, think we need to assess PA pressure with RHC.  3. HTN: BP controlled.  4. Obesity: Body mass index is 55.2 kg/m.  Failed Ozempic with nausea/vomiting.  - She is willing to try [facility], I will refer to pharmacy clinic for this.   Followup in 3 wks with APP after med adjustment today.   Marca Ancona,  04/11/2023

## 2023-04-11 NOTE — Patient Instructions (Signed)
EKG done today.   Labs done today. We will contact you only if your labs are abnormal.  STOP taking Lasix  START Torsemide 40mg  (2 tablets) by mouth 2 times daily.   No other medication changes were made. Please continue all current medications as prescribed.  Your physician recommends that you schedule a follow-up appointment in: 10 days for a lab only appointment and in 3 weeks with our NP/PA Clinic here in our office.   If you have any questions or concerns before your next appointment please send [place] a message through [place] or call our office at [phone].    TO LEAVE A MESSAGE FOR THE NURSE SELECT OPTION 2, PLEASE LEAVE A MESSAGE INCLUDING: YOUR NAME DATE OF BIRTH CALL BACK NUMBER REASON FOR CALL**this is important as we prioritize the call backs  YOU WILL RECEIVE A CALL BACK THE SAME DAY AS LONG AS YOU CALL BEFORE 4:00 PM   Do the following things EVERYDAY: Weigh yourself in the morning before breakfast. Write it down and keep it in a log. Take your medicines as prescribed Eat low salt foods--Limit salt (sodium) to 2000 mg per day.  Stay as active as you can everyday Limit all fluids for the day to less than 2 liters   At the [facility], you and your health needs are our priority. As part of our continuing mission to provide you with exceptional heart care, we have created designated Provider Care Teams. These Care Teams include your primary Cardiologist (physician) and Advanced Practice Providers (APPs- Physician Assistants and Nurse Practitioners) who all work together to provide you with the care you need, when you need it.   You may see any of the following providers on your designated Care Team at your next follow up: Dr Arvilla Meres Dr Marca Ancona Dr. Marcos Eke, NP Robbie Lis, [place] [facility] [place], [place] Brynda Peon, NP Karle Plumber, PharmD   Please be sure to bring in all your medications bottles  to every appointment.    Thank you for choosing [facility]     You are scheduled for a Cardiac Catheterization on Friday, January 3 with Dr. Marca Ancona.  1. Please arrive at the [facility] (Main Entrance A) at [facility]: [address] [place], [place] [address] at 5:30 AM (This time is 2 hour(s) before your procedure to ensure your preparation).   Free valet parking service is available. You will check in at ADMITTING. The support person will be asked to wait in the waiting room.  It is OK to have someone drop you off and come back when you are ready to be discharged.    Special note: Every effort is made to have your procedure done on time. Please understand that emergencies sometimes delay scheduled procedures.  2. Diet: Do not eat solid foods after midnight.  The patient may have clear liquids until 5am upon the day of the procedure.   3. Medication instructions in preparation for your procedure:  Take only 42 units of insulin the night before your procedure. Do not take any insulin on the day of the procedure.  DO NOT TAKE FARXIGA FOR 3 DAYS BEFORE YOUR PROCEDURE.  DO NOT TAKE TORSEMIDE AND SPIRONOLACTONE THE DAY OF YOUR PROCEDURE  On the morning of your procedure, any morning medicines NOT listed above.  You may use sips of water.  4. Plan to go home the same day, you will  only stay overnight if medically necessary. 5. Bring a current list of your medications and current insurance cards. 6. You MUST have a responsible person to drive you home. 7. Someone MUST be with you the first 24 hours after you arrive home or your discharge will be delayed. 8. Please wear clothes that are easy to get on and off and wear slip-on shoes.  Thank you for allowing [place] to care for you!   -- [facility]

## 2023-04-11 NOTE — Progress Notes (Addendum)
H&V Care Navigation CSW Progress Note  Clinical Social Worker met with patient to assess for disability needs.  Patient is participating in a Managed Medicaid Plan:  Yes  CSW met with patient in [facility]. Patient expressed concerns about her ability to work and inquired about the disability process. Patient reports she worked at [employer] for 16 years and stopped working last June due to health concerns. Patient resides with her parents and two children. She reports some income from her husband who does not reside in the home with her. She has medicaid but no income. She states that he parents have been very supportive and assist financially. She also has food stamps and denies any other SDoH concerns at this time. CSW provided supportive intervention and discussed the process at length for disability through the Social Security system as she has no benefits for disability through her former employer. Patient verbalizes understanding and will follow up with the Social Security office for application. Lasandra Beech, LCSW, CCSW-MCS [phone]   SDOH Screenings   Food Insecurity: No Food Insecurity (03/03/2023)  Housing: Low Risk  (03/06/2023)  Transportation Needs: No Transportation Needs (03/06/2023)  Utilities: Not At Risk (03/03/2023)  Alcohol Screen: Low Risk  (03/06/2023)  Financial Resource Strain: Low Risk  (04/11/2023)  Tobacco Use: Medium Risk (04/11/2023)  Health Literacy: Adequate Health Literacy (04/11/2023)

## 2023-04-18 ENCOUNTER — Other Ambulatory Visit: Payer: Self-pay | Admitting: Internal Medicine

## 2023-04-21 ENCOUNTER — Ambulatory Visit (HOSPITAL_COMMUNITY)
Admission: RE | Admit: 2023-04-21 | Discharge: 2023-04-21 | Disposition: A | Discharge status: Home / Self Care | Payer: Medicaid Other | Source: Ambulatory Visit | Attending: Cardiology | Admitting: Cardiology

## 2023-04-21 DIAGNOSIS — I5032 Chronic diastolic (congestive) heart failure: Secondary | ICD-10-CM | POA: Insufficient documentation

## 2023-04-21 LAB — BASIC METABOLIC PANEL
Anion gap: 13 (ref 5–15)
BUN: 18 mg/dL (ref 6–20)
CO2: 23 mmol/L (ref 22–32)
Calcium: 8.8 mg/dL — ABNORMAL LOW (ref 8.9–10.3)
Chloride: 100 mmol/L (ref 98–111)
Creatinine, Ser: 0.77 mg/dL (ref 0.44–1.00)
GFR, Estimated: >60 mL/min (ref >60)
Glucose, Bld: 196 mg/dL — ABNORMAL HIGH (ref 70–99)
Potassium: 4 mmol/L (ref 3.5–5.1)
Sodium: 136 mmol/L (ref 135–145)

## 2023-05-05 ENCOUNTER — Other Ambulatory Visit: Payer: Self-pay

## 2023-05-05 ENCOUNTER — Ambulatory Visit (HOSPITAL_COMMUNITY)
Admission: RE | Admit: 2023-05-05 | Discharge: 2023-05-05 | Disposition: A | Discharge status: Home / Self Care | Payer: Medicaid Other | Attending: Cardiology | Admitting: Cardiology

## 2023-05-05 ENCOUNTER — Ambulatory Visit (HOSPITAL_COMMUNITY)
Admission: RE | Disposition: A | Discharge status: Home / Self Care | Payer: Self-pay | Source: Home / Self Care | Attending: Cardiology

## 2023-05-05 DIAGNOSIS — Z87891 Personal history of nicotine dependence: Secondary | ICD-10-CM | POA: Insufficient documentation

## 2023-05-05 DIAGNOSIS — E119 Type 2 diabetes mellitus without complications: Secondary | ICD-10-CM | POA: Insufficient documentation

## 2023-05-05 DIAGNOSIS — G4733 Obstructive sleep apnea (adult) (pediatric): Secondary | ICD-10-CM | POA: Diagnosis not present

## 2023-05-05 DIAGNOSIS — Z79899 Other long term (current) drug therapy: Secondary | ICD-10-CM | POA: Insufficient documentation

## 2023-05-05 DIAGNOSIS — I110 Hypertensive heart disease with heart failure: Secondary | ICD-10-CM | POA: Diagnosis present

## 2023-05-05 DIAGNOSIS — I272 Pulmonary hypertension, unspecified: Secondary | ICD-10-CM | POA: Diagnosis not present

## 2023-05-05 DIAGNOSIS — Z794 Long term (current) use of insulin: Secondary | ICD-10-CM | POA: Diagnosis not present

## 2023-05-05 DIAGNOSIS — I5032 Chronic diastolic (congestive) heart failure: Secondary | ICD-10-CM | POA: Insufficient documentation

## 2023-05-05 DIAGNOSIS — Z6841 Body Mass Index (BMI) 40.0 and over, adult: Secondary | ICD-10-CM | POA: Insufficient documentation

## 2023-05-05 DIAGNOSIS — I509 Heart failure, unspecified: Secondary | ICD-10-CM

## 2023-05-05 HISTORY — PX: RIGHT HEART CATH: CATH118263

## 2023-05-05 LAB — POCT I-STAT EG7
Acid-base deficit: 1 mmol/L (ref 0.0–2.0)
Acid-base deficit: 1 mmol/L (ref 0.0–2.0)
Bicarbonate: 24.4 mmol/L (ref 20.0–28.0)
Bicarbonate: 24.5 mmol/L (ref 20.0–28.0)
Calcium, Ion: 1.21 mmol/L (ref 1.15–1.40)
Calcium, Ion: 1.21 mmol/L (ref 1.15–1.40)
HCT: 38 % (ref 36.0–46.0)
HCT: 38 % (ref 36.0–46.0)
Hemoglobin: 12.9 g/dL (ref 12.0–15.0)
Hemoglobin: 12.9 g/dL (ref 12.0–15.0)
O2 Saturation: 62 %
O2 Saturation: 61 %
Potassium: 4.1 mmol/L (ref 3.5–5.1)
Potassium: 4.1 mmol/L (ref 3.5–5.1)
Sodium: 138 mmol/L (ref 135–145)
Sodium: 138 mmol/L (ref 135–145)
TCO2: 26 mmol/L (ref 22–32)
TCO2: 26 mmol/L (ref 22–32)
pCO2, Ven: 40.9 mm[Hg] — ABNORMAL LOW (ref 44–60)
pCO2, Ven: 41.4 mm[Hg] — ABNORMAL LOW (ref 44–60)
pH, Ven: 7.384 (ref 7.25–7.43)
pH, Ven: 7.381 (ref 7.25–7.43)
pO2, Ven: 33 mm[Hg] (ref 32–45)
pO2, Ven: 32 mm[Hg] (ref 32–45)

## 2023-05-05 LAB — GLUCOSE, CAPILLARY
Glucose-Capillary: 171 mg/dL — ABNORMAL HIGH (ref 70–99)
Glucose-Capillary: 185 mg/dL — ABNORMAL HIGH (ref 70–99)

## 2023-05-05 SURGERY — RIGHT HEART CATH
Anesthesia: LOCAL

## 2023-05-05 MED ORDER — SODIUM CHLORIDE 0.9 % IV SOLN: INTRAVENOUS | Status: DC

## 2023-05-05 MED ORDER — SODIUM CHLORIDE 0.9% FLUSH: 3.0000 mL | INTRAVENOUS | Status: DC | PRN

## 2023-05-05 MED ORDER — TORSEMIDE 20 MG PO TABS: 60.0000 mg | ORAL_TABLET | Freq: Two times a day (BID) | ORAL | 5 refills | Status: DC

## 2023-05-05 MED ORDER — ACETAMINOPHEN 325 MG PO TABS
650.0000 mg | ORAL_TABLET | ORAL | Status: DC | PRN
  Administered 2023-05-05: 650 mg via ORAL
  Filled 2023-05-05: qty 2

## 2023-05-05 MED ORDER — HEPARIN (PORCINE) IN NACL 1000-0.9 UT/500ML-% IV SOLN
INTRAVENOUS | Status: DC | PRN
  Administered 2023-05-05: 1500 mL

## 2023-05-05 MED ORDER — LIDOCAINE HCL (PF) 1 % IJ SOLN
INTRAMUSCULAR | Status: DC | PRN
  Administered 2023-05-05: 5 mL
  Administered 2023-05-05: 20 mL
  Administered 2023-05-05: 10 mL

## 2023-05-05 MED ORDER — LIDOCAINE HCL (PF) 1 % IJ SOLN
INTRAMUSCULAR | Status: AC
  Filled 2023-05-05: qty 30

## 2023-05-05 MED ORDER — MIDAZOLAM HCL 2 MG/2ML IJ SOLN
INTRAMUSCULAR | Status: DC | PRN
  Administered 2023-05-05 (×2): 1 mg via INTRAVENOUS

## 2023-05-05 MED ORDER — HYDRALAZINE HCL 20 MG/ML IJ SOLN: 10.0000 mg | INTRAMUSCULAR | Status: AC | PRN

## 2023-05-05 MED ORDER — FENTANYL CITRATE (PF) 100 MCG/2ML IJ SOLN
INTRAMUSCULAR | Status: DC | PRN
  Administered 2023-05-05 (×2): 50 ug via INTRAVENOUS

## 2023-05-05 MED ORDER — LABETALOL HCL 5 MG/ML IV SOLN
10.0000 mg | INTRAVENOUS | Status: AC | PRN
Start: 2023-05-05 — End: 2023-05-05

## 2023-05-05 MED ORDER — ONDANSETRON HCL 4 MG/2ML IJ SOLN: 4.0000 mg | Freq: Four times a day (QID) | INTRAMUSCULAR | Status: DC | PRN

## 2023-05-05 MED ORDER — POTASSIUM CHLORIDE CRYS ER 10 MEQ PO TBCR: 20.0000 meq | EXTENDED_RELEASE_TABLET | Freq: Every day | ORAL | 6 refills | Status: DC

## 2023-05-05 MED ORDER — FENTANYL CITRATE (PF) 100 MCG/2ML IJ SOLN
INTRAMUSCULAR | Status: AC
  Filled 2023-05-05: qty 2

## 2023-05-05 MED ORDER — SODIUM CHLORIDE 0.9% FLUSH: 3.0000 mL | Freq: Two times a day (BID) | INTRAVENOUS | Status: DC

## 2023-05-05 MED ORDER — SODIUM CHLORIDE 0.9 % IV SOLN: 250.0000 mL | INTRAVENOUS | Status: DC | PRN

## 2023-05-05 MED ORDER — MIDAZOLAM HCL 2 MG/2ML IJ SOLN
INTRAMUSCULAR | Status: AC
  Filled 2023-05-05: qty 2

## 2023-05-05 SURGICAL SUPPLY — 17 items
CATH BALLN WEDGE 5F 110CM (CATHETERS) IMPLANT
CATH SWAN GANZ 7F STRAIGHT (CATHETERS) IMPLANT
KIT MICROPUNCTURE NIT STIFF (SHEATH) IMPLANT
KIT SYRINGE INJ CVI SPIKEX1 (MISCELLANEOUS) IMPLANT
PACK CARDIAC CATHETERIZATION (CUSTOM PROCEDURE TRAY) ×1 IMPLANT
PROTECTION STATION PRESSURIZED (MISCELLANEOUS) ×1 IMPLANT
SHEATH FAST CATH 12F 12CM (SHEATH) IMPLANT
SHEATH GLIDE SLENDER 4/5FR (SHEATH) IMPLANT
SHEATH PINNACLE 5F 10CM (SHEATH) IMPLANT
SHEATH PINNACLE 6F 10CM (SHEATH) IMPLANT
SHEATH PINNACLE 7F 10CM (SHEATH) IMPLANT
SHEATH PINNACLE 8F 10CM (SHEATH) IMPLANT
SHEATH PINNACLE 9F 10CM (SHEATH) IMPLANT
SHEATH PROBE COVER 6X72 (BAG) IMPLANT
STATION PROTECTION PRESSURIZED (MISCELLANEOUS) IMPLANT
WIRE EMERALD 3MM-J .025X260CM (WIRE) IMPLANT
WIRE NITREX .018X300 STIFF (WIRE) IMPLANT

## 2023-05-05 NOTE — Interval H&P Note (Signed)
 History and Physical Interval Note:  05/05/2023 8:27 AM  Kathleen Walsh  has presented today for surgery, with the diagnosis of chf.  The various methods of treatment have been discussed with the patient and family. After consideration of risks, benefits and other options for treatment, the patient has consented to  Procedure(s): RIGHT HEART CATH (N/A) PRESSURE SENSOR/CARDIOMEMS (N/A) as a surgical intervention.  The patient's history has been reviewed, patient examined, no change in status, stable for surgery.  I have reviewed the patient's chart and labs.  Questions were answered to the patient's satisfaction.     Torion Hulgan [employer]

## 2023-05-05 NOTE — Discharge Instructions (Addendum)
 Femoral Site Care This sheet gives you information about how to care for yourself after your procedure. Your health care provider may also give you more specific instructions. If you have problems or questions, contact your health care provider. What can I expect after the procedure?  After the procedure, it is common to have: Bruising that usually fades within 1-2 weeks. Tenderness at the site. Follow these instructions at home: Wound care Follow instructions from your health care provider about how to take care of your insertion site. Make sure you: Wash your hands with soap and water  before you change your bandage (dressing). If soap and water  are not available, use hand sanitizer. Remove your dressing as told by your health care provider. In 24 hours Do not take baths, swim, or use a hot tub until your health care provider approves. You may shower 24-48 hours after the procedure or as told by your health care provider. Gently wash the site with plain soap and water . Pat the area dry with a clean towel. Do not rub the site. This may cause bleeding. Do not apply powder or lotion to the site. Keep the site clean and dry. Check your femoral site every day for signs of infection. Check for: Redness, swelling, or pain. Fluid or blood. Warmth. Pus or a bad smell. Activity For the first 2-3 days after your procedure, or as long as directed: Avoid climbing stairs as much as possible. Do not squat. Do not lift anything that is heavier than 10 lb (4.5 kg), or the limit that you are told, until your health care provider says that it is safe. For 5 days Rest as directed. Avoid sitting for a long time without moving. Get up to take short walks every 1-2 hours. Do not drive for 24 hours if you were given a medicine to help you relax (sedative). General instructions Take over-the-counter and prescription medicines only as told by your health care provider. Keep all follow-up visits as told by  your health care provider. This is important. Contact a health care provider if you have: A fever or chills. You have redness, swelling, or pain around your insertion site. Get help right away if: The catheter insertion area swells very fast. You pass out. You suddenly start to sweat or your skin gets clammy. The catheter insertion area is bleeding, and the bleeding does not stop when you hold steady pressure on the area. The area near or just beyond the catheter insertion site becomes pale, cool, tingly, or numb. These symptoms may represent a serious problem that is an emergency. Do not wait to see if the symptoms will go away. Get medical help right away. Call your local emergency services (911 in the [place] Do not drive yourself to the hospital. Summary After the procedure, it is common to have bruising that usually fades within 1-2 weeks. Check your femoral site every day for signs of infection. Do not lift anything that is heavier than 10 lb (4.5 kg), or the limit that you are told, until your health care provider says that it is safe. This information is not intended to replace advice given to you by your health care provider. Make sure you discuss any questions you have with your health care provider. Document Revised: 05/01/2017 Document Reviewed: 05/01/2017 Elsevier Patient Education  2020 Elsevier Inc.Brachial Site Care   This sheet gives you information about how to care for yourself after your procedure. Your health care provider may also give you more specific  instructions. If you have problems or questions, contact your health care provider. What can I expect after the procedure? After the procedure, it is common to have: Bruising and tenderness at the catheter insertion area. Follow these instructions at home:  Insertion site care Follow instructions from your health care provider about how to take care of your insertion site. Make sure you: Wash your hands with soap and  water  before you change your bandage (dressing). If soap and water  are not available, use hand sanitizer. Remove your dressing as told by your health care provider. In 24 hours Check your insertion site every day for signs of infection. Check for: Redness, swelling, or pain. Pus or a bad smell. Warmth. You may shower 24-48 hours after the procedure. Do not apply powder or lotion to the site.  Activity For 24 hours after the procedure, or as directed by your health care provider: Do not push or pull heavy objects with the affected arm. Do not drive yourself home from the hospital or clinic. You may drive 24 hours after the procedure unless your health care provider tells you not to. Do not lift anything that is heavier than 10 lb (4.5 kg), or the limit that you are told, until your health care provider says that it is safe.  For 24 hours   1. Increase torsemide  to 60 mg (3 tabs) twice a day.  2. Will start potassium pill once a day.

## 2023-05-05 NOTE — Progress Notes (Signed)
 Patient walked to the bathroom without difficulties. Bilateral groin sites level 0, clean, dry, and intact. Right brachial site level 0, clean, dry intact as well .

## 2023-05-08 ENCOUNTER — Encounter (HOSPITAL_COMMUNITY): Payer: Self-pay | Admitting: Cardiology

## 2023-05-09 MED FILL — Lidocaine HCl Local Preservative Free (PF) Inj 1%: INTRAMUSCULAR | Qty: 30 | Status: AC

## 2023-05-10 NOTE — Progress Notes (Addendum)
 ADVANCED HF CLINIC NOTE  Primary Care: Practice, [facility] Primary Cardiologist: Shelda Bruckner, MD HF Cardiology: Dr. Rolan  Chief Complaint: Heart Failure follow up  HPI: 43 y.o. former smoker with h/o obesity, OSA on CPAP, HTN, Type 2 DM and lupus anticoagulant (in pregnancy) was referred to HF MD clinic from [facility] clinic for evaluation of diastolic CHF.    Patient presented to [facility] ED 10/24 w/ complaints of 1-2 wk history of progressive exertional dyspnea and peripheral edema. BNP 296. CXR w/ cardiomegaly and pulmonary edema. Hs-troponin negative x 2. EKG NSR 94 bpm w/ nonspecific TW abnormalities. Echo was a limited study due to poor acoustic windows, however LVEF noted to be normal at 60-65%, RV also probably normal size/function, trivial MR, unable to estimate PA systolic pressure. She was diuresed 16 lb w/ IV lasix , down from admit wt of 349>>333lb. She was transitioned to PO lasix  40 mg bid, spironolactone  25mg , farxiga  10 mg, and lisinopril  20mg . Referred to [facility] clinic at discharge.  04/11/23: She reports increased dyspnea over the last week.  She is short of breath walking up a hill or walking 100 yards on flat ground.  No problems walking around the house or with ADLs.  She has orthopnea and sleeps propped up.  She uses her CPAP at night.  No chest pain.  No palpitations or lightheadedness. She does have episodes of vertigo-type symptoms that sound like BPPV. Weight is up 5 lbs.   S/p RHC 05/05/23 with R > L filling pressures and mild pHTN, persevered CO but low PAPi. Unable to successfully place cardiomems d/t difficult access. Torsemide  increased.    Today she returns for HF follow up. Overall feeling down. She has been having fatigue and pain in her knees related to her size that has been limiting her. Has some dizziness although more related to her vertigo. SOB with excessive exertion. Appetite okay. States that she is feeling depressed lately, he  ex-husband has been having issues and temporarily has moved into her home. She is tearful during the visit. She has been referred to psychiatry for medication management by her PCP. She will try to make a new patient appointment with them. Weight at home is up. Taking all medications.   ECG (personally reviewed): NSR 95 bpm  Labs (11/24): BNP 43, K 4, creatinine 0.69, ANA negative, RF negative, SCL-70 negative Labs (12/24): K 4, creatinine 0.77  PMH: 1. OSA: uses CPAP 2. Morbid obesity: Unable to tolerate Ozempic 3. Lupus anticoagulant noted in pregnancy.  4. HTN 5. HFpEF:  - Echo (10/24) with technically difficult windows, EF 60-65%, RV probably normal, unable to esimate PA systolic pressure.  - RHC (1/25): RA 13, PA 37/21 (30), PCWP 17, CO/CI (fick) 5.76/2.32, PVR 2.25, PAPi 1.2. 6. Type 2 diabetes  Review of Systems: All systems reviewed and negative except as per HPI.   FH: Mother with CHF, she does not know the details.   SH: Lives in [place], married, 2 children, prior smoker but quit 2017, no ETOH. Out of work.   Current Outpatient Medications  Medication Sig Dispense Refill   dapagliflozin  propanediol (FARXIGA ) 10 MG TABS tablet Take 1 tablet (10 mg total) by mouth daily. 30 tablet 3   famotidine  (PEPCID ) 20 MG tablet Take 20 mg by mouth daily as needed for heartburn or indigestion.     insulin  glargine (LANTUS ) 100 UNIT/ML injection Inject 85 Units into the skin at bedtime.     levocetirizine (XYZAL) 5 MG tablet  Take 5 mg by mouth daily as needed for allergies.     lisinopril  (ZESTRIL ) 20 MG tablet Take 20 mg by mouth daily.     NOVOLOG  FLEXPEN 100 UNIT/ML FlexPen Inject 20-50 Units into the skin 3 (three) times daily before meals. Per sliding scale     ondansetron  (ZOFRAN ) 8 MG tablet TAKE 1 TABLET (8 MG TOTAL) BY MOUTH 3 (THREE) TIMES DAILY AS NEEDED FOR NAUSEA OR VOMITING. 12 tablet 19   oxyCODONE  (OXY IR/ROXICODONE ) 5 MG immediate release tablet Take 1 tablet (5 mg total)  by mouth every 4 (four) hours as needed for moderate pain (pain score 4-6). 15 tablet 0   potassium chloride  (KLOR-CON  M) 10 MEQ tablet Take 2 tablets (20 mEq total) by mouth daily. 60 tablet 6   rizatriptan  (MAXALT -MLT) 10 MG disintegrating tablet Take 10 mg by mouth as needed for migraine.     rosuvastatin  (CRESTOR ) 40 MG tablet Take 40 mg by mouth daily.     spironolactone  (ALDACTONE ) 25 MG tablet Take 1 tablet (25 mg total) by mouth daily. 30 tablet 0   torsemide  (DEMADEX ) 20 MG tablet Take 3 tablets (60 mg total) by mouth 2 (two) times daily. 120 tablet 5   Continuous Glucose Sensor (DEXCOM G7 SENSOR) MISC ONE SENSOR EVERY 10 DAYS 30 DAYS (Patient not taking: Reported on 05/11/2023)     No current facility-administered medications for this encounter.    Allergies  Allergen Reactions   Augmentin  [Amoxicillin -Pot Clavulanate] Anaphylaxis   Amoxicillin  Nausea And Vomiting    Has patient had a PCN reaction causing immediate rash, facial/tongue/throat swelling, SOB or lightheadedness with hypotension: No Has patient had a PCN reaction causing severe rash involving mucus membranes or skin necrosis: No Has patient had a PCN reaction that required hospitalization: No Has patient had a PCN reaction occurring within the last 10 years: Yes If all of the above answers are NO, then may proceed with Cephalosporin use.    Metformin  And Related Nausea And Vomiting   Ozempic (0.25 Or 0.5 Mg-Dose) [Semaglutide(0.25 Or 0.5mg -Dos)] Nausea And Vomiting   Trulicity [Dulaglutide] Nausea And Vomiting    Vitals:   05/11/23 1042  BP: 104/72  Pulse: 98  SpO2: 96%  Weight: (!) 152.7 kg (336 lb 9.6 oz)   Filed Weights   05/11/23 1042  Weight: (!) 152.7 kg (336 lb 9.6 oz)     PHYSICAL EXAM: General: Well appearing. No distress on RA HEENT: neck supple.   Cardiac: JVP not visible. S1 and S2 present. No murmurs or rub. Resp: Lung sounds clear and equal B/L Abdomen: Soft, non-tender, non-distended. +  BS. Extremities: Warm and dry. No rash, cyanosis.  No edema.  Neuro: Alert and oriented x3. Affect pleasant. Moves all extremities without difficulty.  ASSESSMENT & PLAN: 1. HFpEF: Echo 10/24 limited study due to poor acoustic windows, however LVEF noted to be normal 60-65%, RV also reported as normal, trivial MR, unable to estimate PA systolic pressure. She was admitted in 10/24 with CHF exacerbation and diuresed. Unable to place Cardiomems d/t difficult access. RHC 1/25 with elevated filling pressures R>L with preserved CO and low PAPi. Volume ok. NYHA class III symptoms, limited by weight. - Continue Torsemide  60 mg bid + KCL 20 daily. BMET/BNP today - Continue Farxiga  10 mg daily. No GU symptoms. - Continue spironolactone  25 mg daily - Continue Lisinopril  20 mg daily - Unable to place Cardiomems d/t difficulty of vascular acces.  2. Mild Pulmonary Hypertension: PA mean 30 with PCWP  30 and PVR 2.25 - primarily WHO group 3 with obesity hypoventilation and OSA + component of group 2  - PFTs ordered 3. OSA: Continue CPAP.  4. HTN: BP controlled.  5. Obesity: Body mass index is 54.33 kg/m.  Failed Ozempic with nausea/vomiting.  - She is willing to try Mounjaro, referred to pharmacy clinic. 6. Diabetes Type II - A1C (11/24): 7.5  Follow up in 2 month with APP to assess volume   Horacio Werth, NP 05/11/2023

## 2023-05-11 ENCOUNTER — Ambulatory Visit (HOSPITAL_COMMUNITY)
Admission: RE | Admit: 2023-05-11 | Discharge: 2023-05-11 | Disposition: A | Discharge status: Home / Self Care | Payer: Medicaid Other | Source: Ambulatory Visit | Attending: Cardiology | Admitting: Cardiology

## 2023-05-11 ENCOUNTER — Encounter (HOSPITAL_COMMUNITY): Payer: Self-pay

## 2023-05-11 VITALS — BP 104/72 | HR 98 | Wt 336.6 lb

## 2023-05-11 DIAGNOSIS — G4733 Obstructive sleep apnea (adult) (pediatric): Secondary | ICD-10-CM

## 2023-05-11 DIAGNOSIS — I110 Hypertensive heart disease with heart failure: Secondary | ICD-10-CM | POA: Diagnosis present

## 2023-05-11 DIAGNOSIS — R112 Nausea with vomiting, unspecified: Secondary | ICD-10-CM | POA: Insufficient documentation

## 2023-05-11 DIAGNOSIS — Z79899 Other long term (current) drug therapy: Secondary | ICD-10-CM | POA: Diagnosis not present

## 2023-05-11 DIAGNOSIS — E662 Morbid (severe) obesity with alveolar hypoventilation: Secondary | ICD-10-CM | POA: Insufficient documentation

## 2023-05-11 DIAGNOSIS — I272 Pulmonary hypertension, unspecified: Secondary | ICD-10-CM | POA: Insufficient documentation

## 2023-05-11 DIAGNOSIS — E119 Type 2 diabetes mellitus without complications: Secondary | ICD-10-CM | POA: Diagnosis not present

## 2023-05-11 DIAGNOSIS — I5032 Chronic diastolic (congestive) heart failure: Secondary | ICD-10-CM | POA: Diagnosis not present

## 2023-05-11 DIAGNOSIS — Z6841 Body Mass Index (BMI) 40.0 and over, adult: Secondary | ICD-10-CM | POA: Diagnosis not present

## 2023-05-11 DIAGNOSIS — I10 Essential (primary) hypertension: Secondary | ICD-10-CM

## 2023-05-11 LAB — BASIC METABOLIC PANEL
Anion gap: 11 (ref 5–15)
BUN: 17 mg/dL (ref 6–20)
CO2: 27 mmol/L (ref 22–32)
Calcium: 9.4 mg/dL (ref 8.9–10.3)
Chloride: 98 mmol/L (ref 98–111)
Creatinine, Ser: 0.74 mg/dL (ref 0.44–1.00)
GFR, Estimated: >60 mL/min (ref >60)
Glucose, Bld: 221 mg/dL — ABNORMAL HIGH (ref 70–99)
Potassium: 4.6 mmol/L (ref 3.5–5.1)
Sodium: 136 mmol/L (ref 135–145)

## 2023-05-11 LAB — BRAIN NATRIURETIC PEPTIDE: B Natriuretic Peptide: 34.5 pg/mL (ref 0.0–100.0)

## 2023-05-11 NOTE — Addendum Note (Signed)
 Encounter addended by: Dontreal Miera, [place], NP on: 05/11/2023 4:52 PM  Actions taken: Clinical Note Signed

## 2023-05-11 NOTE — Patient Instructions (Signed)
 Medication Changes:  No Changes In Medications at this time.   Lab Work:  Labs done today, your results will be available in MyChart, we will contact you for abnormal readings.  Testing/Procedures:  Your physician has recommended that you have a pulmonary function test. Pulmonary Function Tests are a group of tests that measure how well air moves in and out of your lungs. SOMEONE WILL CALL TO GET THIS SCHEDULED FOR YOU- WILL BE HERE AT [facility]   Referrals:  YOU HAVE BEEN REFERRED TO PHARMD THEY WILL REACH OUT TO YOU OR CALL TO ARRANGE THIS. PLEASE CALL US  WITH ANY CONCERNS   Follow-Up in: 8 WEEKS AS SCHEDULED  At the [facility], you and your health needs are our priority. We have a designated team specialized in the treatment of Heart Failure. This Care Team includes your primary Heart Failure Specialized Cardiologist (physician), Advanced Practice Providers (APPs- Physician Assistants and Nurse Practitioners), and Pharmacist who all work together to provide you with the care you need, when you need it.   You may see any of the following providers on your designated Care Team at your next follow up:  Dr. Toribio Fuel Dr. Ezra Shuck Dr. Ria Commander Dr. Odis Brownie Greig Mosses, NP Caffie Shed, [place] [facility] Melissa, [place] Beckey Coe, NP Jordan Lee, NP Tinnie Redman, PharmD   Please be sure to bring in all your medications bottles to every appointment.   Need to Contact Us :  If you have any questions or concerns before your next appointment please send us  a message through [place] or call our office at [phone].    TO LEAVE A MESSAGE FOR THE NURSE SELECT OPTION 2, PLEASE LEAVE A MESSAGE INCLUDING: YOUR NAME DATE OF BIRTH CALL BACK NUMBER REASON FOR CALL**this is important as we prioritize the call backs  YOU WILL RECEIVE A CALL BACK THE SAME DAY AS LONG AS YOU CALL BEFORE 4:00 PM

## 2023-05-11 NOTE — Addendum Note (Signed)
 Encounter addended by: Nechama Escutia, [place], NP on: 05/11/2023 4:58 PM  Actions taken: Clinical Note Signed

## 2023-05-11 NOTE — Progress Notes (Signed)
 ReDS Vest / Clip - 05/11/23 1100       ReDS Vest / Clip   Station Marker D    Ruler Value 47    ReDS Value Range Low volume    ReDS Actual Value 23

## 2023-05-23 ENCOUNTER — Inpatient Hospital Stay (HOSPITAL_COMMUNITY): Admission: RE | Admit: 2023-05-23 | Payer: Medicaid Other | Source: Ambulatory Visit

## 2023-06-01 ENCOUNTER — Inpatient Hospital Stay (HOSPITAL_COMMUNITY): Admission: RE | Admit: 2023-06-01 | Payer: Medicaid Other | Source: Ambulatory Visit

## 2023-06-06 ENCOUNTER — Ambulatory Visit (HOSPITAL_COMMUNITY)
Admission: RE | Admit: 2023-06-06 | Discharge: 2023-06-06 | Disposition: A | Discharge status: Home / Self Care | Payer: Medicaid Other | Source: Ambulatory Visit | Attending: Cardiology | Admitting: Cardiology

## 2023-06-06 DIAGNOSIS — I5032 Chronic diastolic (congestive) heart failure: Secondary | ICD-10-CM | POA: Insufficient documentation

## 2023-06-06 LAB — PULMONARY FUNCTION TEST
DL/VA % pred: 107 %
DL/VA: 4.67 ml/min/mmHg/L
DLCO unc % pred: 94 %
DLCO unc: 22.05 ml/min/mmHg
FEF 25-75 Post: 2.36 L/s
FEF 25-75 Pre: 2.76 L/s
FEF2575-%Change-Post: -14 %
FEF2575-%Pred-Post: 74 %
FEF2575-%Pred-Pre: 86 %
FEV1-%Change-Post: -5 %
FEV1-%Pred-Post: 81 %
FEV1-%Pred-Pre: 86 %
FEV1-Post: 2.59 L
FEV1-Pre: 2.73 L
FEV1FVC-%Change-Post: 1 %
FEV1FVC-%Pred-Pre: 99 %
FEV6-%Change-Post: -6 %
FEV6-%Pred-Post: 81 %
FEV6-%Pred-Pre: 87 %
FEV6-Post: 3.13 L
FEV6-Pre: 3.35 L
FEV6FVC-%Pred-Post: 102 %
FEV6FVC-%Pred-Pre: 102 %
FVC-%Change-Post: -6 %
FVC-%Pred-Post: 79 %
FVC-%Pred-Pre: 85 %
FVC-Post: 3.13 L
FVC-Pre: 3.35 L
Post FEV1/FVC ratio: 83 %
Post FEV6/FVC ratio: 100 %
Pre FEV1/FVC ratio: 82 %
Pre FEV6/FVC Ratio: 100 %
RV % pred: 106 %
RV: 1.84 L
TLC % pred: 96 %
TLC: 5.16 L

## 2023-06-06 MED ORDER — ALBUTEROL SULFATE (2.5 MG/3ML) 0.083% IN NEBU
2.5000 mg | INHALATION_SOLUTION | Freq: Once | RESPIRATORY_TRACT | Status: AC
  Administered 2023-06-06: 2.5 mg via RESPIRATORY_TRACT

## 2023-06-27 ENCOUNTER — Ambulatory Visit: Payer: Medicaid Other

## 2023-07-05 ENCOUNTER — Other Ambulatory Visit: Payer: Self-pay

## 2023-07-05 ENCOUNTER — Encounter (HOSPITAL_COMMUNITY): Payer: Self-pay

## 2023-07-05 ENCOUNTER — Inpatient Hospital Stay (HOSPITAL_COMMUNITY)
Admission: EM | Admit: 2023-07-05 | Discharge: 2023-07-10 | DRG: 291 | Disposition: A | Discharge status: Home / Self Care | Attending: Internal Medicine | Admitting: Internal Medicine

## 2023-07-05 ENCOUNTER — Emergency Department (HOSPITAL_COMMUNITY)

## 2023-07-05 DIAGNOSIS — R079 Chest pain, unspecified: Principal | ICD-10-CM

## 2023-07-05 DIAGNOSIS — Z794 Long term (current) use of insulin: Secondary | ICD-10-CM

## 2023-07-05 DIAGNOSIS — I110 Hypertensive heart disease with heart failure: Principal | ICD-10-CM | POA: Diagnosis present

## 2023-07-05 DIAGNOSIS — Z79899 Other long term (current) drug therapy: Secondary | ICD-10-CM

## 2023-07-05 DIAGNOSIS — G8929 Other chronic pain: Secondary | ICD-10-CM | POA: Diagnosis present

## 2023-07-05 DIAGNOSIS — E1169 Type 2 diabetes mellitus with other specified complication: Secondary | ICD-10-CM | POA: Diagnosis present

## 2023-07-05 DIAGNOSIS — I509 Heart failure, unspecified: Secondary | ICD-10-CM

## 2023-07-05 DIAGNOSIS — R0600 Dyspnea, unspecified: Secondary | ICD-10-CM

## 2023-07-05 DIAGNOSIS — I10 Essential (primary) hypertension: Secondary | ICD-10-CM | POA: Diagnosis present

## 2023-07-05 DIAGNOSIS — Z833 Family history of diabetes mellitus: Secondary | ICD-10-CM

## 2023-07-05 DIAGNOSIS — Z87891 Personal history of nicotine dependence: Secondary | ICD-10-CM

## 2023-07-05 DIAGNOSIS — E876 Hypokalemia: Secondary | ICD-10-CM | POA: Diagnosis present

## 2023-07-05 DIAGNOSIS — Z6841 Body Mass Index (BMI) 40.0 and over, adult: Secondary | ICD-10-CM

## 2023-07-05 DIAGNOSIS — I272 Pulmonary hypertension, unspecified: Secondary | ICD-10-CM | POA: Diagnosis present

## 2023-07-05 DIAGNOSIS — Z803 Family history of malignant neoplasm of breast: Secondary | ICD-10-CM

## 2023-07-05 DIAGNOSIS — F419 Anxiety disorder, unspecified: Secondary | ICD-10-CM | POA: Diagnosis present

## 2023-07-05 DIAGNOSIS — K760 Fatty (change of) liver, not elsewhere classified: Secondary | ICD-10-CM | POA: Diagnosis present

## 2023-07-05 DIAGNOSIS — E1165 Type 2 diabetes mellitus with hyperglycemia: Secondary | ICD-10-CM | POA: Diagnosis present

## 2023-07-05 DIAGNOSIS — G4733 Obstructive sleep apnea (adult) (pediatric): Secondary | ICD-10-CM | POA: Diagnosis present

## 2023-07-05 DIAGNOSIS — I5033 Acute on chronic diastolic (congestive) heart failure: Secondary | ICD-10-CM | POA: Diagnosis present

## 2023-07-05 DIAGNOSIS — Z8249 Family history of ischemic heart disease and other diseases of the circulatory system: Secondary | ICD-10-CM

## 2023-07-05 DIAGNOSIS — Z806 Family history of leukemia: Secondary | ICD-10-CM

## 2023-07-05 DIAGNOSIS — D6862 Lupus anticoagulant syndrome: Secondary | ICD-10-CM | POA: Diagnosis present

## 2023-07-05 DIAGNOSIS — Z832 Family history of diseases of the blood and blood-forming organs and certain disorders involving the immune mechanism: Secondary | ICD-10-CM

## 2023-07-05 DIAGNOSIS — E871 Hypo-osmolality and hyponatremia: Secondary | ICD-10-CM | POA: Diagnosis present

## 2023-07-05 DIAGNOSIS — Z1152 Encounter for screening for COVID-19: Secondary | ICD-10-CM

## 2023-07-05 DIAGNOSIS — Z90710 Acquired absence of both cervix and uterus: Secondary | ICD-10-CM

## 2023-07-05 DIAGNOSIS — E785 Hyperlipidemia, unspecified: Secondary | ICD-10-CM | POA: Diagnosis present

## 2023-07-05 DIAGNOSIS — E66813 Obesity, class 3: Secondary | ICD-10-CM | POA: Diagnosis present

## 2023-07-05 LAB — BASIC METABOLIC PANEL
Anion gap: 14 (ref 5–15)
BUN: 21 mg/dL — ABNORMAL HIGH (ref 6–20)
CO2: 25 mmol/L (ref 22–32)
Calcium: 9.3 mg/dL (ref 8.9–10.3)
Chloride: 97 mmol/L — ABNORMAL LOW (ref 98–111)
Creatinine, Ser: 0.99 mg/dL (ref 0.44–1.00)
GFR, Estimated: >60 mL/min (ref >60)
Glucose, Bld: 149 mg/dL — ABNORMAL HIGH (ref 70–99)
Potassium: 3.6 mmol/L (ref 3.5–5.1)
Sodium: 136 mmol/L (ref 135–145)

## 2023-07-05 LAB — BRAIN NATRIURETIC PEPTIDE: B Natriuretic Peptide: 180.5 pg/mL — ABNORMAL HIGH (ref 0.0–100.0)

## 2023-07-05 LAB — CBC
HCT: 46.3 % — ABNORMAL HIGH (ref 36.0–46.0)
Hemoglobin: 14.7 g/dL (ref 12.0–15.0)
MCH: 26.6 pg (ref 26.0–34.0)
MCHC: 31.7 g/dL (ref 30.0–36.0)
MCV: 83.9 fL (ref 80.0–100.0)
Platelets: 268 10*3/uL (ref 150–400)
RBC: 5.52 MIL/uL — ABNORMAL HIGH (ref 3.87–5.11)
RDW: 17.1 % — ABNORMAL HIGH (ref 11.5–15.5)
WBC: 16.8 10*3/uL — ABNORMAL HIGH (ref 4.0–10.5)
nRBC: 0 % (ref 0.0–0.2)

## 2023-07-05 LAB — TROPONIN I (HIGH SENSITIVITY): Troponin I (High Sensitivity): 7 ng/L (ref <18)

## 2023-07-05 MED ORDER — FUROSEMIDE 10 MG/ML IJ SOLN
40.0000 mg | Freq: Once | INTRAMUSCULAR | Status: AC
  Administered 2023-07-06: 40 mg via INTRAVENOUS
  Filled 2023-07-05: qty 4

## 2023-07-05 NOTE — ED Triage Notes (Addendum)
 Patient BIB [facility] from home after CP started while cooking dinner, sharp, radiating to left arm. Diagnosed last fall with CHF, EMS administered 324 mg aspirin en route. 6/10 chest pain at this time and feels like tightness into her throat as well. Patient also reports her stomach feels tight as well, like when she was diagnosed with CHF>

## 2023-07-05 NOTE — ED Provider Triage Note (Signed)
 Emergency Medicine Provider Triage Evaluation Note  Kathleen Walsh , a 43 y.o. female  was evaluated in triage.  Pt complains of shortness of breath and chest tightness.  Has a history of CHF on torsemide.  Has been compliant with her medications.  Given aspirin en route.  Chest pain 6 out of 10 with mild associated shortness of breath.  States she feels tense in the abdomen which is what she previously felt when fluid overloaded in the past.  Review of Systems  Positive: Shortness of breath, chest tightness, abdominal distention Negative: Nausea, vomiting, head, fever  Physical Exam  BP 108/78 (BP Location: Right Arm)   Pulse (!) 101   Temp 98.8 F (37.1 C) (Oral)   Resp 16   Ht 5\' 5"  (1.651 m)   Wt (!) 151.5 kg   LMP 08/31/2015   SpO2 98%   BMI 55.58 kg/m  Gen:   Awake, no distress   Resp:  Normal effort  MSK:   Moves extremities without difficulty  Other:    Medical Decision Making  Medically screening exam initiated at 11:46 PM.  Appropriate orders placed.  Kathleen Walsh was informed that the remainder of the evaluation will be completed by another provider, this initial triage assessment does not replace that evaluation, and the importance of remaining in the ED until their evaluation is complete.    Kathleen Score, MD 07/05/23 [phone]

## 2023-07-06 ENCOUNTER — Encounter (HOSPITAL_COMMUNITY): Payer: Medicaid Other

## 2023-07-06 ENCOUNTER — Emergency Department (HOSPITAL_COMMUNITY)

## 2023-07-06 DIAGNOSIS — E876 Hypokalemia: Secondary | ICD-10-CM | POA: Diagnosis present

## 2023-07-06 DIAGNOSIS — I272 Pulmonary hypertension, unspecified: Secondary | ICD-10-CM | POA: Diagnosis present

## 2023-07-06 DIAGNOSIS — R109 Unspecified abdominal pain: Secondary | ICD-10-CM

## 2023-07-06 DIAGNOSIS — D6862 Lupus anticoagulant syndrome: Secondary | ICD-10-CM | POA: Diagnosis present

## 2023-07-06 DIAGNOSIS — Z87891 Personal history of nicotine dependence: Secondary | ICD-10-CM | POA: Diagnosis not present

## 2023-07-06 DIAGNOSIS — Z6841 Body Mass Index (BMI) 40.0 and over, adult: Secondary | ICD-10-CM | POA: Diagnosis not present

## 2023-07-06 DIAGNOSIS — R0602 Shortness of breath: Secondary | ICD-10-CM | POA: Diagnosis present

## 2023-07-06 DIAGNOSIS — G4733 Obstructive sleep apnea (adult) (pediatric): Secondary | ICD-10-CM

## 2023-07-06 DIAGNOSIS — I10 Essential (primary) hypertension: Secondary | ICD-10-CM

## 2023-07-06 DIAGNOSIS — E785 Hyperlipidemia, unspecified: Secondary | ICD-10-CM

## 2023-07-06 DIAGNOSIS — I509 Heart failure, unspecified: Secondary | ICD-10-CM

## 2023-07-06 DIAGNOSIS — R079 Chest pain, unspecified: Secondary | ICD-10-CM

## 2023-07-06 DIAGNOSIS — Z1152 Encounter for screening for COVID-19: Secondary | ICD-10-CM | POA: Diagnosis not present

## 2023-07-06 DIAGNOSIS — Z803 Family history of malignant neoplasm of breast: Secondary | ICD-10-CM | POA: Diagnosis not present

## 2023-07-06 DIAGNOSIS — I5033 Acute on chronic diastolic (congestive) heart failure: Secondary | ICD-10-CM

## 2023-07-06 DIAGNOSIS — K760 Fatty (change of) liver, not elsewhere classified: Secondary | ICD-10-CM | POA: Diagnosis present

## 2023-07-06 DIAGNOSIS — E66813 Obesity, class 3: Secondary | ICD-10-CM | POA: Diagnosis present

## 2023-07-06 DIAGNOSIS — E1169 Type 2 diabetes mellitus with other specified complication: Secondary | ICD-10-CM | POA: Diagnosis present

## 2023-07-06 DIAGNOSIS — G8929 Other chronic pain: Secondary | ICD-10-CM | POA: Diagnosis present

## 2023-07-06 DIAGNOSIS — E871 Hypo-osmolality and hyponatremia: Secondary | ICD-10-CM | POA: Diagnosis present

## 2023-07-06 DIAGNOSIS — E1165 Type 2 diabetes mellitus with hyperglycemia: Secondary | ICD-10-CM

## 2023-07-06 DIAGNOSIS — Z833 Family history of diabetes mellitus: Secondary | ICD-10-CM | POA: Diagnosis not present

## 2023-07-06 DIAGNOSIS — Z8249 Family history of ischemic heart disease and other diseases of the circulatory system: Secondary | ICD-10-CM | POA: Diagnosis not present

## 2023-07-06 DIAGNOSIS — I110 Hypertensive heart disease with heart failure: Secondary | ICD-10-CM | POA: Diagnosis present

## 2023-07-06 DIAGNOSIS — F419 Anxiety disorder, unspecified: Secondary | ICD-10-CM | POA: Diagnosis present

## 2023-07-06 DIAGNOSIS — Z79899 Other long term (current) drug therapy: Secondary | ICD-10-CM | POA: Diagnosis not present

## 2023-07-06 DIAGNOSIS — Z806 Family history of leukemia: Secondary | ICD-10-CM | POA: Diagnosis not present

## 2023-07-06 DIAGNOSIS — Z794 Long term (current) use of insulin: Secondary | ICD-10-CM | POA: Diagnosis not present

## 2023-07-06 LAB — BASIC METABOLIC PANEL
Anion gap: 15 (ref 5–15)
BUN: 22 mg/dL — ABNORMAL HIGH (ref 6–20)
CO2: 24 mmol/L (ref 22–32)
Calcium: 9.3 mg/dL (ref 8.9–10.3)
Chloride: 95 mmol/L — ABNORMAL LOW (ref 98–111)
Creatinine, Ser: 1.06 mg/dL — ABNORMAL HIGH (ref 0.44–1.00)
GFR, Estimated: >60 mL/min (ref >60)
Glucose, Bld: 220 mg/dL — ABNORMAL HIGH (ref 70–99)
Potassium: 3.8 mmol/L (ref 3.5–5.1)
Sodium: 134 mmol/L — ABNORMAL LOW (ref 135–145)

## 2023-07-06 LAB — RESP PANEL BY RT-PCR (RSV, FLU A&B, COVID)  RVPGX2
Influenza A by PCR: NEGATIVE
Influenza B by PCR: NEGATIVE
Resp Syncytial Virus by PCR: NEGATIVE
SARS Coronavirus 2 by RT PCR: NEGATIVE

## 2023-07-06 LAB — URINALYSIS, ROUTINE W REFLEX MICROSCOPIC
Bacteria, UA: NONE SEEN
Bilirubin Urine: NEGATIVE
Glucose, UA: 150 mg/dL — AB
Hgb urine dipstick: NEGATIVE
Ketones, ur: NEGATIVE mg/dL
Nitrite: NEGATIVE
Protein, ur: NEGATIVE mg/dL
Specific Gravity, Urine: 1.012 (ref 1.005–1.030)
pH: 5 (ref 5.0–8.0)

## 2023-07-06 LAB — GLUCOSE, CAPILLARY
Glucose-Capillary: 139 mg/dL — ABNORMAL HIGH (ref 70–99)
Glucose-Capillary: 133 mg/dL — ABNORMAL HIGH (ref 70–99)

## 2023-07-06 LAB — MAGNESIUM: Magnesium: 2.3 mg/dL (ref 1.7–2.4)

## 2023-07-06 LAB — CBG MONITORING, ED
Glucose-Capillary: 154 mg/dL — ABNORMAL HIGH (ref 70–99)
Glucose-Capillary: 138 mg/dL — ABNORMAL HIGH (ref 70–99)

## 2023-07-06 MED ORDER — FUROSEMIDE 10 MG/ML IJ SOLN
120.0000 mg | Freq: Two times a day (BID) | INTRAVENOUS | Status: DC
  Administered 2023-07-06 – 2023-07-09 (×6): 120 mg via INTRAVENOUS
  Filled 2023-07-06: qty 10
  Filled 2023-07-06 (×4): qty 12
  Filled 2023-07-06 (×2): qty 10
  Filled 2023-07-06: qty 12

## 2023-07-06 MED ORDER — ENOXAPARIN SODIUM 80 MG/0.8ML IJ SOSY
70.0000 mg | PREFILLED_SYRINGE | Freq: Every day | INTRAMUSCULAR | Status: DC
  Administered 2023-07-07 – 2023-07-10 (×4): 70 mg via SUBCUTANEOUS
  Filled 2023-07-06 (×4): qty 0.8

## 2023-07-06 MED ORDER — INSULIN ASPART 100 UNIT/ML IJ SOLN: 0.0000 [IU] | Freq: Every day | INTRAMUSCULAR | Status: DC

## 2023-07-06 MED ORDER — CETIRIZINE HCL 10 MG PO TABS
5.0000 mg | ORAL_TABLET | Freq: Every day | ORAL | Status: DC | PRN
Start: 2023-07-06 — End: 2023-07-10

## 2023-07-06 MED ORDER — FUROSEMIDE 10 MG/ML IJ SOLN: 80.0000 mg | Freq: Two times a day (BID) | INTRAMUSCULAR | Status: DC

## 2023-07-06 MED ORDER — BUTALBITAL-APAP-CAFFEINE 50-325-40 MG PO TABS
1.0000 | ORAL_TABLET | Freq: Four times a day (QID) | ORAL | Status: DC | PRN
  Administered 2023-07-06 – 2023-07-10 (×4): 1 via ORAL
  Filled 2023-07-06 (×5): qty 1

## 2023-07-06 MED ORDER — MORPHINE SULFATE (PF) 2 MG/ML IV SOLN
2.0000 mg | INTRAVENOUS | Status: DC | PRN
  Administered 2023-07-06: 2 mg via INTRAVENOUS
  Filled 2023-07-06: qty 1

## 2023-07-06 MED ORDER — INSULIN GLARGINE 100 UNIT/ML ~~LOC~~ SOLN
85.0000 [IU] | Freq: Every day | SUBCUTANEOUS | Status: DC
  Administered 2023-07-06: 85 [IU] via SUBCUTANEOUS
  Filled 2023-07-06 (×2): qty 0.85

## 2023-07-06 MED ORDER — ACETAMINOPHEN 325 MG PO TABS: 650.0000 mg | ORAL_TABLET | Freq: Four times a day (QID) | ORAL | Status: DC | PRN

## 2023-07-06 MED ORDER — OXYCODONE HCL 5 MG PO TABS
5.0000 mg | ORAL_TABLET | ORAL | Status: DC | PRN
  Administered 2023-07-06 – 2023-07-08 (×2): 5 mg via ORAL
  Filled 2023-07-06 (×3): qty 1

## 2023-07-06 MED ORDER — MELATONIN 5 MG PO TABS: 5.0000 mg | ORAL_TABLET | Freq: Every evening | ORAL | Status: DC | PRN

## 2023-07-06 MED ORDER — POTASSIUM CHLORIDE CRYS ER 20 MEQ PO TBCR
20.0000 meq | EXTENDED_RELEASE_TABLET | Freq: Every day | ORAL | Status: DC
  Administered 2023-07-06: 20 meq via ORAL
  Filled 2023-07-06: qty 1

## 2023-07-06 MED ORDER — ENOXAPARIN SODIUM 40 MG/0.4ML IJ SOSY
40.0000 mg | PREFILLED_SYRINGE | Freq: Every day | INTRAMUSCULAR | Status: DC
  Administered 2023-07-06: 40 mg via SUBCUTANEOUS
  Filled 2023-07-06: qty 0.4

## 2023-07-06 MED ORDER — ALBUTEROL SULFATE (2.5 MG/3ML) 0.083% IN NEBU: 2.5000 mg | INHALATION_SOLUTION | RESPIRATORY_TRACT | Status: DC | PRN

## 2023-07-06 MED ORDER — INSULIN ASPART 100 UNIT/ML IJ SOLN
0.0000 [IU] | Freq: Three times a day (TID) | INTRAMUSCULAR | Status: DC
  Administered 2023-07-06 – 2023-07-07 (×4): 3 [IU] via SUBCUTANEOUS
  Administered 2023-07-07: 4 [IU] via SUBCUTANEOUS
  Administered 2023-07-08: 3 [IU] via SUBCUTANEOUS
  Administered 2023-07-08 – 2023-07-10 (×6): 4 [IU] via SUBCUTANEOUS

## 2023-07-06 MED ORDER — ONDANSETRON HCL 4 MG/2ML IJ SOLN: 4.0000 mg | Freq: Four times a day (QID) | INTRAMUSCULAR | Status: DC | PRN

## 2023-07-06 MED ORDER — FUROSEMIDE 10 MG/ML IJ SOLN
120.0000 mg | Freq: Two times a day (BID) | INTRAVENOUS | Status: DC
  Filled 2023-07-06: qty 12
  Filled 2023-07-06: qty 10

## 2023-07-06 MED ORDER — DAPAGLIFLOZIN PROPANEDIOL 10 MG PO TABS
10.0000 mg | ORAL_TABLET | Freq: Every day | ORAL | Status: DC
  Administered 2023-07-06 – 2023-07-10 (×5): 10 mg via ORAL
  Filled 2023-07-06 (×5): qty 1

## 2023-07-06 MED ORDER — ACETAMINOPHEN 650 MG RE SUPP: 650.0000 mg | Freq: Four times a day (QID) | RECTAL | Status: DC | PRN

## 2023-07-06 MED ORDER — RIZATRIPTAN BENZOATE 10 MG PO TBDP: 10.0000 mg | ORAL_TABLET | ORAL | Status: DC | PRN

## 2023-07-06 MED ORDER — FUROSEMIDE 10 MG/ML IJ SOLN
80.0000 mg | Freq: Once | INTRAMUSCULAR | Status: AC
  Administered 2023-07-06: 80 mg via INTRAVENOUS
  Filled 2023-07-06: qty 8

## 2023-07-06 MED ORDER — ONDANSETRON HCL 4 MG PO TABS: 4.0000 mg | ORAL_TABLET | Freq: Four times a day (QID) | ORAL | Status: DC | PRN

## 2023-07-06 MED ORDER — ROSUVASTATIN CALCIUM 20 MG PO TABS
40.0000 mg | ORAL_TABLET | Freq: Every day | ORAL | Status: DC
  Administered 2023-07-06 – 2023-07-10 (×5): 40 mg via ORAL
  Filled 2023-07-06 (×5): qty 2

## 2023-07-06 MED ORDER — ONDANSETRON HCL 4 MG/2ML IJ SOLN
4.0000 mg | Freq: Once | INTRAMUSCULAR | Status: AC
  Administered 2023-07-06: 4 mg via INTRAVENOUS
  Filled 2023-07-06: qty 2

## 2023-07-06 MED ORDER — SODIUM CHLORIDE 0.9% FLUSH
3.0000 mL | Freq: Two times a day (BID) | INTRAVENOUS | Status: DC
  Administered 2023-07-06 – 2023-07-10 (×9): 3 mL via INTRAVENOUS

## 2023-07-06 NOTE — H&P (Signed)
 History and Physical    Patient: Kathleen Walsh:[id number] DOB: 1981-03-10 DOA: 07/05/2023 DOS: the patient was seen and examined on 07/06/2023 PCP: [facility], [facility]  Patient coming from: Home via EMS  Chief Complaint:  Chief Complaint  Patient presents with   Chest Pain   HPI: Kathleen Walsh is a 43 y.o. female with medical history significant of hypertension, hyperlipidemia, heart failure with preserved EF, diabetes mellitus type 2, lupus anticoagulant, anemia, anxiety, depression, OSA, morbid obesity presents with complaints of chest pain that began yesterday while cooking dinner. The pain is described as a tightness in her chest and throat, located behind her left breast, and radiating to her back and shoulder. She did not take any medication at home for the pain, but was given aspirin on the way to the hospital. No shortness of breath or palpitations were noted.  She has a history of congestive heart failure, diagnosed in November, and has been monitoring her weight daily. Her weight has been stable with minor fluctuations of about a pound. Since arriving at the hospital, she notes significant leg swelling, with additional swelling in her stomach, hands, and feet. Her stomach feels 'real tight', and she has experienced difficulty urinating, which improved after receiving Lasix this morning. She has been taking torosemide 60 mg twice daily since a right heart catheterization in January.  She mentions a sensation in her chest akin to having food stuck, despite not having eaten anything. She also reports nausea but denies recent vomiting.  Patient also complains of some left lower quadrant abdominal pain.  She has not been able to eat due to the pain and is experiencing significant discomfort. She has not engaged in any recent physical activity.  Records note she had underwent right heart cath on 05/05/2023, but had unsuccessful cardiomems placement due to difficult groin access.   Catheterization did note elevated right more so than left heart filling pressures with mild pulmonary venous hypertension and preserved cardiac output with low pulmonary artery pulsatile index.     En route with EMS patient had been given 324 mg of aspirin.  Upon admission into the emergency department patient was noted to be afebrile with tachypnea and all other vital signs relatively maintained.  Labs from 3/5 significant for WBC 16.8, BUN 21, creatinine 0.99, glucose 149, BNP 180.5, and high-sensitivity troponin 7.  Influenza, COVID-19, and RSV screening were negative.  Urinalysis noted trace leukocytes with no bacteria or significant WBCs.  Chest x-ray noted heart at the upper size of normal with vascular congestion.  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  Patient having given a total of 120 mg of furosemide IV overnight and Zofran.   Review of Systems: As mentioned in the history of present illness. All other systems reviewed and are negative. Past Medical History:  Diagnosis Date   Abdominal wall pain    Anemia    Anxiety    Blood dyscrasia    lupus anticoagulant during pregnancy   Complication of anesthesia    pt states became " panicky" after left shoulder surgery 10/2019   Depression    takes cymbalta    Diabetes mellitus without complication (HCC)    Headache(784.0)    migraines   Hx of lupus anticoagulant disorder    Hyperlipidemia    Hypertension    Obesity    PONV (postoperative nausea and vomiting)  Sleep apnea    USES C-PAP   Past Surgical History:  Procedure Laterality Date   ABDOMINAL HYSTERECTOMY  2016   ANKLE ARTHROSCOPY WITH REPAIR SUBLUXING TENDON Left    BIOPSY  11/22/2021   Procedure: BIOPSY;  Surgeon: Imogene Burn, MD;  Location: [facility];  Service: Gastroenterology;;   CESAREAN SECTION WITH BILATERAL TUBAL LIGATION Bilateral  02/11/2013   Procedure: Repeat CESAREAN SECTION WITH BILATERAL TUBAL LIGATION;  Surgeon: Lenoard Aden, MD;  Location: [facility];  Service: Obstetrics;  Laterality: Bilateral;  EDD: 03/01/13   COLONOSCOPY WITH PROPOFOL N/A 11/22/2021   Procedure: COLONOSCOPY WITH PROPOFOL;  Surgeon: Imogene Burn, MD;  Location: [facility];  Service: Gastroenterology;  Laterality: N/A;   DEBRIDEMENT OF ABDOMINAL WALL ABSCESS N/A 12/03/2014   Procedure: INCISION OF ABDOMINAL WALL LIPOMA;  Surgeon: Gaynelle Adu, MD;  Location: [facility];  Service: General;  Laterality: N/A;   DILATION AND CURETTAGE OF UTERUS  2004   DILITATION & CURRETTAGE/HYSTROSCOPY WITH NOVASURE ABLATION N/A 11/19/2014   Procedure: DILATATION & CURETTAGE/HYSTEROSCOPY WITH NOVASURE ABLATION;  Surgeon: Olivia Mackie, MD;  Location: [facility];  Service: Gynecology;  Laterality: N/A;   ENDOMETRIAL ABLATION  10/20/2014   ESOPHAGOGASTRODUODENOSCOPY (EGD) WITH PROPOFOL N/A 11/22/2021   Procedure: ESOPHAGOGASTRODUODENOSCOPY (EGD) WITH PROPOFOL;  Surgeon: Imogene Burn, MD;  Location: [facility];  Service: Gastroenterology;  Laterality: N/A;   FOOT SURGERY  2008   HERNIA REPAIR  09/19/2011   INCISION AND DRAINAGE ABSCESS N/A 12/17/2022   Procedure: INCISION AND DRAINAGE PERIMANDIBULAR ABSCESS;  Surgeon: Christia Reading, MD;  Location: [facility];  Service: ENT;  Laterality: N/A;   LAPAROSCOPY N/A 12/03/2014   Procedure: LAPAROSCOPY DIAGNOSTIC WITH LYSIS OF ADHESIONS;  Surgeon: Gaynelle Adu, MD;  Location: [facility];  Service: General;  Laterality: N/A;   left shoulder surgery      NASAL SEPTUM SURGERY     POLYPECTOMY  11/22/2021   Procedure: POLYPECTOMY;  Surgeon: Imogene Burn, MD;  Location: [facility];  Service: Gastroenterology;;  EGD and COLON   RIGHT HEART CATH N/A 05/05/2023   Procedure: RIGHT HEART CATH;  Surgeon: Laurey Morale, MD;  Location: [facility];  Service: Cardiovascular;  Laterality: N/A;   TUBAL LIGATION     VENTRAL HERNIA REPAIR   09/20/2010   Laparoscopic, Dr Gaynelle Adu   WISDOM TOOTH EXTRACTION     Social History:  reports that she has quit smoking. Her smoking use included cigarettes. She started smoking about 6 years ago. She has a 17.5 pack-year smoking history. She has never used smokeless tobacco. She reports that she does not drink alcohol and does not use drugs.  Allergies  Allergen Reactions   Augmentin [Amoxicillin-Pot Clavulanate] Anaphylaxis   Amoxicillin Nausea And Vomiting    Has patient had a PCN reaction causing immediate rash, facial/tongue/throat swelling, SOB or lightheadedness with hypotension: No Has patient had a PCN reaction causing severe rash involving mucus membranes or skin necrosis: No Has patient had a PCN reaction that required hospitalization: No Has patient had a PCN reaction occurring within the last 10 years: Yes If all of the above answers are "NO", then may proceed with Cephalosporin use.    Metformin And Related Nausea And Vomiting   Ozempic (0.25 Or 0.5 Mg-Dose) [Semaglutide(0.25 Or 0.5mg -Dos)] Nausea And Vomiting   Trulicity [Dulaglutide] Nausea And Vomiting    Family History  Problem Relation Age of Onset   Diabetes Mother    Hypertension Mother    Heart disease Mother  Diabetes Father    Hypertension Father    Colon polyps Father    Colitis Father    Irritable bowel syndrome Father    Breast cancer Maternal Grandmother    Crohn's disease Maternal Grandmother    Prostate cancer Maternal Grandfather    Clotting disorder Paternal Grandmother    Leukemia Paternal Grandfather    Irritable bowel syndrome Daughter    Stomach cancer Neg Hx    Esophageal cancer Neg Hx     Prior to Admission medications   Medication Sig Start Date End Date Taking? Authorizing Provider  dapagliflozin propanediol (FARXIGA) 10 MG TABS tablet Take 1 tablet (10 mg total) by mouth daily. 03/22/23  Yes Sharol Harness, Brittainy M, PA-C  insulin glargine (LANTUS) 100 UNIT/ML injection Inject 85  Units into the skin at bedtime.   Yes [provider]  levocetirizine (XYZAL) 5 MG tablet Take 5 mg by mouth daily as needed for allergies.   Yes [provider]  lisinopril (ZESTRIL) 20 MG tablet Take 20 mg by mouth daily.   Yes [provider]  melatonin 5 MG TABS Take 5 mg by mouth at bedtime as needed (insomnia).   Yes [provider]  NOVOLOG FLEXPEN 100 UNIT/ML FlexPen Inject 20-50 Units into the skin 3 (three) times daily before meals. Per sliding scale 11/07/22  Yes [provider]  ondansetron (ZOFRAN) 8 MG tablet TAKE 1 TABLET (8 MG TOTAL) BY MOUTH 3 (THREE) TIMES DAILY AS NEEDED FOR NAUSEA OR VOMITING. 04/18/23  Yes Imogene Burn, MD  oxyCODONE (OXY IR/ROXICODONE) 5 MG immediate release tablet Take 1 tablet (5 mg total) by mouth every 4 (four) hours as needed for moderate pain (pain score 4-6). 03/07/23  Yes Jerald Kief, MD  potassium chloride (KLOR-CON M) 10 MEQ tablet Take 2 tablets (20 mEq total) by mouth daily. 05/05/23  Yes Laurey Morale, MD  rizatriptan (MAXALT-MLT) 10 MG disintegrating tablet Take 10 mg by mouth as needed for migraine. 11/10/22  Yes [provider]  rosuvastatin (CRESTOR) 40 MG tablet Take 40 mg by mouth daily.   Yes [provider]  spironolactone (ALDACTONE) 25 MG tablet Take 1 tablet (25 mg total) by mouth daily. 03/08/23  Yes Jerald Kief, MD  torsemide (DEMADEX) 20 MG tablet Take 3 tablets (60 mg total) by mouth 2 (two) times daily. 05/05/23  Yes Laurey Morale, MD    Physical Exam: Vitals:   07/05/23 1931 07/06/23 0206 07/06/23 0500 07/06/23 0612  BP:  (!) 123/43 98/61   Pulse:  97 96   Resp:  16 (!) 23   Temp:  98.9 F (37.2 C)  98.6 F (37 C)  TempSrc:  Oral  Oral  SpO2:  95% 99%   Weight: (!) 151.5 kg     Height: 5\' 5"  (1.651 m)      Constitutional: Morbidly obese female who appears to be in some distress Eyes: PERRL, lids and conjunctivae normal ENMT: Mucous membranes are  moist. Posterior pharynx clear of any exudate or lesions.Normal dentition.  Neck: normal, supple.  Unable to appreciate JVD Respiratory: Very diminished breath sounds.  Some crackles appreciated in the lower lung fields. Cardiovascular: Regular rate and rhythm, no murmurs / rubs / gallops. No extremity edema. 2+ pedal pulses. No carotid bruits.  Abdomen: Protuberant abdomen, nontender to palpation.  Bowel sounds positive.  Musculoskeletal: . No joint deformity upper and lower extremities.  Skin: no rashes, lesions, ulcers. No induration Neurologic: CN 2-12 grossly intact.Marland Kitchen  Able to move all extremities Psychiatric: Normal judgment and insight. Alert and oriented x 3. Normal mood.   Data Reviewed:  EKG reveals sinus tachycardia 104 bpm that appears similar to previous.  Reviewed labs, imaging, and pertinent records as documented.  Assessment and Plan:  Suspected heart failure with preserved ejection fraction Acute on chronic.  Patient presents with complaints of chest pain and shortness of breath.  On physical exam patient with some lower extremity swelling.  BNP elevated at 180 which is at least times 5 times higher most recent checks.  Last echocardiogram noted EF to be 60 to 65% with left ventricle no regional wall motion abnormalities appreciated. -Admit to a cardiac telemetry bed -Strict I&O's and daily weights -Lasix 120 mg IV twice daily.  Adjust diuresis as needed. -Cardiology consulted, will follow-up for any further recommendations  Chest pain Patient presents with left-sided chest pain with radiation to her back and shoulder.  Initial high-sensitivity troponin negative.  EKG without significant ischemic changes. 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  Patient reports having regular bowel movements. CT scan of the abdomen and pelvis noted duodenal malrotation as seen on previous study in 2021 without signs  of obstruction or volvulus noted.    Essential hypertension Blood pressures were noted to be maintained at the lower end. -Held lisinopril and spironolactone.  Determine when medically appropriate to resume  Uncontrolled diabetes mellitus type 2, with long-term use of insulin On admission glucose 149.  Last hemoglobin A1c was 7.5 03/02/2023. -Hypoglycemic protocols -Continue Farxiga -Continue Lantus 85 units nightly -CBGs before every meal with resistant SSI -Adjust insulin regimen as needed  Chronic pain Patient also reports long history of back pain. -Continue oxycodone  Hyperlipidemia -Continue Crestor  Obstructive sleep apnea Morbid obesity BMI 55.58 kg/m -Continue CPAP at night  DVT prophylaxis: Lovenox Advance Care Planning:   Code Status: Full Code   Consults: Cardiology  Family Communication: None  Severity of Illness: The appropriate patient status for this patient is INPATIENT. Inpatient status is judged to be reasonable and necessary in order to provide the required intensity of service to ensure the patient's safety. The patient's presenting symptoms, physical exam findings, and initial radiographic and laboratory data in the context of their chronic comorbidities is felt to place them at high risk for further clinical deterioration. Furthermore, it is not anticipated that the patient will be medically stable for discharge from the hospital within 2 midnights of admission.   * I certify that at the point of admission it is my clinical judgment that the patient will require inpatient hospital care spanning beyond 2 midnights from the point of admission due to high intensity of service, high risk for further deterioration and high frequency of surveillance required.*  Author: Clydie Braun, MD 07/06/2023 7:16 AM  For on call review [email or web address].

## 2023-07-06 NOTE — ED Provider Notes (Signed)
 [place] EMERGENCY DEPARTMENT AT [facility] Provider Note  [name]: [id number] Arrival date & time: 07/05/23 7829  Chief Complaint(s) Chest Pain  HPI Kathleen Walsh is a 43 y.o. female with PMH CHF, T2DM, HTN, OSA who presents emergency room for evaluation of chest pain, shortness of breath abdominal pain.  States that pain began abruptly today substernal rating to left arm.  Endorsing abdominal distention and weight gain.  States that these are similar symptoms that she experienced prior to her last hospital admission where she was diuresed over 11 L.  Takes torsemide and spinal lactone.  Also endorsing urinary urgency but despite taking her home diuretic she is only had 1 urinary movement in the last 24 hours.   Past Medical History Past Medical History:  Diagnosis Date   Abdominal wall pain    Anemia    Anxiety    Blood dyscrasia    lupus anticoagulant during pregnancy   Complication of anesthesia    pt states became " panicky" after left shoulder surgery 10/2019   Depression    takes cymbalta    Diabetes mellitus without complication (HCC)    Headache(784.0)    migraines   Hx of lupus anticoagulant disorder    Hyperlipidemia    Hypertension    Obesity    PONV (postoperative nausea and vomiting)    Sleep apnea    USES C-PAP   Patient Active Problem List   Diagnosis Date Noted   CHF (congestive heart failure) (HCC) 03/02/2023   Dental abscess 12/18/2022   Submandibular abscess 12/15/2022   Pulmonary nodule 08/08/2017   OSA on CPAP 08/08/2017   Acute back pain with sciatica, left 08/14/2016   Trochanteric bursitis, left hip 08/12/2016   Impingement syndrome of left ankle 07/20/2016   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 Authorizing Provider  Continuous Glucose Sensor (DEXCOM G7 SENSOR) MISC ONE SENSOR EVERY 10 DAYS 30 DAYS Patient not taking: Reported on 05/11/2023 03/15/23   [provider]  dapagliflozin propanediol (FARXIGA) 10 MG TABS tablet Take 1 tablet (10 mg total) by mouth daily. 03/22/23   Robbie Lis M, PA-C  famotidine (PEPCID) 20 MG tablet Take 20 mg by mouth daily as needed for heartburn or indigestion.    [provider]  insulin glargine (LANTUS) 100 UNIT/ML injection Inject 85 Units into the skin at bedtime.    [provider]  levocetirizine (XYZAL) 5 MG tablet Take 5 mg by mouth daily as needed for allergies.    [provider]  lisinopril (ZESTRIL) 20 MG tablet Take 20 mg by mouth daily.    [provider]  NOVOLOG FLEXPEN 100 UNIT/ML FlexPen Inject 20-50 Units into the skin 3 (three) times daily before meals. Per sliding scale 11/07/22   [provider]  ondansetron (ZOFRAN) 8 MG tablet TAKE 1 TABLET (8 MG TOTAL) BY MOUTH 3 (THREE) TIMES DAILY AS NEEDED FOR NAUSEA OR VOMITING. 04/18/23  Imogene Burn, MD  oxyCODONE (OXY IR/ROXICODONE) 5 MG immediate release tablet Take 1 tablet (5 mg total) by mouth every 4 (four) hours as needed for moderate pain (pain score 4-6). 03/07/23   Jerald Kief, MD  potassium chloride (KLOR-CON M) 10 MEQ tablet Take 2 tablets (20 mEq total) by mouth daily. 05/05/23   Laurey Morale, MD  rizatriptan (MAXALT-MLT) 10 MG disintegrating tablet Take 10 mg by mouth as needed for migraine. 11/10/22   [provider]  rosuvastatin (CRESTOR) 40 MG tablet  Take 40 mg by mouth daily.    [provider]  spironolactone (ALDACTONE) 25 MG tablet Take 1 tablet (25 mg total) by mouth daily. 03/08/23   Jerald Kief, MD  torsemide (DEMADEX) 20 MG tablet Take 3 tablets (60 mg total) by mouth 2 (two) times daily. 05/05/23   Laurey Morale, MD                                                                                                                                    Past Surgical History Past Surgical History:  Procedure Laterality Date   ABDOMINAL HYSTERECTOMY  2016   ANKLE ARTHROSCOPY WITH REPAIR SUBLUXING TENDON Left    BIOPSY  11/22/2021   Procedure: BIOPSY;  Surgeon: Imogene Burn, MD;  Location: [facility];  Service: Gastroenterology;;   CESAREAN SECTION WITH BILATERAL TUBAL LIGATION Bilateral 02/11/2013   Procedure: Repeat CESAREAN SECTION WITH BILATERAL TUBAL LIGATION;  Surgeon: Lenoard Aden, MD;  Location: [facility];  Service: Obstetrics;  Laterality: Bilateral;  EDD: 03/01/13   COLONOSCOPY WITH PROPOFOL N/A 11/22/2021   Procedure: COLONOSCOPY WITH PROPOFOL;  Surgeon: Imogene Burn, MD;  Location: [facility];  Service: Gastroenterology;  Laterality: N/A;   DEBRIDEMENT OF ABDOMINAL WALL ABSCESS N/A 12/03/2014   Procedure: INCISION OF ABDOMINAL WALL LIPOMA;  Surgeon: Gaynelle Adu, MD;  Location: [facility];  Service: General;  Laterality: N/A;   DILATION AND CURETTAGE OF UTERUS  2004   DILITATION & CURRETTAGE/HYSTROSCOPY WITH NOVASURE ABLATION N/A 11/19/2014   Procedure: DILATATION & CURETTAGE/HYSTEROSCOPY WITH NOVASURE ABLATION;  Surgeon: Olivia Mackie, MD;  Location: [facility];  Service: Gynecology;  Laterality: N/A;   ENDOMETRIAL ABLATION  10/20/2014   ESOPHAGOGASTRODUODENOSCOPY (EGD) WITH PROPOFOL N/A 11/22/2021   Procedure: ESOPHAGOGASTRODUODENOSCOPY (EGD) WITH PROPOFOL;  Surgeon: Imogene Burn, MD;  Location: [facility];  Service: Gastroenterology;  Laterality: N/A;   FOOT SURGERY  2008   HERNIA REPAIR  09/19/2011   INCISION  AND DRAINAGE ABSCESS N/A 12/17/2022   Procedure: INCISION AND DRAINAGE PERIMANDIBULAR ABSCESS;  Surgeon: Christia Reading, MD;  Location: [facility];  Service: ENT;  Laterality: N/A;   LAPAROSCOPY N/A 12/03/2014   Procedure: LAPAROSCOPY DIAGNOSTIC WITH LYSIS OF ADHESIONS;  Surgeon: Gaynelle Adu, MD;  Location: [facility];  Service: General;  Laterality: N/A;   left shoulder surgery      NASAL SEPTUM SURGERY  POLYPECTOMY  11/22/2021   Procedure: POLYPECTOMY;  Surgeon: Imogene Burn, MD;  Location: [facility];  Service: Gastroenterology;;  EGD and COLON   RIGHT HEART CATH N/A 05/05/2023   Procedure: RIGHT HEART CATH;  Surgeon: Laurey Morale, MD;  Location: [facility] CV [facility];  Service: Cardiovascular;  Laterality: N/A;   TUBAL LIGATION     VENTRAL HERNIA REPAIR  09/20/2010   Laparoscopic, Dr Gaynelle Adu   WISDOM TOOTH EXTRACTION     Family History Family History  Problem Relation Age of Onset   Diabetes Mother    Hypertension Mother    Heart disease Mother    Diabetes Father    Hypertension Father    Colon polyps Father    Colitis Father    Irritable bowel syndrome Father    Breast cancer Maternal Grandmother    Crohn's disease Maternal Grandmother    Prostate cancer Maternal Grandfather    Clotting disorder Paternal Grandmother    Leukemia Paternal Grandfather    Irritable bowel syndrome Daughter    Stomach cancer Neg Hx    Esophageal cancer Neg Hx     Social History Social History   Tobacco Use   Smoking status: Former    Current packs/day: 0.75    Average packs/day: 0.7 packs/day for 23.3 years (17.5 ttl pk-yrs)    Types: Cigarettes    Start date: 05/2017   Smokeless tobacco: Never  Vaping Use   Vaping status: Never Used  Substance Use Topics   Alcohol use: No    Comment: "once every three years"    Drug use: No   Allergies Augmentin [amoxicillin-pot clavulanate], Amoxicillin, Metformin and related, Ozempic (0.25 or 0.5 mg-dose) [semaglutide(0.25 or 0.5mg -dos)], and  Trulicity [dulaglutide]  Review of Systems Review of Systems  Respiratory:  Positive for shortness of breath.   Cardiovascular:  Positive for chest pain.  Gastrointestinal:  Positive for abdominal distention.  Genitourinary:  Positive for urgency.    Physical Exam Vital Signs  I have reviewed the triage vital signs BP (!) 123/43   Pulse 97   Temp 98.9 F (37.2 C) (Oral)   Resp 16   Ht 5\' 5"  (1.651 m)   Wt (!) 151.5 kg   LMP 08/31/2015   SpO2 95%   BMI 55.58 kg/m   Physical Exam Vitals and nursing note reviewed.  Constitutional:      General: She is not in acute distress.    Appearance: She is well-developed.  HENT:     Head: Normocephalic and atraumatic.  Eyes:     Conjunctiva/sclera: Conjunctivae normal.  Cardiovascular:     Rate and Rhythm: Normal rate and regular rhythm.     Heart sounds: No murmur heard. Pulmonary:     Effort: Pulmonary effort is normal. No respiratory distress.     Breath sounds: Normal breath sounds.  Abdominal:     Palpations: Abdomen is soft.     Tenderness: There is no abdominal tenderness.  Musculoskeletal:        General: No swelling.     Cervical back: Neck supple.  Skin:    General: Skin is warm and dry.     Capillary Refill: Capillary refill takes less than 2 seconds.  Neurological:     Mental Status: She is alert.  Psychiatric:        Mood and Affect: Mood normal.     ED Results and Treatments Labs (all labs ordered are listed, but only abnormal results are displayed) Labs Reviewed  BASIC  METABOLIC PANEL - Abnormal; Notable for the following components:      Result Value   Chloride 97 (*)    Glucose, Bld 149 (*)    BUN 21 (*)    All other components within normal limits  CBC - Abnormal; Notable for the following components:   WBC 16.8 (*)    RBC 5.52 (*)    HCT 46.3 (*)    RDW 17.1 (*)    All other components within normal limits  BRAIN NATRIURETIC PEPTIDE - Abnormal; Notable for the following components:   B  Natriuretic Peptide 180.5 (*)    All other components within normal limits  RESP PANEL BY RT-PCR (RSV, FLU A&B, COVID)  RVPGX2  URINALYSIS, ROUTINE W REFLEX MICROSCOPIC  TROPONIN I (HIGH SENSITIVITY)                                                                                                                          Radiology CT ABDOMEN PELVIS WO CONTRAST Result Date: 07/06/2023 CLINICAL DATA:  Left lower quadrant abdominal pain EXAM: CT ABDOMEN AND PELVIS WITHOUT CONTRAST TECHNIQUE: Multidetector CT imaging of the abdomen and pelvis was performed following the standard protocol without IV contrast. RADIATION DOSE REDUCTION: This exam was performed according to the departmental dose-optimization program which includes automated exposure control, adjustment of the mA and/or kV according to patient size and/or use of iterative reconstruction technique. COMPARISON:  CT 08/03/2022 FINDINGS: Lower chest: No acute abnormality. Hepatobiliary: Hepatic steatosis. Unremarkable gallbladder and biliary tree. Pancreas: Unremarkable. Spleen: The previous splenic lesion is not well visualized without IV contrast. No acute abnormality. Adrenals/Urinary Tract: Normal adrenal glands. No urinary calculi or hydronephrosis. Bladder is unremarkable. 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 Normal caliber large and small bowel. No bowel wall thickening. The appendix is normal.Stomach is within normal limits. Vascular/Lymphatic: No significant vascular findings are present. No enlarged abdominal or pelvic lymph nodes. Reproductive: Hysterectomy. Other: No free intraperitoneal fluid or air. Musculoskeletal: No acute fracture. IMPRESSION: 1. No acute abnormality in the abdomen or pelvis. 2. Hepatic steatosis. 3. Duodenal malrotation.  No evidence of obstruction or volvulus. Electronically Signed   By: Minerva Fester M.D.   On: 07/06/2023 03:25    DG Chest 2 View Result Date: 07/05/2023 CLINICAL DATA:  Chest pain.  Leg swelling.  History of CHF. EXAM: CHEST - 2 VIEW COMPARISON:  Radiograph 03/02/2023 FINDINGS: Upper normal heart size. Stable mediastinal contours. Slight vascular congestion without pulmonary edema. No large pleural effusion or focal airspace disease. No pneumothorax. Soft tissue attenuation from habitus limits assessment. IMPRESSION: Upper normal heart size with vascular congestion. Electronically Signed   By: Narda Rutherford M.D.   On: 07/05/2023 22:19    Pertinent labs & imaging results that were available during my care of the patient were reviewed by me and considered in my medical decision making (see MDM for details).  Medications Ordered in ED  Medications  ondansetron (ZOFRAN) injection 4 mg (has no administration in time range)  furosemide (LASIX) injection 80 mg (has no administration in time range)  furosemide (LASIX) injection 40 mg (40 mg Intravenous Given 07/06/23 0157)                                                                                                                                     Procedures Procedures  (including critical care time)  Medical Decision Making / ED Course   This patient presents to the ED for concern of chest pain, shortness of breath, abdominal distention, this involves an extensive number of treatment options, and is a complaint that carries with it a high risk of complications and morbidity.  The differential diagnosis includes ACS, Aortic Dissection, Pneumothorax, Pneumonia, Esophageal Rupture, PE, Tamponade/Pericardial Effusion, pericarditis, esophageal spasm, dysrhythmia, GERD, costochondritis.  MDM: Patient seen emergency room for evaluation of chest pain, shortness of breath.  Physical exam reveals an uncomfortable appearing patient with faint rales bilaterally but is otherwise unremarkable.  Laboratory evaluation with leukocytosis to 16.8, BNP significant  elevated to 180.5, high-sensitivity troponin is normal.,  Chest x-ray with cardiomegaly and vascular congestion.  CT abdomen pelvis largely unremarkable.  Patient initially given 40 of Lasix and patient has not had successful diuresis.  Review of previous chart showing patient was receiving 120 mg of Lasix while on the floor and thus an additional 80 was given.  Will require hospital admission for persistent chest pain and fluid overload with failure of outpatient oral diuresis.   Additional history obtained:  -External records from outside source obtained and reviewed including: Chart review including previous notes, labs, imaging, consultation notes   Lab Tests: -I ordered, reviewed, and interpreted labs.   The pertinent results include:   Labs Reviewed  BASIC METABOLIC PANEL - Abnormal; Notable for the following components:      Result Value   Chloride 97 (*)    Glucose, Bld 149 (*)    BUN 21 (*)    All other components within normal limits  CBC - Abnormal; Notable for the following components:   WBC 16.8 (*)    RBC 5.52 (*)    HCT 46.3 (*)    RDW 17.1 (*)    All other components within normal limits  BRAIN NATRIURETIC PEPTIDE - Abnormal; Notable for the following components:   B Natriuretic Peptide 180.5 (*)    All other components within normal limits  RESP PANEL BY RT-PCR (RSV, FLU A&B, COVID)  RVPGX2  URINALYSIS, ROUTINE W REFLEX MICROSCOPIC  TROPONIN I (HIGH SENSITIVITY)      EKG   EKG Interpretation Date/Time:  Wednesday July 05 2023 19:52:56 EST Ventricular Rate:  104 PR Interval:  156 QRS Duration:  80 QT Interval:  360 QTC Calculation: 473 R Axis:   78  Text Interpretation: Sinus tachycardia Otherwise normal ECG When compared with ECG of 11-May-2023 10:46, PREVIOUS ECG  IS PRESENT Confirmed by Neviah Braud [phone]) on 07/06/2023 3:55:42 AM         Imaging Studies ordered: I ordered imaging studies including chest x-ray, CTAP I independently visualized and  interpreted imaging. I agree with the radiologist interpretation   Medicines ordered and prescription drug management: Meds ordered this encounter  Medications   furosemide (LASIX) injection 40 mg   ondansetron (ZOFRAN) injection 4 mg   furosemide (LASIX) injection 80 mg    -I have reviewed the patients home medicines and have made adjustments as needed  Critical interventions none   Cardiac Monitoring: The patient was maintained on a cardiac monitor.  I personally viewed and interpreted the cardiac monitored which showed an underlying rhythm of: Sinus tachycardia  Social Determinants of Health:  Factors impacting patients care include: none   Reevaluation: After the interventions noted above, I reevaluated the patient and found that they have :stayed the same  Co morbidities that complicate the patient evaluation  Past Medical History:  Diagnosis Date   Abdominal wall pain    Anemia    Anxiety    Blood dyscrasia    lupus anticoagulant during pregnancy   Complication of anesthesia    pt states became " panicky" after left shoulder surgery 10/2019   Depression    takes cymbalta    Diabetes mellitus without complication (HCC)    Headache(784.0)    migraines   Hx of lupus anticoagulant disorder    Hyperlipidemia    Hypertension    Obesity    PONV (postoperative nausea and vomiting)    Sleep apnea    USES C-PAP      Dispostion: I considered admission for this patient, and patient require hospital mission for failure of outpatient diuresis and fluid overload     Final Clinical Impression(s) / ED Diagnoses Final diagnoses:  None     @PCDICTATION @    [name], MD 07/06/23 [phone]

## 2023-07-06 NOTE — Progress Notes (Signed)
 Heart Failure Navigator Progress Note  Assessed for Heart & Vascular TOC clinic readiness.  Patient does not meet criteria due to Advanced Heart Failure Team patient of Dr. Shirlee Latch.   Navigator will sign off at this time.    Rhae Hammock, BSN, Scientist, clinical (histocompatibility and immunogenetics) Only

## 2023-07-06 NOTE — Consult Note (Addendum)
 Cardiology Consultation   Patient ID: Kathleen Walsh MRN: [id number]; DOB: 1980/07/04  Admit date: 07/05/2023 Date of Consult: 07/06/2023  PCP:  Practice, [facility]   [facility] Providers Cardiologist:  Kathleen Red, MD        Patient Profile:   Kathleen Walsh is a 43 y.o. female with a hx of HFpEF, obesity, OSA on CPAP, hypertension, DM type II, lupus anticoagulant (in the setting of pregnancy) who is being seen 07/06/2023 for the evaluation of acute CHF at the request of Kathleen Walsh.  History of Present Illness:   Kathleen Walsh was hospitalized in the Fall of 2024 with progressive exertional dyspnea and peripheral edema. BNP during that admission was 296 and CXR noted cardiomegaly with pulmonary edema. Echo (limited by poor acoustic windows 2/2 body habitus) showed LVEF 60-65% with normal RV function. Trivial MR seen. She diuresed 16 pounds and was discharged on Lasix 40mg  BID, Spironolactone 25mg , Farxiga, and Lisinopril 20mg . Since this admission, patient has been followed in AHF clinic. She was seen in December and reported increased exertional dyspnea at that time despite BID lasix. She was transitioned to Torsemide and arranged for RHC/Cardiomems implantation. 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 Kathleen Walsh was not able to place Cardiomems due to vascular access difficulty. Given her RHC findings, Torsemide increased to 60mg  BID.   Patient presented to the ED on 3/6 with EMS for evaluation of chest pain, shortness of breath, and abdominal discomfort/firmness. Patient had abrupt onset of substernal chest discomfort radiating to her left arm yesterday. She also felt short of breath with abdominal distention and notes small degree of weight gain. Chest pain is left sided, described as a cramping sensation with radiation around to her back. No identified triggers or  relieving factors. She separately notes the sensation of "food stuck in my throat" and associated pressure sensation. Symptoms are not fully consistent with those felt last year in the setting of CHF exacerbation but some are similar including abdominal firmness/fullness. Patient reports chronic dizziness, describes room spinning sensation with rapid head movements. Denies tunnel vision/near syncope.  Patient has been compliant with home Torsemide and denies noticing a significant change in urine output at home. She denies significant dietary indiscretion, cooks most of her meals at home without salt. She does state that she restarted Mounjaro on Sunday 3/2, said that by Monday she had nausea/vomiting. These symptoms have since improved. Labs in the ED notable for BNP 180.5, creatinine 0.99, WBC 16.8, HS troponin 7. Respiratory virus panel negative. UA not consistent with UTI. CT abd/pelvis with no acute abnormalities.    Past Medical History:  Diagnosis Date   Abdominal wall pain    Anemia    Anxiety    Blood dyscrasia    lupus anticoagulant during pregnancy   Complication of anesthesia    pt states became " panicky" after left shoulder surgery 10/2019   Depression    takes cymbalta    Diabetes mellitus without complication (HCC)    Headache(784.0)    migraines   Hx of lupus anticoagulant disorder    Hyperlipidemia    Hypertension    Obesity    PONV (postoperative nausea and vomiting)    Sleep apnea    USES C-PAP    Past Surgical History:  Procedure Laterality Date   ABDOMINAL HYSTERECTOMY  2016   ANKLE ARTHROSCOPY WITH REPAIR SUBLUXING TENDON Left  BIOPSY  11/22/2021   Procedure: BIOPSY;  Surgeon: Kathleen Burn, MD;  Location: [facility];  Service: Gastroenterology;;   CESAREAN SECTION WITH BILATERAL TUBAL LIGATION Bilateral 02/11/2013   Procedure: Repeat CESAREAN SECTION WITH BILATERAL TUBAL LIGATION;  Surgeon: Kathleen Aden, MD;  Location: [facility];  Service: Obstetrics;   Laterality: Bilateral;  EDD: 03/01/13   COLONOSCOPY WITH PROPOFOL N/A 11/22/2021   Procedure: COLONOSCOPY WITH PROPOFOL;  Surgeon: Kathleen Burn, MD;  Location: [facility];  Service: Gastroenterology;  Laterality: N/A;   DEBRIDEMENT OF ABDOMINAL WALL ABSCESS N/A 12/03/2014   Procedure: INCISION OF ABDOMINAL WALL LIPOMA;  Surgeon: Kathleen Adu, MD;  Location: [facility];  Service: General;  Laterality: N/A;   DILATION AND CURETTAGE OF UTERUS  2004   DILITATION & CURRETTAGE/HYSTROSCOPY WITH NOVASURE ABLATION N/A 11/19/2014   Procedure: DILATATION & CURETTAGE/HYSTEROSCOPY WITH NOVASURE ABLATION;  Surgeon: Kathleen Mackie, MD;  Location: [facility];  Service: Gynecology;  Laterality: N/A;   ENDOMETRIAL ABLATION  10/20/2014   ESOPHAGOGASTRODUODENOSCOPY (EGD) WITH PROPOFOL N/A 11/22/2021   Procedure: ESOPHAGOGASTRODUODENOSCOPY (EGD) WITH PROPOFOL;  Surgeon: Kathleen Burn, MD;  Location: [facility];  Service: Gastroenterology;  Laterality: N/A;   FOOT SURGERY  2008   HERNIA REPAIR  09/19/2011   INCISION AND DRAINAGE ABSCESS N/A 12/17/2022   Procedure: INCISION AND DRAINAGE PERIMANDIBULAR ABSCESS;  Surgeon: Kathleen Reading, MD;  Location[facility];  Service: ENT;  Laterality: N/A;   LAPAROSCOPY N/A 12/03/2014   Procedure: LAPAROSCOPY DIAGNOSTIC WITH LYSIS OF ADHESIONS;  Surgeon: Kathleen Adu, MD;  Location: [facility];  Service: General;  Laterality: N/A;   left shoulder surgery      NASAL SEPTUM SURGERY     POLYPECTOMY  11/22/2021   Procedure: POLYPECTOMY;  Surgeon: Kathleen Burn, MD;  Location: [facility];  Service: Gastroenterology;;  EGD and COLON   RIGHT HEART CATH N/A 05/05/2023   Procedure: RIGHT HEART CATH;  Surgeon: Kathleen Morale, MD;  Location: [facility];  Service: Cardiovascular;  Laterality: N/A;   TUBAL LIGATION     VENTRAL HERNIA REPAIR  09/20/2010   Laparoscopic, Dr Kathleen Walsh   WISDOM TOOTH EXTRACTION       Home Medications:  Prior to Admission medications   Medication Sig Start  Date End Date Taking? Authorizing Provider  dapagliflozin propanediol (FARXIGA) 10 MG TABS tablet Take 1 tablet (10 mg total) by mouth daily. 03/22/23  Yes Sharol Harness, Brittainy M, PA-C  insulin glargine (LANTUS) 100 UNIT/ML injection Inject 85 Units into the skin at bedtime.   Yes [provider]  levocetirizine (XYZAL) 5 MG tablet Take 5 mg by mouth daily as needed for allergies.   Yes [provider]  lisinopril (ZESTRIL) 20 MG tablet Take 20 mg by mouth daily.   Yes [provider]  melatonin 5 MG TABS Take 5 mg by mouth at bedtime as needed (insomnia).   Yes [provider]  NOVOLOG FLEXPEN 100 UNIT/ML FlexPen Inject 20-50 Units into the skin 3 (three) times daily before meals. Per sliding scale 11/07/22  Yes [provider]  ondansetron (ZOFRAN) 8 MG tablet TAKE 1 TABLET (8 MG TOTAL) BY MOUTH 3 (THREE) TIMES DAILY AS NEEDED FOR NAUSEA OR VOMITING. 04/18/23  Yes Kathleen Burn, MD  oxyCODONE (OXY IR/ROXICODONE) 5 MG immediate release tablet Take 1 tablet (5 mg total) by mouth every 4 (four) hours as needed for moderate pain (pain score 4-6). 03/07/23  Yes Jerald Kief, MD  potassium chloride (KLOR-CON M) 10  MEQ tablet Take 2 tablets (20 mEq total) by mouth daily. 05/05/23  Yes Kathleen Morale, MD  rizatriptan (MAXALT-MLT) 10 MG disintegrating tablet Take 10 mg by mouth as needed for migraine. 11/10/22  Yes [provider]  rosuvastatin (CRESTOR) 40 MG tablet Take 40 mg by mouth daily.   Yes [provider]  spironolactone (ALDACTONE) 25 MG tablet Take 1 tablet (25 mg total) by mouth daily. 03/08/23  Yes Jerald Kief, MD  torsemide (DEMADEX) 20 MG tablet Take 3 tablets (60 mg total) by mouth 2 (two) times daily. 05/05/23  Yes Kathleen Morale, MD    Inpatient Medications: Scheduled Meds:  dapagliflozin propanediol  10 mg Oral Daily   enoxaparin (LOVENOX) injection  40 mg Subcutaneous Daily   furosemide  80 mg Intravenous BID   insulin  aspart  0-20 Units Subcutaneous TID WC   insulin aspart  0-5 Units Subcutaneous QHS   insulin glargine  85 Units Subcutaneous QHS   potassium chloride  20 mEq Oral Daily   rosuvastatin  40 mg Oral Daily   sodium chloride flush  3 mL Intravenous Q12H   Continuous Infusions:  PRN Meds: acetaminophen **OR** acetaminophen, albuterol, cetirizine, melatonin, morphine injection, ondansetron **OR** ondansetron (ZOFRAN) IV, oxyCODONE  Allergies:    Allergies  Allergen Reactions   Augmentin [Amoxicillin-Pot Clavulanate] Anaphylaxis   Amoxicillin Nausea And Vomiting    Has patient had a PCN reaction causing immediate rash, facial/tongue/throat swelling, SOB or lightheadedness with hypotension: No Has patient had a PCN reaction causing severe rash involving mucus membranes or skin necrosis: No Has patient had a PCN reaction that required hospitalization: No Has patient had a PCN reaction occurring within the last 10 years: Yes If all of the above answers are "NO", then may proceed with Cephalosporin use.    Metformin And Related Nausea And Vomiting   Ozempic (0.25 Or 0.5 Mg-Dose) [Semaglutide(0.25 Or 0.5mg -Dos)] Nausea And Vomiting   Trulicity [Dulaglutide] Nausea And Vomiting    Social History:   Social History   Socioeconomic History   Marital status: Married    Spouse name: Not on file   Number of children: 2   Years of education: Not on file   Highest education level: Some college, no degree  Occupational History   Occupation: None  Tobacco Use   Smoking status: Former    Current packs/day: 0.75    Average packs/day: 0.7 packs/day for 23.3 years (17.5 ttl pk-yrs)    Types: Cigarettes    Start date: 05/2017   Smokeless tobacco: Never  Vaping Use   Vaping status: Never Used  Substance and Sexual Activity   Alcohol use: No    Comment: "once every three years"    Drug use: No   Sexual activity: Yes    Birth control/protection: Surgical  Other Topics Concern   Not on file   Social History Narrative   Not on file   Social Drivers of Health   Financial Resource Strain: Low Risk  (04/11/2023)   Overall Financial Resource Strain (CARDIA)    Difficulty of Paying Living Expenses: Not very hard  Food Insecurity: No Food Insecurity (03/03/2023)   Hunger Vital Sign    Worried About Running Out of Food in the Last Year: Never true    Ran Out of Food in the Last Year: Never true  Transportation Needs: No Transportation Needs (03/06/2023)   PRAPARE - Administrator, Civil Service (Medical): No    Lack of Transportation (Non-Medical):  No  Physical Activity: Not on file  Stress: Not on file  Social Connections: Not on file  Intimate Partner Violence: Not on file    Family History:    Family History  Problem Relation Age of Onset   Diabetes Mother    Hypertension Mother    Heart disease Mother    Diabetes Father    Hypertension Father    Colon polyps Father    Colitis Father    Irritable bowel syndrome Father    Breast cancer Maternal Grandmother    Crohn's disease Maternal Grandmother    Prostate cancer Maternal Grandfather    Clotting disorder Paternal Grandmother    Leukemia Paternal Grandfather    Irritable bowel syndrome Daughter    Stomach cancer Neg Hx    Esophageal cancer Neg Hx      ROS:  Please see the history of present illness.   All other ROS reviewed and negative.     Physical Exam/Data:   Vitals:   07/06/23 0612 07/06/23 0900 07/06/23 0955 07/06/23 1230  BP:  105/78  91/63  Pulse:  94  96  Resp:  16  19  Temp: 98.6 F (37 C)  98.6 F (37 C)   TempSrc: Oral  Oral   SpO2:  99%  100%  Weight:      Height:        Intake/Output Summary (Last 24 hours) at 07/06/2023 1319 Last data filed at 07/06/2023 0701 Gross per 24 hour  Intake --  Output 500 ml  Net -500 ml      07/05/2023    7:31 PM 05/11/2023   10:42 AM 05/05/2023    6:09 AM  Last 3 Weights  Weight (lbs) 334 lb 336 lb 9.6 oz 333 lb  Weight (kg) 151.501 kg  152.681 kg 151.048 kg     Body mass index is 55.58 kg/m.  General:  obese female in no acute distress HEENT: normal Neck: no JVD Vascular: No carotid bruits; Distal pulses 2+ bilaterally Cardiac:  normal S1, S2; RRR; no murmur Lungs: generally distant breath sounds. Lower lobes with crackles bilaterally Abd: distended and firm. Non-tender Ext: no edema Musculoskeletal:  No deformities, BUE and BLE strength normal and equal Skin: warm and dry  Neuro:  CNs 2-12 intact, no focal abnormalities noted Psych:  Normal affect   EKG:  The EKG was personally reviewed and demonstrates:  sinus rhythm with no acute ischemic changes Telemetry:  Unable to view at time of consult due to patient having just moved units  Relevant CV Studies:  03/03/23 TTE  IMPRESSIONS     1. Poor acoustic windows limit study.   2. Left ventricular ejection fraction, by estimation, is 60 to 65%. The  left ventricle has normal function. The left ventricle has no regional  wall motion abnormalities.   3. Right ventricular systolic function is normal. The right ventricular  size is normal.   4. Trivial mitral valve regurgitation.   5. The aortic valve was not well visualized. Aortic valve regurgitation  is not visualized. No stenosis.   FINDINGS   Left Ventricle: Left ventricular ejection fraction, by estimation, is 60  to 65%. The left ventricle has normal function. The left ventricle has no  regional wall motion abnormalities. Definity contrast agent was given IV  to delineate the left ventricular   endocardial borders. The left ventricular internal cavity size was normal  in size.   Right Ventricle: The right ventricular size is normal. Right vetricular  wall thickness was not assessed. Right ventricular systolic function is  normal.   Left Atrium: Left atrial size was normal in size.   Right Atrium: Right atrial size was normal in size.   Pericardium: There is no evidence of pericardial effusion.    Mitral Valve: There is mild thickening of the mitral valve leaflet(s).  Trivial mitral valve regurgitation.   Tricuspid Valve: The tricuspid valve is not well visualized. Tricuspid  valve regurgitation is trivial.   Aortic Valve: The aortic valve was not well visualized. Aortic valve  regurgitation is not visualized. No stenosis. Aortic valve mean gradient  measures 4.7 mmHg. Aortic valve peak gradient measures 8.9 mmHg.   Pulmonic Valve: The pulmonic valve was not well visualized.   Aorta: The aortic root and ascending aorta are structurally normal, with  no evidence of dilitation.   Venous: The inferior vena cava was not well visualized.   05/05/23 RHC  1. Unsuccessful Cardiomems placement due very difficult groin access.  2. RHC showed elevated R > L heart filling pressures with mild pulmonary venous hypertension, preserved cardiac output but low PAPi.    I will have her increase her torsemide to 60 mg bid.   Laboratory Data:  High Sensitivity Troponin:   Recent Labs  Lab 07/05/23 2100  TROPONINIHS 7     Chemistry Recent Labs  Lab 07/05/23 2100  NA 136  K 3.6  CL 97*  CO2 25  GLUCOSE 149*  BUN 21*  CREATININE 0.99  CALCIUM 9.3  GFRNONAA >60  ANIONGAP 14    No results for input(s): "PROT", "ALBUMIN", "AST", "ALT", "ALKPHOS", "BILITOT" in the last 168 hours. Lipids No results for input(s): "CHOL", "TRIG", "HDL", "LABVLDL", "LDLCALC", "CHOLHDL" in the last 168 hours.  Hematology Recent Labs  Lab 07/05/23 2100  WBC 16.8*  RBC 5.52*  HGB 14.7  HCT 46.3*  MCV 83.9  MCH 26.6  MCHC 31.7  RDW 17.1*  PLT 268   Thyroid No results for input(s): "TSH", "FREET4" in the last 168 hours.  BNP Recent Labs  Lab 07/05/23 2100  BNP 180.5*    DDimer No results for input(s): "DDIMER" in the last 168 hours.   Radiology/Studies:  CT ABDOMEN PELVIS WO CONTRAST Result Date: 07/06/2023 CLINICAL DATA:  Left lower quadrant abdominal pain EXAM: CT ABDOMEN AND PELVIS  WITHOUT CONTRAST TECHNIQUE: Multidetector CT imaging of the abdomen and pelvis was performed following the standard protocol without IV contrast. RADIATION DOSE REDUCTION: This exam was performed according to the departmental dose-optimization program which includes automated exposure control, adjustment of the mA and/or kV according to patient size and/or use of iterative reconstruction technique. COMPARISON:  CT 08/03/2022 FINDINGS: Lower chest: No acute abnormality. Hepatobiliary: Hepatic steatosis. Unremarkable gallbladder and biliary tree. Pancreas: Unremarkable. Spleen: The previous splenic lesion is not well visualized without IV contrast. No acute abnormality. Adrenals/Urinary Tract: Normal adrenal glands. No urinary calculi or hydronephrosis. Bladder is unremarkable. 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 Normal caliber large and small bowel. No bowel wall thickening. The appendix is normal.Stomach is within normal limits. Vascular/Lymphatic: No significant vascular findings are present. No enlarged abdominal or pelvic lymph nodes. Reproductive: Hysterectomy. Other: No free intraperitoneal fluid or air. Musculoskeletal: No acute fracture. IMPRESSION: 1. No acute abnormality in the abdomen or pelvis. 2. Hepatic steatosis. 3. Duodenal malrotation.  No evidence of obstruction or volvulus. Electronically Signed   By: Minerva Fester M.D.   On: 07/06/2023  03:25   DG Chest 2 View Result Date: 07/05/2023 CLINICAL DATA:  Chest pain.  Leg swelling.  History of CHF. EXAM: CHEST - 2 VIEW COMPARISON:  Radiograph 03/02/2023 FINDINGS: Upper normal heart size. Stable mediastinal contours. Slight vascular congestion without pulmonary edema. No large pleural effusion or focal airspace disease. No pneumothorax. Soft tissue attenuation from habitus limits assessment. IMPRESSION: Upper normal heart size with vascular congestion.  Electronically Signed   By: Narda Rutherford M.D.   On: 07/05/2023 22:19    Assessment and Plan:   Acute on chronic HFpEF Patient admitted with acute CHF in October 2024, TTE showed LVEF 60-65% with normal RV function. She was diuresed 16 lbs and discharged on Lasix, spironolactone, farxiga, lisinopril. Continued to have dyspnea and weight gain, had RHC in January for cardiomems (unable to place). R > L filling pressures and mild pHTN, persevered CO but low PAPi. Since RHC, has been on Torsemide 60mg  BID with stable weights reported but still progressive dyspnea. Stable orthopnea reported. NYHA class II-III symptoms.   BNP this admission 180.5. CXR with vascular congestion, soft tissue attenuation. On exam, patient does have bibasilar crackles and abdominal firmness consistent with edema. No lower extremity edema. Given consistent urine output reported at home and no dietary indiscretion, not clear what would be driving CHF exacerbation.  Agree with high dose lasix as ordered. Given limited urine output with 40mg  followed by 80mg , will increase to 120mg  BID. Her QT segment appears longer than baseline on ECG, will recheck K and Mg before additional diuresis.  Continue Farxiga 10mg . Lisinopril and Spironolactone on hold due to reported hypotension. Question whether BP readings are accurate.  Chest pain Patient with left chest pain radiating into her back, described as a cramping sensation. ECG without acute ischemia. HS troponin negative.  Chest pain is atypical, low concern for ACS. Continue to monitor with volume management.   Hypertension As noted above, patient with borderline low BP this admission, most recently 95/59mmHg. Last clinic BP was 104/26mmHg. Question BP accuracy. Continue to monitor, okay to hold Spironolactone and Lisinopril for now.   Hyperlipidemia No recent lipid panel. Continue Rosuvastatin 40mg .  OSA Patient reports nightly compliance, even uses with daytime  naps. Continue inpatient use  DM type II Per primary team. Lab Results  Component Value Date   HGBA1C 7.5 (H) 03/02/2023   Abdominal pain No clear etiology of abdominal pain at this time. Suspect could be secondary to [employer] which she recently restarted. Management per primary team.   Chronic dizziness Patient reports chronic dizziness, describes room spinning sensation with rapid head movements. Denies tunnel vision. This sounds vestibular in nature and is consistent with previously reported dizziness. Continue to monitor.    Risk Assessment/Risk Scores:        New York Heart Association (NYHA) Functional Class NYHA Class II        For questions or updates, please contact [facility] Please consult [email or web address] for contact info under    Signed, Perlie Gold, PA-C  07/06/2023 1:19 PM   I have seen and examined the patient along with Perlie Gold, PA.  I have reviewed the chart, notes and new data.  I agree with PA/NP's note.  Key new complaints: Is feeling some relief in her presenting symptoms after the higher dose of furosemide this afternoon.  Urine output has picked up substantially.  Her sensation of "food getting stuck in her throat" started within hours of her dose of Mounjaro.  She had similar severe  GI side effects with Trulicity and Ozempic in the past. Key examination changes: Morbidly obese, abdominal distention, impossible to see jugular venous pulsations, lungs sound clear, regular rate and rhythm. Key new findings / data: Minimally elevated BUN and creatinine, substantial increase in BNP from her baseline (which is well within normal range), although not as high as during her previous hospitalization for heart failure  PLAN: Furosemide 120 mg twice daily.  I think we need to reestablish a true "dry weight".  I think she has lost some real weight and she is carrying possibly a few liters of extra fluid.  It appears that she has significant GI  side effects with all GLP-1 agonist and unfortunately we cannot use these to help with her weight loss.  Thurmon Fair, MD, [facility] [phone] 07/06/2023, 6:39 PM

## 2023-07-06 NOTE — Plan of Care (Signed)

## 2023-07-06 NOTE — ED Notes (Signed)
 Patient ambulated to bathroom with steady gait.

## 2023-07-07 DIAGNOSIS — E1169 Type 2 diabetes mellitus with other specified complication: Secondary | ICD-10-CM

## 2023-07-07 DIAGNOSIS — G8929 Other chronic pain: Secondary | ICD-10-CM | POA: Insufficient documentation

## 2023-07-07 DIAGNOSIS — E876 Hypokalemia: Secondary | ICD-10-CM | POA: Diagnosis not present

## 2023-07-07 DIAGNOSIS — E785 Hyperlipidemia, unspecified: Secondary | ICD-10-CM | POA: Insufficient documentation

## 2023-07-07 DIAGNOSIS — I10 Essential (primary) hypertension: Secondary | ICD-10-CM | POA: Diagnosis not present

## 2023-07-07 DIAGNOSIS — I5033 Acute on chronic diastolic (congestive) heart failure: Secondary | ICD-10-CM | POA: Diagnosis not present

## 2023-07-07 LAB — CBC
HCT: 41.2 % (ref 36.0–46.0)
Hemoglobin: 13.1 g/dL (ref 12.0–15.0)
MCH: 26 pg (ref 26.0–34.0)
MCHC: 31.8 g/dL (ref 30.0–36.0)
MCV: 81.9 fL (ref 80.0–100.0)
Platelets: 245 10*3/uL (ref 150–400)
RBC: 5.03 MIL/uL (ref 3.87–5.11)
RDW: 16.6 % — ABNORMAL HIGH (ref 11.5–15.5)
WBC: 11.2 10*3/uL — ABNORMAL HIGH (ref 4.0–10.5)
nRBC: 0 % (ref 0.0–0.2)

## 2023-07-07 LAB — GLUCOSE, CAPILLARY
Glucose-Capillary: 133 mg/dL — ABNORMAL HIGH (ref 70–99)
Glucose-Capillary: 157 mg/dL — ABNORMAL HIGH (ref 70–99)
Glucose-Capillary: 131 mg/dL — ABNORMAL HIGH (ref 70–99)
Glucose-Capillary: 166 mg/dL — ABNORMAL HIGH (ref 70–99)

## 2023-07-07 LAB — BASIC METABOLIC PANEL
Anion gap: 13 (ref 5–15)
BUN: 19 mg/dL (ref 6–20)
CO2: 24 mmol/L (ref 22–32)
Calcium: 9 mg/dL (ref 8.9–10.3)
Chloride: 98 mmol/L (ref 98–111)
Creatinine, Ser: 0.69 mg/dL (ref 0.44–1.00)
GFR, Estimated: >60 mL/min (ref >60)
Glucose, Bld: 129 mg/dL — ABNORMAL HIGH (ref 70–99)
Potassium: 3.2 mmol/L — ABNORMAL LOW (ref 3.5–5.1)
Sodium: 135 mmol/L (ref 135–145)

## 2023-07-07 MED ORDER — ALPRAZOLAM 0.5 MG PO TABS
0.5000 mg | ORAL_TABLET | Freq: Three times a day (TID) | ORAL | Status: DC | PRN
  Administered 2023-07-07 – 2023-07-09 (×3): 0.5 mg via ORAL
  Filled 2023-07-07 (×3): qty 1

## 2023-07-07 MED ORDER — POTASSIUM CHLORIDE CRYS ER 20 MEQ PO TBCR
40.0000 meq | EXTENDED_RELEASE_TABLET | ORAL | Status: AC
  Administered 2023-07-07 (×2): 40 meq via ORAL
  Filled 2023-07-07 (×2): qty 2

## 2023-07-07 MED ORDER — INSULIN GLARGINE 100 UNIT/ML ~~LOC~~ SOLN
20.0000 [IU] | Freq: Every day | SUBCUTANEOUS | Status: DC
  Administered 2023-07-07 – 2023-07-09 (×3): 20 [IU] via SUBCUTANEOUS
  Filled 2023-07-07 (×4): qty 0.2

## 2023-07-07 NOTE — Assessment & Plan Note (Addendum)
 Echocardiogram with preserved LV systolic function with EF 60 to 65%, RV systolic function preserved, trivial mitral valve regurgitation, RV size normal, with preserved systolic function, poor acoustic windows.   Patient was placed on IV furosemide for diuresis, negative fluid balance was achieved, - 11,007 ml, with significant improvement in her symptoms.   Plan to continue diuresis with oral loop diuretic with torsemide 60 mg po bid and spironolactone 25 mg daily.  Continue dapagliflozin and lisinopril.  In case of volume overload instructed to take extra 20 mg torsemide in am.   Patient ruled out for acute coronary syndrome.

## 2023-07-07 NOTE — Assessment & Plan Note (Addendum)
 Uncontrolled hyperglycemia.  Fasting glucose this morning 144 mg/dl.   Continue glucose cover and monitoring with insulin sliding scale. Continue basal insulin with glargine, decreased dose to avoid hypoglycemia

## 2023-07-07 NOTE — Evaluation (Addendum)
 Physical Therapy Evaluation Patient Details Name: Kathleen Walsh MRN: [id number] DOB: December 22, 1980 Today's Date: 07/07/2023  History of Present Illness  43 y.o. female presents to [facility] hospital on 07/05/2023 with chest pain and worsening peripheral edema, along with orthopnea. Pt admitted for CHF exacerbation. PMH includes obesity, HLD, HTN, anemia, depression, DM, OSA.  Clinical Impression  Pt presents to PT without significant deficits in mobility. Pt is able to ambulate for community distances without DME, SpO2 remains in mid 90s and pt denies DOE. Pt has no further acute PT needs at this time. PT signing off.        If plan is discharge home, recommend the following:     Can travel by private vehicle        Equipment Recommendations Other (comment) (bariatric shower seat)  Recommendations for Other Services       Functional Status Assessment Patient has not had a recent decline in their functional status     Precautions / Restrictions Precautions Precautions: None Restrictions Weight Bearing Restrictions Per Provider Order: No      Mobility  Bed Mobility Overal bed mobility: Independent                  Transfers Overall transfer level: Independent Equipment used: None                    Ambulation/Gait Ambulation/Gait assistance: Independent Gait Distance (Feet): 800 Feet Assistive device: None Gait Pattern/deviations: WFL(Within Functional Limits) Gait velocity: functional Gait velocity interpretation: >2.62 ft/sec, indicative of community ambulatory   General Gait Details: steady step-through gait  Stairs            Wheelchair Mobility     Tilt Bed    Modified Rankin (Stroke Patients Only)       Balance Overall balance assessment: Independent                                           Pertinent Vitals/Pain Pain Assessment Pain Assessment: No/denies pain    Home Living Family/patient expects to be discharged  to:: Private residence Living Arrangements: Children;Parent Available Help at Discharge: Family Type of Home: Mobile home Home Access: Stairs to enter;Ramped entrance       Home Layout: One level Home Equipment: Agricultural consultant (2 wheels);Cane - single point      Prior Function Prior Level of Function : Independent/Modified Independent             Mobility Comments: PRN use of RW if her back is hurting ADLs Comments: independent, does not work     Extremity/Trunk Assessment   Upper Extremity Assessment Upper Extremity Assessment: Overall WFL for tasks assessed    Lower Extremity Assessment Lower Extremity Assessment: Overall WFL for tasks assessed    Cervical / Trunk Assessment Cervical / Trunk Assessment: Other exceptions Cervical / Trunk Exceptions: morbid obesity  Communication   Communication Communication: No apparent difficulties    Cognition Arousal: Alert Behavior During Therapy: WFL for tasks assessed/performed   PT - Cognitive impairments: No apparent impairments                         Following commands: Intact       Cueing Cueing Techniques: Verbal cues     General Comments General comments (skin integrity, edema, etc.): VSS on RA    Exercises  Assessment/Plan    PT Assessment Patient does not need any further PT services  PT Problem List         PT Treatment Interventions      PT Goals (Current goals can be found in the Care Plan section)       Frequency       Co-evaluation               AM-PAC PT "6 Clicks" Mobility  Outcome Measure Help needed turning from your back to your side while in a flat bed without using bedrails?: None Help needed moving from lying on your back to sitting on the side of a flat bed without using bedrails?: None Help needed moving to and from a bed to a chair (including a wheelchair)?: None Help needed standing up from a chair using your arms (e.g., wheelchair or bedside  chair)?: None Help needed to walk in hospital room?: None Help needed climbing 3-5 steps with a railing? : None 6 Click Score: 24    End of Session   Activity Tolerance: Patient tolerated treatment well Patient left: in bed;with call bell/phone within reach Nurse Communication: Mobility status PT Visit Diagnosis: Other abnormalities of gait and mobility (R26.89)    Time: 9604-5409 PT Time Calculation (min) (ACUTE ONLY): 12 min   Charges:   PT Evaluation $PT Eval Low Complexity: 1 Low   PT General Charges $$ ACUTE PT VISIT: 1 Visit         Arlyss Gandy, PT, DPT Acute Rehabilitation Office [phone]   Arlyss Gandy 07/07/2023, 5:32 PM

## 2023-07-07 NOTE — Progress Notes (Addendum)
 Progress Note   Patient: Kathleen Walsh EXB:[id number] DOB: December 20, 1980 DOA: 07/05/2023     1 DOS: the patient was seen and examined on 07/07/2023   Brief hospital course: Kathleen Walsh was admitted to the hospital with the working diagnosis of heart failure exacerbation.   43 yo female with the past medical history of hypertension, hyperlipidemia, T2DM, lupus anticoagulant, heart failure and obesity who presented with chest pain. Patient reported chest pain, pressure in nature, radiated to her left shoulder and back that occurred while she was cooking. About 5 days prior she noted worsening lower extremity and hand edema with no weight change. Positive orthopnea and PND. EMS was called and patient was brought to the hospital.  On her initial physical examination his blood pressure 123/43, HR 97, RR 16 and 02 saturation 95%.  Lungs with decreased breath sounds bilaterally, positive rales with no wheezing, heart with S1 and S2 present and regular with no gallops, rubs or murmurs, abdomen with no distention, no lower extremity edema.   High sensitive troponin 7   Chest radiograph with mild cardiomegaly, bilateral hilar vascular congestion, cephalization of the vasculature, bilateral interstitial infiltrates more at bases with no effusions.   EKG 104 bpm, normal axis, normal intervals, qtc 473, sinus rhythm with no significant ST segment changes, mild T wave inversion lead II, III and aVF.   Assessment and Plan: * Acute on chronic diastolic CHF (congestive heart failure) (HCC) Echocardiogram with preserved LV systolic function with EF 60 to 65%, RV systolic function preserved, trivial mitral valve regurgitation, RV size normal, with preserved systolic function, poor acoustic windows.   Documented urine output is 800 cc  Systolic blood pressure 100 mmHg range  Plan to continue diuresis with furosemide 120 mg IV bid.  SGLT 2 inh   Patient ruled out for acute coronary syndrome.    Hypokalemia Hyponatremia   Renal function with serum cr at 0,69 with K at 3,2 and serum bicarbonate at 24  Na 135 and Mg 2.3   Added 40 meq Kcl x2  Follow up renal function and electrolytes in am, continue diuresis with furosemide and SGLT 2 inh.   Essential hypertension Continue blood pressure monitoring.  Aggressive diuresis with IV furosemide.   Type 2 diabetes mellitus with hyperlipidemia (HCC) Uncontrolled hyperglycemia.  Continue glucose cover and monitoring with insulin sliding scale. Continue basal insulin with glargine, will decrease dose to 20 units to prevent hypoglycemia.  Her fasting glucose this am was 129 mg/dl.   Obesity, Class III, BMI 40-49.9 (morbid obesity) (HCC) OSA Calculated BMI 58,4  Continue Cpap.    Subjective: Patient continue to have generalized pain, dyspnea is improving along with edema.   Physical Exam: Vitals:   07/07/23 0432 07/07/23 0433 07/07/23 0717 07/07/23 1136  BP:   117/79 (!) 130/94  Pulse: 89 86 90 (!) 101  Resp:   19 18  Temp:   98 F (36.7 C) 98.6 F (37 C)  TempSrc:   (P) Oral Oral  SpO2: 95% 97%  98%  Weight:      Height:       Neurology awake and alert ENT with mild pallor Cardiovascular with S1 and S2 present and regular with no gallops, rubs or murmurs Respiratory with rales at bases with no wheezing or rhonchi Abdomen with no distention  Trace lower extremity edema  Data Reviewed:    Family Communication: no family at the bedside   Disposition: Status is: Inpatient Remains inpatient appropriate because: IV diuresis  Planned Discharge Destination: Home      Author: Coralie Keens, MD 07/07/2023 3:24 PM  For on call review [email or web address].

## 2023-07-07 NOTE — Assessment & Plan Note (Addendum)
 Resume lisinopril at the time of her discharge. Follow blood pressure as outpatient.

## 2023-07-07 NOTE — Hospital Course (Addendum)
 Kathleen Walsh was admitted to the hospital with the working diagnosis of heart failure exacerbation.   43 yo female with the past medical history of hypertension, hyperlipidemia, T2DM, lupus anticoagulant, heart failure and obesity who presented with chest pain. Patient reported chest pain, pressure in nature, radiated to her left shoulder and back that occurred while she was cooking. About 5 days prior she noted worsening lower extremity and hand edema with no weight change. Positive orthopnea and PND. EMS was called and patient was brought to the hospital.  On her initial physical examination his blood pressure 123/43, HR 97, RR 16 and 02 saturation 95%.  Lungs with decreased breath sounds bilaterally, positive rales with no wheezing, heart with S1 and S2 present and regular with no gallops, rubs or murmurs, abdomen with no distention, no lower extremity edema.   Na 136, K 3,6 Cl 97, bicarbonate 25 glucose 149 bun 21 cr 0,99  Mg 2.3  BNP 180 High sensitive troponin 7  Wbc 16,8 hgb 14.7 plt 268 Sars covid 19 negative Influenza negative   Urine analysis SG 1,012, negative protein, negative leukocytes and negative hgb.   Chest radiograph with mild cardiomegaly, bilateral hilar vascular congestion, cephalization of the vasculature, bilateral interstitial infiltrates more at bases with no effusions.   CT abdomen and pelvis with no acute abnormality in the abdomen or pelvis. Hepatic steatosis. No evidence of obstriction or volvulus.   EKG 104 bpm, normal axis, normal intervals, qtc 473, sinus rhythm with no significant ST segment changes, mild T wave inversion lead II, III and aVF.   03/08 patient responding well to diuresis.  03/09 continue to improve volume status. Anxiety improved with alprazolam.

## 2023-07-07 NOTE — Plan of Care (Signed)
  Problem: Metabolic: Goal: Ability to maintain appropriate glucose levels will improve Outcome: Progressing   Problem: Clinical Measurements: Goal: Ability to maintain clinical measurements within normal limits will improve Outcome: Progressing   Problem: Activity: Goal: Capacity to carry out activities will improve Outcome: Progressing

## 2023-07-07 NOTE — Assessment & Plan Note (Addendum)
 Hyponatremia   Renal function with serum cr at 0,70 with K at 3,3 and serum bicarbonate at 23.  Na 134 with Mg 2,5   Added 40 meq Kcl x3  Continue renal function monitoring.

## 2023-07-07 NOTE — Assessment & Plan Note (Addendum)
 OSA Calculated BMI 58,4  Continue Cpap.   Anxiety. Patient was placed on alprazolam as needed for anxiety, plan to follow up as outpatient.

## 2023-07-07 NOTE — Progress Notes (Signed)
   Patient Name: Kathleen Walsh Date of Encounter: 07/07/2023 [facility] Cardiologist: Jodelle Red, MD   Interval Summary  .    Urine output has significantly picked up and her abdominal tightness has improved.  No dyspnea at rest and she has walked in the hallway as well, without much difficulty. Creatinine has improved with diuresis. Muscles are sore, she is hypokalemic.  Vital Signs .    Vitals:   07/07/23 0431 07/07/23 0432 07/07/23 0433 07/07/23 0717  BP:    117/79  Pulse: 87 89 86 90  Resp:    19  Temp:    98 F (36.7 C)  TempSrc:    (P) Oral  SpO2: 97% 95% 97%   Weight:      Height:        Intake/Output Summary (Last 24 hours) at 07/07/2023 1115 Last data filed at 07/07/2023 0713 Gross per 24 hour  Intake 491.02 ml  Output 1600 ml  Net -1108.98 ml      07/07/2023    4:30 AM 07/05/2023    7:31 PM 05/11/2023   10:42 AM  Last 3 Weights  Weight (lbs) 351 lb 6.6 oz 334 lb 336 lb 9.6 oz  Weight (kg) 159.4 kg 151.501 kg 152.681 kg      Telemetry/ECG    Normal sinus rhythm- Personally Reviewed  Physical Exam .   GEN: No acute distress.  Morbidly obese Neck: Unable to evaluate JVD Cardiac: RRR, no murmurs, rubs, or gallops.  Respiratory: Clear to auscultation bilaterally. GI: Soft, nontender, mildly distended MS: No edema  Assessment & Plan .     Still hypervolemic.  I think we need to challenge her "dry weight" since I believe she has lost some true weight.  Continue IV diuretics at least another 24 hours. She has received potassium replacement, monitor electrolytes daily. For questions or updates, please contact [facility] Please consult [email or web address] for contact info under        Signed, Thurmon Fair, MD

## 2023-07-08 DIAGNOSIS — E1169 Type 2 diabetes mellitus with other specified complication: Secondary | ICD-10-CM | POA: Diagnosis not present

## 2023-07-08 DIAGNOSIS — E876 Hypokalemia: Secondary | ICD-10-CM | POA: Diagnosis not present

## 2023-07-08 DIAGNOSIS — I10 Essential (primary) hypertension: Secondary | ICD-10-CM | POA: Diagnosis not present

## 2023-07-08 DIAGNOSIS — I5033 Acute on chronic diastolic (congestive) heart failure: Secondary | ICD-10-CM | POA: Diagnosis not present

## 2023-07-08 LAB — BASIC METABOLIC PANEL
Anion gap: 10 (ref 5–15)
BUN: 18 mg/dL (ref 6–20)
CO2: 23 mmol/L (ref 22–32)
Calcium: 9 mg/dL (ref 8.9–10.3)
Chloride: 101 mmol/L (ref 98–111)
Creatinine, Ser: 0.7 mg/dL (ref 0.44–1.00)
GFR, Estimated: >60 mL/min (ref >60)
Glucose, Bld: 139 mg/dL — ABNORMAL HIGH (ref 70–99)
Potassium: 3.3 mmol/L — ABNORMAL LOW (ref 3.5–5.1)
Sodium: 134 mmol/L — ABNORMAL LOW (ref 135–145)

## 2023-07-08 LAB — GLUCOSE, CAPILLARY
Glucose-Capillary: 145 mg/dL — ABNORMAL HIGH (ref 70–99)
Glucose-Capillary: 178 mg/dL — ABNORMAL HIGH (ref 70–99)
Glucose-Capillary: 196 mg/dL — ABNORMAL HIGH (ref 70–99)
Glucose-Capillary: 182 mg/dL — ABNORMAL HIGH (ref 70–99)

## 2023-07-08 LAB — MAGNESIUM: Magnesium: 2.5 mg/dL — ABNORMAL HIGH (ref 1.7–2.4)

## 2023-07-08 MED ORDER — POTASSIUM CHLORIDE CRYS ER 20 MEQ PO TBCR
40.0000 meq | EXTENDED_RELEASE_TABLET | ORAL | Status: AC
  Administered 2023-07-08 (×3): 40 meq via ORAL
  Filled 2023-07-08 (×3): qty 2

## 2023-07-08 MED ORDER — POLYETHYLENE GLYCOL 3350 17 G PO PACK
17.0000 g | PACK | Freq: Every day | ORAL | Status: DC
  Administered 2023-07-08 – 2023-07-10 (×3): 17 g via ORAL
  Filled 2023-07-08 (×3): qty 1

## 2023-07-08 NOTE — Progress Notes (Signed)
   Patient Name: Kathleen Walsh Date of Encounter: 07/08/2023 [facility] Cardiologist: Jodelle Red, MD   Interval Summary  .    Good diuresis some cough seems to want to be d/c home   Vital Signs .    Vitals:   07/08/23 0022 07/08/23 0504 07/08/23 0505 07/08/23 0700  BP:  136/83  (!) 149/86  Pulse: 93 85 85 95  Resp:  18  18  Temp:  98.4 F (36.9 C)  98.1 F (36.7 C)  TempSrc:  Oral  Oral  SpO2: 98% 99% 97% 98%  Weight:  (!) 145.9 kg    Height:        Intake/Output Summary (Last 24 hours) at 07/08/2023 0927 Last data filed at 07/08/2023 0839 Gross per 24 hour  Intake 3 ml  Output 1900 ml  Net -1897 ml      07/08/2023    5:04 AM 07/07/2023    4:30 AM 07/05/2023    7:31 PM  Last 3 Weights  Weight (lbs) 321 lb 10.4 oz 351 lb 6.6 oz 334 lb  Weight (kg) 145.9 kg 159.4 kg 151.501 kg      Telemetry/ECG    Normal sinus rhythm- Personally Reviewed  Physical Exam .   GEN: No acute distress.  Morbidly obese Neck: Unable to evaluate JVD Cardiac: RRR, no murmurs, rubs, or gallops.  Respiratory: Clear to auscultation bilaterally. GI: Soft, nontender, mildly distended MS: Trace  edema  Assessment & Plan .     Diastolic CHF:  good diuresis TTE 03/03/23 EF 60-65% valves ok. CXR with mild CE and vascular congestion BNP only 180 Continue iv diuresis today. Change to home demedex 60 mg bid with 25 mg aldactone in am and likely ok to d/c in am BMET in am.   Will arrange outpatient f/u with Dr Cristal Deer  Will sign off See recs regarding diuretic changes for am and d/c        Signed, Charlton Haws, MD

## 2023-07-08 NOTE — Progress Notes (Addendum)
 Progress Note   Patient: Kathleen Walsh ZOX:[id number] DOB: 02/18/1981 DOA: 07/05/2023     2 DOS: the patient was seen and examined on 07/08/2023   Brief hospital course: [name] was admitted to the hospital with the working diagnosis of heart failure exacerbation.   43 yo female with the past medical history of hypertension, hyperlipidemia, T2DM, lupus anticoagulant, heart failure and obesity who presented with chest pain. Patient reported chest pain, pressure in nature, radiated to her left shoulder and back that occurred while she was cooking. About 5 days prior she noted worsening lower extremity and hand edema with no weight change. Positive orthopnea and PND. EMS was called and patient was brought to the hospital.  On her initial physical examination his blood pressure 123/43, HR 97, RR 16 and 02 saturation 95%.  Lungs with decreased breath sounds bilaterally, positive rales with no wheezing, heart with S1 and S2 present and regular with no gallops, rubs or murmurs, abdomen with no distention, no lower extremity edema.   Na 136, K 3,6 Cl 97, bicarbonate 25 glucose 149 bun 21 cr 0,99  Mg 2.3  BNP 180 High sensitive troponin 7  Wbc 16,8 hgb 14.7 plt 268 Sars covid 19 negative Influenza negative   Urine analysis SG 1,012, negative protein, negative leukocytes and negative hgb.   Chest radiograph with mild cardiomegaly, bilateral hilar vascular congestion, cephalization of the vasculature, bilateral interstitial infiltrates more at bases with no effusions.   CT abdomen and pelvis with no acute abnormality in the abdomen or pelvis. Hepatic steatosis. No evidence of obstriction or volvulus.   EKG 104 bpm, normal axis, normal intervals, qtc 473, sinus rhythm with no significant ST segment changes, mild T wave inversion lead II, III and aVF.   03/08 patient responding well to diuresis.   Assessment and Plan: * Acute on chronic diastolic CHF (congestive heart failure)  (HCC) Echocardiogram with preserved LV systolic function with EF 60 to 65%, RV systolic function preserved, trivial mitral valve regurgitation, RV size normal, with preserved systolic function, poor acoustic windows.   Documented urine output is 2,800 cc  Systolic blood pressure 100 mmHg range  Plan to continue diuresis with furosemide 120 mg IV bid.  SGLT 2 inh   Patient ruled out for acute coronary syndrome.   Hypokalemia Hyponatremia   Renal function with serum cr at 0,70 with K at 3,3 and serum bicarbonate at 23.  Na 134 with Mg 2,5   Added 40 meq Kcl x3  Continue renal function monitoring.   Essential hypertension Continue blood pressure monitoring.  Aggressive diuresis with IV furosemide.   Type 2 diabetes mellitus with hyperlipidemia (HCC) Uncontrolled hyperglycemia.  Fasting glucose this morning 139 mg/dl.   Continue glucose cover and monitoring with insulin sliding scale. Continue basal insulin with glargine, decreased dose to avoid hypoglycemia  Obesity, Class III, BMI 40-49.9 (morbid obesity) (HCC) OSA Calculated BMI 58,4  Continue Cpap.       Subjective: Patient with improvement in dyspnea and edema, decreased anxiety, continue to have aches and pains. She is out of the bed today to the chair.   Physical Exam: Vitals:   07/08/23 0022 07/08/23 0504 07/08/23 0505 07/08/23 0700  BP:  136/83  (!) 149/86  Pulse: 93 85 85 95  Resp:  18  18  Temp:  98.4 F (36.9 C)  98.1 F (36.7 C)  TempSrc:  Oral  Oral  SpO2: 98% 99% 97% 98%  Weight:  (!) 145.9 kg  Height:       Neurology awake and alert ENT with mild pallor with no icterus Cardiovascular with S1 and S2 present and regular with no gallops, rubs or murmurs Respiratory with mild rales at bases with poor inspiratory effort, no wheezing or rhonchi Abdomen with no distention  No lower extremity edema  Data Reviewed:    Family Communication: no family at the bedside   Disposition: Status is:  Inpatient Remains inpatient appropriate because: Iv diuresis   Planned Discharge Destination: Home      Author: Coralie Keens, MD 07/08/2023 11:53 AM  For on call review [email or web address].

## 2023-07-08 NOTE — Evaluation (Signed)
 Occupational Therapy Evaluation Patient Details Name: Kathleen Walsh MRN: [id number] DOB: 13-Mar-1981 Today's Date: 07/08/2023   History of Present Illness   43 y.o. female presents to [facility] hospital on 07/05/2023 with chest pain and worsening peripheral edema, along with orthopnea. Pt admitted for CHF exacerbation. PMH includes obesity, HLD, HTN, anemia, depression, DM, OSA.     Clinical Impressions Pt at this time reported feeling more fatigued then when they worked with Physical Therapy but was still able to complete ambulation with no DME but needed increase in rest breaks. Spoke with pt about modifications with the return home for activity. At this time she would like more reading materials about heart healthy diet to be able to use at home, nurse made aware. At this time pt reported no other concerns. Acute Occupational Therapy singing off. Thank you.      If plan is discharge home, recommend the following:         Functional Status Assessment   Patient has had a recent decline in their functional status and demonstrates the ability to make significant improvements in function in a reasonable and predictable amount of time.     Equipment Recommendations   BSC/3in1     Recommendations for Other Services         Precautions/Restrictions   Precautions Precautions: None Restrictions Weight Bearing Restrictions Per Provider Order: No     Mobility Bed Mobility Overal bed mobility: Independent                  Transfers Overall transfer level: Modified independent Equipment used: None               General transfer comment: increase in elevation      Balance Overall balance assessment: Mild deficits observed, not formally tested                                         ADL either performed or assessed with clinical judgement   ADL Overall ADL's : Needs assistance/impaired Eating/Feeding: Independent;Sitting   Grooming:  Wash/dry hands;Standing;Modified independent   Upper Body Bathing: Modified independent;Standing   Lower Body Bathing: Modified independent   Upper Body Dressing : Modified independent   Lower Body Dressing: Modified independent Lower Body Dressing Details (indicate cue type and reason): long sitting Toilet Transfer: Modified Independent   Toileting- Clothing Manipulation and Hygiene: Modified independent       Functional mobility during ADLs: Supervision/safety       Vision         Perception Perception: Within Functional Limits       Praxis Praxis: WFL       Pertinent Vitals/Pain Pain Assessment Pain Assessment: 0-10 Pain Score: 1  Pain Location: general aching Pain Descriptors / Indicators: Aching     Extremity/Trunk Assessment Upper Extremity Assessment Upper Extremity Assessment: Overall WFL for tasks assessed (RUE slightly decrease than the L due to past shoulder)   Lower Extremity Assessment Lower Extremity Assessment: Defer to PT evaluation   Cervical / Trunk Assessment Cervical / Trunk Assessment: Other exceptions Cervical / Trunk Exceptions: morbid obesity   Communication Communication Communication: No apparent difficulties   Cognition Arousal: Alert Behavior During Therapy: WFL for tasks assessed/performed Cognition: No apparent impairments  Following commands: Intact       Cueing  General Comments   Cueing Techniques: Verbal cues      Exercises     Shoulder Instructions      Home Living Family/patient expects to be discharged to:: Private residence Living Arrangements: Children;Parent Available Help at Discharge: Family Type of Home: Mobile home Home Access: Stairs to enter;Ramped entrance Entrance Stairs-Number of Steps: ramp for her parents   Home Layout: One level     Bathroom Shower/Tub: Chief Strategy Officer: Standard     Home Equipment: Agricultural consultant (2  wheels);Cane - single point          Prior Functioning/Environment Prior Level of Function : Independent/Modified Independent             Mobility Comments: PRN use of RW if her back is hurting ADLs Comments: independent, does not work    OT Problem List: Decreased activity tolerance   OT Treatment/Interventions:        OT Goals(Current goals can be found in the care plan section)   Acute Rehab OT Goals Patient Stated Goal: to eat better OT Goal Formulation: With patient Time For Goal Achievement: 07/22/23 Potential to Achieve Goals: Good   OT Frequency:       Co-evaluation              AM-PAC OT "6 Clicks" Daily Activity     Outcome Measure Help from another person eating meals?: None Help from another person taking care of personal grooming?: None Help from another person toileting, which includes using toliet, bedpan, or urinal?: None Help from another person bathing (including washing, rinsing, drying)?: None Help from another person to put on and taking off regular upper body clothing?: None Help from another person to put on and taking off regular lower body clothing?: None 6 Click Score: 24   End of Session Nurse Communication: Mobility status  Activity Tolerance: Patient tolerated treatment well Patient left: in bed  OT Visit Diagnosis: Unsteadiness on feet (R26.81);Repeated falls (R29.6);Other abnormalities of gait and mobility (R26.89);Muscle weakness (generalized) (M62.81)                Time: 5784-6962 OT Time Calculation (min): 32 min Charges:  OT General Charges $OT Visit: 1 Visit OT Evaluation $OT Eval Low Complexity: 1 Low OT Treatments $Self Care/Home Management : 8-22 mins  Presley Raddle OTR/L  Acute Rehab Services  [phone] office number   Alphia Moh 07/08/2023, 3:54 PM

## 2023-07-08 NOTE — Plan of Care (Signed)

## 2023-07-08 NOTE — Plan of Care (Signed)
  Problem: Coping: Goal: Ability to adjust to condition or change in health will improve Outcome: Progressing   Problem: Fluid Volume: Goal: Ability to maintain a balanced intake and output will improve Outcome: Progressing   Problem: Metabolic: Goal: Ability to maintain appropriate glucose levels will improve Outcome: Progressing   Problem: Tissue Perfusion: Goal: Adequacy of tissue perfusion will improve Outcome: Progressing   

## 2023-07-09 DIAGNOSIS — E876 Hypokalemia: Secondary | ICD-10-CM | POA: Diagnosis not present

## 2023-07-09 DIAGNOSIS — I10 Essential (primary) hypertension: Secondary | ICD-10-CM | POA: Diagnosis not present

## 2023-07-09 DIAGNOSIS — E1169 Type 2 diabetes mellitus with other specified complication: Secondary | ICD-10-CM | POA: Diagnosis not present

## 2023-07-09 DIAGNOSIS — I5033 Acute on chronic diastolic (congestive) heart failure: Secondary | ICD-10-CM | POA: Diagnosis not present

## 2023-07-09 LAB — GLUCOSE, CAPILLARY
Glucose-Capillary: 152 mg/dL — ABNORMAL HIGH (ref 70–99)
Glucose-Capillary: 184 mg/dL — ABNORMAL HIGH (ref 70–99)
Glucose-Capillary: 160 mg/dL — ABNORMAL HIGH (ref 70–99)
Glucose-Capillary: 186 mg/dL — ABNORMAL HIGH (ref 70–99)

## 2023-07-09 LAB — BASIC METABOLIC PANEL
Anion gap: 10 (ref 5–15)
BUN: 18 mg/dL (ref 6–20)
CO2: 21 mmol/L — ABNORMAL LOW (ref 22–32)
Calcium: 8.9 mg/dL (ref 8.9–10.3)
Chloride: 102 mmol/L (ref 98–111)
Creatinine, Ser: 0.58 mg/dL (ref 0.44–1.00)
GFR, Estimated: >60 mL/min (ref >60)
Glucose, Bld: 144 mg/dL — ABNORMAL HIGH (ref 70–99)
Potassium: 3.4 mmol/L — ABNORMAL LOW (ref 3.5–5.1)
Sodium: 133 mmol/L — ABNORMAL LOW (ref 135–145)

## 2023-07-09 LAB — MAGNESIUM: Magnesium: 2.3 mg/dL (ref 1.7–2.4)

## 2023-07-09 MED ORDER — OXYCODONE HCL 5 MG PO TABS
5.0000 mg | ORAL_TABLET | ORAL | Status: DC | PRN
  Administered 2023-07-09 – 2023-07-10 (×2): 5 mg via ORAL
  Filled 2023-07-09 (×2): qty 1

## 2023-07-09 MED ORDER — TORSEMIDE 20 MG PO TABS
60.0000 mg | ORAL_TABLET | Freq: Two times a day (BID) | ORAL | Status: DC
  Administered 2023-07-09: 60 mg via ORAL
  Filled 2023-07-09: qty 3

## 2023-07-09 MED ORDER — TORSEMIDE 20 MG PO TABS
60.0000 mg | ORAL_TABLET | Freq: Two times a day (BID) | ORAL | Status: DC
  Administered 2023-07-09 – 2023-07-10 (×2): 60 mg via ORAL
  Filled 2023-07-09 (×2): qty 3

## 2023-07-09 MED ORDER — POTASSIUM CHLORIDE CRYS ER 20 MEQ PO TBCR
40.0000 meq | EXTENDED_RELEASE_TABLET | ORAL | Status: DC
  Administered 2023-07-09 (×2): 40 meq via ORAL
  Filled 2023-07-09 (×2): qty 2

## 2023-07-09 MED ORDER — TORSEMIDE 20 MG PO TABS: 80.0000 mg | ORAL_TABLET | Freq: Two times a day (BID) | ORAL | Status: DC

## 2023-07-09 MED ORDER — SPIRONOLACTONE 25 MG PO TABS
25.0000 mg | ORAL_TABLET | Freq: Every day | ORAL | Status: DC
  Administered 2023-07-09 – 2023-07-10 (×2): 25 mg via ORAL
  Filled 2023-07-09 (×2): qty 1

## 2023-07-09 NOTE — Care Management (Cosign Needed)
    Durable Medical Equipment  (From admission, onward)           Start     Ordered   07/09/23 1123  For home use only DME Shower stool  Once       Comments: Bariatric shower seat. BMI 55   07/09/23 1123

## 2023-07-09 NOTE — Plan of Care (Signed)
  Problem: Fluid Volume: Goal: Ability to maintain a balanced intake and output will improve Outcome: Progressing   Problem: Skin Integrity: Goal: Risk for impaired skin integrity will decrease Outcome: Progressing   Problem: Activity: Goal: Risk for activity intolerance will decrease Outcome: Progressing   Problem: Nutrition: Goal: Adequate nutrition will be maintained Outcome: Progressing

## 2023-07-09 NOTE — Plan of Care (Signed)

## 2023-07-09 NOTE — Care Management Note (Signed)
 Case Management Note  Patient Details  Name: Kathleen Walsh MRN: [id number] Date of Birth: Sep 07, 1980  Subjective/Objective:  Pt presents with chest pain and worsening peripheral edema, along with orthopnea. Pt admitted for CHF exacerbation. PMH includes obesity, HLD, HTN, anemia, depression, DM, and OSA.   Action/Plan: Met with pt at bedside. Pt plans to return home with the support of her husband. Pt reports she drives. Her husband will be providing transportation at time of DC. Pt reports she has a weight scale for home. She denies any issues filling her prescriptions. PCP is Dr. Burnell Blanks. PT is recommending a bariatric shower seat. Pt agrees with DME. Pt doesn't have a preference for a DME agency. Will contact [facility] for DME referral. Pt agrees with agency. Pt denies any other DC needs. Contacted Ada with [facility] for referral.   Expected Discharge Date:                  Expected Discharge Plan:  Home/Self Care  In-House Referral:     Discharge planning Services  CM Consult  Post Acute Care Choice:    Choice offered to:     DME Arranged:  Shower stool DME Agency:  [facility]  HH Arranged:    HH Agency:     Status of Service:  Completed, signed off  If discussed at [employer] of Stay Meetings, dates discussed:    Additional Comments:  Isaias Cowman, RN 07/09/2023, 11:30 AM

## 2023-07-09 NOTE — Progress Notes (Signed)
 Progress Note   Patient: Kathleen Walsh FAO:[id number] DOB: 06/22/1980 DOA: 07/05/2023     3 DOS: the patient was seen and examined on 07/09/2023   Brief hospital course: Mrs. Moffa was admitted to the hospital with the working diagnosis of heart failure exacerbation.   43 yo female with the past medical history of hypertension, hyperlipidemia, T2DM, lupus anticoagulant, heart failure and obesity who presented with chest pain. Patient reported chest pain, pressure in nature, radiated to her left shoulder and back that occurred while she was cooking. About 5 days prior she noted worsening lower extremity and hand edema with no weight change. Positive orthopnea and PND. EMS was called and patient was brought to the hospital.  On her initial physical examination his blood pressure 123/43, HR 97, RR 16 and 02 saturation 95%.  Lungs with decreased breath sounds bilaterally, positive rales with no wheezing, heart with S1 and S2 present and regular with no gallops, rubs or murmurs, abdomen with no distention, no lower extremity edema.   Na 136, K 3,6 Cl 97, bicarbonate 25 glucose 149 bun 21 cr 0,99  Mg 2.3  BNP 180 High sensitive troponin 7  Wbc 16,8 hgb 14.7 plt 268 Sars covid 19 negative Influenza negative   Urine analysis SG 1,012, negative protein, negative leukocytes and negative hgb.   Chest radiograph with mild cardiomegaly, bilateral hilar vascular congestion, cephalization of the vasculature, bilateral interstitial infiltrates more at bases with no effusions.   CT abdomen and pelvis with no acute abnormality in the abdomen or pelvis. Hepatic steatosis. No evidence of obstriction or volvulus.   EKG 104 bpm, normal axis, normal intervals, qtc 473, sinus rhythm with no significant ST segment changes, mild T wave inversion lead II, III and aVF.   03/08 patient responding well to diuresis.  03/09 continue to improve volume status. Anxiety improved with alprazolam.   Assessment and Plan: *  Acute on chronic diastolic CHF (congestive heart failure) (HCC) Echocardiogram with preserved LV systolic function with EF 60 to 65%, RV systolic function preserved, trivial mitral valve regurgitation, RV size normal, with preserved systolic function, poor acoustic windows.   Documented urine output is 3,250cc  Systolic blood pressure 100 mmHg range  Plan to continue diuresis with oral loop diuretic with torsemide 60 mg po bid ( at home may do 80 and 60 mg) and spironolactone 25 mg daily.  Continue dapagliflozin, will resume lisinopril at the time of discharge.   Patient ruled out for acute coronary syndrome.   Hypokalemia Hyponatremia   Renal function with serum cr at 0,58 with K at 3,4 and serum bicarbonate at 21.  Na 133 and Mg 2.3   Added 40 meq Kcl x2  Follow up renal function and electrolytes in am.   Essential hypertension Continue blood pressure monitoring.  Change to oral loop diuretic.  Resume lisinopril tomorrow.    Type 2 diabetes mellitus with hyperlipidemia (HCC) Uncontrolled hyperglycemia.  Fasting glucose this morning 144 mg/dl.   Continue glucose cover and monitoring with insulin sliding scale. Continue basal insulin with glargine, decreased dose to avoid hypoglycemia  Obesity, Class III, BMI 40-49.9 (morbid obesity) (HCC) OSA Calculated BMI 58,4  Continue Cpap.        Subjective: Patient with improvement in dyspnea and edema, no PND or orthopnea, no chest pain, anxiety has improved with alprazolam.   Physical Exam: Vitals:   07/09/23 0411 07/09/23 0424 07/09/23 0725 07/09/23 1146  BP: 113/76  122/84 108/64  Pulse: 89  91 93  Resp: 18   20  Temp: 98.4 F (36.9 C)  97.7 F (36.5 C) 98.8 F (37.1 C)  TempSrc: Oral  Oral Oral  SpO2: 99%  99% 97%  Weight:  (!) 150.6 kg    Height:       Neurology awake and alert ENT with mild pallor Cardiovascular with S1 and S2 present and regular with no gallops, rubs or murmurs No JVD Trace lower extremity  edema Respiratory with mild rales with no wheezing, no rhonchi Abdomen with no distention  Data Reviewed:    Family Communication: no family at the bedside   Disposition: Status is: Inpatient Remains inpatient appropriate because: diuresis   Planned Discharge Destination: Home      Author: Coralie Keens, MD 07/09/2023 2:51 PM  For on call review [email or web address].

## 2023-07-10 ENCOUNTER — Other Ambulatory Visit (HOSPITAL_COMMUNITY): Payer: Self-pay

## 2023-07-10 DIAGNOSIS — I5033 Acute on chronic diastolic (congestive) heart failure: Secondary | ICD-10-CM | POA: Diagnosis not present

## 2023-07-10 DIAGNOSIS — I10 Essential (primary) hypertension: Secondary | ICD-10-CM | POA: Diagnosis not present

## 2023-07-10 DIAGNOSIS — E1169 Type 2 diabetes mellitus with other specified complication: Secondary | ICD-10-CM | POA: Diagnosis not present

## 2023-07-10 DIAGNOSIS — E876 Hypokalemia: Secondary | ICD-10-CM | POA: Diagnosis not present

## 2023-07-10 LAB — BASIC METABOLIC PANEL
Anion gap: 14 (ref 5–15)
BUN: 22 mg/dL — ABNORMAL HIGH (ref 6–20)
CO2: 21 mmol/L — ABNORMAL LOW (ref 22–32)
Calcium: 9 mg/dL (ref 8.9–10.3)
Chloride: 99 mmol/L (ref 98–111)
Creatinine, Ser: 0.84 mg/dL (ref 0.44–1.00)
GFR, Estimated: >60 mL/min (ref >60)
Glucose, Bld: 156 mg/dL — ABNORMAL HIGH (ref 70–99)
Potassium: 3.4 mmol/L — ABNORMAL LOW (ref 3.5–5.1)
Sodium: 134 mmol/L — ABNORMAL LOW (ref 135–145)

## 2023-07-10 LAB — GLUCOSE, CAPILLARY
Glucose-Capillary: 194 mg/dL — ABNORMAL HIGH (ref 70–99)
Glucose-Capillary: 177 mg/dL — ABNORMAL HIGH (ref 70–99)

## 2023-07-10 LAB — MAGNESIUM: Magnesium: 2.4 mg/dL (ref 1.7–2.4)

## 2023-07-10 MED ORDER — ALPRAZOLAM 0.5 MG PO TABS
0.5000 mg | ORAL_TABLET | Freq: Every day | ORAL | 0 refills | Status: DC | PRN
  Filled 2023-07-10: qty 10, 10d supply, fill #0

## 2023-07-10 MED ORDER — POTASSIUM CHLORIDE CRYS ER 20 MEQ PO TBCR
40.0000 meq | EXTENDED_RELEASE_TABLET | ORAL | Status: AC
  Administered 2023-07-10 (×2): 40 meq via ORAL
  Filled 2023-07-10 (×2): qty 2

## 2023-07-10 NOTE — Discharge Summary (Signed)
 Physician Discharge Summary   Patient: Kathleen Walsh MRN: [id number] DOB: 1980-10-20  Admit date:     07/05/2023  Discharge date: 07/10/23  Discharge Physician: York Ram Rondarius Kadrmas   PCP: [facility]   Recommendations at discharge:    Plan to continue with torsemide 60 mg po bid, and plan to increase to 80 mg q am and 60 mg q pm in case of volume overload, weight increase 2 to 3 lbs in 24 hrs and 5 lbs in 7 days.  Advised to follow up a salt and fluid restricted diet.  Follow up renal function and electrolytes in 7 days Follow up with [facility] in 7 to 10 days. Follow up with Cardiology as scheduled.    Discharge Diagnoses: Principal Problem:   Acute on chronic diastolic CHF (congestive heart failure) (HCC) Active Problems:   Hypokalemia   Essential hypertension   Type 2 diabetes mellitus with hyperlipidemia (HCC)   Obesity, Class III, BMI 40-49.9 (morbid obesity) (HCC)  Resolved Problems:   * No resolved hospital problems. [facility] Course: Mrs. Foskey was admitted to the hospital with the working diagnosis of heart failure exacerbation.   43 yo female with the past medical history of hypertension, hyperlipidemia, T2DM, lupus anticoagulant, heart failure and obesity who presented with chest pain. Patient reported chest pain, pressure in nature, radiated to her left shoulder and back that occurred while she was cooking. About 5 days prior she noted worsening lower extremity and hand edema with no weight change. Positive orthopnea and PND. Apparently she has not been adherent to a salt and fluid restricted diet.  EMS was called and patient was brought to the hospital.  On her initial physical examination his blood pressure 123/43, HR 97, RR 16 and 02 saturation 95%.  Lungs with decreased breath sounds bilaterally, positive rales with no wheezing, heart with S1 and S2 present and regular with no gallops, rubs or murmurs, abdomen with no  distention, no lower extremity edema.   Na 136, K 3,6 Cl 97, bicarbonate 25 glucose 149 bun 21 cr 0,99  Mg 2.3  BNP 180 High sensitive troponin 7  Wbc 16,8 hgb 14.7 plt 268 Sars covid 19 negative Influenza negative   Urine analysis SG 1,012, negative protein, negative leukocytes and negative hgb.   Chest radiograph with mild cardiomegaly, bilateral hilar vascular congestion, cephalization of the vasculature, bilateral interstitial infiltrates more at bases with no effusions.   CT abdomen and pelvis with no acute abnormality in the abdomen or pelvis. Hepatic steatosis. No evidence of obstriction or volvulus.   EKG 104 bpm, normal axis, normal intervals, qtc 473, sinus rhythm with no significant ST segment changes, mild T wave inversion lead II, III and aVF.   03/08 patient responding well to diuresis.  03/09 continue to improve volume status. Anxiety improved with alprazolam.  03/10 continue to improved volume status, she has been transitioned to oral loop diuretic. Plan to follow up as outpatient.   Assessment and Plan: * Acute on chronic diastolic CHF (congestive heart failure) (HCC) Echocardiogram with preserved LV systolic function with EF 60 to 65%, RV systolic function preserved, trivial mitral valve regurgitation, RV size normal, with preserved systolic function, poor acoustic windows.   Patient was placed on IV furosemide for diuresis, negative fluid balance was achieved, - 11,007 ml, with significant improvement in her symptoms.   Plan to continue diuresis with oral loop diuretic with torsemide 60 mg po bid and spironolactone 25 mg  daily.  Continue dapagliflozin and lisinopril.  In case of volume overload instructed to take extra 20 mg torsemide in am.   Patient ruled out for acute coronary syndrome.   Hypokalemia Hyponatremia   Her volume has improved, renal function at the time of discharge serum cr is 0,84 with K at 3,4 and serum bicarbonate at 21  Na 134 and Mg 2,4    Patient will get 40 meq Kcl x2 prior to her discharge.  Follow up renal function and electrolytes as outpatient.  Plan to continue torsemide, spironolactone and empagliflozin.  Essential hypertension Resume lisinopril at the time of her discharge. Follow blood pressure as outpatient.    Type 2 diabetes mellitus with hyperlipidemia (HCC) Uncontrolled hyperglycemia.  01/2023 Hgb A1c 7,5   Patient was placed on insulin sliding scale for glucose cover and monitoring. Continue basal insulin with glargine, during her hospitalization her dose was decreased dose to avoid hypoglycemia  Obesity, Class III, BMI 40-49.9 (morbid obesity) (HCC) OSA Calculated BMI 58,4  Continue Cpap.   Anxiety. Patient was placed on alprazolam as needed for anxiety, plan to follow up as outpatient.        Consultants: cardiology  Procedures performed: none   Disposition: Home Diet recommendation:  Discharge Diet Orders (From admission, onward)     Start     Ordered   07/10/23 0000  Diet - low sodium heart healthy        07/10/23 1258           Cardiac and Carb modified diet DISCHARGE MEDICATION: Allergies as of 07/10/2023       Reactions   Augmentin [amoxicillin-pot Clavulanate] Anaphylaxis   Amoxicillin Nausea And Vomiting   Has patient had a PCN reaction causing immediate rash, facial/tongue/throat swelling, SOB or lightheadedness with hypotension: No Has patient had a PCN reaction causing severe rash involving mucus membranes or skin necrosis: No Has patient had a PCN reaction that required hospitalization: No Has patient had a PCN reaction occurring within the last 10 years: Yes If all of the above answers are "NO", then may proceed with Cephalosporin use.   Metformin And Related Nausea And Vomiting   Ozempic (0.25 Or 0.5 Mg-dose) [semaglutide(0.25 Or 0.5mg -dos)] Nausea And Vomiting   Trulicity [dulaglutide] Nausea And Vomiting        Medication List     TAKE these medications     ALPRAZolam 0.5 MG tablet Commonly known as: XANAX Take 1 tablet (0.5 mg total) by mouth daily as needed for anxiety.   dapagliflozin propanediol 10 MG Tabs tablet Commonly known as: FARXIGA Take 1 tablet (10 mg total) by mouth daily.   insulin glargine 100 UNIT/ML injection Commonly known as: LANTUS Inject 85 Units into the skin at bedtime.   levocetirizine 5 MG tablet Commonly known as: XYZAL Take 5 mg by mouth daily as needed for allergies.   lisinopril 20 MG tablet Commonly known as: ZESTRIL Take 20 mg by mouth daily.   melatonin 5 MG Tabs Take 5 mg by mouth at bedtime as needed (insomnia).   NovoLOG FlexPen 100 UNIT/ML FlexPen Generic drug: insulin aspart Inject 20-50 Units into the skin 3 (three) times daily before meals. Per sliding scale   ondansetron 8 MG tablet Commonly known as: ZOFRAN TAKE 1 TABLET (8 MG TOTAL) BY MOUTH 3 (THREE) TIMES DAILY AS NEEDED FOR NAUSEA OR VOMITING.   oxyCODONE 5 MG immediate release tablet Commonly known as: Oxy IR/ROXICODONE Take 1 tablet (5 mg total) by mouth every 4 (  four) hours as needed for moderate pain (pain score 4-6).   potassium chloride 10 MEQ tablet Commonly known as: KLOR-CON M Take 2 tablets (20 mEq total) by mouth daily.   rizatriptan 10 MG disintegrating tablet Commonly known as: MAXALT-MLT Take 10 mg by mouth as needed for migraine.   rosuvastatin 40 MG tablet Commonly known as: CRESTOR Take 40 mg by mouth daily.   spironolactone 25 MG tablet Commonly known as: ALDACTONE Take 1 tablet (25 mg total) by mouth daily.   torsemide 20 MG tablet Commonly known as: DEMADEX Take 3 tablets (60 mg total) by mouth 2 (two) times daily.               Durable Medical Equipment  (From admission, onward)           Start     Ordered   07/09/23 1123  For home use only DME Shower stool  Once       Comments: Bariatric shower seat. BMI 55   07/09/23 1123            Follow-up Information     [facility] Follow up.   Why: [facility] will provide the shower seat. Contact information: [address] [place] [address] [phone]                Discharge Exam: Filed Weights   07/08/23 0504 07/09/23 0424 07/10/23 0514  Weight: (!) 145.9 kg (!) 150.6 kg (!) 149.2 kg   BP 121/67 (BP Location: Right Arm)   Pulse 96   Temp 98 F (36.7 C) (Oral)   Resp 18   Ht 5\' 5"  (1.651 m)   Wt (!) 149.2 kg   LMP 08/31/2015   SpO2 96%   BMI 54.74 kg/m   Patient is feeling better, her dyspnea and edema have improved, no chest pain, no PND or orthopnea.   Neurology awake and alert ENT with no pallor Cardiovascular with S1 and S2 present and regular with no gallops, rubs or murmurs Respiratory with no rales or wheezing, no rhonchi Abdomen with no distention  No lower extremity edema   Condition at discharge: stable  The results of significant diagnostics from this hospitalization (including imaging, microbiology, ancillary and laboratory) are listed below for reference.   Imaging Studies: CT ABDOMEN PELVIS WO CONTRAST Result Date: 07/06/2023 CLINICAL DATA:  Left lower quadrant abdominal pain EXAM: CT ABDOMEN AND PELVIS WITHOUT CONTRAST TECHNIQUE: Multidetector CT imaging of the abdomen and pelvis was performed following the standard protocol without IV contrast. RADIATION DOSE REDUCTION: This exam was performed according to the departmental dose-optimization program which includes automated exposure control, adjustment of the mA and/or kV according to patient size and/or use of iterative reconstruction technique. COMPARISON:  CT 08/03/2022 FINDINGS: Lower chest: No acute abnormality. Hepatobiliary: Hepatic steatosis. Unremarkable gallbladder and biliary tree. Pancreas: Unremarkable. Spleen: The previous splenic lesion is not well visualized without IV contrast. No acute abnormality. Adrenals/Urinary Tract: Normal adrenal glands. No urinary calculi or  hydronephrosis. Bladder is unremarkable. 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 Normal caliber large and small bowel. No bowel wall thickening. The appendix is normal.Stomach is within normal limits. Vascular/Lymphatic: No significant vascular findings are present. No enlarged abdominal or pelvic lymph nodes. Reproductive: Hysterectomy. Other: No free intraperitoneal fluid or air. Musculoskeletal: No acute fracture. IMPRESSION: 1. No acute abnormality in the abdomen or pelvis. 2. Hepatic steatosis. 3. Duodenal  malrotation.  No evidence of obstruction or volvulus. Electronically Signed   By: Minerva Fester M.D.   On: 07/06/2023 03:25   DG Chest 2 View Result Date: 07/05/2023 CLINICAL DATA:  Chest pain.  Leg swelling.  History of CHF. EXAM: CHEST - 2 VIEW COMPARISON:  Radiograph 03/02/2023 FINDINGS: Upper normal heart size. Stable mediastinal contours. Slight vascular congestion without pulmonary edema. No large pleural effusion or focal airspace disease. No pneumothorax. Soft tissue attenuation from habitus limits assessment. IMPRESSION: Upper normal heart size with vascular congestion. Electronically Signed   By: Narda Rutherford M.D.   On: 07/05/2023 22:19    Microbiology: Results for orders placed or performed during the hospital encounter of 07/05/23  Resp panel by RT-PCR (RSV, Flu A&B, Covid) Anterior Nasal Swab     Status: None   Collection Time: 07/06/23  4:18 AM   Specimen: Anterior Nasal Swab  Result Value Ref Range Status   SARS Coronavirus 2 by RT PCR NEGATIVE NEGATIVE Final   Influenza A by PCR NEGATIVE NEGATIVE Final   Influenza B by PCR NEGATIVE NEGATIVE Final    Comment: (NOTE) The Xpert Xpress SARS-CoV-2/FLU/RSV plus assay is intended as an aid in the diagnosis of influenza from Nasopharyngeal swab specimens and should not be used as a sole basis for treatment. Nasal washings and aspirates are  unacceptable for Xpert Xpress SARS-CoV-2/FLU/RSV testing.  Fact Sheet for Patients: BloggerCourse.com  Fact Sheet for Healthcare Providers: SeriousBroker.it  This test is not yet approved or cleared by the [place] FDA and has been authorized for detection and/or diagnosis of SARS-CoV-2 by FDA under an Emergency Use Authorization (EUA). This EUA will remain in effect (meaning this test can be used) for the duration of the COVID-19 declaration under Section 564(b)(1) of the Act, 21 U.S.C. section 360bbb-3(b)(1), unless the authorization is terminated or revoked.     Resp Syncytial Virus by PCR NEGATIVE NEGATIVE Final    Comment: (NOTE) Fact Sheet for Patients: BloggerCourse.com  Fact Sheet for Healthcare Providers: SeriousBroker.it  This test is not yet approved or cleared by the [place] FDA and has been authorized for detection and/or diagnosis of SARS-CoV-2 by FDA under an Emergency Use Authorization (EUA). This EUA will remain in effect (meaning this test can be used) for the duration of the COVID-19 declaration under Section 564(b)(1) of the Act, 21 U.S.C. section 360bbb-3(b)(1), unless the authorization is terminated or revoked.  Performed at [facility], [address], [place], [place] [address]     Labs: CBC: Recent Labs  Lab 07/05/23 2100 07/07/23 0242  WBC 16.8* 11.2*  HGB 14.7 13.1  HCT 46.3* 41.2  MCV 83.9 81.9  PLT 268 245   Basic Metabolic Panel: Recent Labs  Lab 07/06/23 1359 07/07/23 0242 07/08/23 0239 07/09/23 0335 07/10/23 0219  NA 134* 135 134* 133* 134*  K 3.8 3.2* 3.3* 3.4* 3.4*  CL 95* 98 101 102 99  CO2 24 24 23  21* 21*  GLUCOSE 220* 129* 139* 144* 156*  BUN 22* 19 18 18  22*  CREATININE 1.06* 0.69 0.70 0.58 0.84  CALCIUM 9.3 9.0 9.0 8.9 9.0  MG 2.3  --  2.5* 2.3 2.4   Liver Function Tests: No results for  input(s): "AST", "ALT", "ALKPHOS", "BILITOT", "PROT", "ALBUMIN" in the last 168 hours. CBG: Recent Labs  Lab 07/09/23 1143 07/09/23 1545 07/09/23 2034 07/10/23 0521 07/10/23 1143  GLUCAP 184* 160* 186* 194* 177*    Discharge time spent: greater than 30 minutes.  Signed: Coralie Keens, MD [facility] 07/10/2023

## 2023-07-10 NOTE — Plan of Care (Signed)
  Problem: Education: Goal: Ability to describe self-care measures that may prevent or decrease complications (Diabetes Survival Skills Education) will improve Outcome: Completed/Met Goal: Individualized Educational Video(s) Outcome: Completed/Met   Problem: Coping: Goal: Ability to adjust to condition or change in health will improve Outcome: Completed/Met   Problem: Fluid Volume: Goal: Ability to maintain a balanced intake and output will improve Outcome: Completed/Met   Problem: Health Behavior/Discharge Planning: Goal: Ability to identify and utilize available resources and services will improve Outcome: Completed/Met Goal: Ability to manage health-related needs will improve Outcome: Completed/Met   Problem: Metabolic: Goal: Ability to maintain appropriate glucose levels will improve Outcome: Completed/Met   Problem: Nutritional: Goal: Maintenance of adequate nutrition will improve Outcome: Completed/Met Goal: Progress toward achieving an optimal weight will improve Outcome: Completed/Met   Problem: Skin Integrity: Goal: Risk for impaired skin integrity will decrease Outcome: Completed/Met   Problem: Tissue Perfusion: Goal: Adequacy of tissue perfusion will improve Outcome: Completed/Met   Problem: Education: Goal: Knowledge of General Education information will improve Description: Including pain rating scale, medication(s)/side effects and non-pharmacologic comfort measures Outcome: Completed/Met   Problem: Health Behavior/Discharge Planning: Goal: Ability to manage health-related needs will improve Outcome: Completed/Met   Problem: Clinical Measurements: Goal: Ability to maintain clinical measurements within normal limits will improve Outcome: Completed/Met Goal: Will remain free from infection Outcome: Completed/Met Goal: Diagnostic test results will improve Outcome: Completed/Met Goal: Respiratory complications will improve Outcome: Completed/Met Goal:  Cardiovascular complication will be avoided Outcome: Completed/Met   Problem: Activity: Goal: Risk for activity intolerance will decrease Outcome: Completed/Met   Problem: Nutrition: Goal: Adequate nutrition will be maintained Outcome: Completed/Met   Problem: Coping: Goal: Level of anxiety will decrease Outcome: Completed/Met   Problem: Elimination: Goal: Will not experience complications related to bowel motility Outcome: Completed/Met Goal: Will not experience complications related to urinary retention Outcome: Completed/Met   Problem: Pain Managment: Goal: General experience of comfort will improve and/or be controlled Outcome: Completed/Met   Problem: Safety: Goal: Ability to remain free from injury will improve Outcome: Completed/Met   Problem: Skin Integrity: Goal: Risk for impaired skin integrity will decrease Outcome: Completed/Met   Problem: Education: Goal: Ability to demonstrate management of disease process will improve Outcome: Completed/Met Goal: Ability to verbalize understanding of medication therapies will improve Outcome: Completed/Met Goal: Individualized Educational Video(s) Outcome: Completed/Met   Problem: Activity: Goal: Capacity to carry out activities will improve Outcome: Completed/Met   Problem: Cardiac: Goal: Ability to achieve and maintain adequate cardiopulmonary perfusion will improve Outcome: Completed/Met

## 2023-07-10 NOTE — Progress Notes (Signed)
 Discharge instructions reviewed with Kathleen Walsh.  Copy of instructions given to Kathleen Walsh. [facility] has filled her script and will be picked up on the way to the discharge lounge. Kathleen Walsh has called her husband and will be picking her up, Kathleen Walsh to let him know to come to the discharge lounge entrance, west side. Kathleen Walsh getting dressed, lounge transport contacted to pick up Kathleen Walsh.  Kathleen Walsh's bariatric shower chair will be delivered to the Kathleen Walsh's home per CM, Kathleen Walsh informed by CM.   Kathleen Walsh will be d/c'd via wheelchair with belongings and will be escorted by d/c lounge transport.  Annice Needy, RN SWOT

## 2023-07-10 NOTE — Progress Notes (Signed)
 Mobility Specialist Progress Note:   07/10/23 0915  Mobility  Activity Ambulated independently in hallway  Level of Assistance Independent  Assistive Device None  Distance Ambulated (ft) 500 ft  Activity Response Tolerated well  Mobility Referral Yes  Mobility visit 1 Mobility  Mobility Specialist Start Time (ACUTE ONLY) 0915  Mobility Specialist Stop Time (ACUTE ONLY) 0925  Mobility Specialist Time Calculation (min) (ACUTE ONLY) 10 min   Pt received ambulating in hallway independently. Displayed minor SOB, however SpO2 99% on RA. Pt left sitting in chair with all needs met.   Addison Lank Mobility Specialist Please contact via SecureChat or  Rehab office at [phone]

## 2023-07-10 NOTE — TOC Transition Note (Addendum)
 Transition of Care [facility]) - Discharge Note   Patient Details  Name: Kathleen Walsh MRN: [id number] Date of Birth: Feb 06, 1981  Transition of Care [facility]) CM/SW Contact:  Leone Haven, RN Phone Number: 07/10/2023, 1:04 PM   Clinical Narrative:    For dc today, NCM called [employer] to check on bariatric shower chair, was it delivered to the home or will be be delivered to the room. Spoke with Ian Malkin with [employer] they did not have any thing, NCM made referral to [facility] with [employer], patient is ok with [employer]. This will be delivered to the house for patient.  TOC pharmacy to fill med.   Final next level of care: Home/Self Care     Patient Goals and CMS Choice Patient states their goals for this hospitalization and ongoing recovery are:: Get better   Choice offered to / list presented to : Patient      Discharge Placement                       Discharge Plan and Services Additional resources added to the After Visit Summary for     Discharge Planning Services: CM Consult Post Acute Care Choice: Durable Medical Equipment          DME Arranged: Shower stool DME Agency: [employer] Date DME Agency Contacted: 07/09/23 Time DME Agency Contacted: 1135 Representative spoke with at DME Agency: [employer]            Social Drivers of Health (SDOH) Interventions SDOH Screenings   Food Insecurity: No Food Insecurity (07/06/2023)  Housing: Low Risk  (07/06/2023)  Transportation Needs: No Transportation Needs (07/06/2023)  Utilities: Not At Risk (07/06/2023)  Alcohol Screen: Low Risk  (03/06/2023)  Financial Resource Strain: Low Risk  (04/11/2023)  Tobacco Use: Medium Risk (07/05/2023)  Health Literacy: Adequate Health Literacy (04/11/2023)     Readmission Risk Interventions    12/21/2022   11:01 AM 12/16/2022    1:42 PM  Readmission Risk Prevention Plan  Post Dischage Appt Complete   Medication Screening Complete   Transportation Screening Complete Complete  PCP or Specialist Appt  within 5-7 Days  Complete  Home Care Screening  Complete  Medication Review (RN CM)  Referral to Pharmacy

## 2023-07-10 NOTE — Plan of Care (Signed)
   Problem: Clinical Measurements: Goal: Respiratory complications will improve Outcome: Progressing   Problem: Activity: Goal: Risk for activity intolerance will decrease Outcome: Progressing   Problem: Coping: Goal: Level of anxiety will decrease Outcome: Progressing

## 2023-07-24 ENCOUNTER — Telehealth (HOSPITAL_COMMUNITY): Payer: Self-pay

## 2023-07-24 NOTE — Telephone Encounter (Signed)
 Called to confirm/remind patient of their appointment at the [facility] on 07/25/2023 1:30.   Appointment:   [x] Confirmed  [] Left mess   [] No answer/No voice mail  [] Phone not in service  Patient reminded to bring all medications and/or complete list.  Confirmed patient has transportation. Gave directions, instructed to utilize valet parking.

## 2023-07-25 ENCOUNTER — Encounter (HOSPITAL_COMMUNITY)

## 2023-07-27 ENCOUNTER — Other Ambulatory Visit (HOSPITAL_COMMUNITY): Payer: Self-pay

## 2023-07-28 ENCOUNTER — Telehealth (HOSPITAL_COMMUNITY): Payer: Self-pay

## 2023-07-28 NOTE — Telephone Encounter (Signed)
 Called to confirm/remind patient of their appointment at the [facility] on 07/31/2023 2:00.   Appointment:   [x] Confirmed  [] Left mess   [] No answer/No voice mail  [] Phone not in service  Patient reminded to bring all medications and/or complete list.  Confirmed patient has transportation. Gave directions, instructed to utilize valet parking.

## 2023-07-31 ENCOUNTER — Telehealth (HOSPITAL_COMMUNITY): Payer: Self-pay

## 2023-07-31 ENCOUNTER — Ambulatory Visit (HOSPITAL_COMMUNITY)
Admission: RE | Admit: 2023-07-31 | Discharge: 2023-07-31 | Disposition: A | Discharge status: Home / Self Care | Source: Ambulatory Visit | Attending: Adult Health | Admitting: Adult Health

## 2023-07-31 ENCOUNTER — Encounter (HOSPITAL_COMMUNITY): Payer: Self-pay

## 2023-07-31 VITALS — BP 110/78 | HR 96 | Ht 65.0 in | Wt 350.0 lb

## 2023-07-31 DIAGNOSIS — Z794 Long term (current) use of insulin: Secondary | ICD-10-CM | POA: Diagnosis not present

## 2023-07-31 DIAGNOSIS — E119 Type 2 diabetes mellitus without complications: Secondary | ICD-10-CM | POA: Insufficient documentation

## 2023-07-31 DIAGNOSIS — R9431 Abnormal electrocardiogram [ECG] [EKG]: Secondary | ICD-10-CM | POA: Diagnosis not present

## 2023-07-31 DIAGNOSIS — I110 Hypertensive heart disease with heart failure: Secondary | ICD-10-CM | POA: Diagnosis present

## 2023-07-31 DIAGNOSIS — Z6841 Body Mass Index (BMI) 40.0 and over, adult: Secondary | ICD-10-CM | POA: Insufficient documentation

## 2023-07-31 DIAGNOSIS — I272 Pulmonary hypertension, unspecified: Secondary | ICD-10-CM | POA: Diagnosis not present

## 2023-07-31 DIAGNOSIS — I5032 Chronic diastolic (congestive) heart failure: Secondary | ICD-10-CM | POA: Diagnosis not present

## 2023-07-31 DIAGNOSIS — G4733 Obstructive sleep apnea (adult) (pediatric): Secondary | ICD-10-CM | POA: Diagnosis not present

## 2023-07-31 DIAGNOSIS — Z7984 Long term (current) use of oral hypoglycemic drugs: Secondary | ICD-10-CM | POA: Diagnosis not present

## 2023-07-31 DIAGNOSIS — I10 Essential (primary) hypertension: Secondary | ICD-10-CM

## 2023-07-31 DIAGNOSIS — E118 Type 2 diabetes mellitus with unspecified complications: Secondary | ICD-10-CM

## 2023-07-31 LAB — BRAIN NATRIURETIC PEPTIDE: B Natriuretic Peptide: 66.7 pg/mL (ref 0.0–100.0)

## 2023-07-31 LAB — BASIC METABOLIC PANEL WITH GFR
Anion gap: 10 (ref 5–15)
BUN: 15 mg/dL (ref 6–20)
CO2: 23 mmol/L (ref 22–32)
Calcium: 8.6 mg/dL — ABNORMAL LOW (ref 8.9–10.3)
Chloride: 107 mmol/L (ref 98–111)
Creatinine, Ser: 0.81 mg/dL (ref 0.44–1.00)
GFR, Estimated: >60 mL/min (ref >60)
Glucose, Bld: 173 mg/dL — ABNORMAL HIGH (ref 70–99)
Potassium: 3.4 mmol/L — ABNORMAL LOW (ref 3.5–5.1)
Sodium: 140 mmol/L (ref 135–145)

## 2023-07-31 MED ORDER — TORSEMIDE 20 MG PO TABS: ORAL_TABLET | ORAL | 3 refills | Status: DC

## 2023-07-31 MED ORDER — POTASSIUM CHLORIDE CRYS ER 10 MEQ PO TBCR: 40.0000 meq | EXTENDED_RELEASE_TABLET | Freq: Every day | ORAL | 6 refills | Status: DC

## 2023-07-31 NOTE — Patient Instructions (Addendum)
 Your provider has order Furoscix for you. This is an on-body infuser that gives you a dose of Furosemide.   It will be shipped to your home   [employer]uro[employer] will call you to discuss before shipping so, PLEASE answer unknown calls  For questions regarding the device call [employer]uro[employer] at [phone]  Ensure you write down the time you start your infusion so that if there is a problem you will know how long the infusion lasted  Use Furoscix only AS DIRECTED by our office  Dosing Directions:   Day 1= 08/01/23 use 1 kit with 2 potassium tablets  Day 2= 08/02/23 use 1 kit with 2 potassium tablets  Day 3= 08/03/23 use 1 kit with 2 potassium tablets   HOLD TORSEMIDE WHILE USING THE FUROSCIX.  08/04/23 restart your Torsemide with 1 potassium tablet daily.  Labs done today, your results will be available in MyChart, we will contact you for abnormal readings.  Your physician recommends that you schedule a follow-up appointment in: 2 weeks.  If you have any questions or concerns before your next appointment please send [place] a message through [place] or call our office at [phone].    TO LEAVE A MESSAGE FOR THE NURSE SELECT OPTION 2, PLEASE LEAVE A MESSAGE INCLUDING: YOUR NAME DATE OF BIRTH CALL BACK NUMBER REASON FOR CALL**this is important as we prioritize the call backs  YOU WILL RECEIVE A CALL BACK THE SAME DAY AS LONG AS YOU CALL BEFORE 4:00 PM  At the [facility], you and your health needs are our priority. As part of our continuing mission to provide you with exceptional heart care, we have created designated Provider Care Teams. These Care Teams include your primary Cardiologist (physician) and Advanced Practice Providers (APPs- Physician Assistants and Nurse Practitioners) who all work together to provide you with the care you need, when you need it.   You may see any of the following providers on your designated Care Team at your next follow up: Dr  Arvilla Meres Dr Marca Ancona Dr. Dorthula Nettles Dr. Clearnce Hasten Amy Filbert Schilder, NP Robbie Lis, [place] [facility] [place], [place] Brynda Peon, NP [place] Lee, NP Clarisa Kindred, NP Karle Plumber, PharmD Enos Fling, PharmD   Please be sure to bring in all your medications bottles to every appointment.    Thank you for choosing [facility]

## 2023-07-31 NOTE — Telephone Encounter (Addendum)
 Pt aware, agreeable, and verbalized understanding  Rx updated   ----- Message from Jacklynn Ganong sent at 07/31/2023  3:49 PM EDT ----- K is low. After 3 days of Furoscix, keep KCL to 40 daily. Will repeat labs at follow up

## 2023-07-31 NOTE — Progress Notes (Signed)
 Medication Samples have been provided to the patient.  Drug name: Furoscix       Strength: 80 mg        Qty: 2  LOT: [id number]  Exp.Date: 08/29/2024  Dosing instructions: Take as directed by heart failure clinic  The patient has been instructed regarding the correct time, dose, and frequency of taking this medication, including desired effects and most common side effects.   Smitty Cords Yasemin Rabon 2:47 PM 07/31/2023 Furoscix

## 2023-07-31 NOTE — Progress Notes (Addendum)
 ADVANCED HF CLINIC NOTE  Primary Care: [facility] Primary Cardiologist: Jodelle Red, MD HF Cardiology: Dr. Shirlee Latch  HPI: 43 y.o. former smoker with h/o obesity, OSA on CPAP, HTN, Type 2 DM and lupus anticoagulant (in pregnancy), and diastolic heart failure.    Admitted 10/24 w/ complaints of 1-2 wk history of progressive exertional dyspnea and peripheral edema. BNP 296. CXR w/ cardiomegaly and pulmonary edema. Hs-troponin negative x 2. EKG NSR 94 bpm w/ nonspecific TW abnormalities. Echo was a limited study due to poor acoustic windows, however LVEF noted to be normal at 60-65%, RV also probably normal size/function, trivial MR, unable to estimate PA systolic pressure. She was diuresed 16 lb w/ IV lasix, down from admit wt of 349>>333lb. She was transitioned to PO lasix 40 mg bid + spironolactone 25mg , farxiga 10 mg, and lisinopril 20mg . Referred to [facility] clinic at discharge.  RHC 05/05/23 with R > L filling pressures and mild pHTN, persevered CO but low PAPi. Unable to successfully place cardiomems d/t difficult access. Torsemide increased.    Admitted 3/25 with a/c HF. Diuresed with IV lasix and discharged home, weight 328 lbs.  Today she returns for post hospital HF follow up. Overall feeling more SOB; she is SOB walking short distances on flat ground. Feels she has more fluid around abdomen and dizzy. Feels like she did not get all fluid off during recent hospitalization. Feels occasional palpitations. Denies abnormal bleeding, CP, or PND/Orthopnea. Appetite ok. No fever or chills. Weight at home 335 pounds. Taking all medications. Wears CPAP. No ETOH, tobacco or drug use. Not urinating as briskly on torsemide 80/60.  ECG (personally reviewed): NSR 100 bpm  Labs (11/24): K 4, creatinine 0.69, ANA negative, RF negative, SCL-70 negative Labs (2/25): LDL 78 Labs (3/25): K 3.4, creatinine 0.84    PMH: 1. OSA: uses CPAP 2. Morbid obesity: Unable to tolerate  Ozempic 3. Lupus anticoagulant noted in pregnancy.  4. HTN 5. HFpEF: Echo (10/24) with technically difficult windows, EF 60-65%, RV probably normal, unable to esimate PA systolic pressure.  - RHC 1/25: RA 13, PA 37/21 (30), CO/CI (Fick) 5.76/2.32, PAPi 1.2, PVR 2.25 WU-unable to place Cardiomems PCWP mean 17  6. Type 2 diabetes  Review of Systems: All systems reviewed and negative except as per HPI.   FH: Mother with CHF, she does not know the details.   SH: Lives in [place], married, 2 children, prior smoker but quit 2017, no ETOH. Out of work.   Current Outpatient Medications  Medication Sig Dispense Refill   ALPRAZolam (XANAX) 0.5 MG tablet Take 1 tablet (0.5 mg total) by mouth daily as needed for anxiety. 10 tablet 0   dapagliflozin propanediol (FARXIGA) 10 MG TABS tablet Take 1 tablet (10 mg total) by mouth daily. 30 tablet 3   insulin glargine (LANTUS) 100 UNIT/ML injection Inject 85 Units into the skin at bedtime.     lisinopril (ZESTRIL) 20 MG tablet Take 20 mg by mouth daily.     melatonin 5 MG TABS Take 5 mg by mouth at bedtime as needed (insomnia).     NOVOLOG FLEXPEN 100 UNIT/ML FlexPen Inject 20-50 Units into the skin 3 (three) times daily before meals. Per sliding scale     ondansetron (ZOFRAN) 8 MG tablet TAKE 1 TABLET (8 MG TOTAL) BY MOUTH 3 (THREE) TIMES DAILY AS NEEDED FOR NAUSEA OR VOMITING. 12 tablet 19   potassium chloride (KLOR-CON M) 10 MEQ tablet Take 2 tablets (20 mEq total) by mouth  daily. 60 tablet 6   rizatriptan (MAXALT-MLT) 10 MG disintegrating tablet Take 10 mg by mouth as needed for migraine.     rosuvastatin (CRESTOR) 40 MG tablet Take 40 mg by mouth daily.     spironolactone (ALDACTONE) 25 MG tablet Take 1 tablet (25 mg total) by mouth daily. 30 tablet 0   torsemide (DEMADEX) 20 MG tablet Take 80 mg by mouth daily.     levocetirizine (XYZAL) 5 MG tablet Take 5 mg by mouth daily as needed for allergies. (Patient not taking: Reported on 07/31/2023)     No  current facility-administered medications for this encounter.    Allergies  Allergen Reactions   Augmentin [Amoxicillin-Pot Clavulanate] Anaphylaxis   Amoxicillin Nausea And Vomiting    Has patient had a PCN reaction causing immediate rash, facial/tongue/throat swelling, SOB or lightheadedness with hypotension: No Has patient had a PCN reaction causing severe rash involving mucus membranes or skin necrosis: No Has patient had a PCN reaction that required hospitalization: No Has patient had a PCN reaction occurring within the last 10 years: Yes If all of the above answers are "NO", then may proceed with Cephalosporin use.    Metformin And Related Nausea And Vomiting   Ozempic (0.25 Or 0.5 Mg-Dose) [Semaglutide(0.25 Or 0.5mg -Dos)] Nausea And Vomiting   Trulicity [Dulaglutide] Nausea And Vomiting   Wt Readings from Last 3 Encounters:  07/31/23 (!) 158.8 kg (350 lb)  07/10/23 (!) 149.2 kg (328 lb 14.8 oz)  05/11/23 (!) 152.7 kg (336 lb 9.6 oz)   BP 110/78   Pulse 96   Ht 5\' 5"  (1.651 m)   Wt (!) 158.8 kg (350 lb)   LMP 08/31/2015   SpO2 98%   BMI 58.24 kg/m   PHYSICAL EXAM: General:  NAD. No resp difficulty, walked into clinic HEENT: Normal Neck: Supple. Thick neck Cor: Regular rate & rhythm. No rubs, gallops or murmurs. Lungs: Clear, diminished in bases Abdomen: Soft, obese, nontender, nondistended.  Extremities: No cyanosis, clubbing, rash, 1+ BLE edema Neuro: Alert & oriented x 3, moves all 4 extremities w/o difficulty. Affect pleasant.  ASSESSMENT & PLAN: 1. HFpEF: Echo 10/24 limited study due to poor acoustic windows, however LVEF noted to be normal 60-65%, RV also reported as normal, trivial MR, unable to estimate PA systolic pressure. She was admitted in 10/24 with CHF exacerbation and diuresed. Unable to place Cardiomems d/t difficult access. RHC 1/25 with elevated filling pressures R>L with preserved CO and low PAPi. NYHA class III symptoms, functional status limited by  body habitus. Volume difficult due to body habitus, but she appears volume overloaded on exam and weight is up 22 lbs. - Give Furoscix + 40 KCL daily x 3 days (hold torsemide while using Furoscix) - After 3 days, resume home dose torsemide 80/60 + 20 KCL daily. BMET/BNP today, repeat BMET at close follow up. - Continue Farxiga 10 mg daily. No GU symptoms. - Continue spironolactone 25 mg daily - Continue lisinopril 20 mg daily. - Unable to place Cardiomems d/t difficulty of vascular acces.  2. 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 - Needs weight loss. 3. OSA: Continue CPAP. No change. 4. HTN: BP controlled. No change to BP-active meds today. 5. Obesity: Body mass index is 58.24 kg/m. - Failed Ozempic with nausea/vomiting.  - She is followed by Endocrinology. 6. Diabetes Type II: She is on insulin +  SGLT2i. - A1C (2/25) 8.7  Follow up in 2 weeks with APP (will need BMET, Mag and ECG).  Prince Rome, FNP-BC 07/31/23  FUROSCIX prescribed  Patient viewed patient education video with QR code for Lenor Coffin code for FUROSCIX placed on AVS  Call FUROSCIX Direct at [phone] for questions regarding on body infuser.  Day 1 08/01/23 FUROSCIX 80 mg once daily  via on body infuser + KDUR 40   Day 2 08/02/23 FUROSCIX 80 mg once daily  via on body infuser+ KDUR 40   Day 3 08/03/23 FUROSCIX  80 mg once daily  via on body infuser+ KDUR 40    Hold torsemide while using Furoscix

## 2023-07-31 NOTE — Progress Notes (Signed)
 Provided patient education on Furoscix using demo kits and Furoscix video, QR code provided on AVS for further viewing.

## 2023-07-31 NOTE — Addendum Note (Signed)
 Encounter addended by: Suezanne Cheshire, RN on: 07/31/2023 2:50 PM  Actions taken: Clinical Note Signed

## 2023-08-15 ENCOUNTER — Telehealth (HOSPITAL_COMMUNITY): Payer: Self-pay | Admitting: *Deleted

## 2023-08-15 NOTE — Telephone Encounter (Signed)
 Called to confirm/remind patient of their appointment at the [facility] on 08/04/23***.   Appointment:   [x] Confirmed  [] Left mess   [] No answer/No voice mail  [] Phone not in service  Patient reminded to bring all medications and/or complete list.  Confirmed patient has transportation. Gave directions, instructed to utilize valet parking.

## 2023-08-16 ENCOUNTER — Ambulatory Visit (HOSPITAL_COMMUNITY)
Admission: RE | Admit: 2023-08-16 | Discharge: 2023-08-16 | Disposition: A | Discharge status: Home / Self Care | Source: Ambulatory Visit | Attending: Cardiology | Admitting: Cardiology

## 2023-08-16 VITALS — BP 110/60 | HR 104 | Wt 346.0 lb

## 2023-08-16 DIAGNOSIS — E119 Type 2 diabetes mellitus without complications: Secondary | ICD-10-CM | POA: Insufficient documentation

## 2023-08-16 DIAGNOSIS — N63 Unspecified lump in unspecified breast: Secondary | ICD-10-CM | POA: Insufficient documentation

## 2023-08-16 DIAGNOSIS — I5032 Chronic diastolic (congestive) heart failure: Secondary | ICD-10-CM | POA: Diagnosis present

## 2023-08-16 DIAGNOSIS — Z794 Long term (current) use of insulin: Secondary | ICD-10-CM | POA: Diagnosis not present

## 2023-08-16 DIAGNOSIS — I272 Pulmonary hypertension, unspecified: Secondary | ICD-10-CM | POA: Diagnosis not present

## 2023-08-16 DIAGNOSIS — I110 Hypertensive heart disease with heart failure: Secondary | ICD-10-CM | POA: Diagnosis not present

## 2023-08-16 DIAGNOSIS — Z79899 Other long term (current) drug therapy: Secondary | ICD-10-CM | POA: Insufficient documentation

## 2023-08-16 DIAGNOSIS — E662 Morbid (severe) obesity with alveolar hypoventilation: Secondary | ICD-10-CM | POA: Diagnosis not present

## 2023-08-16 DIAGNOSIS — Z7984 Long term (current) use of oral hypoglycemic drugs: Secondary | ICD-10-CM | POA: Insufficient documentation

## 2023-08-16 DIAGNOSIS — Z6841 Body Mass Index (BMI) 40.0 and over, adult: Secondary | ICD-10-CM | POA: Insufficient documentation

## 2023-08-16 DIAGNOSIS — Z87891 Personal history of nicotine dependence: Secondary | ICD-10-CM | POA: Insufficient documentation

## 2023-08-16 LAB — BASIC METABOLIC PANEL WITH GFR
Anion gap: 13 (ref 5–15)
BUN: 14 mg/dL (ref 6–20)
CO2: 28 mmol/L (ref 22–32)
Calcium: 9.4 mg/dL (ref 8.9–10.3)
Chloride: 97 mmol/L — ABNORMAL LOW (ref 98–111)
Creatinine, Ser: 1.11 mg/dL — ABNORMAL HIGH (ref 0.44–1.00)
GFR, Estimated: >60 mL/min (ref >60)
Glucose, Bld: 246 mg/dL — ABNORMAL HIGH (ref 70–99)
Potassium: 4.3 mmol/L (ref 3.5–5.1)
Sodium: 138 mmol/L (ref 135–145)

## 2023-08-16 LAB — BRAIN NATRIURETIC PEPTIDE: B Natriuretic Peptide: 25.6 pg/mL (ref 0.0–100.0)

## 2023-08-16 NOTE — Patient Instructions (Addendum)
 Good to see you today!  No medications changes   Labs done today, your results will be available in MyChart, we will contact you for abnormal readings.  Your physician recommends that you schedule a follow-up appointment :3 months(July) call office in May to schedule an appointment  If you have any questions or concerns before your next appointment please send us  a message through [place] or call our office at [phone].    TO LEAVE A MESSAGE FOR THE NURSE SELECT OPTION 2, PLEASE LEAVE A MESSAGE INCLUDING: YOUR NAME DATE OF BIRTH CALL BACK NUMBER REASON FOR CALL**this is important as we prioritize the call backs  YOU WILL RECEIVE A CALL BACK THE SAME DAY AS LONG AS YOU CALL BEFORE 4:00 PM At the [facility], you and your health needs are our priority. As part of our continuing mission to provide you with exceptional heart care, we have created designated Provider Care Teams. These Care Teams include your primary Cardiologist (physician) and Advanced Practice Providers (APPs- Physician Assistants and Nurse Practitioners) who all work together to provide you with the care you need, when you need it.   You may see any of the following providers on your designated Care Team at your next follow up: Dr Jules Oar Dr Peder Bourdon Dr. Alwin Baars Dr. Arta Lark Amy Marijane Shoulders, NP Ruddy Corral, [place] [facility] [place], [place] Dennise Fitz, NP [place] Lee, NP Shawnee Dellen, NP Luster Salters, PharmD Bevely Brush, PharmD   Please be sure to bring in all your medications bottles to every appointment.    Thank you for choosing [facility]

## 2023-08-16 NOTE — Progress Notes (Signed)
 ADVANCED HF CLINIC NOTE  Primary Care: Practice[facility] Primary Cardiologist: Jodelle Red, MD HF Cardiology: Dr. Shirlee Latch  Chief Complaint: f/u for HFpEF and PH HPI: 43 y.o. former smoker with h/o obesity, OSA on CPAP, HTN, Type 2 DM and lupus anticoagulant (in pregnancy), and diastolic heart failure.   Echo 10/24: EF 60-65%, RV normal, trivial MR. Diuresed 16 lb w/ IV lasix, weight 349>333 lb. Started ob GDMT. Referred to [facility] clinic at discharge and then referred to [facility] for RHC to better assess volume status and for [facility] w/u and consideration for CardioMEMs implant.   RHC 05/05/23 with R > L filling pressures and mild pHTN, persevered CO but low PAPi. Unable to successfully place cardiomems d/t difficult access. Torsemide increased.    She presents today for f/u for HFpEF and PH.  Continues w/ NYHA Class IIIb symptoms, confounded by obesity and deconditioning. Wt is up 10 lb since January but unclear if fluid vs caloric. Exam difficult due to body habitus. She reports full compliance w/ torsemide but admits to dietary indiscretion recently. Has been stressed. Just found to have breast lump and needing to undergo mammogram/biopsy.   Losing wt has been difficult. Recently tried [facility] but had to stop due to severe nausea and vomiting. Had same side effects w/ Ozempic. She just enrolled in [employer] Program.      Review of Systems: All systems reviewed and negative except as per HPI.   FH: Mother with CHF, she does not know the details.   SH: Lives in [place], married, 2 children, prior smoker but quit 2017, no ETOH. Out of work.   Current Outpatient Medications  Medication Sig Dispense Refill   ALPRAZolam (XANAX) 0.5 MG tablet Take 1 tablet (0.5 mg total) by mouth daily as needed for anxiety. 10 tablet 0   dapagliflozin propanediol (FARXIGA) 10 MG TABS tablet Take 1 tablet (10 mg total) by mouth daily. 30 tablet 3   insulin glargine (LANTUS) 100 UNIT/ML  injection Inject 85 Units into the skin at bedtime.     levocetirizine (XYZAL) 5 MG tablet Take 5 mg by mouth daily as needed for allergies.     lisinopril (ZESTRIL) 20 MG tablet Take 20 mg by mouth daily.     melatonin 5 MG TABS Take 5 mg by mouth at bedtime as needed (insomnia).     NOVOLOG FLEXPEN 100 UNIT/ML FlexPen Inject 20-50 Units into the skin 3 (three) times daily before meals. Per sliding scale     ondansetron (ZOFRAN) 8 MG tablet TAKE 1 TABLET (8 MG TOTAL) BY MOUTH 3 (THREE) TIMES DAILY AS NEEDED FOR NAUSEA OR VOMITING. 12 tablet 19   potassium chloride (KLOR-CON M) 10 MEQ tablet Take 4 tablets (40 mEq total) by mouth daily. 200 tablet 6   rizatriptan (MAXALT-MLT) 10 MG disintegrating tablet Take 10 mg by mouth as needed for migraine.     rosuvastatin (CRESTOR) 40 MG tablet Take 40 mg by mouth daily.     spironolactone (ALDACTONE) 25 MG tablet Take 1 tablet (25 mg total) by mouth daily. 30 tablet 0   torsemide (DEMADEX) 20 MG tablet Take 4 tablets (80 mg total) by mouth every morning AND 3 tablets (60 mg total) every evening. 200 tablet 3   No current facility-administered medications for this encounter.    Allergies  Allergen Reactions   Augmentin [Amoxicillin-Pot Clavulanate] Anaphylaxis   Amoxicillin Nausea And Vomiting    Has patient had a PCN reaction causing immediate rash, facial/tongue/throat  swelling, SOB or lightheadedness with hypotension: No Has patient had a PCN reaction causing severe rash involving mucus membranes or skin necrosis: No Has patient had a PCN reaction that required hospitalization: No Has patient had a PCN reaction occurring within the last 10 years: Yes If all of the above answers are "NO", then may proceed with Cephalosporin use.    Metformin And Related Nausea And Vomiting   Ozempic (0.25 Or 0.5 Mg-Dose) [Semaglutide(0.25 Or 0.5mg -Dos)] Nausea And Vomiting   Trulicity [Dulaglutide] Nausea And Vomiting    Vitals:   08/16/23 1425  BP: 110/60   Pulse: (!) 104  SpO2: 97%  Weight: (!) 156.9 kg (346 lb)    Filed Weights   08/16/23 1425  Weight: (!) 156.9 kg (346 lb)     Wt Readings from Last 3 Encounters:  08/16/23 (!) 156.9 kg (346 lb)  07/31/23 (!) 158.8 kg (350 lb)  07/10/23 (!) 149.2 kg (328 lb 14.8 oz)     PHYSICAL EXAM: General:  super morbidly obese. No respiratory difficulty HEENT: normal Neck: supple. Thick neck, unable to assess for JVD. Carotids 2+ bilat; no bruits. No lymphadenopathy or thyromegaly appreciated. Cor: PMI nondisplaced. Regular rate & rhythm. No rubs, gallops or murmurs. Lungs: clear Abdomen: obese, nontender, nondistended. No hepatosplenomegaly. No bruits or masses. Good bowel sounds. Extremities: no cyanosis, clubbing, rash, no peripheral edema Neuro: alert & oriented x 3, cranial nerves grossly intact. moves all 4 extremities w/o difficulty. Affect pleasant.   ASSESSMENT & PLAN:  1. HFpEF: Echo 10/24 60-65%, RV normal, trivial MR. Attempted cardiomems placement but failed d/t difficult access. RHC 1/25 with elevated filling pressures R>L with preserved CO and low PAPi.  - Volume assessment difficult due to body habitus. She has had 10 lb wt gain in the last 3 months but unclear if this is fluid retention vs caloric. Will check BNP, if elevated will need to increase torsemide - for now, continue torsemide 60 mg bid  - Continue Farxiga 10 mg daily. No GU symptoms. - Continue spironolactone 25 mg daily - Continue Lisinopril 20 mg daily - Unable to place Cardiomems d/t difficulty of vascular acces.   2. Mild Pulmonary Hypertension:  - RHC PA mean 30 with PCWP 30 and PVR 2.25 - PFTs 2/25 demonstrated minimal airway obstruction, suggesting small airway disease  - primarily WHO group 3 with obesity hypoventilation and OSA + component of group 2  - diuretics per above - continue CPAP - needs wt loss, failed GLP1. Now enrolled in [facility] program. Trying to walk at home   3. OSA: on CPAP. Reports  full compliance.  4. HTN: controlled on current regimen - no change, see GDMT per above    5. Obesity: Body mass index is 57.58 kg/m.  Failed Ozempic and Mounjaro with nausea/vomiting.  - she is trying to watch diet and increase activity by walking more - we discussed wt loss surgery/ referral to bariatric specialist. She will think about it   6. Diabetes Type II - Poorly controlled, recent A1C 8.7.  - on Insulin + Farxiga  - per PCP   Follow up: w/ Dr. Mitzie Anda in 3 months   Ruddy Corral, PA-C  08/16/2023

## 2023-09-04 ENCOUNTER — Encounter (HOSPITAL_BASED_OUTPATIENT_CLINIC_OR_DEPARTMENT_OTHER): Payer: Self-pay

## 2023-09-05 MED ORDER — POTASSIUM CHLORIDE CRYS ER 10 MEQ PO TBCR: 40.0000 meq | EXTENDED_RELEASE_TABLET | Freq: Every day | ORAL | 11 refills | Status: DC

## 2023-10-19 ENCOUNTER — Telehealth: Payer: Self-pay

## 2023-10-19 ENCOUNTER — Telehealth: Payer: Self-pay | Admitting: *Deleted

## 2023-10-19 NOTE — Telephone Encounter (Signed)
 Patient has been scheduled for tele preop appt

## 2023-10-19 NOTE — Telephone Encounter (Signed)
 Patient has been scheduled for tele preop appt med rec and consent done     Patient Consent for Virtual Visit         Kathleen Walsh has provided verbal consent on 10/19/2023 for a virtual visit (video or telephone).   CONSENT FOR VIRTUAL VISIT FOR:  Kathleen Walsh  By participating in this virtual visit I agree to the following:  I hereby voluntarily request, consent and authorize [facility] and its employed or contracted physicians, physician assistants, nurse practitioners or other licensed health care professionals (the Practitioner), to provide me with telemedicine health care services (the "Services) as deemed necessary by the treating Practitioner. I acknowledge and consent to receive the Services by the Practitioner via telemedicine. I understand that the telemedicine visit will involve communicating with the Practitioner through live audiovisual communication technology and the disclosure of certain medical information by electronic transmission. I acknowledge that I have been given the opportunity to request an in-person assessment or other available alternative prior to the telemedicine visit and am voluntarily participating in the telemedicine visit.  I understand that I have the right to withhold or withdraw my consent to the use of telemedicine in the course of my care at any time, without affecting my right to future care or treatment, and that the Practitioner or I may terminate the telemedicine visit at any time. I understand that I have the right to inspect all information obtained and/or recorded in the course of the telemedicine visit and may receive copies of available information for a reasonable fee.  I understand that some of the potential risks of receiving the Services via telemedicine include:  Delay or interruption in medical evaluation due to technological equipment failure or disruption; Information transmitted may not be sufficient (e.g. poor resolution of  images) to allow for appropriate medical decision making by the Practitioner; and/or  In rare instances, security protocols could fail, causing a breach of personal health information.  Furthermore, I acknowledge that it is my responsibility to provide information about my medical history, conditions and care that is complete and accurate to the best of my ability. I acknowledge that Practitioner's advice, recommendations, and/or decision may be based on factors not within their control, such as incomplete or inaccurate data provided by me or distortions of diagnostic images or specimens that may result from electronic transmissions. I understand that the practice of medicine is not an exact science and that Practitioner makes no warranties or guarantees regarding treatment outcomes. I acknowledge that a copy of this consent can be made available to me via my patient portal [facility] MyChart), or I can request a printed copy by calling the office of [facility].    I understand that my insurance will be billed for this visit.   I have read or had this consent read to me. I understand the contents of this consent, which adequately explains the benefits and risks of the Services being provided via telemedicine.  I have been provided ample opportunity to ask questions regarding this consent and the Services and have had my questions answered to my satisfaction. I give my informed consent for the services to be provided through the use of telemedicine in my medical care

## 2023-10-19 NOTE — Telephone Encounter (Signed)
   Pre-operative Risk Assessment    Patient Name: Kathleen Walsh  DOB: November 11, 1980 MRN: [id number]   Date of last office visit: 08/16/23 [facility], [facility] Date of next office visit: 11/20/23 DR. MCLEAN-3 MONTH F/U   Request for Surgical Clearance    Procedure:  RIGHT FIRST DORSAL COMPARTMENT  RELEASE, RIGHT  WRIST ARTHROSCOPY WITH DEBRIDEMENT AND SYNOVECTOMY AS INDICATED, POSSIBLE TFCC REPAIR   Date of Surgery:  Clearance TBD                                Surgeon:  DR. Osborne Blazer Surgeon's Group or Practice Name:  [facility] Phone number:  [phone] Fax number:  [phone] ATTN: Valinda Gault WILLS   Type of Clearance Requested:   - Medical ; NONE INDICATED TO BE HELD   Type of Anesthesia:  MAC & REGIONAL   Additional requests/questions:    Princeton Broom   10/19/2023, 10:20 AM

## 2023-10-19 NOTE — Telephone Encounter (Signed)
   Name: Kathleen Walsh  DOB: 26-Jul-1980  MRN: [id number]  Primary Cardiologist: Sheryle Donning, MD   Preoperative team, please contact this patient and set up a phone call appointment for further preoperative risk assessment. Please obtain consent and complete medication review. Thank you for your help.  I confirm that guidance regarding antiplatelet and oral anticoagulation therapy has been completed and, if necessary, noted below.  None requested  I also confirmed the patient resides in the state of [place] . As per [facility] [place] [employer] telemedicine laws, the patient must reside in the state in which the provider is licensed.   Carie Charity, NP 10/19/2023, 1:04 PM [facility]

## 2023-10-25 ENCOUNTER — Ambulatory Visit: Attending: Cardiology

## 2023-10-25 DIAGNOSIS — Z01810 Encounter for preprocedural cardiovascular examination: Secondary | ICD-10-CM | POA: Diagnosis not present

## 2023-10-25 NOTE — Progress Notes (Signed)
 Virtual Visit via Telephone Note   Because of FLORENA Walsh co-morbid illnesses, she is at least at moderate risk for complications without adequate follow up.  This format is felt to be most appropriate for this patient at this time.  Due to technical limitations with video connection Web designer), today's appointment will be conducted as an audio only telehealth visit, and SHANTELE RELLER verbally agreed to proceed in this manner.   All issues noted in this document were discussed and addressed.  No physical exam could be performed with this format.  Evaluation Performed:  Preoperative cardiovascular risk assessment _____________   Date:  10/25/2023   Patient ID:  Kathleen Walsh, DOB 06-15-1980, MRN [id number] Patient Location:  Home Provider location:   Office  Primary Care Provider:  Practice, [facility] Primary Cardiologist:  Shelda Bruckner, MD  Chief Complaint / Patient Profile  43 y.o. y/o female with a h/o HFpEF, mild pulmonary hypertension, OSA, hypertension, type 2 diabetes who is pending right first dorsal compartment release, right wrist arthroscopy with debridement and synovectomy as indicated, possible TFCC repair and presents today for telephonic preoperative cardiovascular risk assessment. History of Present Illness  Kathleen Walsh is a 43 y.o. female who presents via audio/video conferencing for a telehealth visit today.  Pt was last seen in cardiology clinic on 08/16/2023 by Caffie Shed, NP.  At that time SADAKO CEGIELSKI was doing well.  The patient is now pending procedure as outlined above. Since her last visit, she has remained stable from a cardiac standpoint. She reports her weight has been stable. Today she denies chest pain, shortness of breath, increased lower extremity edema, fatigue, palpitations, melena, hematuria, hemoptysis, diaphoresis, weakness, presyncope, syncope, orthopnea, and PND.  Past Medical History    Past Medical History:   Diagnosis Date   Abdominal wall pain    Anemia    Anxiety    Blood dyscrasia    lupus anticoagulant during pregnancy   Complication of anesthesia    pt states became  panicky after left shoulder surgery 10/2019   Depression    takes cymbalta    Diabetes mellitus without complication (HCC)    Headache(784.0)    migraines   Hx of lupus anticoagulant disorder    Hyperlipidemia    Hypertension    Obesity    PONV (postoperative nausea and vomiting)    Sleep apnea    USES C-PAP   Past Surgical History:  Procedure Laterality Date   ABDOMINAL HYSTERECTOMY  2016   ANKLE ARTHROSCOPY WITH REPAIR SUBLUXING TENDON Left    BIOPSY  11/22/2021   Procedure: BIOPSY;  Surgeon: Federico Rosario BROCKS, MD;  Location: [facility];  Service: Gastroenterology;;   CESAREAN SECTION WITH BILATERAL TUBAL LIGATION Bilateral 02/11/2013   Procedure: Repeat CESAREAN SECTION WITH BILATERAL TUBAL LIGATION;  Surgeon: Charlie JINNY Flowers, MD;  Location: [facility];  Service: Obstetrics;  Laterality: Bilateral;  EDD: 03/01/13   COLONOSCOPY WITH PROPOFOL  N/A 11/22/2021   Procedure: COLONOSCOPY WITH PROPOFOL ;  Surgeon: Federico Rosario BROCKS, MD;  Location: [facility];  Service: Gastroenterology;  Laterality: N/A;   DEBRIDEMENT OF ABDOMINAL WALL ABSCESS N/A 12/03/2014   Procedure: INCISION OF ABDOMINAL WALL LIPOMA;  Surgeon: Camellia Blush, MD;  Location: [facility];  Service: General;  Laterality: N/A;   DILATION AND CURETTAGE OF UTERUS  2004   DILITATION & CURRETTAGE/HYSTROSCOPY WITH NOVASURE ABLATION N/A 11/19/2014   Procedure: DILATATION & CURETTAGE/HYSTEROSCOPY WITH NOVASURE ABLATION;  Surgeon: Charlie Flowers, MD;  Location: [facility];  Service: Gynecology;  Laterality: N/A;   ENDOMETRIAL ABLATION  10/20/2014   ESOPHAGOGASTRODUODENOSCOPY (EGD) WITH PROPOFOL  N/A 11/22/2021   Procedure: ESOPHAGOGASTRODUODENOSCOPY (EGD) WITH PROPOFOL ;  Surgeon: Federico Rosario BROCKS, MD;  Location: [facility];  Service: Gastroenterology;  Laterality: N/A;   FOOT  SURGERY  2008   HERNIA REPAIR  09/19/2011   INCISION AND DRAINAGE ABSCESS N/A 12/17/2022   Procedure: INCISION AND DRAINAGE PERIMANDIBULAR ABSCESS;  Surgeon: Carlie Clark, MD;  Location: [facility];  Service: ENT;  Laterality: N/A;   LAPAROSCOPY N/A 12/03/2014   Procedure: LAPAROSCOPY DIAGNOSTIC WITH LYSIS OF ADHESIONS;  Surgeon: Camellia Blush, MD;  Location: [facility];  Service: General;  Laterality: N/A;   left shoulder surgery      NASAL SEPTUM SURGERY     POLYPECTOMY  11/22/2021   Procedure: POLYPECTOMY;  Surgeon: Federico Rosario BROCKS, MD;  Location: [facility];  Service: Gastroenterology;;  EGD and COLON   RIGHT HEART CATH N/A 05/05/2023   Procedure: RIGHT HEART CATH;  Surgeon: Rolan Ezra RAMAN, MD;  Location[facility] INVASIVE CV LAB;  Service: Cardiovascular;  Laterality: N/A;   TUBAL LIGATION     VENTRAL HERNIA REPAIR  09/20/2010   Laparoscopic, Dr Camellia Blush   WISDOM TOOTH EXTRACTION     Allergies Allergies  Allergen Reactions   Augmentin  [Amoxicillin -Pot Clavulanate] Anaphylaxis   Amoxicillin  Nausea And Vomiting    Has patient had a PCN reaction causing immediate rash, facial/tongue/throat swelling, SOB or lightheadedness with hypotension: No Has patient had a PCN reaction causing severe rash involving mucus membranes or skin necrosis: No Has patient had a PCN reaction that required hospitalization: No Has patient had a PCN reaction occurring within the last 10 years: Yes If all of the above answers are NO, then may proceed with Cephalosporin use.    Metformin  And Related Nausea And Vomiting   Ozempic (0.25 Or 0.5 Mg-Dose) [Semaglutide(0.25 Or 0.5mg -Dos)] Nausea And Vomiting   Trulicity [Dulaglutide] Nausea And Vomiting   Home Medications    Prior to Admission medications   Medication Sig Start Date End Date Taking? Authorizing Provider  ALPRAZolam  (XANAX ) 0.5 MG tablet Take 1 tablet (0.5 mg total) by mouth daily as needed for anxiety. 07/10/23   Arrien, Mauricio Daniel, MD  dapagliflozin   propanediol (FARXIGA ) 10 MG TABS tablet Take 1 tablet (10 mg total) by mouth daily. 03/22/23   Marcine Caffie HERO, PA-C  insulin  glargine (LANTUS ) 100 UNIT/ML injection Inject 85 Units into the skin at bedtime.    [provider]  levocetirizine (XYZAL) 5 MG tablet Take 5 mg by mouth daily as needed for allergies.    [provider]  lisinopril  (ZESTRIL ) 20 MG tablet Take 20 mg by mouth daily.    [provider]  melatonin 5 MG TABS Take 5 mg by mouth at bedtime as needed (insomnia).    [provider]  NOVOLOG  FLEXPEN 100 UNIT/ML FlexPen Inject 20-50 Units into the skin 3 (three) times daily before meals. Per sliding scale 11/07/22   [provider]  ondansetron  (ZOFRAN ) 8 MG tablet TAKE 1 TABLET (8 MG TOTAL) BY MOUTH 3 (THREE) TIMES DAILY AS NEEDED FOR NAUSEA OR VOMITING. 04/18/23   Federico Rosario BROCKS, MD  potassium chloride  (KLOR-CON  M) 10 MEQ tablet Take 4 tablets (40 mEq total) by mouth daily. 09/05/23   Lonni Slain, MD  rizatriptan  (MAXALT -MLT) 10 MG disintegrating tablet Take 10 mg by mouth as needed for migraine. 11/10/22   [provider]  rosuvastatin  (CRESTOR ) 40 MG tablet Take 40  mg by mouth daily.    [provider]  spironolactone  (ALDACTONE ) 25 MG tablet Take 1 tablet (25 mg total) by mouth daily. 03/08/23   Cindy Garnette POUR, MD  torsemide  (DEMADEX ) 20 MG tablet Take 4 tablets (80 mg total) by mouth every morning AND 3 tablets (60 mg total) every evening. 07/31/23   Glena Harlene HERO, FNP   Physical Exam   Vital Signs:  ANAALICIA REIMANN does not have vital signs available for review today. Given telephonic nature of communication, physical exam is limited. AAOx3. NAD. Normal affect.  Speech and respirations are unlabored. Accessory Clinical Findings   None Assessment & Plan    1.  Preoperative Cardiovascular Risk Assessment: Ms. Gal perioperative risk of a major cardiac event is 6.6% according to the Revised  Cardiac Risk Index (RCRI).  Therefore, she is at high risk for perioperative complications.   Her functional capacity is good at 7.25 METs according to the Duke Activity Status Index (DASI). Recommendations: According to ACC/AHA guidelines, no further cardiovascular testing needed.  The patient may proceed to surgery at acceptable risk.   Antiplatelet and/or Anticoagulation Recommendations: None requested.    The patient was advised that if she develops new symptoms prior to surgery to contact our office to arrange for a follow-up visit, and she verbalized understanding.  A copy of this note will be routed to requesting surgeon.  Time:   Today, I have spent 10 minutes with the patient with telehealth technology discussing medical history, symptoms, and management plan.    Azhar Yogi D Stefanie Hodgens, NP  10/25/2023, 1:29 PM

## 2023-11-06 ENCOUNTER — Encounter (HOSPITAL_COMMUNITY): Payer: Self-pay | Admitting: Orthopedic Surgery

## 2023-11-06 ENCOUNTER — Other Ambulatory Visit: Payer: Self-pay

## 2023-11-06 NOTE — Progress Notes (Signed)
 SDW call  Patient was given pre-op instructions over the phone. Patient verbalized understanding of instructions provided.     PCP - [facility] Cardiologist - Dr. Shelda Bruckner, Cardiac clearance 10/25/2023 Pulmonary:    PPM/ICD - denies Device Orders - na Rep Notified - na   Chest x-ray - 07/05/2023 EKG -  08/16/2023 Stress Test - ECHO - 03/03/2023 Cardiac Cath - 05/05/2023  Sleep Study/sleep apnea/CPAP: OSA with nightly CPAP  Type II diabetic.  CGM, Dexcom 7 to left arm.  A1C 8.2 09/29/2023 Fasting Blood sugar range: 150-160 How often check sugars: Continuous Farxiga , last dose 11/06/2023 Novolog , zero units DOS Lantus , 45 units night before surgery which is 50% of your regular dose   Blood Thinner Instructions: denies Aspirin  Instructions:denies   ERAS Protcol - Clears until 0930   Anesthesia review: Yes. HTN, DM, OSA with CPAP, HLD, lupus anticoag during pregnancy   Patient denies shortness of breath, fever, cough and chest pain over the phone call  Your procedure is scheduled on Wednesday November 08, 2023  Report to [facility] Main Entrance A at  1000  A.M., then check in with the Admitting office.  Call this number if you have problems the morning of surgery:  [phone]   If you have any questions prior to your surgery date call [phone]: Open Monday-Friday 8am-4pm If you experience any cold or flu symptoms such as cough, fever, chills, shortness of breath, etc. between now and your scheduled surgery, please notify us  at the above number    Remember:  Do not eat after midnight the night before your surgery  You may drink clear liquids until 0930  the morning of your surgery.   Clear liquids allowed are: Water , Non-Citrus Juices (without pulp), Carbonated Beverages, Clear Tea, Black Coffee ONLY (NO MILK, CREAM OR POWDERED CREAMER of any kind), and Gatorade   Take these medicines the morning of surgery with A SIP OF WATER :   Rosuvastatin   As needed: Pepcid , xyzal, clear eyes, zofran , maxalt   As of today, STOP taking any Aspirin  (unless otherwise instructed by your surgeon) Aleve , Naproxen , Ibuprofen , Motrin , Advil , Goody's, BC's, all herbal medications, fish oil, and all vitamins.

## 2023-11-07 NOTE — Progress Notes (Signed)
 Anesthesia Chart Review: Same day workup  43 year old female follows with cardiology for history of HFpEF, mild pulmonary hypertension, OSA on CPAP, HTN.  Seen by Katlyn West, NP on 10/25/2023 for preop evaluation.  Per note, Preoperative Cardiovascular Risk Assessment: Ms. Shoff perioperative risk of a major cardiac event is 6.6% according to the Revised Cardiac Risk Index (RCRI).  Therefore, she is at high risk for perioperative complications.   Her functional capacity is good at 7.25 METs according to the Duke Activity Status Index (DASI). Recommendations: According to ACC/AHA guidelines, no further cardiovascular testing needed.  The patient may proceed to surgery at acceptable risk. Have not Antiplatelet and/or Anticoagulation Recommendations: None requested. The patient was advised that if she develops new symptoms prior to surgery to contact our office to arrange for a follow-up visit, and she verbalized understanding.  Per anesthesia notes 12/17/2022, patient had difficult airway due to limited oral opening and edematous airway.  This was in the setting of I&D of perimandibular abscess.  Glidescope was used at that time.  Prior intubation 12/09/2020 with Cleotilde 2 without difficulty.  Other pertinent history includes former smoker, PONV, poorly controlled IDDM 2 (A1c 8.2 on 09/02/2023), GERD on H2 blocker.  Patient will need day of surgery labs and evaluation.  EKG 08/16/23: Sinus tachycardia. Rate 106. Rightward axis. Nonspecific T wave abnormality  RHC 05/05/23: 1. Unsuccessful Cardiomems placement due very difficult groin access.  2. RHC showed elevated R > L heart filling pressures with mild pulmonary venous hypertension, preserved cardiac output but low PAPi.    I will have her increase her torsemide  to 60 mg bid.   TTE 03/03/23: IMPRESSIONS:  1. Poor acoustic windows limit study.   2. Left ventricular ejection fraction, by estimation, is 60 to 65%. The  left ventricle has normal  function. The left ventricle has no regional  wall motion abnormalities.   3. Right ventricular systolic function is normal. The right ventricular  size is normal.   4. Trivial mitral valve regurgitation.   5. The aortic valve was not well visualized. Aortic valve regurgitation  is not visualized. No stenosis.      Lynwood Geofm RIGGERS [facility] Short Stay Center/Anesthesiology Phone [phone] 11/07/2023 9:20 AM

## 2023-11-07 NOTE — Anesthesia Preprocedure Evaluation (Signed)
 Anesthesia Evaluation  Patient identified by MRN, date of birth, ID band Patient awake    Reviewed: Allergy & Precautions, NPO status , Patient's Chart, lab work & pertinent test results  History of Anesthesia Complications (+) PONV and history of anesthetic complications  Airway Mallampati: I  TM Distance: >3 FB Neck ROM: Full    Dental  (+) Dental Advisory Given, Edentulous Upper   Pulmonary sleep apnea , former smoker   breath sounds clear to auscultation       Cardiovascular hypertension, Pt. on medications +CHF   Rhythm:Regular Rate:Normal     Neuro/Psych  Headaches PSYCHIATRIC DISORDERS Anxiety Depression     Neuromuscular disease    GI/Hepatic ,GERD  Medicated,,  Endo/Other  diabetes, Type 2, Insulin  Dependent  Class 4 obesity  Renal/GU      Musculoskeletal negative musculoskeletal ROS (+)    Abdominal  (+) + obese  Peds  Hematology  (+) Blood dyscrasia, anemia   Anesthesia Other Findings   Reproductive/Obstetrics                              Anesthesia Physical Anesthesia Plan  ASA: 3  Anesthesia Plan: General   Post-op Pain Management: Toradol  IV (intra-op)* and Ofirmev  IV (intra-op)*   Induction: Intravenous  PONV Risk Score and Plan: 4 or greater and Ondansetron , Treatment may vary due to age or medical condition, Midazolam , Dexamethasone  and Scopolamine  patch - Pre-op  Airway Management Planned: Oral ETT  Additional Equipment: None  Intra-op Plan:   Post-operative Plan: Extubation in OR  Informed Consent: I have reviewed the patients History and Physical, chart, labs and discussed the procedure including the risks, benefits and alternatives for the proposed anesthesia with the patient or authorized representative who has indicated his/her understanding and acceptance.     Dental advisory given  Plan Discussed with: CRNA  Anesthesia Plan Comments: (Pt  declined nerve block.   PAT note by Lynwood Hope, PA-C: 43 year old female follows with cardiology for history of HFpEF, mild pulmonary hypertension, OSA on CPAP, HTN.  Seen by Katlyn West, NP on 10/25/2023 for preop evaluation.  Per note, Preoperative Cardiovascular Risk Assessment: Ms. Sammons perioperative risk of a major cardiac event is 6.6% according to the Revised Cardiac Risk Index (RCRI).  Therefore, she is at high risk for perioperative complications.   Her functional capacity is good at 7.25 METs according to the Duke Activity Status Index (DASI). Recommendations: According to ACC/AHA guidelines, no further cardiovascular testing needed.  The patient may proceed to surgery at acceptable risk. Have not Antiplatelet and/or Anticoagulation Recommendations: None requested. The patient was advised that if she develops new symptoms prior to surgery to contact our office to arrange for a follow-up visit, and she verbalized understanding.  Per anesthesia notes 12/17/2022, patient had difficult airway due to limited oral opening and edematous airway.  This was in the setting of I&D of perimandibular abscess.  Glidescope was used at that time.  Prior intubation 12/09/2020 with Cleotilde 2 without difficulty.  Other pertinent history includes former smoker, PONV, poorly controlled IDDM 2 (A1c 8.2 on 09/02/2023), GERD on H2 blocker.  Patient will need day of surgery labs and evaluation.  EKG 08/16/23: Sinus tachycardia. Rate 106. Rightward axis. Nonspecific T wave abnormality  RHC 05/05/23: 1. Unsuccessful Cardiomems placement due very difficult groin access.  2. RHC showed elevated R > L heart filling pressures with mild pulmonary venous hypertension, preserved cardiac output but low PAPi.  I will have her increase her torsemide  to 60 mg bid.   TTE 03/03/23: IMPRESSIONS: 1. Poor acoustic windows limit study.  2. Left ventricular ejection fraction, by estimation, is 60 to 65%. The  left ventricle has  normal function. The left ventricle has no regional  wall motion abnormalities.  3. Right ventricular systolic function is normal. The right ventricular  size is normal.  4. Trivial mitral valve regurgitation.  5. The aortic valve was not well visualized. Aortic valve regurgitation  is not visualized. No stenosis.   )         Anesthesia Quick Evaluation

## 2023-11-08 ENCOUNTER — Ambulatory Visit (HOSPITAL_COMMUNITY): Payer: Self-pay | Admitting: Physician Assistant

## 2023-11-08 ENCOUNTER — Ambulatory Visit (HOSPITAL_BASED_OUTPATIENT_CLINIC_OR_DEPARTMENT_OTHER): Payer: Self-pay | Admitting: Physician Assistant

## 2023-11-08 ENCOUNTER — Encounter (HOSPITAL_COMMUNITY): Payer: Self-pay | Admitting: Orthopedic Surgery

## 2023-11-08 ENCOUNTER — Ambulatory Visit (HOSPITAL_COMMUNITY)
Admission: RE | Admit: 2023-11-08 | Discharge: 2023-11-08 | Disposition: A | Discharge status: Home / Self Care | Attending: Orthopedic Surgery | Admitting: Orthopedic Surgery

## 2023-11-08 ENCOUNTER — Other Ambulatory Visit: Payer: Self-pay

## 2023-11-08 ENCOUNTER — Encounter (HOSPITAL_COMMUNITY)
Admission: RE | Disposition: A | Discharge status: Home / Self Care | Payer: Self-pay | Source: Home / Self Care | Attending: Orthopedic Surgery

## 2023-11-08 DIAGNOSIS — S63591A Other specified sprain of right wrist, initial encounter: Secondary | ICD-10-CM | POA: Insufficient documentation

## 2023-11-08 DIAGNOSIS — E119 Type 2 diabetes mellitus without complications: Secondary | ICD-10-CM | POA: Diagnosis not present

## 2023-11-08 DIAGNOSIS — I110 Hypertensive heart disease with heart failure: Secondary | ICD-10-CM

## 2023-11-08 DIAGNOSIS — Z794 Long term (current) use of insulin: Secondary | ICD-10-CM | POA: Insufficient documentation

## 2023-11-08 DIAGNOSIS — Z7984 Long term (current) use of oral hypoglycemic drugs: Secondary | ICD-10-CM | POA: Diagnosis not present

## 2023-11-08 DIAGNOSIS — I5030 Unspecified diastolic (congestive) heart failure: Secondary | ICD-10-CM | POA: Insufficient documentation

## 2023-11-08 DIAGNOSIS — X58XXXA Exposure to other specified factors, initial encounter: Secondary | ICD-10-CM | POA: Insufficient documentation

## 2023-11-08 DIAGNOSIS — G4733 Obstructive sleep apnea (adult) (pediatric): Secondary | ICD-10-CM

## 2023-11-08 DIAGNOSIS — I5033 Acute on chronic diastolic (congestive) heart failure: Secondary | ICD-10-CM

## 2023-11-08 DIAGNOSIS — I272 Pulmonary hypertension, unspecified: Secondary | ICD-10-CM | POA: Insufficient documentation

## 2023-11-08 DIAGNOSIS — F32A Depression, unspecified: Secondary | ICD-10-CM | POA: Insufficient documentation

## 2023-11-08 DIAGNOSIS — M329 Systemic lupus erythematosus, unspecified: Secondary | ICD-10-CM | POA: Diagnosis not present

## 2023-11-08 DIAGNOSIS — Z87891 Personal history of nicotine dependence: Secondary | ICD-10-CM | POA: Insufficient documentation

## 2023-11-08 DIAGNOSIS — M654 Radial styloid tenosynovitis [de Quervain]: Secondary | ICD-10-CM | POA: Diagnosis present

## 2023-11-08 DIAGNOSIS — E785 Hyperlipidemia, unspecified: Secondary | ICD-10-CM | POA: Insufficient documentation

## 2023-11-08 DIAGNOSIS — F419 Anxiety disorder, unspecified: Secondary | ICD-10-CM | POA: Insufficient documentation

## 2023-11-08 DIAGNOSIS — K219 Gastro-esophageal reflux disease without esophagitis: Secondary | ICD-10-CM | POA: Insufficient documentation

## 2023-11-08 DIAGNOSIS — Z79899 Other long term (current) drug therapy: Secondary | ICD-10-CM | POA: Insufficient documentation

## 2023-11-08 HISTORY — PX: WRIST ARTHROSCOPY: SHX838

## 2023-11-08 HISTORY — PX: DORSAL COMPARTMENT RELEASE: SHX5039

## 2023-11-08 LAB — BASIC METABOLIC PANEL WITH GFR
Anion gap: 8 (ref 5–15)
BUN: 17 mg/dL (ref 6–20)
CO2: 22 mmol/L (ref 22–32)
Calcium: 8.7 mg/dL — ABNORMAL LOW (ref 8.9–10.3)
Chloride: 106 mmol/L (ref 98–111)
Creatinine, Ser: 0.54 mg/dL (ref 0.44–1.00)
GFR, Estimated: >60 mL/min (ref >60)
Glucose, Bld: 175 mg/dL — ABNORMAL HIGH (ref 70–99)
Potassium: 4.2 mmol/L (ref 3.5–5.1)
Sodium: 136 mmol/L (ref 135–145)

## 2023-11-08 LAB — GLUCOSE, CAPILLARY
Glucose-Capillary: 192 mg/dL — ABNORMAL HIGH (ref 70–99)
Glucose-Capillary: 145 mg/dL — ABNORMAL HIGH (ref 70–99)
Glucose-Capillary: 172 mg/dL — ABNORMAL HIGH (ref 70–99)
Glucose-Capillary: 191 mg/dL — ABNORMAL HIGH (ref 70–99)

## 2023-11-08 LAB — CBC
HCT: 41.4 % (ref 36.0–46.0)
Hemoglobin: 12.8 g/dL (ref 12.0–15.0)
MCH: 25.9 pg — ABNORMAL LOW (ref 26.0–34.0)
MCHC: 30.9 g/dL (ref 30.0–36.0)
MCV: 83.8 fL (ref 80.0–100.0)
Platelets: 234 K/uL (ref 150–400)
RBC: 4.94 MIL/uL (ref 3.87–5.11)
RDW: 16.3 % — ABNORMAL HIGH (ref 11.5–15.5)
WBC: 10.7 K/uL — ABNORMAL HIGH (ref 4.0–10.5)
nRBC: 0 % (ref 0.0–0.2)

## 2023-11-08 SURGERY — ARTHROSCOPY, WRIST
Anesthesia: General | Site: Wrist | Laterality: Right

## 2023-11-08 MED ORDER — ACETAMINOPHEN 10 MG/ML IV SOLN
INTRAVENOUS | Status: DC | PRN
  Administered 2023-11-08: 1000 mg via INTRAVENOUS

## 2023-11-08 MED ORDER — SUGAMMADEX SODIUM 200 MG/2ML IV SOLN
INTRAVENOUS | Status: AC
  Filled 2023-11-08: qty 2

## 2023-11-08 MED ORDER — ROCURONIUM BROMIDE 10 MG/ML (PF) SYRINGE
PREFILLED_SYRINGE | INTRAVENOUS | Status: AC
  Filled 2023-11-08: qty 10

## 2023-11-08 MED ORDER — SUCCINYLCHOLINE CHLORIDE 200 MG/10ML IV SOSY
PREFILLED_SYRINGE | INTRAVENOUS | Status: DC | PRN
  Administered 2023-11-08: 140 mg via INTRAVENOUS

## 2023-11-08 MED ORDER — DROPERIDOL 2.5 MG/ML IJ SOLN
INTRAMUSCULAR | Status: AC
  Filled 2023-11-08: qty 2

## 2023-11-08 MED ORDER — ONDANSETRON HCL 4 MG/2ML IJ SOLN
INTRAMUSCULAR | Status: DC | PRN
  Administered 2023-11-08: 4 mg via INTRAVENOUS

## 2023-11-08 MED ORDER — INSULIN ASPART 100 UNIT/ML IJ SOLN
INTRAMUSCULAR | Status: AC
  Administered 2023-11-08: 4 [IU] via SUBCUTANEOUS
  Filled 2023-11-08: qty 1

## 2023-11-08 MED ORDER — MIDAZOLAM HCL 2 MG/2ML IJ SOLN
INTRAMUSCULAR | Status: AC
  Filled 2023-11-08: qty 2

## 2023-11-08 MED ORDER — KETOROLAC TROMETHAMINE 30 MG/ML IJ SOLN
INTRAMUSCULAR | Status: DC | PRN
  Administered 2023-11-08: 30 mg via INTRAVENOUS

## 2023-11-08 MED ORDER — OXYCODONE HCL 5 MG PO TABS: 5.0000 mg | ORAL_TABLET | Freq: Once | ORAL | Status: DC | PRN

## 2023-11-08 MED ORDER — ACETAMINOPHEN 500 MG PO TABS: 1000.0000 mg | ORAL_TABLET | Freq: Once | ORAL | Status: DC

## 2023-11-08 MED ORDER — DEXMEDETOMIDINE HCL IN NACL 80 MCG/20ML IV SOLN
INTRAVENOUS | Status: AC
  Filled 2023-11-08: qty 20

## 2023-11-08 MED ORDER — MIDAZOLAM HCL 2 MG/2ML IJ SOLN
INTRAMUSCULAR | Status: DC | PRN
  Administered 2023-11-08: 1 mg via INTRAVENOUS

## 2023-11-08 MED ORDER — ROCURONIUM BROMIDE 10 MG/ML (PF) SYRINGE
PREFILLED_SYRINGE | INTRAVENOUS | Status: DC | PRN
  Administered 2023-11-08: 10 mg via INTRAVENOUS
  Administered 2023-11-08: 30 mg via INTRAVENOUS
  Administered 2023-11-08: 10 mg via INTRAVENOUS
  Administered 2023-11-08: 30 mg via INTRAVENOUS

## 2023-11-08 MED ORDER — CEFAZOLIN SODIUM-DEXTROSE 3-4 GM/150ML-% IV SOLN
3.0000 g | INTRAVENOUS | Status: AC
  Administered 2023-11-08: 3 g via INTRAVENOUS
  Filled 2023-11-08: qty 150

## 2023-11-08 MED ORDER — SODIUM CHLORIDE 0.9 % IR SOLN
Status: DC | PRN
  Administered 2023-11-08: 2000 mL

## 2023-11-08 MED ORDER — PROPOFOL 10 MG/ML IV BOLUS
INTRAVENOUS | Status: DC | PRN
  Administered 2023-11-08: 200 mg via INTRAVENOUS

## 2023-11-08 MED ORDER — DEXMEDETOMIDINE HCL IN NACL 80 MCG/20ML IV SOLN
INTRAVENOUS | Status: DC | PRN
  Administered 2023-11-08: 4 ug via INTRAVENOUS
  Administered 2023-11-08 (×2): 8 ug via INTRAVENOUS

## 2023-11-08 MED ORDER — SUGAMMADEX SODIUM 200 MG/2ML IV SOLN
INTRAVENOUS | Status: DC | PRN
  Administered 2023-11-08: 400 mg via INTRAVENOUS

## 2023-11-08 MED ORDER — FENTANYL CITRATE (PF) 250 MCG/5ML IJ SOLN
INTRAMUSCULAR | Status: DC | PRN
  Administered 2023-11-08 (×2): 50 ug via INTRAVENOUS
  Administered 2023-11-08: 100 ug via INTRAVENOUS
  Administered 2023-11-08: 50 ug via INTRAVENOUS
  Administered 2023-11-08: 100 ug via INTRAVENOUS
  Administered 2023-11-08: 50 ug via INTRAVENOUS

## 2023-11-08 MED ORDER — FENTANYL CITRATE (PF) 250 MCG/5ML IJ SOLN
INTRAMUSCULAR | Status: AC
  Filled 2023-11-08: qty 5

## 2023-11-08 MED ORDER — FENTANYL CITRATE (PF) 100 MCG/2ML IJ SOLN
INTRAMUSCULAR | Status: AC
  Filled 2023-11-08: qty 2

## 2023-11-08 MED ORDER — SUGAMMADEX SODIUM 200 MG/2ML IV SOLN
INTRAVENOUS | Status: AC
  Filled 2023-11-08: qty 4

## 2023-11-08 MED ORDER — INSULIN ASPART 100 UNIT/ML IJ SOLN: 0.0000 [IU] | INTRAMUSCULAR | Status: DC | PRN

## 2023-11-08 MED ORDER — ONDANSETRON HCL 4 MG/2ML IJ SOLN
INTRAMUSCULAR | Status: AC
  Filled 2023-11-08: qty 2

## 2023-11-08 MED ORDER — LIDOCAINE 2% (20 MG/ML) 5 ML SYRINGE
INTRAMUSCULAR | Status: DC | PRN
  Administered 2023-11-08: 40 mg via INTRAVENOUS

## 2023-11-08 MED ORDER — PHENYLEPHRINE 80 MCG/ML (10ML) SYRINGE FOR IV PUSH (FOR BLOOD PRESSURE SUPPORT)
PREFILLED_SYRINGE | INTRAVENOUS | Status: AC
  Filled 2023-11-08: qty 10

## 2023-11-08 MED ORDER — LACTATED RINGERS IV SOLN: INTRAVENOUS | Status: DC

## 2023-11-08 MED ORDER — SCOPOLAMINE 1 MG/3DAYS TD PT72: 1.0000 | MEDICATED_PATCH | TRANSDERMAL | Status: DC

## 2023-11-08 MED ORDER — DROPERIDOL 2.5 MG/ML IJ SOLN
0.6250 mg | Freq: Once | INTRAMUSCULAR | Status: AC | PRN
  Administered 2023-11-08: 0.625 mg via INTRAVENOUS

## 2023-11-08 MED ORDER — ORAL CARE MOUTH RINSE: 15.0000 mL | Freq: Once | OROMUCOSAL | Status: AC

## 2023-11-08 MED ORDER — LIDOCAINE 2% (20 MG/ML) 5 ML SYRINGE
INTRAMUSCULAR | Status: AC
  Filled 2023-11-08: qty 5

## 2023-11-08 MED ORDER — OXYCODONE HCL 5 MG/5ML PO SOLN: 5.0000 mg | Freq: Once | ORAL | Status: DC | PRN

## 2023-11-08 MED ORDER — PROPOFOL 10 MG/ML IV BOLUS
INTRAVENOUS | Status: AC
  Filled 2023-11-08: qty 20

## 2023-11-08 MED ORDER — DEXAMETHASONE SODIUM PHOSPHATE 10 MG/ML IJ SOLN
INTRAMUSCULAR | Status: AC
  Filled 2023-11-08: qty 1

## 2023-11-08 MED ORDER — OXYCODONE HCL 5 MG PO TABS: 5.0000 mg | ORAL_TABLET | Freq: Four times a day (QID) | ORAL | 0 refills | Status: AC | PRN

## 2023-11-08 MED ORDER — EPHEDRINE 5 MG/ML INJ
INTRAVENOUS | Status: AC
  Filled 2023-11-08: qty 5

## 2023-11-08 MED ORDER — CHLORHEXIDINE GLUCONATE 0.12 % MT SOLN
15.0000 mL | Freq: Once | OROMUCOSAL | Status: AC
  Administered 2023-11-08: 15 mL via OROMUCOSAL
  Filled 2023-11-08: qty 15

## 2023-11-08 MED ORDER — ACETAMINOPHEN 10 MG/ML IV SOLN: 1000.0000 mg | Freq: Once | INTRAVENOUS | Status: DC | PRN

## 2023-11-08 MED ORDER — FENTANYL CITRATE (PF) 100 MCG/2ML IJ SOLN
25.0000 ug | INTRAMUSCULAR | Status: DC | PRN
  Administered 2023-11-08: 50 ug via INTRAVENOUS

## 2023-11-08 SURGICAL SUPPLY — 47 items
ANCHOR SUT MINI BC 2.5X8 PLOCK (Anchor) IMPLANT
BNDG COHESIVE 3X5 TAN ST LF (GAUZE/BANDAGES/DRESSINGS) ×2 IMPLANT
BNDG COMPR ESMARK 4X3 LF (GAUZE/BANDAGES/DRESSINGS) IMPLANT
BNDG ELASTIC 4INX 5YD STR LF (GAUZE/BANDAGES/DRESSINGS) IMPLANT
BNDG ELASTIC 6INX 5YD STR LF (GAUZE/BANDAGES/DRESSINGS) IMPLANT
BNDG GAUZE DERMACEA FLUFF 4 (GAUZE/BANDAGES/DRESSINGS) ×2 IMPLANT
CORD BIPOLAR FORCEPS 12FT (ELECTRODE) ×2 IMPLANT
COVER SURGICAL LIGHT HANDLE (MISCELLANEOUS) ×2 IMPLANT
CUFF TRNQT CYL 24X4X16.5-23 (TOURNIQUET CUFF) IMPLANT
DRAPE U-SHAPE 47X51 STRL (DRAPES) ×2 IMPLANT
DRSG XEROFORM 1X8 (GAUZE/BANDAGES/DRESSINGS) ×2 IMPLANT
GAUZE SPONGE 4X4 12PLY STRL (GAUZE/BANDAGES/DRESSINGS) ×2 IMPLANT
GLOVE BIO SURGEON STRL SZ7 (GLOVE) ×2 IMPLANT
GLOVE BIOGEL PI IND STRL 7.0 (GLOVE) ×2 IMPLANT
GOWN STRL REUS W/ TWL LRG LVL3 (GOWN DISPOSABLE) ×4 IMPLANT
GOWN STRL REUS W/ TWL XL LVL3 (GOWN DISPOSABLE) ×2 IMPLANT
KIT BASIN OR (CUSTOM PROCEDURE TRAY) ×2 IMPLANT
KIT MINI BIO ANCHOR DRILL (KITS) IMPLANT
KIT TURNOVER KIT B (KITS) ×2 IMPLANT
MANIFOLD NEPTUNE II (INSTRUMENTS) ×2 IMPLANT
NDL 18GX1X1/2 (RX/OR ONLY) (NEEDLE) IMPLANT
NDL HYPO 21X1.5 SAFETY (NEEDLE) ×2 IMPLANT
NEEDLE 18GX1X1/2 (RX/OR ONLY) (NEEDLE) ×2 IMPLANT
NEEDLE HYPO 21X1.5 SAFETY (NEEDLE) ×2 IMPLANT
NS IRRIG 1000ML POUR BTL (IV SOLUTION) ×2 IMPLANT
PACK ORTHO EXTREMITY (CUSTOM PROCEDURE TRAY) ×2 IMPLANT
PAD ARMBOARD POSITIONER FOAM (MISCELLANEOUS) ×4 IMPLANT
PAD CAST 4YDX4 CTTN HI CHSV (CAST SUPPLIES) ×6 IMPLANT
SET CYSTO W/LG BORE CLAMP LF (SET/KITS/TRAYS/PACK) IMPLANT
SET SM JOINT TUBING/CANN (CANNULA) ×2 IMPLANT
SHAVER DISSECTOR 3.0 (BURR) IMPLANT
SLING ARM FOAM STRAP XLG (SOFTGOODS) IMPLANT
SPLINT FIBERGLASS 4X30 (CAST SUPPLIES) IMPLANT
SUT ETHIBOND 3-0 V-5 (SUTURE) ×2 IMPLANT
SUT ETHIBOND 4 0 TF (SUTURE) IMPLANT
SUT ETHILON 4 0 PS 2 18 (SUTURE) ×2 IMPLANT
SUT MNCRL AB 3-0 PS2 18 (SUTURE) IMPLANT
SUTURE FIBERWR 2-0 18 17.9 3/8 (SUTURE) IMPLANT
SYR 20ML LL LF (SYRINGE) IMPLANT
SYR CONTROL 10ML LL (SYRINGE) IMPLANT
TOWEL GREEN STERILE FF (TOWEL DISPOSABLE) ×2 IMPLANT
TRAP DIGIT (INSTRUMENTS) IMPLANT
TUBE CONNECTING 12X1/4 (SUCTIONS) ×2 IMPLANT
TUBING ARTHROSCOPY IRRIG 16FT (MISCELLANEOUS) IMPLANT
UNDERPAD 30X36 HEAVY ABSORB (UNDERPADS AND DIAPERS) ×2 IMPLANT
WAND APOLLORF SJ50 AR-9845 (SURGICAL WAND) IMPLANT
WATER STERILE IRR 1000ML POUR (IV SOLUTION) ×2 IMPLANT

## 2023-11-08 NOTE — Transfer of Care (Signed)
 Immediate Anesthesia Transfer of Care Note  Patient: Kathleen Walsh  Procedure(s) Performed: ARTHROSCOPY, WRIST (Right: Wrist) RELEASE, FIRST DORSAL COMPARTMENT, HAND (Right)  Patient Location: PACU  Anesthesia Type:General  Level of Consciousness: awake, alert , and oriented  Airway & Oxygen Therapy: Patient Spontanous Breathing and Patient connected to face mask oxygen  Post-op Assessment: Report given to RN and Post -op Vital signs reviewed and stable  Post vital signs: Reviewed and stable  Last Vitals:  Vitals Value Taken Time  BP 155/65 11/08/23 16:01  Temp 36.3 C 11/08/23 16:00  Pulse 89 11/08/23 16:05  Resp 36 11/08/23 16:05  SpO2 93 % 11/08/23 16:05  Vitals shown include unfiled device data.  Last Pain:  Vitals:   11/08/23 1039  TempSrc:   PainSc: 3       Patients Stated Pain Goal: 1 (11/08/23 1039)  Complications: No notable events documented.

## 2023-11-08 NOTE — H&P (Signed)
 HAND SURGERY   HPI: Patient is a 43 y.o. female who presents with persistent right radial, dorsal, and ulnar wrist pain.  This has been going on for many months now.  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  In addition, she has evidence of de Quervain's tenosynovitis.  This is failed extensive nonsurgical management with bracing, activity modification, and corticosteroid injections..  Patient denies any changes to their medical history or new systemic symptoms today.    Past Medical History:  Diagnosis Date   Abdominal wall pain    Anemia    Anxiety    Blood dyscrasia    lupus anticoagulant during pregnancy   Complication of anesthesia    pt states became  panicky after left shoulder surgery 10/2019   Depression    takes cymbalta    Diabetes mellitus without complication (HCC)    Headache(784.0)    migraines   Hx of lupus anticoagulant disorder    Hyperlipidemia    Hypertension    Obesity    PONV (postoperative nausea and vomiting)    Sleep apnea    USES C-PAP   Past Surgical History:  Procedure Laterality Date   ABDOMINAL HYSTERECTOMY  2016   ANKLE ARTHROSCOPY WITH REPAIR SUBLUXING TENDON Left    BIOPSY  11/22/2021   Procedure: BIOPSY;  Surgeon: Federico Rosario BROCKS, MD;  Location: [facility];  Service: Gastroenterology;;   CESAREAN SECTION WITH BILATERAL TUBAL LIGATION Bilateral 02/11/2013   Procedure: Repeat CESAREAN SECTION WITH BILATERAL TUBAL LIGATION;  Surgeon: Laelle Bridgett JINNY Flowers, MD;  Location: [facility];  Service: Obstetrics;  Laterality: Bilateral;  EDD: 03/01/13   COLONOSCOPY WITH PROPOFOL  N/A 11/22/2021   Procedure: COLONOSCOPY WITH PROPOFOL ;  Surgeon: Federico Rosario BROCKS, MD;  Location: [facility];  Service: Gastroenterology;  Laterality: N/A;   DEBRIDEMENT OF ABDOMINAL WALL ABSCESS N/A 12/03/2014    Procedure: INCISION OF ABDOMINAL WALL LIPOMA;  Surgeon: Camellia Blush, MD;  Location: [facility];  Service: General;  Laterality: N/A;   DILATION AND CURETTAGE OF UTERUS  2004   DILITATION & CURRETTAGE/HYSTROSCOPY WITH NOVASURE ABLATION N/A 11/19/2014   Procedure: DILATATION & CURETTAGE/HYSTEROSCOPY WITH NOVASURE ABLATION;  Surgeon: Leotis Isham Flowers, MD;  Location: [facility];  Service: Gynecology;  Laterality: N/A;   ENDOMETRIAL ABLATION  10/20/2014   ESOPHAGOGASTRODUODENOSCOPY (EGD) WITH PROPOFOL  N/A 11/22/2021   Procedure: ESOPHAGOGASTRODUODENOSCOPY (EGD) WITH PROPOFOL ;  Surgeon: Federico Rosario BROCKS, MD;  Location: [facility];  Service: Gastroenterology;  Laterality: N/A;   FOOT SURGERY  2008   HERNIA REPAIR  09/19/2011   INCISION AND DRAINAGE ABSCESS N/A 12/17/2022   Procedure: INCISION AND DRAINAGE PERIMANDIBULAR ABSCESS;  Surgeon: Carlie Clark, MD;  Location: [facility];  Service: ENT;  Laterality: N/A;   LAPAROSCOPY N/A 12/03/2014   Procedure: LAPAROSCOPY DIAGNOSTIC WITH LYSIS OF ADHESIONS;  Surgeon: Camellia Blush, MD;  Location: [facility];  Service: General;  Laterality: N/A;   left shoulder surgery      NASAL SEPTUM SURGERY     POLYPECTOMY  11/22/2021   Procedure: POLYPECTOMY;  Surgeon: Federico Rosario BROCKS, MD;  Location: [facility];  Service: Gastroenterology;;  EGD and COLON   RIGHT HEART CATH N/A 05/05/2023   Procedure: RIGHT HEART CATH;  Surgeon: Rolan Ezra RAMAN, MD;  Location: [facility];  Service: Cardiovascular;  Laterality: N/A;   TUBAL LIGATION     VENTRAL HERNIA REPAIR  09/20/2010   Laparoscopic, Dr Camellia Blush   WISDOM TOOTH EXTRACTION     Social History   Socioeconomic History   Marital status: Married    Spouse name: Not on file   Number of children: 2   Years of education: Not on file   Highest education level: Some college, no degree  Occupational History   Occupation: None  Tobacco Use   Smoking status: Former    Current packs/day: 0.75    Average packs/day: 0.7 packs/day  for 23.6 years (17.7 ttl pk-yrs)    Types: Cigarettes    Start date: 05/2017   Smokeless tobacco: Never  Vaping Use   Vaping status: Never Used  Substance and Sexual Activity   Alcohol use: No    Comment: once every three years    Drug use: No   Sexual activity: Yes    Birth control/protection: Surgical  Other Topics Concern   Not on file  Social History Narrative   Not on file   Social Drivers of Health   Financial Resource Strain: Low Risk  (04/11/2023)   Overall Financial Resource Strain (CARDIA)    Difficulty of Paying Living Expenses: Not very hard  Food Insecurity: No Food Insecurity (07/06/2023)   Hunger Vital Sign    Worried About Running Out of Food in the Last Year: Never true    Ran Out of Food in the Last Year: Never true  Transportation Needs: No Transportation Needs (07/06/2023)   PRAPARE - Administrator, Civil Service (Medical): No    Lack of Transportation (Non-Medical): No  Physical Activity: Not on file  Stress: Not on file  Social Connections: Not on file   Family History  Problem Relation Age of Onset   Diabetes Mother    Hypertension Mother    Heart disease Mother    Diabetes Father    Hypertension Father    Colon polyps Father    Colitis Father    Irritable bowel syndrome Father    Breast cancer Maternal Grandmother    Crohn's disease Maternal Grandmother    Prostate cancer Maternal Grandfather    Clotting disorder Paternal Grandmother    Leukemia Paternal Grandfather    Irritable bowel syndrome Daughter    Stomach cancer Neg Hx    Esophageal cancer Neg Hx    - negative except otherwise stated in the family history section Allergies  Allergen Reactions   Augmentin  [Amoxicillin -Pot Clavulanate] Anaphylaxis   Amoxicillin  Nausea And Vomiting        Metformin  And Related Nausea And Vomiting   Ozempic (0.25 Or 0.5 Mg-Dose) [Semaglutide(0.25 Or 0.5mg -Dos)] Nausea And Vomiting   Trulicity [Dulaglutide] Nausea And Vomiting    Prior to Admission medications   Medication Sig Start Date End Date Taking? Authorizing Provider  dapagliflozin  propanediol (FARXIGA ) 10 MG TABS tablet Take 1 tablet (10 mg total) by mouth daily. 03/22/23  Yes Marcine Catalan M, PA-C  famotidine  (PEPCID ) 10 MG tablet Take 10 mg by mouth daily as needed for heartburn or indigestion.   Yes [provider]  insulin  glargine (LANTUS ) 100 UNIT/ML injection Inject 90 Units into the skin at bedtime.   Yes [provider]  levocetirizine (XYZAL) 5 MG tablet Take 5 mg by mouth daily as needed for allergies.   Yes [provider]  lisinopril  (ZESTRIL ) 20 MG tablet Take 20 mg by mouth daily.   Yes [provider]  melatonin 5 MG TABS Take 5 mg by mouth at bedtime as needed (  insomnia).   Yes [provider]  Naphazoline HCl (CLEAR EYES OP) Place 1 drop into both eyes daily as needed (allergies).   Yes [provider]  NOVOLOG  FLEXPEN 100 UNIT/ML FlexPen Inject 30-50 Units into the skin 3 (three) times daily before meals. Per sliding scale 11/07/22  Yes [provider]  ondansetron  (ZOFRAN ) 8 MG tablet TAKE 1 TABLET (8 MG TOTAL) BY MOUTH 3 (THREE) TIMES DAILY AS NEEDED FOR NAUSEA OR VOMITING. 04/18/23  Yes Federico Rosario BROCKS, MD  potassium chloride  (KLOR-CON  M) 10 MEQ tablet Take 4 tablets (40 mEq total) by mouth daily. 09/05/23  Yes Lonni Slain, MD  rizatriptan  (MAXALT -MLT) 10 MG disintegrating tablet Take 10 mg by mouth as needed for migraine. 11/10/22  Yes [provider]  rosuvastatin  (CRESTOR ) 40 MG tablet Take 40 mg by mouth daily.   Yes [provider]  spironolactone  (ALDACTONE ) 25 MG tablet Take 1 tablet (25 mg total) by mouth daily. 03/08/23  Yes Cindy Garnette POUR, MD  torsemide  (DEMADEX ) 20 MG tablet Take 4 tablets (80 mg total) by mouth every morning AND 3 tablets (60 mg total) every evening. 07/31/23  Yes [place], Gering, FNP   No results found. - Positive  ROS: All other systems have been reviewed and were otherwise negative with the exception of those mentioned in the HPI and as above.  Physical Exam: General: No acute distress, resting comfortably Cardiovascular: BUE warm and well perfused, normal rate Respiratory: Normal WOB on RA Skin: Warm and dry Neurologic: Sensation intact distally Psychiatric: Patient is at baseline mood and affect  Right Upper Extremity  Mild diffuse swelling of the wrist. Significant tenderness to palpation directly over the radial styloid and along the first dorsal compartment. She is also tender to palpation at the scapholunate interval, lunotriquetral interval, and direct ulnar aspect of the wrist. She has limited forearm rotation secondary to pain. She is about 45 degrees each of wrist flexion extension limited by pain. She has full active range of motion of her fingers. She has a strongly positive Finkelstein maneuver. Sensation is intact light touch in the median, ulnar, radial nerve distributions. All fingers are warm and well-perfused with brisk capillary refill.  Assessment: 43 year old female with persistent and diffuse to wrist pain with likely de Quervain's tenosynovitis and MRI findings consistent with partial tearing of the scapholunate ligament and fraying of the TFCC.  This is failed extensive nonsurgical management with activity modification, bracing, oral corticosteroid taper, and corticosteroid injection  Plan: OR today for right first dorsal compartment release and right wrist arthroscopy with debridement and repair as necessary.  We reviewed the risks of surgery which include bleeding, infection, damage to neurovascular structures, persistent symptoms, wrist swelling, tendon instability at the first dorsal compartment, persistent pain, delayed wound healing, wrist stiffness, need for additional surgery.     Bebe Galla, M.D. [facility] 8:24 AM

## 2023-11-08 NOTE — Discharge Instructions (Addendum)
 Waylan Rocher, M.D. Hand Surgery  POST-OPERATIVE DISCHARGE INSTRUCTIONS   PRESCRIPTIONS: You may have been given a prescription to be taken as directed for post-operative pain control.  You may also take over the counter ibuprofen/aleve and tylenol for pain. Take this as directed on the packaging. Do not exceed 3000 mg tylenol/acetaminophen in 24 hours.  Ibuprofen 600-800 mg (3-4) tablets by mouth every 6 hours as needed for pain.  OR Aleve 2 tablets by mouth every 12 hours (twice daily) as needed for pain.  AND/OR Tylenol 1000 mg (2 tablets) every 8 hours as needed for pain.  Please use your pain medication carefully, as refills are limited and you may not be provided with one.  As stated above, please use over the counter pain medicine - it will also be helpful with decreasing your swelling.    ANESTHESIA: After your surgery, post-surgical discomfort or pain is likely. This discomfort can last several days to a few weeks. At certain times of the day your discomfort may be more intense.   Did you receive a nerve block?  A nerve block can provide pain relief for one hour to two days after your surgery. As long as the nerve block is working, you will experience little or no sensation in the area the surgeon operated on.  As the nerve block wears off, you will begin to experience pain or discomfort. It is very important that you begin taking your prescribed pain medication before the nerve block fully wears off. Treating your pain at the first sign of the block wearing off will ensure your pain is better controlled and more tolerable when full-sensation returns. Do not wait until the pain is intolerable, as the medicine will be less effective. It is better to treat pain in advance than to try and catch up.   General Anesthesia:  If you did not receive a nerve block during your surgery, you will need to start taking your pain medication shortly after your surgery and should continue  to do so as prescribed by your surgeon.     ICE AND ELEVATION: You may use ice for the first 48-72 hours, but it is not critical.   Motion of your fingers is very important to decrease the swelling.  Elevation, as much as possible for the next 48 hours, is critical for decreasing swelling as well as for pain relief. Elevation means when you are seated or lying down, you hand should be at or above your heart. When walking, the hand needs to be at or above the level of your elbow.  If the bandage gets too tight, it may need to be loosened. Please contact our office and we will instruct you in how to do this.    SURGICAL BANDAGES:  Keep your dressing and/or splint clean and dry at all times.  Do not remove until you are seen again in the office.  If careful, you may place a plastic bag over your bandage and tape the end to shower, but be careful, do not get your bandages wet.     HAND THERAPY:  You may not need any. If you do, we will begin this at your follow up visit in the clinic.    ACTIVITY AND WORK: You are encouraged to move any fingers which are not in the bandage.  Light use of the fingers is allowed to assist the other hand with daily hygiene and eating, but strong gripping or lifting is often uncomfortable and  should be avoided.  You might miss a variable period of time from work and hopefully this issue has been discussed prior to surgery. You may not do any heavy work with your affected hand for about 2 weeks.    [facility] Second Floor, [address] [place], [place] [address] [phone]

## 2023-11-08 NOTE — Anesthesia Procedure Notes (Signed)
 Procedure Name: Intubation Date/Time: 11/08/2023 1:18 PM  Performed by: Delores Dus, CRNAPre-anesthesia Checklist: Patient identified, Emergency Drugs available, Suction available and Patient being monitored Patient Re-evaluated:Patient Re-evaluated prior to induction Oxygen Delivery Method: Circle system utilized Preoxygenation: Pre-oxygenation with 100% oxygen Induction Type: IV induction Ventilation: Mask ventilation without difficulty Laryngoscope Size: Glidescope and 3 Tube type: Oral Tube size: 7.0 mm Number of attempts: 1 Airway Equipment and Method: Stylet and Oral airway Placement Confirmation: ETT inserted through vocal cords under direct vision, positive ETCO2 and breath sounds checked- equal and bilateral Secured at: 22 cm Tube secured with: Tape Dental Injury: Teeth and Oropharynx as per pre-operative assessment

## 2023-11-08 NOTE — Interval H&P Note (Signed)
 History and Physical Interval Note:  11/08/2023 12:52 PM  Kathleen Walsh  has presented today for surgery, with the diagnosis of Right de quervain's tenosynovitis, right wrist synovitis with triangular fibrocartilage complex tear.  The various methods of treatment have been discussed with the patient and family. After consideration of risks, benefits and other options for treatment, the patient has consented to  Procedure(s) with comments: ARTHROSCOPY, WRIST (Right) - right wrist arthroscopy with debridement and synovectomy as indicated, possible triangular fibrocartilage complex repair RELEASE, FIRST DORSAL COMPARTMENT, HAND (Right) - Right first dorsal compartment release, as a surgical intervention.  The patient's history has been reviewed, patient examined, no change in status, stable for surgery.  I have reviewed the patient's chart and labs.  Questions were answered to the patient's satisfaction.     Ranvir Renovato

## 2023-11-08 NOTE — Op Note (Signed)
 Date of Surgery: 11/08/2023  INDICATIONS: Patient is a 43 y.o.-year-old female with right radial and ulnar sided wrist pain for many months.  She has failed extensive nonsurgical management including bracing, activity modification, corticosteroid injections.  Risks, benefits, and alternatives to surgery were again discussed with the patient in the preoperative area. The patient wishes to proceed with surgery.  Informed consent was signed after our discussion.   PREOPERATIVE DIAGNOSIS:  Right de Quervain's tenosynovitis Right TFCC injury Right scapholunate ligament injury  POSTOPERATIVE DIAGNOSIS:  Right de Quervain's tenosynovitis Right partial scapholunate ligament injury Right foveal TFCC tear  PROCEDURE:  Right de Quervain's tenosynovitis Right wrist arthroscopy with debridement of membranous scapholunate ligament injury and synovectomy Right open TFCC repair  SURGEON: Carlin Galla, M.D.  ASSIST:   ANESTHESIA:  General  IV FLUIDS AND URINE: See anesthesia.  ESTIMATED BLOOD LOSS: 25 mL.  IMPLANTS:  Implant Name Type Inv. Item Serial No. Manufacturer Lot No. LRB No. Used Action  ANCHOR SUT MINI BC 2.5X8 PLOCK - [id number] Anchor ANCHOR SUT MINI BC 2.5X8 PLOCK  [employer] [id number] Right 1 Implanted     DRAINS: None  COMPLICATIONS: None noted  DESCRIPTION OF PROCEDURE: The patient was met in the preoperative holding area where the surgical site was marked and the consent form was signed.  The patient was then taken to the operating room and transferred to the operating table.  All bony prominences were well padded.  A tourniquet was applied to the right upper arm.  The right upper arm was taped to the bed to allow for appropriate traction.  General endotracheal anesthesia was induced.  The operative extremity was prepped and draped in the usual and sterile fashion.  A formal time-out was performed to confirm that this was the correct patient, surgery, side, and site.    Following formal timeout, the arthroscopy tower was assembled and placed on the hand table.  The limb was exsanguinated with an Esmarch bandage and the tourniquet inflated to 250 mmHg.  The fingers were placed in finger traps and 10 pounds of traction was applied.  I began by making the 3-4 portal.  The skin was divided using 11 blade scalpel.  This was followed by a small hemostat to enter the joint.  This was followed by the trocar and the camera. There was immediate abundant synovitis within the joint.  The scapholunate ligament is identified.  There was fraying of the membranous portion.  I then turned my attention to examination of the TFCC.  The articular disc was intact.  There was what appeared to be a foveal tear at the periphery.  The 6R portal was then established.  A small probe was used to examine the TFCC.  I was able to put the probe underneath the TFCC through this  torn appearing area at the periphery.  Some associated synovitis in the area around the ulnar joint capsule.  The synovitis was debrided using an arthroscopic wand.  The membranous scapholunate ligament tear was debrided using a sucker shaver.  Following a diagnostic arthroscopy, the camera was withdrawn.  I then turned my attention toward TFCC repair given the foveal tear.  A longitudinal incision was made directly over the ulnar aspect of the wrist starting in the area just distal to the ulnar styloid.  Blunt dissection was used to identify the dorsal cutaneous branch of the ulnar artery which was protected.  The fascia overlying the distal ulna was divided.  The extensor retinaculum over the joint  was divided.  The capsule was then divided longitudinally.  The TFCC was identified.  A horizontal mattress suture was placed using a 2-0 FiberWire suture.  The limbs of this suture were then passed around the ulnar styloid.  The limbs of the suture were then passed through the eyelet of a 2.5 mm push lock anchor.  This was drilled and  appropriately placed.  This provided excellent compression of the peripheral TFCC to the distal ulna.  The periphery of the TFCC was then repaired to the adjacent capsule.  The wound was irrigated.  The capsule was closed using a 3-0 Ethibond suture.  The extensor retinaculum was similarly closed.  The skin was closed in a layered fashion using a 4-0 Monocryl suture followed by a 4-0 nylon suture.  I then turned my attention to the first was compartment release.  A 2.5 centimeter transverse incision was made over the radial aspect of the wrist just proximal to the radial styloid.  The skin was incised taking care not to incise through the subcutaneous tissue.  Blunt dissection was used to identify branches of the superficial radial nerve which were protected throughout the procedure.  Further blunt dissection using Senn retractors was used to identify the first dorsal compartment.  Senn retractors were placed both radially and ulnarly to protect the adjacent neurovascular structures.  The compartment was entered at the dorsal aspect.  A tenotomy scissor was used to release the sheath which contained multiple APL tendon slips and the EPB tendon.  The sheath was completely released both proximally and distally.  There was no separate subsheath containing the EPB tendon.  T Following complete first dorsal compartment release, the wound was thoroughly irrigated with copious sterile saline.    The tourniquet was let down and hemostasis was achieved with direct pressure over the wound.  The wound was closed using a 4-0 nylon suture in horizontal mattress fashion.    The wounds were then dressed with Xeroform, folded Kerlix, cast padding, and a well-padded sugar-tong splint was applied.  The patient was reversed from anesthesia and extubated uneventfully.  They were transferred from the operating table to the postoperative bed.  All counts were correct x 2 at the end of the procedure.  The patient was then taken to  the PACU in stable condition.   POSTOPERATIVE PLAN: She will be discharged to home with appropriate pain medication and discharge instructions.  A referral will be placed to hand therapy.  I will see her back in 2 weeks or so for her first postop visit.  Carlin Galla, MD 4:01 PM

## 2023-11-09 NOTE — Anesthesia Postprocedure Evaluation (Signed)
 Anesthesia Post Note  Patient: Kathleen Walsh  Procedure(s) Performed: ARTHROSCOPY, WRIST (Right: Wrist) RELEASE, FIRST DORSAL COMPARTMENT, HAND (Right)     Patient location during evaluation: PACU Anesthesia Type: General Level of consciousness: awake and alert Pain management: pain level controlled Vital Signs Assessment: post-procedure vital signs reviewed and stable Respiratory status: spontaneous breathing, nonlabored ventilation, respiratory function stable and patient connected to nasal cannula oxygen Cardiovascular status: blood pressure returned to baseline and stable Postop Assessment: no apparent nausea or vomiting Anesthetic complications: no   No notable events documented.  Last Vitals:  Vitals:   11/08/23 1700 11/08/23 1715  BP: 120/67 125/70  Pulse: 85 83  Resp: 19 (!) 26  Temp:  36.4 C  SpO2: 90% 91%    Last Pain:  Vitals:   11/08/23 1715  TempSrc:   PainSc: Asleep                 [name]

## 2023-11-10 ENCOUNTER — Encounter (HOSPITAL_COMMUNITY): Payer: Self-pay | Admitting: Orthopedic Surgery

## 2023-11-20 ENCOUNTER — Encounter (HOSPITAL_COMMUNITY): Payer: Self-pay | Admitting: Cardiology

## 2023-11-20 ENCOUNTER — Ambulatory Visit (HOSPITAL_COMMUNITY)
Admission: RE | Admit: 2023-11-20 | Discharge: 2023-11-20 | Disposition: A | Discharge status: Home / Self Care | Source: Ambulatory Visit | Attending: Cardiology | Admitting: Cardiology

## 2023-11-20 VITALS — BP 118/78 | HR 81

## 2023-11-20 DIAGNOSIS — I2722 Pulmonary hypertension due to left heart disease: Secondary | ICD-10-CM | POA: Diagnosis not present

## 2023-11-20 DIAGNOSIS — Z79899 Other long term (current) drug therapy: Secondary | ICD-10-CM | POA: Diagnosis not present

## 2023-11-20 DIAGNOSIS — Z87891 Personal history of nicotine dependence: Secondary | ICD-10-CM | POA: Diagnosis not present

## 2023-11-20 DIAGNOSIS — Z7984 Long term (current) use of oral hypoglycemic drugs: Secondary | ICD-10-CM | POA: Diagnosis not present

## 2023-11-20 DIAGNOSIS — Z794 Long term (current) use of insulin: Secondary | ICD-10-CM | POA: Diagnosis not present

## 2023-11-20 DIAGNOSIS — E119 Type 2 diabetes mellitus without complications: Secondary | ICD-10-CM | POA: Insufficient documentation

## 2023-11-20 DIAGNOSIS — G4733 Obstructive sleep apnea (adult) (pediatric): Secondary | ICD-10-CM | POA: Insufficient documentation

## 2023-11-20 DIAGNOSIS — I110 Hypertensive heart disease with heart failure: Secondary | ICD-10-CM | POA: Insufficient documentation

## 2023-11-20 DIAGNOSIS — I5032 Chronic diastolic (congestive) heart failure: Secondary | ICD-10-CM | POA: Diagnosis present

## 2023-11-20 NOTE — Patient Instructions (Signed)
 HOLD TORSEMIDE  TUESDAY 070/22/25 AND WEDNESDAY 11/22/23. RESTART ON THURSDAY 11/23/23.  FUROSCIX  Kit Tuesday 11/21/23 and again on Wednesday 11/22/23.  Blood work in 10 days.  Your physician recommends that you schedule a follow-up appointment in: 1 month.  If you have any questions or concerns before your next appointment please send us  a message through [facility] or call our office at [phone].    TO LEAVE A MESSAGE FOR THE NURSE SELECT OPTION 2, PLEASE LEAVE A MESSAGE INCLUDING: YOUR NAME DATE OF BIRTH CALL BACK NUMBER REASON FOR CALL**this is important as we prioritize the call backs  YOU WILL RECEIVE A CALL BACK THE SAME DAY AS LONG AS YOU CALL BEFORE 4:00 PM  At the [facility], you and your health needs are our priority. As part of our continuing mission to provide you with exceptional heart care, we have created designated Provider Care Teams. These Care Teams include your primary Cardiologist (physician) and Advanced Practice Providers (APPs- Physician Assistants and Nurse Practitioners) who all work together to provide you with the care you need, when you need it.   You may see any of the following providers on your designated Care Team at your next follow up: Dr Toribio Fuel Dr Ezra Shuck Dr. Ria Commander Dr. Morene Brownie Amy Lenetta, NP Caffie Shed, [place] [facility] [place], [place] Beckey Coe, NP [place] Lee, NP Ellouise Class, NP Tinnie Redman, PharmD Jaun Bash, PharmD   Please be sure to bring in all your medications bottles to every appointment.    Thank you for choosing [facility]

## 2023-11-20 NOTE — Progress Notes (Signed)
 ADVANCED HF CLINIC NOTE  Primary Care: Practice, [facility] Cardiology: Dr. Rolan  Chief Complaint: CHF   HPI: 43 y.o. former smoker with h/o obesity, OSA on CPAP, HTN, Type 2 DM, lupus anticoagulant (in pregnancy), and diastolic heart failure.   Echo in 10/24 showed EF 60-65%, RV normal, trivial MR. Diuresed 16 lb w/ IV lasix , weight 349>333 lb. Started on GDMT. Referred to [facility] clinic at discharge and then referred to [facility] for RHC to better assess volume status and for [facility] w/u and consideration for CardioMEMs implant.   RHC 05/05/23 with R > L filling pressures and mild pHTN, preserved CO but low PAPi. Unable to successfully place cardiomems due to difficult access. Torsemide  increased.    She presents today for f/u for HFpEF.  She was recently out of town for about a week.  She ate out a lot and had fast food and processed food.  Weight up about 7 lbs.  She has noted more peripheral edema.  Still doing well overall symptomatically, only gets short of breath if she walks a long distance in the heat.  Generally, no dyspnea walking on flat ground or with ADLs.  No chest pain.  No lightheadedness.   Losing wt has been difficult. Recently tried Mounjaro but had to stop due to severe nausea and vomiting. Had same side effects w/ Ozempic. She is now enrolled in Weight Watchers Program.   ECG (personally reviewed): NSR, normal  Labs (4/25): BNP 26 Labs (7/25): K 4.2, creatinine 0.54  PMH: 1. OSA: uses CPAP 2. Morbid obesity: Unable to tolerate Ozempic or Mounjaro 3. Lupus anticoagulant noted in pregnancy.  4. HTN 5. HFpEF: Echo (10/24) with technically difficult windows, EF 60-65%, RV probably normal, unable to esimate PA systolic pressure.  - RHC 1/25: RA 13, PA 37/21 (30), PCWP mean 17, CO/CI (Fick) 5.76/2.32, PAPi 1.2, PVR 2.25 WU-unable to place Cardiomems 6. Type 2 diabetes  Review of Systems: All systems reviewed and negative except as per HPI.   FH: Mother with  CHF, she does not know the details.   SH: Lives in [place], married, 2 children, prior smoker but quit 2017, no ETOH. Out of work.   Current Outpatient Medications  Medication Sig Dispense Refill   dapagliflozin  propanediol (FARXIGA ) 10 MG TABS tablet Take 1 tablet (10 mg total) by mouth daily. 30 tablet 3   famotidine  (PEPCID ) 10 MG tablet Take 10 mg by mouth daily as needed for heartburn or indigestion.     insulin  glargine (LANTUS ) 100 UNIT/ML injection Inject 90 Units into the skin at bedtime.     levocetirizine (XYZAL) 5 MG tablet Take 5 mg by mouth daily as needed for allergies.     lisinopril  (ZESTRIL ) 20 MG tablet Take 20 mg by mouth daily.     melatonin 5 MG TABS Take 5 mg by mouth at bedtime as needed (insomnia).     Naphazoline HCl (CLEAR EYES OP) Place 1 drop into both eyes daily as needed (allergies).     NOVOLOG  FLEXPEN 100 UNIT/ML FlexPen Inject 30-50 Units into the skin 3 (three) times daily before meals. Per sliding scale     ondansetron  (ZOFRAN ) 8 MG tablet TAKE 1 TABLET (8 MG TOTAL) BY MOUTH 3 (THREE) TIMES DAILY AS NEEDED FOR NAUSEA OR VOMITING. 12 tablet 19   oxycodone  (OXY-IR) 5 MG capsule Take 5 mg by mouth as needed.     potassium chloride  (KLOR-CON  M) 10 MEQ tablet Take 4 tablets (40 mEq  total) by mouth daily. 120 tablet 11   rizatriptan  (MAXALT -MLT) 10 MG disintegrating tablet Take 10 mg by mouth as needed for migraine.     rosuvastatin  (CRESTOR ) 40 MG tablet Take 40 mg by mouth daily.     spironolactone  (ALDACTONE ) 25 MG tablet Take 1 tablet (25 mg total) by mouth daily. 30 tablet 0   torsemide  (DEMADEX ) 20 MG tablet Take 4 tablets (80 mg total) by mouth every morning AND 3 tablets (60 mg total) every evening. 200 tablet 3   No current facility-administered medications for this encounter.    Allergies  Allergen Reactions   Augmentin  [Amoxicillin -Pot Clavulanate] Anaphylaxis    Tolerated cefazolin  on multiple occasions    Amoxicillin  Nausea And Vomiting         Metformin  And Related Nausea And Vomiting   Ozempic (0.25 Or 0.5 Mg-Dose) [Semaglutide(0.25 Or 0.5mg -Dos)] Nausea And Vomiting   Trulicity [Dulaglutide] Nausea And Vomiting    Vitals:   11/20/23 1349  BP: 118/78  Pulse: 81  SpO2: 97%    There were no vitals filed for this visit.    Wt Readings from Last 3 Encounters:  11/08/23 (!) 150.6 kg (332 lb)  08/16/23 (!) 156.9 kg (346 lb)  07/31/23 (!) 158.8 kg (350 lb)     PHYSICAL EXAM: General: NAD, obese Neck: Thick, JVP difficult, no thyromegaly or thyroid  nodule.  Lungs: Clear to auscultation bilaterally with normal respiratory effort. CV: Nonpalpable PMI.  Heart regular S1/S2, no S3/S4, no murmur.  1+ edema 1/2 to knees bilaterally.  No carotid bruit.  Normal pedal pulses.  Abdomen: Soft, nontender, no hepatosplenomegaly, no distention.  Skin: Intact without lesions or rashes.  Neurologic: Alert and oriented x 3.  Psych: Normal affect. Extremities: No clubbing or cyanosis.  HEENT: Normal.   ASSESSMENT & PLAN:  1. HFpEF: Echo 10/24 60-65%, RV normal, trivial MR. Attempted cardiomems placement but failed due to very difficult access. RHC 1/25 with elevated filling pressures R>L with preserved CO and low PAPi. She had been doing well until recently when she was out of town with significant dietary indiscretion.  Weight up about 7 lbs and she looks volume overloaded on exam.  - I will have her stop torsemide  for the next 2 days and take Furoscix  80 mg [place] daily x 2 days.  After that, she will resume torsemide  80 qam/60 qpm.  She has adjusted her diet to cut out sodium. BMET/BNP in 10 days.  - Continue Farxiga  10 mg daily.  - Continue spironolactone  25 mg daily - Continue Lisinopril  20 mg daily 2. Pulmonary Hypertension:  RHC in 1/25 with PA mean 30 with PCWP 17 and PVR 2.25 WU.  Suspect primarily pulmonary venous hypertension (WHO group 2) but also WHO group 3 disease from OHS/OSA.  - Continue CPAP - Continue diuretics as above.   3. OSA: on CPAP. Reports full compliance. 4. HTN: BP controlled. 5. Obesity: Failed Ozempic and Mounjaro with nausea/vomiting.  - She is doing Weight Watchers.  - We discussed bariatric surgery.  She wants to avoid this. 6. Diabetes Type II - per PCP   Follow up with APP in 1 month.   I spent 31 minutes reviewing data, interviewing patient, and organizing the orders/followup.    Ezra Shuck,  11/20/2023

## 2023-11-30 ENCOUNTER — Ambulatory Visit (HOSPITAL_COMMUNITY)
Admission: RE | Admit: 2023-11-30 | Discharge: 2023-11-30 | Disposition: A | Discharge status: Home / Self Care | Source: Ambulatory Visit | Attending: Cardiology | Admitting: Cardiology

## 2023-11-30 DIAGNOSIS — I5032 Chronic diastolic (congestive) heart failure: Secondary | ICD-10-CM | POA: Diagnosis present

## 2023-11-30 LAB — BASIC METABOLIC PANEL WITH GFR
Anion gap: 13 (ref 5–15)
BUN: 10 mg/dL (ref 6–20)
CO2: 21 mmol/L — ABNORMAL LOW (ref 22–32)
Calcium: 8.6 mg/dL — ABNORMAL LOW (ref 8.9–10.3)
Chloride: 103 mmol/L (ref 98–111)
Creatinine, Ser: 0.55 mg/dL (ref 0.44–1.00)
GFR, Estimated: >60 mL/min (ref >60)
Glucose, Bld: 155 mg/dL — ABNORMAL HIGH (ref 70–99)
Potassium: 3.4 mmol/L — ABNORMAL LOW (ref 3.5–5.1)
Sodium: 137 mmol/L (ref 135–145)

## 2023-11-30 LAB — BRAIN NATRIURETIC PEPTIDE: B Natriuretic Peptide: 47.3 pg/mL (ref 0.0–100.0)

## 2023-12-01 ENCOUNTER — Ambulatory Visit (HOSPITAL_COMMUNITY): Payer: Self-pay | Admitting: Cardiology

## 2023-12-20 ENCOUNTER — Telehealth (HOSPITAL_COMMUNITY): Payer: Self-pay

## 2023-12-20 NOTE — Telephone Encounter (Signed)
 Called to confirm/remind patient of their appointment at the Advanced Heart Failure Clinic on 12/21/23.   Appointment:   [] Confirmed  [x] Left mess   [] No answer/No voice mail  [] VM Full/unable to leave message  [] Phone not in service  And to bring in all medications and/or complete list.

## 2023-12-21 ENCOUNTER — Ambulatory Visit (HOSPITAL_COMMUNITY): Payer: Self-pay | Admitting: Adult Health

## 2023-12-21 ENCOUNTER — Ambulatory Visit (HOSPITAL_COMMUNITY)
Admission: RE | Admit: 2023-12-21 | Discharge: 2023-12-21 | Disposition: A | Discharge status: Home / Self Care | Source: Ambulatory Visit | Attending: Adult Health | Admitting: Adult Health

## 2023-12-21 ENCOUNTER — Encounter (HOSPITAL_COMMUNITY): Payer: Self-pay

## 2023-12-21 VITALS — BP 110/70 | HR 84 | Wt 347.0 lb

## 2023-12-21 DIAGNOSIS — Z79899 Other long term (current) drug therapy: Secondary | ICD-10-CM | POA: Diagnosis not present

## 2023-12-21 DIAGNOSIS — E8770 Fluid overload, unspecified: Secondary | ICD-10-CM | POA: Insufficient documentation

## 2023-12-21 DIAGNOSIS — R0602 Shortness of breath: Secondary | ICD-10-CM | POA: Diagnosis not present

## 2023-12-21 DIAGNOSIS — Z7984 Long term (current) use of oral hypoglycemic drugs: Secondary | ICD-10-CM | POA: Insufficient documentation

## 2023-12-21 DIAGNOSIS — I272 Pulmonary hypertension, unspecified: Secondary | ICD-10-CM | POA: Diagnosis not present

## 2023-12-21 DIAGNOSIS — E119 Type 2 diabetes mellitus without complications: Secondary | ICD-10-CM | POA: Insufficient documentation

## 2023-12-21 DIAGNOSIS — I110 Hypertensive heart disease with heart failure: Secondary | ICD-10-CM | POA: Diagnosis not present

## 2023-12-21 DIAGNOSIS — I5032 Chronic diastolic (congestive) heart failure: Secondary | ICD-10-CM | POA: Insufficient documentation

## 2023-12-21 DIAGNOSIS — Z794 Long term (current) use of insulin: Secondary | ICD-10-CM | POA: Insufficient documentation

## 2023-12-21 DIAGNOSIS — G4733 Obstructive sleep apnea (adult) (pediatric): Secondary | ICD-10-CM | POA: Diagnosis not present

## 2023-12-21 LAB — BASIC METABOLIC PANEL WITH GFR
Anion gap: 15 (ref 5–15)
BUN: 12 mg/dL (ref 6–20)
CO2: 23 mmol/L (ref 22–32)
Calcium: 9.1 mg/dL (ref 8.9–10.3)
Chloride: 104 mmol/L (ref 98–111)
Creatinine, Ser: 0.87 mg/dL (ref 0.44–1.00)
GFR, Estimated: >60 mL/min (ref >60)
Glucose, Bld: 178 mg/dL — ABNORMAL HIGH (ref 70–99)
Potassium: 3.5 mmol/L (ref 3.5–5.1)
Sodium: 142 mmol/L (ref 135–145)

## 2023-12-21 LAB — BRAIN NATRIURETIC PEPTIDE: B Natriuretic Peptide: 109.2 pg/mL — ABNORMAL HIGH (ref 0.0–100.0)

## 2023-12-21 MED ORDER — POTASSIUM CHLORIDE CRYS ER 10 MEQ PO TBCR: 40.0000 meq | EXTENDED_RELEASE_TABLET | Freq: Every day | ORAL | 11 refills | Status: DC

## 2023-12-21 MED ORDER — METOLAZONE 2.5 MG PO TABS: 2.5000 mg | ORAL_TABLET | ORAL | 3 refills | Status: DC

## 2023-12-21 NOTE — Progress Notes (Signed)
 ADVANCED HF CLINIC NOTE  Primary Care: [facility]: Dr. Rolan  Chief Complaint: Heart Failure   HPI: 43 y.o. former smoker with h/o obesity, OSA on CPAP, HTN, Type 2 DM, lupus anticoagulant (in pregnancy), and diastolic heart failure.   Echo in 10/24 showed EF 60-65%, RV normal, trivial MR. Diuresed 16 lb w/ IV lasix , weight 349>333 lb. Started on GDMT. Referred to [facility] clinic at discharge and then referred to [facility] for RHC to better assess volume status and for [facility] w/u and consideration for CardioMEMs implant.   RHC 05/05/23 with R > L filling pressures and mild pHTN, preserved CO but low PAPi. Unable to successfully place cardiomems due to difficult access. Torsemide  increased.    Given Fursocix July 2025 x 2 doses the back to torsemide  80 mg /60 mg daily. She took a 3rd Fursocix last week.   Today she returns for HF follow up. Complaining of shortness of breath. Over whelmed with family issues and her own health issues. Denies/PND/Orthopnea. Drinking extra fluid. Appetite ok. No fever or chills. Weight at home trending up. Continues to use  CPAP.  Taking all medications. She has applied to disability.   Labs (4/25): BNP 26 Labs (7/25): K 4.2, creatinine 0.54  PMH: 1. OSA: uses CPAP 2. Morbid obesity: Unable to tolerate Ozempic or Mounjaro 3. Lupus anticoagulant noted in pregnancy.  4. HTN 5. HFpEF: Echo (10/24) with technically difficult windows, EF 60-65%, RV probably normal, unable to esimate PA systolic pressure.  - RHC 1/25: RA 13, PA 37/21 (30), PCWP mean 17, CO/CI (Fick) 5.76/2.32, PAPi 1.2, PVR 2.25 WU-unable to place Cardiomems 6. Type 2 diabetes  Review of Systems: All systems reviewed and negative except as per HPI.   FH: Mother with CHF, she does not know the details.   SH: Lives in [place], married, 2 children, prior smoker but quit 2017, no ETOH. Out of work.   Current Outpatient Medications  Medication Sig Dispense Refill    dapagliflozin  propanediol (FARXIGA ) 10 MG TABS tablet Take 1 tablet (10 mg total) by mouth daily. 30 tablet 3   famotidine  (PEPCID ) 10 MG tablet Take 10 mg by mouth daily as needed for heartburn or indigestion.     insulin  glargine (LANTUS ) 100 UNIT/ML injection Inject 90 Units into the skin at bedtime.     levocetirizine (XYZAL) 5 MG tablet Take 5 mg by mouth daily as needed for allergies.     lisinopril  (ZESTRIL ) 20 MG tablet Take 20 mg by mouth daily.     Melatonin 10 MG TABS Take 10 mg by mouth as needed.     Naphazoline HCl (CLEAR EYES OP) Place 1 drop into both eyes daily as needed (allergies).     NOVOLOG  FLEXPEN 100 UNIT/ML FlexPen Inject 30-50 Units into the skin 3 (three) times daily before meals. Per sliding scale     ondansetron  (ZOFRAN ) 8 MG tablet TAKE 1 TABLET (8 MG TOTAL) BY MOUTH 3 (THREE) TIMES DAILY AS NEEDED FOR NAUSEA OR VOMITING. 12 tablet 19   rizatriptan  (MAXALT -MLT) 10 MG disintegrating tablet Take 10 mg by mouth as needed for migraine.     rosuvastatin  (CRESTOR ) 40 MG tablet Take 40 mg by mouth daily.     spironolactone  (ALDACTONE ) 25 MG tablet Take 1 tablet (25 mg total) by mouth daily. 30 tablet 0   torsemide  (DEMADEX ) 20 MG tablet Take 4 tablets (80 mg total) by mouth every morning AND 3 tablets (60 mg total) every evening.  200 tablet 3   oxycodone  (OXY-IR) 5 MG capsule Take 5 mg by mouth as needed. (Patient not taking: Reported on 12/21/2023)     potassium chloride  (KLOR-CON  M) 10 MEQ tablet Take 4 tablets (40 mEq total) by mouth daily. (Patient not taking: Reported on 12/21/2023) 120 tablet 11   No current facility-administered medications for this encounter.    Allergies  Allergen Reactions   Augmentin  [Amoxicillin -Pot Clavulanate] Anaphylaxis    Tolerated cefazolin  on multiple occasions    Amoxicillin  Nausea And Vomiting        Metformin  And Related Nausea And Vomiting   Ozempic (0.25 Or 0.5 Mg-Dose) [Semaglutide(0.25 Or 0.5mg -Dos)] Nausea And Vomiting    Trulicity [Dulaglutide] Nausea And Vomiting    Vitals:   12/21/23 1218  BP: 110/70  Pulse: 84  SpO2: 97%  Weight: (!) 157.4 kg (347 lb)   Wt Readings from Last 3 Encounters:  12/21/23 (!) 157.4 kg (347 lb)  11/08/23 (!) 150.6 kg (332 lb)  08/16/23 (!) 156.9 kg (346 lb)   PHYSICAL EXAM: General:   No resp difficulty Neck: JVP difficult to assess due to body habitus. Cor: Regular rate & rhythm.  Lungs: clear Abdomen: soft, nontender, nondistended.  Extremities: R and LLE 1-2+ edema. RUE splint.  Neuro: alert & oriented x3  ASSESSMENT & PLAN:  1. HFpEF: Echo 10/24 60-65%, RV normal, trivial MR. Attempted cardiomems placement but failed due to very difficult access. RHC 1/25 with elevated filling pressures R>L with preserved CO and low PAPi.  -NYHA III.  - Volume status elevated. Suspect volume overloaded in the setting of high sodium diet and fluid intake. I think we need to increase diuretic regimen with addition of metolazone .  - Continue torsemide  80 qam/60 qpm. Check BMET an BNP  - Add metolazone  2.5 mg Monday and Friday and extra 40 meq KDur   - Continue Farxiga  10 mg daily.  - Continue spironolactone  25 mg daily - Continue Lisinopril  20 mg daily - Check BMET  2. Pulmonary Hypertension:  RHC in 1/25 with PA mean 30 with PCWP 17 and PVR 2.25 WU.  Suspect primarily pulmonary venous hypertension (WHO group 2) but also WHO group 3 disease from OHS/OSA.  - Continue CPAP nightly.  -Volume overloaded today. As above adding metolazone .   3. OSA: on CPAP. Continue nightly.  4. HTN: BP controlled. 5. Obesity: Failed Ozempic and Mounjaro with nausea/vomiting.  - She is doing Weight Watchers.  - Discussed portion control.  6. Diabetes Type II - per PCP   Follow up in 2 weeks to reassess volume.    Jeannine Pennisi NP-C  12/21/2023

## 2023-12-21 NOTE — Patient Instructions (Addendum)
 Medication Changes:  Take Metolazone  2.5 mg twice a week Monday & Friday .  Take extra 40 meq KDUR (potassium) on Monday and Friday.   Lab Work:  Labs done today, your results will be available in MyChart, we will contact you for abnormal readings.  Follow-Up in: 2 weeks as scheduled with APP   At the Advanced Heart Failure Clinic, you and your health needs are our priority. We have a designated team specialized in the treatment of Heart Failure. This Care Team includes your primary Heart Failure Specialized Cardiologist (physician), Advanced Practice Providers (APPs- Physician Assistants and Nurse Practitioners), and Pharmacist who all work together to provide you with the care you need, when you need it.   You may see any of the following providers on your designated Care Team at your next follow up:  Dr. Toribio Fuel Dr. Ezra Shuck Dr. Ria Commander Dr. Odis Brownie Greig Mosses, NP Caffie Shed, [place] [facility] [place], [place] Beckey Coe, NP [place] Lee, NP Tinnie Redman, PharmD   Please be sure to bring in all your medications bottles to every appointment.   Need to Contact Us :  If you have any questions or concerns before your next appointment please send us  a message through [place] or call our office at [phone].    TO LEAVE A MESSAGE FOR THE NURSE SELECT OPTION 2, PLEASE LEAVE A MESSAGE INCLUDING: YOUR NAME DATE OF BIRTH CALL BACK NUMBER REASON FOR CALL**this is important as we prioritize the call backs  YOU WILL RECEIVE A CALL BACK THE SAME DAY AS LONG AS YOU CALL BEFORE 4:00 PM

## 2023-12-24 ENCOUNTER — Other Ambulatory Visit (HOSPITAL_COMMUNITY): Payer: Self-pay | Admitting: Family Medicine

## 2024-01-03 ENCOUNTER — Telehealth (HOSPITAL_COMMUNITY): Payer: Self-pay

## 2024-01-03 NOTE — Telephone Encounter (Signed)
 Called to confirm/remind patient of their appointment at the Advanced Heart Failure Clinic on 01/04/24.   Appointment:   [x] Confirmed  [] Left mess   [] No answer/No voice mail  [] VM Full/unable to leave message  [] Phone not in service  Patient reminded to bring all medications and/or complete list.  Confirmed patient has transportation. Gave directions, instructed to utilize valet parking.

## 2024-01-03 NOTE — Progress Notes (Signed)
 ADVANCED HF CLINIC NOTE  Primary Care: Practice, [facility] HF Cardiology: Dr. Rolan  Chief Complaint: f/u for HFpEF   HPI: 43 y.o. former smoker with h/o obesity, OSA on CPAP, HTN, Type 2 DM, lupus anticoagulant (in pregnancy), and diastolic heart failure.   Echo in 10/24 showed EF 60-65%, RV normal, trivial MR. Diuresed 16 lb w/ IV lasix , weight 349>333 lb. Started on GDMT. Referred to [facility] clinic at discharge and then referred to [facility] for RHC to better assess volume status and for [facility] w/u and consideration for CardioMEMs implant.   RHC 05/05/23 with R > L filling pressures and mild pHTN, preserved CO but low PAPi. Unable to successfully place cardiomems due to difficult access. Torsemide  increased.    Given Fursocix July 2025 x 2 doses the back to torsemide  80 mg /60 mg daily. She took a 3rd Fursocix last week.   Seen back last wk for f/u. She was volume overloaded w/ NYHA Class III symptoms. Metolazone  added to regimen, instructed to take twice weeky, every Monday and Friday.   She presents back today to reassess volume status. She reports improved urine output on metolazone  days. Wt trending down, down 5 lb since last visit. No resting dyspnea. Reports stable NYHA class III symptoms. C/w abdominal distention and mild LEE. Also w/ leg cramps, worse the day after metolazone . Reports compliance w/ KCl. She has been fluid restricting and watching salt intake. Compliant w/ CPAP.    Labs (4/25): BNP 26 Labs (7/25): K 4.2, creatinine 0.54 Labs (8/25): Scr 0.87, K 3.5   PMH: 1. OSA: uses CPAP 2. Morbid obesity: Unable to tolerate Ozempic or Mounjaro 3. Lupus anticoagulant noted in pregnancy.  4. HTN 5. HFpEF: Echo (10/24) with technically difficult windows, EF 60-65%, RV probably normal, unable to esimate PA systolic pressure.  - RHC 1/25: RA 13, PA 37/21 (30), PCWP mean 17, CO/CI (Fick) 5.76/2.32, PAPi 1.2, PVR 2.25 WU-unable to place Cardiomems 6. Type 2 diabetes  Review  of Systems: All systems reviewed and negative except as per HPI.   FH: Mother with CHF, she does not know the details.   SH: Lives in [place], married, 2 children, prior smoker but quit 2017, no ETOH. Out of work.   Current Outpatient Medications  Medication Sig Dispense Refill   dapagliflozin  propanediol (FARXIGA ) 10 MG TABS tablet Take 1 tablet (10 mg total) by mouth daily. 30 tablet 3   famotidine  (PEPCID ) 10 MG tablet Take 10 mg by mouth daily as needed for heartburn or indigestion.     insulin  glargine (LANTUS ) 100 UNIT/ML injection Inject 90 Units into the skin at bedtime.     levocetirizine (XYZAL) 5 MG tablet Take 5 mg by mouth daily as needed for allergies.     lisinopril  (ZESTRIL ) 20 MG tablet Take 20 mg by mouth daily.     Melatonin 10 MG TABS Take 10 mg by mouth as needed.     metolazone  (ZAROXOLYN ) 2.5 MG tablet Take 1 tablet (2.5 mg total) by mouth 2 (two) times a week. Take 1 tablet on Monday and Friday 8 tablet 3   Naphazoline HCl (CLEAR EYES OP) Place 1 drop into both eyes daily as needed (allergies).     NOVOLOG  FLEXPEN 100 UNIT/ML FlexPen Inject 30-50 Units into the skin 3 (three) times daily before meals. Per sliding scale     ondansetron  (ZOFRAN ) 8 MG tablet TAKE 1 TABLET (8 MG TOTAL) BY MOUTH 3 (THREE) TIMES DAILY AS NEEDED FOR NAUSEA  OR VOMITING. 12 tablet 19   oxycodone  (OXY-IR) 5 MG capsule Take 5 mg by mouth as needed.     potassium chloride  (KLOR-CON  M) 10 MEQ tablet Take 4 tablets (40 mEq total) by mouth daily. Take an additional 40meq on Monday & Friday with Metolazone  120 tablet 11   rizatriptan  (MAXALT -MLT) 10 MG disintegrating tablet Take 10 mg by mouth as needed for migraine.     rosuvastatin  (CRESTOR ) 40 MG tablet Take 40 mg by mouth daily.     spironolactone  (ALDACTONE ) 25 MG tablet Take 1 tablet (25 mg total) by mouth daily. 30 tablet 0   torsemide  (DEMADEX ) 20 MG tablet TAKE 4 TABLETS (80 MG TOTAL) BY MOUTH EVERY MORNING AND 3 TABLETS (60 MG TOTAL) EVERY  EVENING. 600 tablet 1   No current facility-administered medications for this encounter.    Allergies  Allergen Reactions   Augmentin  [Amoxicillin -Pot Clavulanate] Anaphylaxis    Tolerated cefazolin  on multiple occasions    Amoxicillin  Nausea And Vomiting        Metformin  And Related Nausea And Vomiting   Ozempic (0.25 Or 0.5 Mg-Dose) [Semaglutide(0.25 Or 0.5mg -Dos)] Nausea And Vomiting   Trulicity [Dulaglutide] Nausea And Vomiting    Vitals:   01/04/24 1207  BP: 120/80  Pulse: 97  SpO2: 97%  Weight: (!) 155.5 kg (342 lb 12.8 oz)  Height: 5' 6 (1.676 m)    Wt Readings from Last 3 Encounters:  01/04/24 (!) 155.5 kg (342 lb 12.8 oz)  12/21/23 (!) 157.4 kg (347 lb)  11/08/23 (!) 150.6 kg (332 lb)   Physical Exam  GENERAL: super morbidly obese, NAD Lungs- clear  CARDIAC:  thick neck, JVD not well visualized          Normal rate with regular rhythm. Trace b/l pretibial edema  ABDOMEN: obese, mildly distended. Soft, non-tender EXTREMITIES: Warm and well perfused.  NEUROLOGIC: No obvious FND   ASSESSMENT & PLAN:  1. HFpEF: Echo 10/24 60-65%, RV normal, trivial MR. Attempted cardiomems placement but failed due to very difficult access. RHC 1/25 with elevated filling pressures R>L with preserved CO and low PAPi.  -NYHA III, chronic and confounded by obesity and deconditioning  - Volume status improving but remains mildly volume overloaded.  - Increase torsemide  to 80 mg bid x 2 days then back to 80 qam/60 mg qpm - Continue twice weekly metolazone , 2.5 mg q Monday's and Friday's w/ extra 40 meq KDur  - Continue Farxiga  10 mg daily. Denies GU symptoms  - Continue spironolactone  25 mg daily - Continue Lisinopril  20 mg daily - Check BMP and BNP today  2. Pulmonary Hypertension:  RHC in 1/25 with PA mean 30 with PCWP 17 and PVR 2.25 WU.  Suspect primarily pulmonary venous hypertension (WHO group 2) but also WHO group 3 disease from OHS/OSA.  - Continue CPAP nightly.  3. OSA:  reports compliance w/ CPAP. Continue nightly.  4. HTN: controlled - continue current regimen  5. Obesity: Failed Ozempic and Mounjaro with nausea/vomiting.  - She is doing Weight Watchers.  6. Diabetes Type II - per PCP  - on insulin , Farxiga  + statin  F/u in 3 months w/ APP    Zaelyn Barbary PA-C  01/04/2024

## 2024-01-04 ENCOUNTER — Encounter (HOSPITAL_COMMUNITY): Payer: Self-pay

## 2024-01-04 ENCOUNTER — Ambulatory Visit (HOSPITAL_COMMUNITY)
Admission: RE | Admit: 2024-01-04 | Discharge: 2024-01-04 | Disposition: A | Discharge status: Home / Self Care | Source: Ambulatory Visit | Attending: Cardiology | Admitting: Cardiology

## 2024-01-04 ENCOUNTER — Ambulatory Visit (HOSPITAL_COMMUNITY): Payer: Self-pay | Admitting: Cardiology

## 2024-01-04 VITALS — BP 120/80 | HR 97 | Ht 66.0 in | Wt 342.8 lb

## 2024-01-04 DIAGNOSIS — I110 Hypertensive heart disease with heart failure: Secondary | ICD-10-CM | POA: Insufficient documentation

## 2024-01-04 DIAGNOSIS — Z794 Long term (current) use of insulin: Secondary | ICD-10-CM | POA: Diagnosis not present

## 2024-01-04 DIAGNOSIS — I272 Pulmonary hypertension, unspecified: Secondary | ICD-10-CM | POA: Diagnosis not present

## 2024-01-04 DIAGNOSIS — Z7984 Long term (current) use of oral hypoglycemic drugs: Secondary | ICD-10-CM | POA: Diagnosis not present

## 2024-01-04 DIAGNOSIS — E119 Type 2 diabetes mellitus without complications: Secondary | ICD-10-CM | POA: Diagnosis not present

## 2024-01-04 DIAGNOSIS — Z79899 Other long term (current) drug therapy: Secondary | ICD-10-CM | POA: Diagnosis not present

## 2024-01-04 DIAGNOSIS — G4733 Obstructive sleep apnea (adult) (pediatric): Secondary | ICD-10-CM | POA: Diagnosis not present

## 2024-01-04 DIAGNOSIS — Z6841 Body Mass Index (BMI) 40.0 and over, adult: Secondary | ICD-10-CM | POA: Insufficient documentation

## 2024-01-04 DIAGNOSIS — I5032 Chronic diastolic (congestive) heart failure: Secondary | ICD-10-CM | POA: Insufficient documentation

## 2024-01-04 LAB — BASIC METABOLIC PANEL WITH GFR
Anion gap: 17 — ABNORMAL HIGH (ref 5–15)
BUN: 15 mg/dL (ref 6–20)
CO2: 21 mmol/L — ABNORMAL LOW (ref 22–32)
Calcium: 9.4 mg/dL (ref 8.9–10.3)
Chloride: 101 mmol/L (ref 98–111)
Creatinine, Ser: 0.66 mg/dL (ref 0.44–1.00)
GFR, Estimated: >60 mL/min (ref >60)
Glucose, Bld: 228 mg/dL — ABNORMAL HIGH (ref 70–99)
Potassium: 4.2 mmol/L (ref 3.5–5.1)
Sodium: 139 mmol/L (ref 135–145)

## 2024-01-04 LAB — BRAIN NATRIURETIC PEPTIDE: B Natriuretic Peptide: 11.9 pg/mL (ref 0.0–100.0)

## 2024-01-04 NOTE — Patient Instructions (Addendum)
 Thank you for coming in today  If you had labs drawn today, any labs that are abnormal the clinic will call you No news is good news  Medications: No changes  Follow up appointments:  Your physician recommends that you schedule a follow-up appointment in:  3 months in clinic   Do the following things EVERYDAY: Weigh yourself in the morning before breakfast. Write it down and keep it in a log. Take your medicines as prescribed Eat low salt foods--Limit salt (sodium) to 2000 mg per day.  Stay as active as you can everyday Limit all fluids for the day to less than 2 liters   At the [facility], you and your health needs are our priority. As part of our continuing mission to provide you with exceptional heart care, we have created designated Provider Care Teams. These Care Teams include your primary Cardiologist (physician) and Advanced Practice Providers (APPs- Physician Assistants and Nurse Practitioners) who all work together to provide you with the care you need, when you need it.   You may see any of the following providers on your designated Care Team at your next follow up: Dr Arvilla Meres Dr Marca Ancona Dr. Marcos Eke, NP Robbie Lis, [place] [facility] [place], [place] Brynda Peon, NP Karle Plumber, PharmD   Please be sure to bring in all your medications bottles to every appointment.    Thank you for choosing [facility]  If you have any questions or concerns before your next appointment please send [place] a message through [place] or call our office at [phone].    TO LEAVE A MESSAGE FOR THE NURSE SELECT OPTION 2, PLEASE LEAVE A MESSAGE INCLUDING: YOUR NAME DATE OF BIRTH CALL BACK NUMBER REASON FOR CALL**this is important as we prioritize the call backs  YOU WILL RECEIVE A CALL BACK THE SAME DAY AS LONG AS YOU CALL BEFORE 4:00 PM

## 2024-01-05 ENCOUNTER — Other Ambulatory Visit (HOSPITAL_COMMUNITY): Payer: Self-pay | Admitting: Cardiology

## 2024-01-05 MED ORDER — SPIRONOLACTONE 25 MG PO TABS: 25.0000 mg | ORAL_TABLET | Freq: Every day | ORAL | 11 refills | Status: AC

## 2024-01-08 ENCOUNTER — Other Ambulatory Visit (HOSPITAL_COMMUNITY): Payer: Self-pay

## 2024-01-08 ENCOUNTER — Encounter (HOSPITAL_COMMUNITY): Payer: Self-pay

## 2024-01-08 DIAGNOSIS — I5032 Chronic diastolic (congestive) heart failure: Secondary | ICD-10-CM

## 2024-01-08 MED ORDER — TORSEMIDE 100 MG PO TABS: 100.0000 mg | ORAL_TABLET | Freq: Two times a day (BID) | ORAL | 3 refills | Status: AC

## 2024-01-08 MED ORDER — POTASSIUM CHLORIDE CRYS ER 10 MEQ PO TBCR: 40.0000 meq | EXTENDED_RELEASE_TABLET | Freq: Two times a day (BID) | ORAL | 11 refills | Status: DC

## 2024-01-15 ENCOUNTER — Other Ambulatory Visit (HOSPITAL_COMMUNITY)

## 2024-01-19 ENCOUNTER — Ambulatory Visit (HOSPITAL_COMMUNITY)
Admission: RE | Admit: 2024-01-19 | Discharge: 2024-01-19 | Disposition: A | Discharge status: Home / Self Care | Source: Ambulatory Visit | Attending: Internal Medicine | Admitting: Internal Medicine

## 2024-01-19 ENCOUNTER — Ambulatory Visit (HOSPITAL_COMMUNITY): Payer: Self-pay | Admitting: Family Medicine

## 2024-01-19 DIAGNOSIS — I5032 Chronic diastolic (congestive) heart failure: Secondary | ICD-10-CM | POA: Insufficient documentation

## 2024-01-19 LAB — MAGNESIUM: Magnesium: 2.2 mg/dL (ref 1.7–2.4)

## 2024-01-19 LAB — BASIC METABOLIC PANEL WITH GFR
Anion gap: 12 (ref 5–15)
BUN: 15 mg/dL (ref 6–20)
CO2: 23 mmol/L (ref 22–32)
Calcium: 9.1 mg/dL (ref 8.9–10.3)
Chloride: 101 mmol/L (ref 98–111)
Creatinine, Ser: 0.66 mg/dL (ref 0.44–1.00)
GFR, Estimated: >60 mL/min (ref >60)
Glucose, Bld: 202 mg/dL — ABNORMAL HIGH (ref 70–99)
Potassium: 4.1 mmol/L (ref 3.5–5.1)
Sodium: 136 mmol/L (ref 135–145)

## 2024-01-31 ENCOUNTER — Other Ambulatory Visit (HOSPITAL_COMMUNITY): Payer: Self-pay | Admitting: Cardiology

## 2024-01-31 MED ORDER — DAPAGLIFLOZIN PROPANEDIOL 10 MG PO TABS: 10.0000 mg | ORAL_TABLET | Freq: Every day | ORAL | 3 refills | Status: AC

## 2024-02-06 ENCOUNTER — Telehealth (HOSPITAL_COMMUNITY): Payer: Self-pay

## 2024-02-06 ENCOUNTER — Other Ambulatory Visit (HOSPITAL_COMMUNITY): Payer: Self-pay

## 2024-02-06 NOTE — Telephone Encounter (Signed)
 Advanced Heart Failure Patient Advocate Encounter  Prior authorization for Farxiga  has been submitted and approved. Test billing returns $4 for 90 day supply.  Key: [id number] Effective: 02/07/2024 to 02/06/2025  Rachel DEL, CPhT Rx Patient Advocate Phone: [phone]

## 2024-02-07 ENCOUNTER — Other Ambulatory Visit (HOSPITAL_COMMUNITY): Payer: Self-pay

## 2024-03-04 ENCOUNTER — Encounter (HOSPITAL_COMMUNITY): Payer: Self-pay

## 2024-04-08 ENCOUNTER — Telehealth (HOSPITAL_COMMUNITY): Payer: Self-pay

## 2024-04-08 NOTE — Telephone Encounter (Signed)
 Called to confirm/remind patient of their appointment at the [facility] Clinic on 04/09/24.   Appointment:   [x] Confirmed  [] Left mess   [] No answer/No voice mail  [] VM Full/unable to leave message  [] Phone not in service  Patient reminded to bring all medications and/or complete list.  Confirmed patient has transportation. Gave directions, instructed to utilize valet parking.

## 2024-04-09 ENCOUNTER — Encounter (HOSPITAL_COMMUNITY)

## 2024-04-15 ENCOUNTER — Telehealth (HOSPITAL_COMMUNITY): Payer: Self-pay

## 2024-04-15 NOTE — Telephone Encounter (Signed)
 Called to confirm/remind patient of their appointment at the [facility] Clinic on 04/16/24 8:30.   Appointment:   [] Confirmed  [x] Left mess   [] No answer/No voice mail  [] VM Full/unable to leave message  [] Phone not in service  Patient reminded to bring all medications and/or complete list.  Confirmed patient has transportation. Gave directions, instructed to utilize valet parking.

## 2024-04-16 ENCOUNTER — Encounter (HOSPITAL_COMMUNITY): Payer: Self-pay

## 2024-04-16 ENCOUNTER — Ambulatory Visit (HOSPITAL_COMMUNITY): Admission: RE | Admit: 2024-04-16 | Discharge: 2024-04-16 | Attending: Cardiology

## 2024-04-16 ENCOUNTER — Ambulatory Visit (HOSPITAL_COMMUNITY): Payer: Self-pay | Admitting: Cardiology

## 2024-04-16 VITALS — BP 101/50 | HR 86 | Wt 341.4 lb

## 2024-04-16 DIAGNOSIS — E119 Type 2 diabetes mellitus without complications: Secondary | ICD-10-CM | POA: Insufficient documentation

## 2024-04-16 DIAGNOSIS — Z79899 Other long term (current) drug therapy: Secondary | ICD-10-CM | POA: Diagnosis not present

## 2024-04-16 DIAGNOSIS — I5032 Chronic diastolic (congestive) heart failure: Secondary | ICD-10-CM | POA: Insufficient documentation

## 2024-04-16 DIAGNOSIS — G4733 Obstructive sleep apnea (adult) (pediatric): Secondary | ICD-10-CM | POA: Insufficient documentation

## 2024-04-16 DIAGNOSIS — Z794 Long term (current) use of insulin: Secondary | ICD-10-CM | POA: Insufficient documentation

## 2024-04-16 DIAGNOSIS — I110 Hypertensive heart disease with heart failure: Secondary | ICD-10-CM | POA: Diagnosis not present

## 2024-04-16 DIAGNOSIS — Z7984 Long term (current) use of oral hypoglycemic drugs: Secondary | ICD-10-CM | POA: Insufficient documentation

## 2024-04-16 LAB — BASIC METABOLIC PANEL WITH GFR
Anion gap: 11 (ref 5–15)
BUN: 21 mg/dL — ABNORMAL HIGH (ref 6–20)
CO2: 23 mmol/L (ref 22–32)
Calcium: 9.4 mg/dL (ref 8.9–10.3)
Chloride: 102 mmol/L (ref 98–111)
Creatinine, Ser: 0.61 mg/dL (ref 0.44–1.00)
GFR, Estimated: >60 mL/min (ref >60)
Glucose, Bld: 157 mg/dL — ABNORMAL HIGH (ref 70–99)
Potassium: 4.5 mmol/L (ref 3.5–5.1)
Sodium: 135 mmol/L (ref 135–145)

## 2024-04-16 MED ORDER — POTASSIUM CHLORIDE CRYS ER 20 MEQ PO TBCR: 40.0000 meq | EXTENDED_RELEASE_TABLET | Freq: Two times a day (BID) | ORAL | 5 refills | Status: AC

## 2024-04-16 MED ORDER — METOLAZONE 2.5 MG PO TABS: 2.5000 mg | ORAL_TABLET | ORAL | 1 refills | Status: AC | PRN

## 2024-04-16 NOTE — Progress Notes (Signed)
 ADVANCED HF CLINIC NOTE  Primary Care: Practice, [facility]: Dr. Rolan  Chief Complaint: f/u for HFpEF   HPI: 43 y.o. former smoker with h/o obesity, OSA on CPAP, HTN, Type 2 DM, lupus anticoagulant (in pregnancy), and diastolic heart failure.   Echo in 10/24 showed EF 60-65%, RV normal, trivial MR. Diuresed 16 lb w/ IV lasix , weight 349>333 lb. Started on GDMT. Referred to [facility] clinic at discharge and then referred to [facility] for RHC to better assess volume status and for [facility] w/u and consideration for CardioMEMs implant.   RHC 05/05/23 with R > L filling pressures and mild pHTN, preserved CO but low PAPi. Unable to successfully place cardiomems due to difficult access. Torsemide  increased.    Given Fursocix July 2025 x 2 doses the back to torsemide  80 mg /60 mg daily. She took a 3rd Fursocix last week.   F/u 8/25, she was volume overloaded w/ NYHA Class III symptoms. Metolazone  added to regimen, instructed to take twice weeky, every Monday and Friday. Volume status had improved on subsequent f/u visit.   She presents back today for 3 month f/u. She reports since last visit, she had to stop scheduled metolazone  d/t severe muscle cramps and her torsemide  was increased to 100 mg bid. She has tolerated this regimen better. Wt is down 6 lb since last visit. She denies resting dyspnea and stable NYHA Class II-III symptoms, confounded by obesity/deconditioning. She reports full med compliance. BP well controlled. Compliant w/ CPAP.    Labs (4/25): BNP 26 Labs (7/25): K 4.2, creatinine 0.54 Labs (8/25): Scr 0.87, K 3.5  Labs (9/25): Scr 0.66, K 4.1    PMH: 1. OSA: uses CPAP 2. Morbid obesity: Unable to tolerate Ozempic or Mounjaro 3. Lupus anticoagulant noted in pregnancy.  4. HTN 5. HFpEF: Echo (10/24) with technically difficult windows, EF 60-65%, RV probably normal, unable to esimate PA systolic pressure.  - RHC 1/25: RA 13, PA 37/21 (30), PCWP mean 17, CO/CI  (Fick) 5.76/2.32, PAPi 1.2, PVR 2.25 WU-unable to place Cardiomems 6. Type 2 diabetes  Review of Systems: All systems reviewed and negative except as per HPI.   FH: Mother with CHF, she does not know the details.   SH: Lives in [place], married, 2 children, prior smoker but quit 2017, no ETOH. Out of work.   Current Outpatient Medications  Medication Sig Dispense Refill   dapagliflozin  propanediol (FARXIGA ) 10 MG TABS tablet Take 1 tablet (10 mg total) by mouth daily. 30 tablet 3   famotidine  (PEPCID ) 10 MG tablet Take 10 mg by mouth daily as needed for heartburn or indigestion.     insulin  glargine (LANTUS ) 100 UNIT/ML injection Inject 90 Units into the skin at bedtime.     levocetirizine (XYZAL) 5 MG tablet Take 5 mg by mouth daily as needed for allergies.     lisinopril  (ZESTRIL ) 20 MG tablet Take 20 mg by mouth daily.     Melatonin 10 MG TABS Take 10 mg by mouth as needed.     Naphazoline HCl (CLEAR EYES OP) Place 1 drop into both eyes daily as needed (allergies).     NOVOLOG  FLEXPEN 100 UNIT/ML FlexPen Inject 30-50 Units into the skin 3 (three) times daily before meals. Per sliding scale     ondansetron  (ZOFRAN ) 8 MG tablet TAKE 1 TABLET (8 MG TOTAL) BY MOUTH 3 (THREE) TIMES DAILY AS NEEDED FOR NAUSEA OR VOMITING. 12 tablet 19   oxycodone  (OXY-IR) 5 MG capsule Take  5 mg by mouth as needed.     potassium chloride  (KLOR-CON  M) 10 MEQ tablet Take 4 tablets (40 mEq total) by mouth 2 (two) times daily. Take an additional 40meq on Monday & Friday with Metolazone  120 tablet 11   rizatriptan  (MAXALT -MLT) 10 MG disintegrating tablet Take 10 mg by mouth as needed for migraine.     rosuvastatin  (CRESTOR ) 40 MG tablet Take 40 mg by mouth daily.     spironolactone  (ALDACTONE ) 25 MG tablet Take 1 tablet (25 mg total) by mouth daily. 30 tablet 11   torsemide  (DEMADEX ) 100 MG tablet Take 1 tablet (100 mg total) by mouth 2 (two) times daily. 180 tablet 3   metolazone  (ZAROXOLYN ) 2.5 MG tablet Take 1  tablet (2.5 mg total) by mouth 2 (two) times a week. Take 1 tablet on Monday and Friday (Patient not taking: Reported on 04/16/2024) 8 tablet 3   No current facility-administered medications for this encounter.    Allergies  Allergen Reactions   Augmentin  [Amoxicillin -Pot Clavulanate] Anaphylaxis    Tolerated cefazolin  on multiple occasions    Amoxicillin  Nausea And Vomiting        Metformin  And Related Nausea And Vomiting   Ozempic (0.25 Or 0.5 Mg-Dose) [Semaglutide(0.25 Or 0.5mg -Dos)] Nausea And Vomiting   Trulicity [Dulaglutide] Nausea And Vomiting    Vitals:   04/16/24 0828  BP: (!) 101/50  Pulse: 86  SpO2: 98%  Weight: (!) 154.9 kg (341 lb 6.4 oz)     Wt Readings from Last 3 Encounters:  04/16/24 (!) 154.9 kg (341 lb 6.4 oz)  01/04/24 (!) 155.5 kg (342 lb 12.8 oz)  12/21/23 (!) 157.4 kg (347 lb)   Physical Exam  GENERAL: super morbid obesity, NAD Lungs- clear  CARDIAC:  thick neck, unable to visualize JVD          Normal rate with regular rhythm. No MRG. No LEE  ABDOMEN: Soft, non-tender, non-distended.  EXTREMITIES: Warm and well perfused.  NEUROLOGIC: No obvious FND    ASSESSMENT & PLAN:  1. HFpEF: Echo 10/24 60-65%, RV normal, trivial MR. Attempted cardiomems placement but failed due to very difficult access. RHC 1/25 with elevated filling pressures R>L with preserved CO and low PAPi. Volume assessment difficulty due to body habitus/obesity but no LEE on exam and wt is down 6 lb from previous OV. Stable NYHA Class II-III, confounded by morbid obesity and deconditioning.  - Continue Torsemide  100 mg bid. Check BMP and BNP today  - Continue metolazone , 2.5 mg q weekly, PRN w/ Extra 40 mEq of KCL if > 5 lb wt gain in 1 wk  - Continue Farxiga  10 mg daily.   - Continue spironolactone  25 mg daily - Continue Lisinopril  20 mg daily 2. Pulmonary Hypertension:  RHC in 1/25 with PA mean 30 with PCWP 17 and PVR 2.25 WU.  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  - Continue CPAP nightly.  - Continue diuretics per above  3. OSA: compliant w/ CPAP. Continue nighty use  4. HTN: well controlled.  - continue current regimen  5. Obesity: Failed Ozempic and Mounjaro with nausea/vomiting.  - She is doing Weight Watchers.  6. Diabetes Type II - per PCP  - on insulin , Farxiga  + statin  F/u w/ Dr. Lonni in 3 months. F/u w/ Dr. Rolan in 6-9 months.    Caffie Shed PA-C  04/16/2024

## 2024-04-16 NOTE — Addendum Note (Signed)
 Encounter addended by: Revanth Neidig B, RN on: 04/16/2024 12:48 PM  Actions taken: Clinical Note Signed

## 2024-04-16 NOTE — Patient Instructions (Addendum)
 Medication Changes:  TAKE METOLAZONE  2.5MG  ONCE WEEKLY ONLY AS NEEDED FOR WEIGHT GAIN OF 5 POUNDS IN 1 WEEK---take extra POTASSIUM WITH THIS   Lab Work:  Labs done today, your results will be available in MyChart, we will contact you for abnormal readings.  Follow-Up in: 3 MONTHS WITH DR. LONNI (PLEASE CALL [phone] TO SCHEDULE THIS)   THEN FOLLOW UP WITH DR. ROLAN IN 6-9 MONTHS PLEASE CALL OUR OFFICE AROUND MAY/JUNE 2026  TO GET SCHEDULED FOR YOUR APPOINTMENT. PHONE NUMBER IS [phone] OPTION 2   At the [facility], you and your health needs are our priority. We have a designated team specialized in the treatment of Heart Failure. This Care Team includes your primary Heart Failure Specialized Cardiologist (physician), Advanced Practice Providers (APPs- Physician Assistants and Nurse Practitioners), and Pharmacist who all work together to provide you with the care you need, when you need it.   You may see any of the following providers on your designated Care Team at your next follow up:  Dr. Toribio Fuel Dr. Ezra Rolan Dr. Odis Brownie Greig Mosses, NP Caffie Shed, [place] [facility] [place], [place] Beckey Coe, NP Jordan Lee, NP Tinnie Redman, PharmD   Please be sure to bring in all your medications bottles to every appointment.   Need to Contact Us :  If you have any questions or concerns before your next appointment please send us  a message through [place] or call our office at [phone].    TO LEAVE A MESSAGE FOR THE NURSE SELECT OPTION 2, PLEASE LEAVE A MESSAGE INCLUDING: YOUR NAME DATE OF BIRTH CALL BACK NUMBER REASON FOR CALL**this is important as we prioritize the call backs  YOU WILL RECEIVE A CALL BACK THE SAME DAY AS LONG AS YOU CALL BEFORE 4:00 PM

## 2024-05-27 ENCOUNTER — Other Ambulatory Visit: Payer: Self-pay | Admitting: Internal Medicine
